# Patient Record
Sex: Female | Born: 1935
Health system: Southern US, Community
[De-identification: ages and names within clinical notes are randomized; demographics above are authoritative.]

## PROBLEM LIST (undated history)

## (undated) DIAGNOSIS — D329 Benign neoplasm of meninges, unspecified: Secondary | ICD-10-CM

## (undated) DIAGNOSIS — R112 Nausea with vomiting, unspecified: Secondary | ICD-10-CM

## (undated) DIAGNOSIS — N289 Disorder of kidney and ureter, unspecified: Secondary | ICD-10-CM

## (undated) DIAGNOSIS — K859 Acute pancreatitis without necrosis or infection, unspecified: Secondary | ICD-10-CM

## (undated) DIAGNOSIS — K5790 Diverticulosis of intestine, part unspecified, without perforation or abscess without bleeding: Secondary | ICD-10-CM

## (undated) DIAGNOSIS — K219 Gastro-esophageal reflux disease without esophagitis: Secondary | ICD-10-CM

## (undated) DIAGNOSIS — Z9889 Other specified postprocedural states: Secondary | ICD-10-CM

## (undated) DIAGNOSIS — K589 Irritable bowel syndrome without diarrhea: Secondary | ICD-10-CM

## (undated) DIAGNOSIS — I10 Essential (primary) hypertension: Secondary | ICD-10-CM

## (undated) DIAGNOSIS — M4807 Spinal stenosis, lumbosacral region: Secondary | ICD-10-CM

## (undated) DIAGNOSIS — M1380 Other specified arthritis, unspecified site: Secondary | ICD-10-CM

## (undated) DIAGNOSIS — N6019 Diffuse cystic mastopathy of unspecified breast: Secondary | ICD-10-CM

## (undated) DIAGNOSIS — Z87442 Personal history of urinary calculi: Secondary | ICD-10-CM

## (undated) DIAGNOSIS — M1990 Unspecified osteoarthritis, unspecified site: Secondary | ICD-10-CM

## (undated) DIAGNOSIS — M8580 Other specified disorders of bone density and structure, unspecified site: Secondary | ICD-10-CM

## (undated) DIAGNOSIS — E7800 Pure hypercholesterolemia, unspecified: Secondary | ICD-10-CM

## (undated) DIAGNOSIS — N200 Calculus of kidney: Secondary | ICD-10-CM

## (undated) DIAGNOSIS — D649 Anemia, unspecified: Secondary | ICD-10-CM

## (undated) HISTORY — PX: TRIGGER FINGER RELEASE: SHX641

## (undated) HISTORY — DX: Calculus of kidney: N20.0

## (undated) HISTORY — DX: Essential (primary) hypertension: I10

## (undated) HISTORY — DX: Other specified disorders of bone density and structure, unspecified site: M85.80

## (undated) HISTORY — DX: Acute pancreatitis without necrosis or infection, unspecified: K85.90

## (undated) HISTORY — DX: Diffuse cystic mastopathy of unspecified breast: N60.19

## (undated) HISTORY — DX: Unspecified osteoarthritis, unspecified site: M19.90

## (undated) HISTORY — DX: Other specified arthritis, unspecified site: M13.80

## (undated) HISTORY — DX: Irritable bowel syndrome, unspecified: K58.9

## (undated) HISTORY — DX: Diverticulosis of intestine, part unspecified, without perforation or abscess without bleeding: K57.90

## (undated) HISTORY — PX: EYE SURGERY: SHX253

## (undated) HISTORY — PX: LITHOTRIPSY: SUR834

## (undated) HISTORY — DX: Gastro-esophageal reflux disease without esophagitis: K21.9

## (undated) HISTORY — DX: Pure hypercholesterolemia, unspecified: E78.00

---

## 1981-05-05 HISTORY — PX: LUMBAR LAMINECTOMY: SHX95

## 1982-05-05 HISTORY — PX: BREAST BIOPSY: SHX20

## 1991-05-06 HISTORY — PX: ABDOMINAL HYSTERECTOMY: SHX81

## 2004-11-11 ENCOUNTER — Ambulatory Visit: Payer: Self-pay | Admitting: Internal Medicine

## 2005-05-05 DIAGNOSIS — K859 Acute pancreatitis without necrosis or infection, unspecified: Secondary | ICD-10-CM

## 2005-05-05 HISTORY — DX: Acute pancreatitis without necrosis or infection, unspecified: K85.90

## 2005-05-20 ENCOUNTER — Ambulatory Visit: Payer: Self-pay | Admitting: Internal Medicine

## 2005-05-28 ENCOUNTER — Ambulatory Visit: Payer: Self-pay | Admitting: Internal Medicine

## 2005-09-16 ENCOUNTER — Ambulatory Visit: Payer: Self-pay | Admitting: Urology

## 2005-10-02 ENCOUNTER — Inpatient Hospital Stay: Payer: Self-pay | Admitting: Unknown Physician Specialty

## 2005-11-17 ENCOUNTER — Ambulatory Visit: Payer: Self-pay | Admitting: Unknown Physician Specialty

## 2005-11-18 ENCOUNTER — Ambulatory Visit: Payer: Self-pay | Admitting: Unknown Physician Specialty

## 2005-12-22 ENCOUNTER — Ambulatory Visit: Payer: Self-pay | Admitting: Internal Medicine

## 2005-12-24 ENCOUNTER — Ambulatory Visit: Payer: Self-pay | Admitting: Internal Medicine

## 2006-02-09 ENCOUNTER — Ambulatory Visit: Payer: Self-pay | Admitting: Unknown Physician Specialty

## 2006-06-29 ENCOUNTER — Ambulatory Visit: Payer: Self-pay | Admitting: Internal Medicine

## 2007-02-01 ENCOUNTER — Ambulatory Visit: Payer: Self-pay | Admitting: Internal Medicine

## 2007-04-21 ENCOUNTER — Ambulatory Visit: Payer: Self-pay | Admitting: Internal Medicine

## 2007-04-23 ENCOUNTER — Ambulatory Visit: Payer: Self-pay | Admitting: Unknown Physician Specialty

## 2008-04-10 ENCOUNTER — Ambulatory Visit: Payer: Self-pay | Admitting: Internal Medicine

## 2008-04-21 ENCOUNTER — Ambulatory Visit: Payer: Self-pay | Admitting: Internal Medicine

## 2008-05-05 HISTORY — PX: BREAST CYST EXCISION: SHX579

## 2008-10-23 ENCOUNTER — Ambulatory Visit: Payer: Self-pay | Admitting: Surgery

## 2008-11-08 ENCOUNTER — Ambulatory Visit: Payer: Self-pay | Admitting: Cardiovascular Disease

## 2008-11-08 ENCOUNTER — Ambulatory Visit: Payer: Self-pay | Admitting: Surgery

## 2008-11-15 ENCOUNTER — Ambulatory Visit: Payer: Self-pay | Admitting: Surgery

## 2009-04-25 ENCOUNTER — Ambulatory Visit: Payer: Self-pay | Admitting: Internal Medicine

## 2010-01-11 ENCOUNTER — Ambulatory Visit: Payer: Self-pay | Admitting: Internal Medicine

## 2010-04-30 ENCOUNTER — Ambulatory Visit: Payer: Self-pay | Admitting: Ophthalmology

## 2010-05-07 ENCOUNTER — Ambulatory Visit: Payer: Self-pay | Admitting: Ophthalmology

## 2010-06-04 ENCOUNTER — Ambulatory Visit: Payer: Self-pay | Admitting: Ophthalmology

## 2010-07-10 ENCOUNTER — Ambulatory Visit: Payer: Self-pay | Admitting: Internal Medicine

## 2010-07-12 ENCOUNTER — Ambulatory Visit: Payer: Self-pay | Admitting: Internal Medicine

## 2011-01-29 ENCOUNTER — Ambulatory Visit: Payer: Self-pay | Admitting: Family Medicine

## 2011-06-30 DIAGNOSIS — M6530 Trigger finger, unspecified finger: Secondary | ICD-10-CM | POA: Diagnosis not present

## 2011-06-30 DIAGNOSIS — M069 Rheumatoid arthritis, unspecified: Secondary | ICD-10-CM | POA: Diagnosis not present

## 2011-07-10 DIAGNOSIS — R110 Nausea: Secondary | ICD-10-CM | POA: Diagnosis not present

## 2011-07-10 DIAGNOSIS — E7800 Pure hypercholesterolemia, unspecified: Secondary | ICD-10-CM | POA: Diagnosis not present

## 2011-07-10 DIAGNOSIS — K5730 Diverticulosis of large intestine without perforation or abscess without bleeding: Secondary | ICD-10-CM | POA: Diagnosis not present

## 2011-07-10 DIAGNOSIS — I10 Essential (primary) hypertension: Secondary | ICD-10-CM | POA: Diagnosis not present

## 2011-07-14 ENCOUNTER — Ambulatory Visit: Payer: Self-pay | Admitting: Internal Medicine

## 2011-07-14 DIAGNOSIS — R928 Other abnormal and inconclusive findings on diagnostic imaging of breast: Secondary | ICD-10-CM | POA: Diagnosis not present

## 2011-07-14 DIAGNOSIS — N6489 Other specified disorders of breast: Secondary | ICD-10-CM | POA: Diagnosis not present

## 2011-07-22 DIAGNOSIS — E7800 Pure hypercholesterolemia, unspecified: Secondary | ICD-10-CM | POA: Diagnosis not present

## 2011-07-22 DIAGNOSIS — M899 Disorder of bone, unspecified: Secondary | ICD-10-CM | POA: Diagnosis not present

## 2011-07-22 DIAGNOSIS — I10 Essential (primary) hypertension: Secondary | ICD-10-CM | POA: Diagnosis not present

## 2011-07-22 DIAGNOSIS — Z1211 Encounter for screening for malignant neoplasm of colon: Secondary | ICD-10-CM | POA: Diagnosis not present

## 2011-08-25 DIAGNOSIS — M069 Rheumatoid arthritis, unspecified: Secondary | ICD-10-CM | POA: Diagnosis not present

## 2011-10-14 DIAGNOSIS — M069 Rheumatoid arthritis, unspecified: Secondary | ICD-10-CM | POA: Diagnosis not present

## 2011-10-14 DIAGNOSIS — Z79899 Other long term (current) drug therapy: Secondary | ICD-10-CM | POA: Diagnosis not present

## 2011-10-27 ENCOUNTER — Ambulatory Visit: Payer: Self-pay | Admitting: Emergency Medicine

## 2011-10-27 DIAGNOSIS — N390 Urinary tract infection, site not specified: Secondary | ICD-10-CM | POA: Diagnosis not present

## 2011-10-27 LAB — URINALYSIS, COMPLETE

## 2011-10-29 LAB — URINE CULTURE

## 2011-11-13 DIAGNOSIS — K5730 Diverticulosis of large intestine without perforation or abscess without bleeding: Secondary | ICD-10-CM | POA: Diagnosis not present

## 2011-11-13 DIAGNOSIS — I10 Essential (primary) hypertension: Secondary | ICD-10-CM | POA: Diagnosis not present

## 2011-11-13 DIAGNOSIS — D649 Anemia, unspecified: Secondary | ICD-10-CM | POA: Diagnosis not present

## 2011-11-13 DIAGNOSIS — E7800 Pure hypercholesterolemia, unspecified: Secondary | ICD-10-CM | POA: Diagnosis not present

## 2011-12-04 DIAGNOSIS — M069 Rheumatoid arthritis, unspecified: Secondary | ICD-10-CM | POA: Diagnosis not present

## 2011-12-04 DIAGNOSIS — M810 Age-related osteoporosis without current pathological fracture: Secondary | ICD-10-CM | POA: Diagnosis not present

## 2011-12-04 DIAGNOSIS — Z79899 Other long term (current) drug therapy: Secondary | ICD-10-CM | POA: Diagnosis not present

## 2012-01-01 DIAGNOSIS — R198 Other specified symptoms and signs involving the digestive system and abdomen: Secondary | ICD-10-CM | POA: Diagnosis not present

## 2012-01-01 DIAGNOSIS — D649 Anemia, unspecified: Secondary | ICD-10-CM | POA: Diagnosis not present

## 2012-01-06 DIAGNOSIS — D649 Anemia, unspecified: Secondary | ICD-10-CM | POA: Diagnosis not present

## 2012-01-27 DIAGNOSIS — M069 Rheumatoid arthritis, unspecified: Secondary | ICD-10-CM | POA: Diagnosis not present

## 2012-01-27 DIAGNOSIS — Z79899 Other long term (current) drug therapy: Secondary | ICD-10-CM | POA: Diagnosis not present

## 2012-02-05 ENCOUNTER — Ambulatory Visit: Payer: Self-pay | Admitting: Unknown Physician Specialty

## 2012-02-05 DIAGNOSIS — K589 Irritable bowel syndrome without diarrhea: Secondary | ICD-10-CM | POA: Diagnosis not present

## 2012-02-05 DIAGNOSIS — R1310 Dysphagia, unspecified: Secondary | ICD-10-CM | POA: Diagnosis not present

## 2012-02-05 DIAGNOSIS — R011 Cardiac murmur, unspecified: Secondary | ICD-10-CM | POA: Diagnosis not present

## 2012-02-05 DIAGNOSIS — Z888 Allergy status to other drugs, medicaments and biological substances status: Secondary | ICD-10-CM | POA: Diagnosis not present

## 2012-02-05 DIAGNOSIS — K648 Other hemorrhoids: Secondary | ICD-10-CM | POA: Diagnosis not present

## 2012-02-05 DIAGNOSIS — N6019 Diffuse cystic mastopathy of unspecified breast: Secondary | ICD-10-CM | POA: Diagnosis not present

## 2012-02-05 DIAGNOSIS — I10 Essential (primary) hypertension: Secondary | ICD-10-CM | POA: Diagnosis not present

## 2012-02-05 DIAGNOSIS — K222 Esophageal obstruction: Secondary | ICD-10-CM | POA: Diagnosis not present

## 2012-02-05 DIAGNOSIS — Z8041 Family history of malignant neoplasm of ovary: Secondary | ICD-10-CM | POA: Diagnosis not present

## 2012-02-05 DIAGNOSIS — M069 Rheumatoid arthritis, unspecified: Secondary | ICD-10-CM | POA: Diagnosis not present

## 2012-02-05 DIAGNOSIS — K5730 Diverticulosis of large intestine without perforation or abscess without bleeding: Secondary | ICD-10-CM | POA: Diagnosis not present

## 2012-02-05 DIAGNOSIS — Z79899 Other long term (current) drug therapy: Secondary | ICD-10-CM | POA: Diagnosis not present

## 2012-02-05 DIAGNOSIS — E785 Hyperlipidemia, unspecified: Secondary | ICD-10-CM | POA: Diagnosis not present

## 2012-02-05 DIAGNOSIS — M810 Age-related osteoporosis without current pathological fracture: Secondary | ICD-10-CM | POA: Diagnosis not present

## 2012-02-05 DIAGNOSIS — K219 Gastro-esophageal reflux disease without esophagitis: Secondary | ICD-10-CM | POA: Diagnosis not present

## 2012-02-05 DIAGNOSIS — R198 Other specified symptoms and signs involving the digestive system and abdomen: Secondary | ICD-10-CM | POA: Diagnosis not present

## 2012-02-05 LAB — HM COLONOSCOPY

## 2012-02-16 DIAGNOSIS — Z23 Encounter for immunization: Secondary | ICD-10-CM | POA: Diagnosis not present

## 2012-03-30 ENCOUNTER — Encounter: Payer: Self-pay | Admitting: Internal Medicine

## 2012-03-30 ENCOUNTER — Ambulatory Visit (INDEPENDENT_AMBULATORY_CARE_PROVIDER_SITE_OTHER): Payer: Medicare Other | Admitting: Internal Medicine

## 2012-03-30 ENCOUNTER — Encounter: Payer: Self-pay | Admitting: *Deleted

## 2012-03-30 VITALS — BP 132/62 | HR 66 | Temp 98.1°F | Ht 62.0 in | Wt 130.0 lb

## 2012-03-30 DIAGNOSIS — M8580 Other specified disorders of bone density and structure, unspecified site: Secondary | ICD-10-CM | POA: Insufficient documentation

## 2012-03-30 DIAGNOSIS — M899 Disorder of bone, unspecified: Secondary | ICD-10-CM | POA: Diagnosis not present

## 2012-03-30 DIAGNOSIS — M064 Inflammatory polyarthropathy: Secondary | ICD-10-CM | POA: Diagnosis not present

## 2012-03-30 DIAGNOSIS — I10 Essential (primary) hypertension: Secondary | ICD-10-CM | POA: Diagnosis not present

## 2012-03-30 DIAGNOSIS — M069 Rheumatoid arthritis, unspecified: Secondary | ICD-10-CM | POA: Insufficient documentation

## 2012-03-30 DIAGNOSIS — M949 Disorder of cartilage, unspecified: Secondary | ICD-10-CM

## 2012-03-30 DIAGNOSIS — E7800 Pure hypercholesterolemia, unspecified: Secondary | ICD-10-CM | POA: Insufficient documentation

## 2012-03-30 DIAGNOSIS — K5790 Diverticulosis of intestine, part unspecified, without perforation or abscess without bleeding: Secondary | ICD-10-CM

## 2012-03-30 DIAGNOSIS — Z79899 Other long term (current) drug therapy: Secondary | ICD-10-CM | POA: Diagnosis not present

## 2012-03-30 DIAGNOSIS — M1990 Unspecified osteoarthritis, unspecified site: Secondary | ICD-10-CM

## 2012-03-30 DIAGNOSIS — K5730 Diverticulosis of large intestine without perforation or abscess without bleeding: Secondary | ICD-10-CM | POA: Diagnosis not present

## 2012-03-30 DIAGNOSIS — K219 Gastro-esophageal reflux disease without esophagitis: Secondary | ICD-10-CM

## 2012-03-30 NOTE — Progress Notes (Signed)
Subjective:    Patient ID: Stacey Snyder, female    DOB: 09/24/1932, 78 y.o.   MRN: [id number]  HPI 78 year old female with past history of hypertension, hypercholesterolemia, inflammatory arthritis and previous hospitalization for pancreatitis s/p ERCP who comes in today for a scheduled follow up.  She states she is doing well.  Is receiving Remicade.  Having problems with her left third finger and right thumb - locking.  Increased joint pain in these areas.  Plans to discuss with Dr Gavin Potters.  Bowels are doing well.  Colonoscopy/EGD recently performed by Dr Markham Jordan - "ok".  These were done for a work up for her anemia.  She is taking metamucil and miralax.  Doing well.    Past Medical History  Diagnosis Date  . Hypertension   . Hypercholesterolemia   . Nephrolithiasis   . Fibrocystic breast disease   . Diverticulosis   . Pancreatitis 2007    s/p ERCP  . IBS (irritable bowel syndrome)   . Inflammatory arthritis     positive anti-CCP abs, s/p prednisone, MTX, Remicade  . Osteopenia     GI upset with Fosamax  . GERD (gastroesophageal reflux disease)   . Hypertension     Outpatient Encounter Prescriptions as of 03/30/2012  Medication Sig Dispense Refill  . calcium carbonate (OS-CAL) 600 MG TABS Take 600 mg by mouth daily.      Marland Kitchen diltiazem (DILACOR XR) 240 MG 24 hr capsule Take 240 mg by mouth daily.      . folic acid (FOLVITE) 1 MG tablet Take 1 mg by mouth 2 (two) times daily.       . InFLIXimab (REMICADE IV) Inject into the vein. Per Dr Gavin Potters      . losartan (COZAAR) 100 MG tablet Take 100 mg by mouth daily.      Marland Kitchen lovastatin (MEVACOR) 40 MG tablet Take 40 mg by mouth daily.      . methotrexate 25 MG/ML SOLN Administer .6 mls subcutaneously once a week      . omeprazole (PRILOSEC) 20 MG capsule Take 20 mg by mouth daily.      Marland Kitchen triamterene-hydrochlorothiazide (MAXZIDE-25) 37.5-25 MG per tablet Take 1 tablet by mouth daily.      . [DISCONTINUED] Calcium Ascorbate 500 MG TABS  Take 1 tablet by mouth daily.      Marland Kitchen aspirin 81 MG tablet Take 81 mg by mouth daily.      . fish oil-omega-3 fatty acids 1000 MG capsule One capsule daily        Review of Systems Patient denies any headache, lightheadedness or dizziness.  No significant sinus or allergy symptoms.  No chest pain, tightness or palpitations.  No increased shortness of breath, cough or congestion.  No nausea or vomiting.  No abdominal pain or cramping.  No bowel change, such as diarrhea, constipation, BRBPR or melana.  No urine change.        Objective:   Physical Exam Filed Vitals:   03/30/12 1026  BP: 132/62  Pulse: 66  Temp: 98.1 F (31.74 C)   78 year old female in no acute distress.   HEENT:  Nares - clear.  OP- without lesions or erythema.  NECK:  Supple, nontender.  No audible bruit.   HEART:  Appears to be regular. LUNGS:  Without crackles or wheezing audible.  Respirations even and unlabored.   RADIAL PULSE:  Equal bilaterally.  ABDOMEN:  Soft, nontender.  No audible abdominal bruit.   EXTREMITIES:  No  increased edema to be present.                     Assessment & Plan:  CARDIOVASCULAR.  Asymptomatic.  Continue risk factor modification.    PREVIOUS ABNORMAL MAMMOGRAM.  Saw Dr Katrinka Blazing and had biopsy 11/15/08.  Benign.  Had follow up mammogram 07/10/10.  Follow up views recommended.  These were done on 07/12/10.  Birads III.  Recommended follow up left breast mammo - 01/29/11 - Birads III.  Follow up bilateral mammogram 07/17/11 - BiRADS II.    PREVIOUS MILD RENAL INSUFFICIENCY.  Follow metabolic panel.    HEALTH MAINTENANCE.  Physical 07/10/11.  Mammogram as outlined.  Recently had a colonoscopy.  Obtain results.

## 2012-03-30 NOTE — Patient Instructions (Addendum)
It was nice seeing you today.  I am glad you have been doing well.  Let me know if you need anything. 

## 2012-04-03 ENCOUNTER — Encounter: Payer: Self-pay | Admitting: Internal Medicine

## 2012-04-03 DIAGNOSIS — K219 Gastro-esophageal reflux disease without esophagitis: Secondary | ICD-10-CM | POA: Insufficient documentation

## 2012-04-03 NOTE — Assessment & Plan Note (Signed)
Symptoms controlled on Omeprazole.  Follow.   

## 2012-04-03 NOTE — Assessment & Plan Note (Signed)
Calcium and vitamin D.  Follow.  Continue weight bearing exercise.      

## 2012-04-03 NOTE — Assessment & Plan Note (Signed)
On Lovastatin.  Check lipid panel and liver function with next labs.

## 2012-04-03 NOTE — Assessment & Plan Note (Signed)
Blood pressure under good control.  Same meds.  Check metabolic panel with next labs.    

## 2012-04-03 NOTE — Assessment & Plan Note (Signed)
Bowels doing well now.  Taking miralax regularly.  Follow.  Obtain results of recent colonoscopy.

## 2012-04-03 NOTE — Assessment & Plan Note (Signed)
Receiving Remicade.  Sees Dr Kernodle.  Follow.    

## 2012-04-08 DIAGNOSIS — K219 Gastro-esophageal reflux disease without esophagitis: Secondary | ICD-10-CM

## 2012-05-24 DIAGNOSIS — M069 Rheumatoid arthritis, unspecified: Secondary | ICD-10-CM | POA: Diagnosis not present

## 2012-05-24 DIAGNOSIS — Z79899 Other long term (current) drug therapy: Secondary | ICD-10-CM | POA: Diagnosis not present

## 2012-05-24 DIAGNOSIS — M6530 Trigger finger, unspecified finger: Secondary | ICD-10-CM | POA: Diagnosis not present

## 2012-07-12 ENCOUNTER — Encounter: Payer: Self-pay | Admitting: Internal Medicine

## 2012-07-12 ENCOUNTER — Ambulatory Visit (INDEPENDENT_AMBULATORY_CARE_PROVIDER_SITE_OTHER): Payer: Medicare Other | Admitting: Internal Medicine

## 2012-07-12 VITALS — BP 110/56 | HR 64 | Temp 97.8°F | Ht 62.0 in | Wt 130.8 lb

## 2012-07-12 DIAGNOSIS — M1380 Other specified arthritis, unspecified site: Secondary | ICD-10-CM

## 2012-07-12 DIAGNOSIS — Z1239 Encounter for other screening for malignant neoplasm of breast: Secondary | ICD-10-CM

## 2012-07-12 DIAGNOSIS — M064 Inflammatory polyarthropathy: Secondary | ICD-10-CM

## 2012-07-12 DIAGNOSIS — K219 Gastro-esophageal reflux disease without esophagitis: Secondary | ICD-10-CM

## 2012-07-12 DIAGNOSIS — K5730 Diverticulosis of large intestine without perforation or abscess without bleeding: Secondary | ICD-10-CM | POA: Diagnosis not present

## 2012-07-12 DIAGNOSIS — I10 Essential (primary) hypertension: Secondary | ICD-10-CM

## 2012-07-12 DIAGNOSIS — E7800 Pure hypercholesterolemia, unspecified: Secondary | ICD-10-CM

## 2012-07-12 DIAGNOSIS — L989 Disorder of the skin and subcutaneous tissue, unspecified: Secondary | ICD-10-CM

## 2012-07-12 DIAGNOSIS — M1990 Unspecified osteoarthritis, unspecified site: Secondary | ICD-10-CM

## 2012-07-12 DIAGNOSIS — K5790 Diverticulosis of intestine, part unspecified, without perforation or abscess without bleeding: Secondary | ICD-10-CM

## 2012-07-12 DIAGNOSIS — M899 Disorder of bone, unspecified: Secondary | ICD-10-CM

## 2012-07-12 DIAGNOSIS — M8580 Other specified disorders of bone density and structure, unspecified site: Secondary | ICD-10-CM

## 2012-07-12 MED ORDER — LOVASTATIN 40 MG PO TABS: 40.0000 mg | ORAL_TABLET | Freq: Every day | ORAL | Status: DC

## 2012-07-12 MED ORDER — FLUTICASONE PROPIONATE 50 MCG/ACT NA SUSP: 2.0000 | Freq: Every day | NASAL | Status: DC

## 2012-07-12 MED ORDER — TRIAMTERENE-HCTZ 37.5-25 MG PO TABS: 1.0000 | ORAL_TABLET | Freq: Every day | ORAL | Status: DC

## 2012-07-12 MED ORDER — DILTIAZEM HCL ER 240 MG PO CP24: 240.0000 mg | ORAL_CAPSULE | Freq: Every day | ORAL | Status: DC

## 2012-07-12 MED ORDER — OMEPRAZOLE 20 MG PO CPDR: 20.0000 mg | DELAYED_RELEASE_CAPSULE | Freq: Every day | ORAL | Status: DC

## 2012-07-12 MED ORDER — LOSARTAN POTASSIUM 100 MG PO TABS: 100.0000 mg | ORAL_TABLET | Freq: Every day | ORAL | Status: DC

## 2012-07-13 ENCOUNTER — Encounter: Payer: Self-pay | Admitting: Internal Medicine

## 2012-07-13 NOTE — Assessment & Plan Note (Signed)
Symptoms controlled on Omeprazole.  Follow.   

## 2012-07-13 NOTE — Assessment & Plan Note (Signed)
Bowels doing well now.  No recent flares.  Had recent colonoscopy.

## 2012-07-13 NOTE — Progress Notes (Signed)
Subjective:    Patient ID: Stacey Snyder, female    DOB: May 17, 1932, 79 y.o.   MRN: [id number]  HPI 79 year old female with past history of hypertension, hypercholesterolemia, inflammatory arthritis and previous hospitalization for pancreatitis s/p ERCP who comes in today to follow up on these issues as well as for a complete physical exam.  She states she is doing well.  Is receiving Remicade.  Having problems with her left third finger - locking.  Increased joint pain in these areas.  Sees Dr Gavin Potters.  He offered to inject.  She declines. Wearing tape on her finger and this helps.  Bowels are doing well.  Colonoscopy/EGD recently performed by Dr Markham Jordan - "ok".  These were done for a work up for her anemia.  She is taking metamucil and miralax.  Doing well.     Past Medical History  Diagnosis Date  . Hypertension   . Hypercholesterolemia   . Nephrolithiasis   . Fibrocystic breast disease   . Diverticulosis   . Pancreatitis 2007    s/p ERCP  . IBS (irritable bowel syndrome)   . Inflammatory arthritis     positive anti-CCP abs, s/p prednisone, MTX, Remicade  . Osteopenia     GI upset with Fosamax  . GERD (gastroesophageal reflux disease)   . Hypertension     Outpatient Encounter Prescriptions as of 07/12/2012  Medication Sig Dispense Refill  . aspirin 81 MG tablet Take 81 mg by mouth daily.      . calcium carbonate (OS-CAL) 600 MG TABS Take 600 mg by mouth daily.      Marland Kitchen diltiazem (DILACOR XR) 240 MG 24 hr capsule Take 1 capsule (240 mg total) by mouth daily.  90 capsule  3  . fish oil-omega-3 fatty acids 1000 MG capsule One capsule daily      . folic acid (FOLVITE) 1 MG tablet Take 1 mg by mouth 2 (two) times daily.       . InFLIXimab (REMICADE IV) Inject into the vein. Per Dr Gavin Potters      . losartan (COZAAR) 100 MG tablet Take 1 tablet (100 mg total) by mouth daily.  90 tablet  3  . lovastatin (MEVACOR) 40 MG tablet Take 1 tablet (40 mg total) by mouth daily.  90 tablet  3   . methotrexate 25 MG/ML SOLN Administer .6 mls subcutaneously once a week      . omeprazole (PRILOSEC) 20 MG capsule Take 1 capsule (20 mg total) by mouth daily.  90 capsule  3  . triamterene-hydrochlorothiazide (MAXZIDE-25) 37.5-25 MG per tablet Take 1 each (1 tablet total) by mouth daily.  90 tablet  3  . [DISCONTINUED] diltiazem (DILACOR XR) 240 MG 24 hr capsule Take 240 mg by mouth daily.      . [DISCONTINUED] losartan (COZAAR) 100 MG tablet Take 100 mg by mouth daily.      . [DISCONTINUED] lovastatin (MEVACOR) 40 MG tablet Take 40 mg by mouth daily.      . [DISCONTINUED] omeprazole (PRILOSEC) 20 MG capsule Take 20 mg by mouth daily.      . [DISCONTINUED] triamterene-hydrochlorothiazide (MAXZIDE-25) 37.5-25 MG per tablet Take 1 tablet by mouth daily.      . fluticasone (FLONASE) 50 MCG/ACT nasal spray Place 2 sprays into the nose daily.  16 g  2   No facility-administered encounter medications on file as of 07/12/2012.    Review of Systems Patient denies any headache, lightheadedness or dizziness.  No significant sinus or allergy  symptoms.  No chest pain, tightness or palpitations.  No increased shortness of breath, cough or congestion.  No nausea or vomiting.  No acid reflux.  On omeprazole and doing well.  No abdominal pain or cramping.  No bowel change, such as diarrhea, constipation, BRBPR or melana.  States her bowels are doing well.  No urine change.  Has a left forearm lesion.  Persistent.       Objective:   Physical Exam  Filed Vitals:   07/12/12 1034  BP: 110/56  Pulse: 64  Temp: 97.8 F (36.6 C)   Blood pressure recheck:  120/62, pulse 89  79 year old female in no acute distress.   HEENT:  Nares- clear.  Oropharynx - without lesions. NECK:  Supple.  Nontender.  No audible bruit.  HEART:  Appears to be regular. LUNGS:  No crackles or wheezing audible.  Respirations even and unlabored.  RADIAL PULSE:  Equal bilaterally.    BREASTS:  No nipple discharge or nipple  retraction present.  Could not appreciate any distinct nodules or axillary adenopathy.  ABDOMEN:  Soft, nontender.  Bowel sounds present and normal.  No audible abdominal bruit.  GU:  She declined.    EXTREMITIES:  No increased edema present.  DP pulses palpable and equal bilaterally.      SKIN:  Small, circular, raised lesion left forearm.        Assessment & Plan:  CARDIOVASCULAR.  Asymptomatic.  Continue risk factor modification.    PREVIOUS ABNORMAL MAMMOGRAM.  Saw Dr Katrinka Blazing and had biopsy 11/15/08.  Benign.  Had follow up mammogram 07/10/10.  Follow up views recommended.  These were done on 07/12/10.  Birads III.  Recommended follow up left breast mammo - 01/29/11 - Birads III.  Follow up bilateral mammogram 07/17/11 - BiRADS II.  Schedule follow up mammogram.    PREVIOUS MILD RENAL INSUFFICIENCY.  Follow metabolic panel.    HEALTH MAINTENANCE.  Physical today.  Mammogram as outlined.  Recently had a colonoscopy.  She declined pelvic.  S/p hysterectomy.

## 2012-07-13 NOTE — Assessment & Plan Note (Signed)
On Lovastatin.  Check lipid panel and liver function with next labs.  Will have these drawn with her next labs - with Dr Gavin Potters.

## 2012-07-13 NOTE — Assessment & Plan Note (Signed)
Blood pressure under good control.  Same meds.  Check metabolic panel with next labs.

## 2012-07-13 NOTE — Assessment & Plan Note (Signed)
Calcium and vitamin D.  Follow.  Continue weight bearing exercise.

## 2012-07-13 NOTE — Assessment & Plan Note (Signed)
Receiving Remicade.  Sees Dr Gavin Potters.  Follow.

## 2012-07-15 DIAGNOSIS — Z79899 Other long term (current) drug therapy: Secondary | ICD-10-CM | POA: Diagnosis not present

## 2012-07-15 DIAGNOSIS — E785 Hyperlipidemia, unspecified: Secondary | ICD-10-CM | POA: Diagnosis not present

## 2012-07-15 DIAGNOSIS — M069 Rheumatoid arthritis, unspecified: Secondary | ICD-10-CM | POA: Diagnosis not present

## 2012-07-30 ENCOUNTER — Ambulatory Visit: Payer: Self-pay | Admitting: Internal Medicine

## 2012-07-30 DIAGNOSIS — Z1231 Encounter for screening mammogram for malignant neoplasm of breast: Secondary | ICD-10-CM | POA: Diagnosis not present

## 2012-08-02 ENCOUNTER — Encounter: Payer: Self-pay | Admitting: Internal Medicine

## 2012-08-02 DIAGNOSIS — Z0189 Encounter for other specified special examinations: Secondary | ICD-10-CM | POA: Diagnosis not present

## 2012-08-02 DIAGNOSIS — L578 Other skin changes due to chronic exposure to nonionizing radiation: Secondary | ICD-10-CM | POA: Diagnosis not present

## 2012-08-02 DIAGNOSIS — L570 Actinic keratosis: Secondary | ICD-10-CM | POA: Diagnosis not present

## 2012-08-02 DIAGNOSIS — Z85828 Personal history of other malignant neoplasm of skin: Secondary | ICD-10-CM | POA: Diagnosis not present

## 2012-08-17 ENCOUNTER — Encounter: Payer: Self-pay | Admitting: Internal Medicine

## 2012-09-06 DIAGNOSIS — Z79899 Other long term (current) drug therapy: Secondary | ICD-10-CM | POA: Diagnosis not present

## 2012-09-06 DIAGNOSIS — M6530 Trigger finger, unspecified finger: Secondary | ICD-10-CM | POA: Diagnosis not present

## 2012-09-06 DIAGNOSIS — M069 Rheumatoid arthritis, unspecified: Secondary | ICD-10-CM | POA: Diagnosis not present

## 2012-09-06 DIAGNOSIS — M064 Inflammatory polyarthropathy: Secondary | ICD-10-CM | POA: Diagnosis not present

## 2012-10-28 DIAGNOSIS — Z79899 Other long term (current) drug therapy: Secondary | ICD-10-CM | POA: Diagnosis not present

## 2012-10-28 DIAGNOSIS — M069 Rheumatoid arthritis, unspecified: Secondary | ICD-10-CM | POA: Diagnosis not present

## 2012-11-11 ENCOUNTER — Encounter: Payer: Self-pay | Admitting: Internal Medicine

## 2012-11-11 ENCOUNTER — Ambulatory Visit (INDEPENDENT_AMBULATORY_CARE_PROVIDER_SITE_OTHER): Payer: Medicare Other | Admitting: Internal Medicine

## 2012-11-11 VITALS — BP 120/60 | HR 59 | Temp 98.2°F | Ht 62.0 in | Wt 128.0 lb

## 2012-11-11 DIAGNOSIS — K5730 Diverticulosis of large intestine without perforation or abscess without bleeding: Secondary | ICD-10-CM | POA: Diagnosis not present

## 2012-11-11 DIAGNOSIS — M899 Disorder of bone, unspecified: Secondary | ICD-10-CM

## 2012-11-11 DIAGNOSIS — K5790 Diverticulosis of intestine, part unspecified, without perforation or abscess without bleeding: Secondary | ICD-10-CM

## 2012-11-11 DIAGNOSIS — I10 Essential (primary) hypertension: Secondary | ICD-10-CM | POA: Diagnosis not present

## 2012-11-11 DIAGNOSIS — M1990 Unspecified osteoarthritis, unspecified site: Secondary | ICD-10-CM

## 2012-11-11 DIAGNOSIS — M064 Inflammatory polyarthropathy: Secondary | ICD-10-CM

## 2012-11-11 DIAGNOSIS — M8580 Other specified disorders of bone density and structure, unspecified site: Secondary | ICD-10-CM

## 2012-11-11 DIAGNOSIS — K219 Gastro-esophageal reflux disease without esophagitis: Secondary | ICD-10-CM

## 2012-11-11 DIAGNOSIS — E7800 Pure hypercholesterolemia, unspecified: Secondary | ICD-10-CM

## 2012-11-14 ENCOUNTER — Encounter: Payer: Self-pay | Admitting: Internal Medicine

## 2012-11-14 NOTE — Progress Notes (Signed)
Subjective:    Patient ID: Stacey Snyder, female    DOB: 05/27/1932, 79 y.o.   MRN: [id number]  HPI 79 year old female with past history of hypertension, hypercholesterolemia, inflammatory arthritis and previous hospitalization for pancreatitis s/p ERCP who comes in today for a scheduled follow up.  She states she is doing well.  Is receiving Remicade.  Still having problems with her left third finger - locking.  Increased joint pain in these areas.  Sees Dr Gavin Potters.  He offered to inject.  She declines. Bowels are doing well.  Colonoscopy/EGD recently performed by Dr Markham Jordan - "ok".  These were done for a work up for her anemia.  She is taking metamucil and miralax.  Doing well.  Still with some occasional flares.  States she had her last flare last month.   Lasted 3-4 days.  Doing fine now.  Desires no further intervention.    Past Medical History  Diagnosis Date  . Hypertension   . Hypercholesterolemia   . Nephrolithiasis   . Fibrocystic breast disease   . Diverticulosis   . Pancreatitis 2007    s/p ERCP  . IBS (irritable bowel syndrome)   . Inflammatory arthritis     positive anti-CCP abs, s/p prednisone, MTX, Remicade  . Osteopenia     GI upset with Fosamax  . GERD (gastroesophageal reflux disease)   . Hypertension     Outpatient Encounter Prescriptions as of 11/11/2012  Medication Sig Dispense Refill  . aspirin 81 MG tablet Take 81 mg by mouth daily.      . calcium carbonate (OS-CAL) 600 MG TABS Take 600 mg by mouth daily.      . Cholecalciferol (VITAMIN D) 2000 UNITS tablet Take 2,000 Units by mouth daily.      Marland Kitchen diltiazem (DILACOR XR) 240 MG 24 hr capsule Take 1 capsule (240 mg total) by mouth daily.  90 capsule  3  . fish oil-omega-3 fatty acids 1000 MG capsule One capsule daily      . fluticasone (FLONASE) 50 MCG/ACT nasal spray Place 2 sprays into the nose daily.  16 g  2  . folic acid (FOLVITE) 1 MG tablet Take 1 mg by mouth 2 (two) times daily.       . InFLIXimab  (REMICADE IV) Inject into the vein. Per Dr Gavin Potters      . losartan (COZAAR) 100 MG tablet Take 1 tablet (100 mg total) by mouth daily.  90 tablet  3  . lovastatin (MEVACOR) 40 MG tablet Take 1 tablet (40 mg total) by mouth daily.  90 tablet  3  . methotrexate 25 MG/ML SOLN Administer .6 mls subcutaneously once a week      . omeprazole (PRILOSEC) 20 MG capsule Take 1 capsule (20 mg total) by mouth daily.  90 capsule  3  . triamterene-hydrochlorothiazide (MAXZIDE-25) 37.5-25 MG per tablet Take 1 each (1 tablet total) by mouth daily.  90 tablet  3   No facility-administered encounter medications on file as of 11/11/2012.    Review of Systems Patient denies any headache, lightheadedness or dizziness.  No significant sinus or allergy symptoms.  No chest pain, tightness or palpitations.  No increased shortness of breath, cough or congestion.  No nausea or vomiting.  No acid reflux.  On omeprazole and doing well.  No abdominal pain or cramping.  No bowel change, such as diarrhea, constipation, BRBPR or melana.  States her bowels are doing well now.  Desires no further intervenion.   No  urine change.       Objective:   Physical Exam  Filed Vitals:   11/11/12 0917  BP: 120/60  Pulse: 59  Temp: 98.2 F (36.8 C)   pulse 59  79 year old female in no acute distress.   HEENT:  Nares- clear.  Oropharynx - without lesions. NECK:  Supple.  Nontender.  No audible bruit.  HEART:  Appears to be regular. LUNGS:  No crackles or wheezing audible.  Respirations even and unlabored.  RADIAL PULSE:  Equal bilaterally. ABDOMEN:  Soft, nontender.  Bowel sounds present and normal.  No audible abdominal bruit.    EXTREMITIES:  No increased edema present.  DP pulses palpable and equal bilaterally.        Assessment & Plan:  CARDIOVASCULAR.  Asymptomatic.  Continue risk factor modification.    PREVIOUS ABNORMAL MAMMOGRAM.  Saw Dr Katrinka Blazing and had biopsy 11/15/08.  Benign.  Had follow up mammogram 07/10/10.  Follow  up views recommended.  These were done on 07/12/10.  Birads III.  Recommended follow up left breast mammo - 01/29/11 - Birads III.  Follow up bilateral mammogram 07/30/12 - BiRADS II.   PREVIOUS MILD RENAL INSUFFICIENCY.  Follow metabolic panel.    HEALTH MAINTENANCE.  Physical 07/12/12.  Mammogram as outlined.  Recently had a colonoscopy.  She declined pelvic.  S/p hysterectomy.

## 2012-11-14 NOTE — Assessment & Plan Note (Signed)
Blood pressure under good control.  Same meds.  Follow metabolic panel.   

## 2012-11-14 NOTE — Assessment & Plan Note (Signed)
Receiving Remicade.  Sees Dr Gavin Potters.  Follow.

## 2012-11-14 NOTE — Assessment & Plan Note (Signed)
Calcium and vitamin D.  Follow.  Continue weight bearing exercise.

## 2012-11-14 NOTE — Assessment & Plan Note (Signed)
Symptoms controlled on Omeprazole.  Follow.   

## 2012-11-14 NOTE — Assessment & Plan Note (Signed)
Bowels doing well now.  Had recent colonoscopy.  Last flare last month.  Desires no further w/up.  Follow.

## 2012-11-14 NOTE — Assessment & Plan Note (Signed)
On Lovastatin.  Follow lipid panel and liver function.  Last cholesterol check wnl.     

## 2012-12-20 DIAGNOSIS — Z79899 Other long term (current) drug therapy: Secondary | ICD-10-CM | POA: Diagnosis not present

## 2012-12-20 DIAGNOSIS — D649 Anemia, unspecified: Secondary | ICD-10-CM | POA: Diagnosis not present

## 2012-12-20 DIAGNOSIS — M069 Rheumatoid arthritis, unspecified: Secondary | ICD-10-CM | POA: Diagnosis not present

## 2013-01-25 DIAGNOSIS — Z23 Encounter for immunization: Secondary | ICD-10-CM | POA: Diagnosis not present

## 2013-02-14 DIAGNOSIS — M069 Rheumatoid arthritis, unspecified: Secondary | ICD-10-CM | POA: Diagnosis not present

## 2013-02-14 DIAGNOSIS — Z79899 Other long term (current) drug therapy: Secondary | ICD-10-CM | POA: Diagnosis not present

## 2013-04-11 DIAGNOSIS — E785 Hyperlipidemia, unspecified: Secondary | ICD-10-CM | POA: Diagnosis not present

## 2013-04-11 DIAGNOSIS — Z79899 Other long term (current) drug therapy: Secondary | ICD-10-CM | POA: Diagnosis not present

## 2013-04-11 DIAGNOSIS — M069 Rheumatoid arthritis, unspecified: Secondary | ICD-10-CM | POA: Diagnosis not present

## 2013-04-11 DIAGNOSIS — M79609 Pain in unspecified limb: Secondary | ICD-10-CM | POA: Diagnosis not present

## 2013-04-11 DIAGNOSIS — M25519 Pain in unspecified shoulder: Secondary | ICD-10-CM | POA: Diagnosis not present

## 2013-04-14 ENCOUNTER — Ambulatory Visit (INDEPENDENT_AMBULATORY_CARE_PROVIDER_SITE_OTHER): Payer: Medicare Other | Admitting: Internal Medicine

## 2013-04-14 ENCOUNTER — Encounter: Payer: Self-pay | Admitting: Internal Medicine

## 2013-04-14 VITALS — BP 140/70 | HR 70 | Temp 97.8°F | Ht 62.0 in | Wt 130.2 lb

## 2013-04-14 DIAGNOSIS — M899 Disorder of bone, unspecified: Secondary | ICD-10-CM | POA: Diagnosis not present

## 2013-04-14 DIAGNOSIS — M25552 Pain in left hip: Secondary | ICD-10-CM

## 2013-04-14 DIAGNOSIS — E7800 Pure hypercholesterolemia, unspecified: Secondary | ICD-10-CM

## 2013-04-14 DIAGNOSIS — M1990 Unspecified osteoarthritis, unspecified site: Secondary | ICD-10-CM

## 2013-04-14 DIAGNOSIS — K5730 Diverticulosis of large intestine without perforation or abscess without bleeding: Secondary | ICD-10-CM | POA: Diagnosis not present

## 2013-04-14 DIAGNOSIS — K219 Gastro-esophageal reflux disease without esophagitis: Secondary | ICD-10-CM

## 2013-04-14 DIAGNOSIS — I10 Essential (primary) hypertension: Secondary | ICD-10-CM

## 2013-04-14 DIAGNOSIS — K5790 Diverticulosis of intestine, part unspecified, without perforation or abscess without bleeding: Secondary | ICD-10-CM

## 2013-04-14 DIAGNOSIS — M25559 Pain in unspecified hip: Secondary | ICD-10-CM

## 2013-04-14 DIAGNOSIS — M8580 Other specified disorders of bone density and structure, unspecified site: Secondary | ICD-10-CM

## 2013-04-14 DIAGNOSIS — M064 Inflammatory polyarthropathy: Secondary | ICD-10-CM | POA: Diagnosis not present

## 2013-04-14 DIAGNOSIS — M1380 Other specified arthritis, unspecified site: Secondary | ICD-10-CM

## 2013-04-14 MED ORDER — TRIAMTERENE-HCTZ 37.5-25 MG PO TABS: 1.0000 | ORAL_TABLET | Freq: Every day | ORAL | Status: DC

## 2013-04-14 MED ORDER — DILTIAZEM HCL ER 240 MG PO CP24: 240.0000 mg | ORAL_CAPSULE | Freq: Every day | ORAL | Status: DC

## 2013-04-14 MED ORDER — LOSARTAN POTASSIUM 100 MG PO TABS: 100.0000 mg | ORAL_TABLET | Freq: Every day | ORAL | Status: DC

## 2013-04-14 MED ORDER — OMEPRAZOLE 20 MG PO CPDR: 20.0000 mg | DELAYED_RELEASE_CAPSULE | Freq: Every day | ORAL | Status: DC

## 2013-04-14 MED ORDER — FLUTICASONE PROPIONATE 50 MCG/ACT NA SUSP: 2.0000 | Freq: Every day | NASAL | Status: DC

## 2013-04-14 MED ORDER — LOVASTATIN 40 MG PO TABS: 40.0000 mg | ORAL_TABLET | Freq: Every day | ORAL | Status: DC

## 2013-04-14 NOTE — Progress Notes (Signed)
Pre-visit discussion using our clinic review tool. No additional management support is needed unless otherwise documented below in the visit note.  

## 2013-04-14 NOTE — Progress Notes (Signed)
Subjective:    Patient ID: Stacey Snyder, female    DOB: Apr 04, 1933, 79 y.o.   MRN: [id number]  HPI 79 year old female with past history of hypertension, hypercholesterolemia, inflammatory arthritis and previous hospitalization for pancreatitis s/p ERCP who comes in today for a scheduled follow up.  She states she is doing well.  Is receiving Remicade.  Sees Dr Gavin Potters.  Back on MTX.  Started having left hip pain in 11/14.  Saw Dr Gavin Potters 12.8/14.  Is s/p injection.  Helped.  Is better.  Able to walk better.  Previously a prednisone dose pack - did not help.  Bowels are doing well.  Colonoscopy/EGD recently performed by Dr Markham Jordan - "ok".  These were done for a work up for her anemia.  She is taking metamucil and miralax.  Doing well.  Taking tylenol.  Hands better.     Past Medical History  Diagnosis Date  . Hypertension   . Hypercholesterolemia   . Nephrolithiasis   . Fibrocystic breast disease   . Diverticulosis   . Pancreatitis 2007    s/p ERCP  . IBS (irritable bowel syndrome)   . Inflammatory arthritis     positive anti-CCP abs, s/p prednisone, MTX, Remicade  . Osteopenia     GI upset with Fosamax  . GERD (gastroesophageal reflux disease)   . Hypertension     Outpatient Encounter Prescriptions as of 04/14/2013  Medication Sig  . Cholecalciferol (VITAMIN D) 2000 UNITS tablet Take 2,000 Units by mouth daily.  Marland Kitchen diltiazem (DILACOR XR) 240 MG 24 hr capsule Take 1 capsule (240 mg total) by mouth daily.  . fish oil-omega-3 fatty acids 1000 MG capsule One capsule daily  . fluticasone (FLONASE) 50 MCG/ACT nasal spray Place 2 sprays into both nostrils daily.  . folic acid (FOLVITE) 1 MG tablet Take 1 mg by mouth 2 (two) times daily.   . InFLIXimab (REMICADE IV) Inject into the vein. Per Dr Gavin Potters  . losartan (COZAAR) 100 MG tablet Take 1 tablet (100 mg total) by mouth daily.  Marland Kitchen lovastatin (MEVACOR) 40 MG tablet Take 1 tablet (40 mg total) by mouth daily.  . methotrexate 25  MG/ML SOLN Administer .6 mls subcutaneously once a week  . omeprazole (PRILOSEC) 20 MG capsule Take 1 capsule (20 mg total) by mouth daily.  Marland Kitchen triamterene-hydrochlorothiazide (MAXZIDE-25) 37.5-25 MG per tablet Take 1 each (1 tablet total) by mouth daily.  . calcium carbonate (OS-CAL) 600 MG TABS Take 600 mg by mouth daily.  . [DISCONTINUED] aspirin 81 MG tablet Take 81 mg by mouth daily.  . [DISCONTINUED] fluticasone (FLONASE) 50 MCG/ACT nasal spray Place 2 sprays into the nose daily.    Review of Systems Patient denies any headache, lightheadedness or dizziness.  No significant sinus or allergy symptoms.  No chest pain, tightness or palpitations.  No increased shortness of breath, cough or congestion.  No nausea or vomiting.  No acid reflux.  On omeprazole and doing well.  No abdominal pain or cramping.  No bowel change, such as diarrhea, constipation, BRBPR or melana.  States her bowels are doing well now.  Desires no further intervenion.   No urine change.  Left hip pain as outlined.   Better s/p injection.      Objective:   Physical Exam  Filed Vitals:   04/14/13 0901  BP: 140/70  Pulse: 70  Temp: 97.8 F (36.6 C)   Blood pressure recheck:  34/74  79 year old female in no acute distress.  HEENT:  Nares- clear.  Oropharynx - without lesions. NECK:  Supple.  Nontender.  No audible bruit.  HEART:  Appears to be regular. LUNGS:  No crackles or wheezing audible.  Respirations even and unlabored.  RADIAL PULSE:  Equal bilaterally. ABDOMEN:  Soft, nontender.  Bowel sounds present and normal.  No audible abdominal bruit.    EXTREMITIES:  No increased edema present.  DP pulses palpable and equal bilaterally.        Assessment & Plan:  CARDIOVASCULAR.  Asymptomatic.  Continue risk factor modification.    PREVIOUS ABNORMAL MAMMOGRAM.  Saw Dr Katrinka Blazing and had biopsy 11/15/08.  Benign.  Had follow up mammogram 07/10/10.  Follow up views recommended.  These were done on 07/12/10.  Birads III.   Recommended follow up left breast mammo - 01/29/11 - Birads III.  Follow up bilateral mammogram 07/30/12 - BiRADS II.   PREVIOUS MILD RENAL INSUFFICIENCY.  Follow metabolic panel.    HEALTH MAINTENANCE.  Physical 07/12/12.  Mammogram as outlined.  Recently had a colonoscopy.  S/p hysterectomy.

## 2013-04-17 ENCOUNTER — Encounter: Payer: Self-pay | Admitting: Internal Medicine

## 2013-04-17 DIAGNOSIS — M25552 Pain in left hip: Secondary | ICD-10-CM | POA: Insufficient documentation

## 2013-04-17 NOTE — Assessment & Plan Note (Signed)
Continue vitamin D.  Follow.  Continue weight bearing exercise.   

## 2013-04-17 NOTE — Assessment & Plan Note (Signed)
Blood pressure under good control.  Same meds.  Follow metabolic panel.   

## 2013-04-17 NOTE — Assessment & Plan Note (Signed)
Saw dr Kernodle.  Is s/p injection.  Doing better.  Follow.    

## 2013-04-17 NOTE — Assessment & Plan Note (Signed)
Receiving Remicade.  Sees Dr Kernodle.  Follow.  On MTX.  Dr Kernodle is following MTX labs.    

## 2013-04-17 NOTE — Assessment & Plan Note (Signed)
Bowels doing well now.  Had recent colonoscopy.  Currently stable.

## 2013-04-17 NOTE — Assessment & Plan Note (Signed)
Symptoms controlled on Omeprazole.  Follow.   

## 2013-04-17 NOTE — Assessment & Plan Note (Signed)
On Lovastatin.  Follow lipid panel and liver function.  Last cholesterol check wnl.     

## 2013-05-09 NOTE — Assessment & Plan Note (Signed)
EGD (02/05/12) mild schatzki ring otherwise normal.

## 2013-05-19 ENCOUNTER — Ambulatory Visit: Payer: Self-pay

## 2013-05-19 DIAGNOSIS — K219 Gastro-esophageal reflux disease without esophagitis: Secondary | ICD-10-CM | POA: Diagnosis not present

## 2013-05-19 DIAGNOSIS — K5732 Diverticulitis of large intestine without perforation or abscess without bleeding: Secondary | ICD-10-CM | POA: Diagnosis not present

## 2013-05-19 DIAGNOSIS — I10 Essential (primary) hypertension: Secondary | ICD-10-CM | POA: Diagnosis not present

## 2013-05-19 DIAGNOSIS — M069 Rheumatoid arthritis, unspecified: Secondary | ICD-10-CM | POA: Diagnosis not present

## 2013-05-19 DIAGNOSIS — N390 Urinary tract infection, site not specified: Secondary | ICD-10-CM | POA: Diagnosis not present

## 2013-05-19 DIAGNOSIS — Z79899 Other long term (current) drug therapy: Secondary | ICD-10-CM | POA: Diagnosis not present

## 2013-06-03 ENCOUNTER — Other Ambulatory Visit: Payer: Self-pay | Admitting: Internal Medicine

## 2013-06-03 ENCOUNTER — Telehealth: Payer: Self-pay | Admitting: Internal Medicine

## 2013-06-03 DIAGNOSIS — K921 Melena: Secondary | ICD-10-CM

## 2013-06-03 NOTE — Telephone Encounter (Signed)
See note below

## 2013-06-03 NOTE — Telephone Encounter (Signed)
Patient called in states she would like to know if she can get her hemoglobin checked. She states last weekend her stool was dark and like pellets. She said that Dr. Nicki Reaper knows how her stomach acts up from time to time and she had taken medication Cipro for 5 days and stopped once her stomach felt better. She isn't having any problems at this time.

## 2013-06-03 NOTE — Progress Notes (Signed)
Order placed for cbc.  

## 2013-06-03 NOTE — Telephone Encounter (Signed)
I do not mind her getting a cbc checked, but if she is concerned about GI bleed - then I would recommend eval today at acute care and they can confirm hgb stable.  If she refuses eval today, I do not mind checking cbc - but sounds like needs eval.

## 2013-06-04 NOTE — Telephone Encounter (Signed)
Reviewed.  If dark stools or problems, needs evaluation.

## 2013-06-04 NOTE — Telephone Encounter (Signed)
Pt states that she will get a Remicade injection on Monday & she will see if they can check her labs while she is there

## 2013-06-06 DIAGNOSIS — M069 Rheumatoid arthritis, unspecified: Secondary | ICD-10-CM | POA: Diagnosis not present

## 2013-06-06 DIAGNOSIS — Z79899 Other long term (current) drug therapy: Secondary | ICD-10-CM | POA: Diagnosis not present

## 2013-06-06 NOTE — Telephone Encounter (Signed)
Pt aware.

## 2013-06-10 DIAGNOSIS — I280 Arteriovenous fistula of pulmonary vessels: Secondary | ICD-10-CM | POA: Diagnosis not present

## 2013-06-10 DIAGNOSIS — Z79899 Other long term (current) drug therapy: Secondary | ICD-10-CM | POA: Diagnosis not present

## 2013-08-01 DIAGNOSIS — M069 Rheumatoid arthritis, unspecified: Secondary | ICD-10-CM | POA: Diagnosis not present

## 2013-08-01 DIAGNOSIS — Z79899 Other long term (current) drug therapy: Secondary | ICD-10-CM | POA: Diagnosis not present

## 2013-08-01 DIAGNOSIS — M79609 Pain in unspecified limb: Secondary | ICD-10-CM | POA: Diagnosis not present

## 2013-08-17 ENCOUNTER — Encounter: Payer: Self-pay | Admitting: Internal Medicine

## 2013-08-17 ENCOUNTER — Encounter: Payer: Self-pay | Admitting: Emergency Medicine

## 2013-08-17 ENCOUNTER — Ambulatory Visit (INDEPENDENT_AMBULATORY_CARE_PROVIDER_SITE_OTHER): Payer: Medicare Other | Admitting: Internal Medicine

## 2013-08-17 VITALS — BP 130/70 | HR 57 | Temp 97.7°F | Ht 61.0 in | Wt 121.2 lb

## 2013-08-17 DIAGNOSIS — Z1239 Encounter for other screening for malignant neoplasm of breast: Secondary | ICD-10-CM | POA: Diagnosis not present

## 2013-08-17 DIAGNOSIS — M8580 Other specified disorders of bone density and structure, unspecified site: Secondary | ICD-10-CM

## 2013-08-17 DIAGNOSIS — M949 Disorder of cartilage, unspecified: Secondary | ICD-10-CM

## 2013-08-17 DIAGNOSIS — K5730 Diverticulosis of large intestine without perforation or abscess without bleeding: Secondary | ICD-10-CM | POA: Diagnosis not present

## 2013-08-17 DIAGNOSIS — I10 Essential (primary) hypertension: Secondary | ICD-10-CM

## 2013-08-17 DIAGNOSIS — M064 Inflammatory polyarthropathy: Secondary | ICD-10-CM

## 2013-08-17 DIAGNOSIS — M899 Disorder of bone, unspecified: Secondary | ICD-10-CM | POA: Diagnosis not present

## 2013-08-17 DIAGNOSIS — M1990 Unspecified osteoarthritis, unspecified site: Secondary | ICD-10-CM

## 2013-08-17 DIAGNOSIS — E7800 Pure hypercholesterolemia, unspecified: Secondary | ICD-10-CM

## 2013-08-17 DIAGNOSIS — M6530 Trigger finger, unspecified finger: Secondary | ICD-10-CM

## 2013-08-17 DIAGNOSIS — M25552 Pain in left hip: Secondary | ICD-10-CM

## 2013-08-17 DIAGNOSIS — K219 Gastro-esophageal reflux disease without esophagitis: Secondary | ICD-10-CM

## 2013-08-17 DIAGNOSIS — K5790 Diverticulosis of intestine, part unspecified, without perforation or abscess without bleeding: Secondary | ICD-10-CM

## 2013-08-17 DIAGNOSIS — M25559 Pain in unspecified hip: Secondary | ICD-10-CM

## 2013-08-17 NOTE — Progress Notes (Signed)
Pre-visit discussion using our clinic review tool. No additional management support is needed unless otherwise documented below in the visit note.  

## 2013-08-21 ENCOUNTER — Encounter: Payer: Self-pay | Admitting: Internal Medicine

## 2013-08-21 DIAGNOSIS — M6530 Trigger finger, unspecified finger: Secondary | ICD-10-CM | POA: Insufficient documentation

## 2013-08-21 NOTE — Assessment & Plan Note (Addendum)
Bowels doing well now.  Had recent colonoscopy.  Currently stable.  Intermittent flares.  Desires no further intervention.  Follow.

## 2013-08-21 NOTE — Assessment & Plan Note (Signed)
Receiving Remicade.  Sees Dr Kernodle.  Follow.  On MTX.  Dr Kernodle is following MTX labs.    

## 2013-08-21 NOTE — Assessment & Plan Note (Signed)
Blood pressure under good control.  Same meds.  Follow metabolic panel.

## 2013-08-21 NOTE — Assessment & Plan Note (Signed)
Continue vitamin D.  Follow.  Continue weight bearing exercise.   

## 2013-08-21 NOTE — Assessment & Plan Note (Signed)
Symptoms controlled on Omeprazole.  Follow.   

## 2013-08-21 NOTE — Assessment & Plan Note (Signed)
Saw dr Jefm Bryant.  Is s/p injection.  Doing better.  Follow.

## 2013-08-21 NOTE — Assessment & Plan Note (Signed)
On Lovastatin.  Follow lipid panel and liver function.  Last cholesterol check wnl.

## 2013-08-21 NOTE — Assessment & Plan Note (Signed)
Persistent problems with trigger finger.  Discussed treatment options.  Is s/p injection.  Will follow up with Dr Jefm Bryant.

## 2013-08-21 NOTE — Progress Notes (Signed)
Subjective:    Patient ID: Stacey Snyder, female    DOB: 1932/10/14, 80 y.o.   MRN: [id number]  HPI 80 year old female with past history of hypertension, hypercholesterolemia, inflammatory arthritis and previous hospitalization for pancreatitis s/p ERCP who comes in today to follow up on these issues as well as for a complete physical exam.   She states she is doing well.  Is receiving Remicade.  Sees Dr Jefm Bryant.  Back on MTX.   Bowels are doing well.  Colonoscopy/EGD recently performed by Dr Tiffany Kocher - "ok".  These were done for a work up for her anemia.  She is taking metamucil and miralax.  Doing well.  Taking tylenol.  Hands better.  She still has flares intermittently.  States occurs if she eats the wrong thing.  Desires no further f/u with GI.  She does report still having issues with a trigger finger.  Some pain.  Has had injection.  Overall she is doing relatively well.     Past Medical History  Diagnosis Date  . Hypertension   . Hypercholesterolemia   . Nephrolithiasis   . Fibrocystic breast disease   . Diverticulosis   . Pancreatitis 2007    s/p ERCP  . IBS (irritable bowel syndrome)   . Inflammatory arthritis     positive anti-CCP abs, s/p prednisone, MTX, Remicade  . Osteopenia     GI upset with Fosamax  . GERD (gastroesophageal reflux disease)   . Hypertension     Outpatient Encounter Prescriptions as of 08/17/2013  Medication Sig  . calcium carbonate (OS-CAL) 600 MG TABS Take 600 mg by mouth daily.  . Cholecalciferol (VITAMIN D) 2000 UNITS tablet Take 2,000 Units by mouth daily.  Marland Kitchen diltiazem (DILACOR XR) 240 MG 24 hr capsule Take 1 capsule (240 mg total) by mouth daily.  . fish oil-omega-3 fatty acids 1000 MG capsule One capsule daily  . fluticasone (FLONASE) 50 MCG/ACT nasal spray Place 2 sprays into both nostrils daily.  . folic acid (FOLVITE) 1 MG tablet Take 1 mg by mouth 2 (two) times daily.   . InFLIXimab (REMICADE IV) Inject into the vein. Per Dr Jefm Bryant  .  losartan (COZAAR) 100 MG tablet Take 1 tablet (100 mg total) by mouth daily.  Marland Kitchen lovastatin (MEVACOR) 40 MG tablet Take 1 tablet (40 mg total) by mouth daily.  . methotrexate 25 MG/ML SOLN Administer .6 mls subcutaneously once a week  . omeprazole (PRILOSEC) 20 MG capsule Take 1 capsule (20 mg total) by mouth daily.  Marland Kitchen triamterene-hydrochlorothiazide (MAXZIDE-25) 37.5-25 MG per tablet Take 1 tablet by mouth daily.    Review of Systems Patient denies any headache, lightheadedness or dizziness.  No significant sinus or allergy symptoms.  No chest pain, tightness or palpitations.  No increased shortness of breath, cough or congestion.  No nausea or vomiting.  No acid reflux. On omeprazole and doing well.  No abdominal pain or cramping.  No bowel change, such as diarrhea, constipation, BRBPR or melana.  States her bowels are doing well now.  Desires no further intervention.   No urine change.  Left hip pain better.       Objective:   Physical Exam  Filed Vitals:   08/17/13 0900  BP: 130/70  Pulse: 57  Temp: 97.7 F (36.5 C)   Blood pressure recheck:  64/65  80 year old female in no acute distress.   HEENT:  Nares- clear.  Oropharynx - without lesions. NECK:  Supple.  Nontender.  No audible  bruit.  HEART:  Appears to be regular. LUNGS:  No crackles or wheezing audible.  Respirations even and unlabored.  RADIAL PULSE:  Equal bilaterally.    BREASTS:  No nipple discharge or nipple retraction present.  Could not appreciate any distinct nodules or axillary adenopathy.  ABDOMEN:  Soft, nontender.  Bowel sounds present and normal.  No audible abdominal bruit.  GU:  Not performed.    EXTREMITIES:  No increased edema present.  DP pulses palpable and equal bilaterally.           Assessment & Plan:  CARDIOVASCULAR.  Asymptomatic.  Continue risk factor modification.    PREVIOUS ABNORMAL MAMMOGRAM.  Saw Dr Tamala Julian and had biopsy 11/15/08.  Benign.  Had follow up mammogram 07/10/10.  Follow up views  recommended.  These were done on 07/12/10.  Birads III.  Recommended follow up left breast mammo - 01/29/11 - Birads III.  Follow up bilateral mammogram 07/30/12 - BiRADS II.  Schedule a f/u mammogram.    PREVIOUS MILD RENAL INSUFFICIENCY.  Follow metabolic panel.    HEALTH MAINTENANCE.  Physical today.  Mammogram as outlined.  Due f/u mammogram.  Recently had a colonoscopy.  S/p hysterectomy.

## 2013-09-12 ENCOUNTER — Ambulatory Visit: Payer: Self-pay | Admitting: Internal Medicine

## 2013-09-12 DIAGNOSIS — Z1231 Encounter for screening mammogram for malignant neoplasm of breast: Secondary | ICD-10-CM | POA: Diagnosis not present

## 2013-09-12 LAB — HM MAMMOGRAPHY: HM MAMMO: NEGATIVE

## 2013-09-13 ENCOUNTER — Encounter: Payer: Self-pay | Admitting: Internal Medicine

## 2013-09-27 DIAGNOSIS — M069 Rheumatoid arthritis, unspecified: Secondary | ICD-10-CM | POA: Diagnosis not present

## 2013-09-27 DIAGNOSIS — Z79899 Other long term (current) drug therapy: Secondary | ICD-10-CM | POA: Diagnosis not present

## 2013-10-03 ENCOUNTER — Encounter: Payer: Self-pay | Admitting: Internal Medicine

## 2013-10-27 ENCOUNTER — Ambulatory Visit: Payer: Medicare Other | Admitting: Internal Medicine

## 2013-11-21 DIAGNOSIS — M069 Rheumatoid arthritis, unspecified: Secondary | ICD-10-CM | POA: Diagnosis not present

## 2013-11-21 DIAGNOSIS — M25519 Pain in unspecified shoulder: Secondary | ICD-10-CM | POA: Diagnosis not present

## 2013-11-21 DIAGNOSIS — M899 Disorder of bone, unspecified: Secondary | ICD-10-CM | POA: Diagnosis not present

## 2013-11-21 DIAGNOSIS — M949 Disorder of cartilage, unspecified: Secondary | ICD-10-CM | POA: Diagnosis not present

## 2013-11-21 DIAGNOSIS — Z79899 Other long term (current) drug therapy: Secondary | ICD-10-CM | POA: Diagnosis not present

## 2013-12-12 ENCOUNTER — Encounter: Payer: Self-pay | Admitting: Internal Medicine

## 2013-12-12 ENCOUNTER — Ambulatory Visit (INDEPENDENT_AMBULATORY_CARE_PROVIDER_SITE_OTHER): Payer: Medicare Other | Admitting: Internal Medicine

## 2013-12-12 VITALS — BP 110/40 | HR 58 | Temp 98.1°F | Ht 61.0 in | Wt 125.8 lb

## 2013-12-12 DIAGNOSIS — M25511 Pain in right shoulder: Secondary | ICD-10-CM

## 2013-12-12 DIAGNOSIS — M949 Disorder of cartilage, unspecified: Secondary | ICD-10-CM

## 2013-12-12 DIAGNOSIS — I10 Essential (primary) hypertension: Secondary | ICD-10-CM

## 2013-12-12 DIAGNOSIS — K219 Gastro-esophageal reflux disease without esophagitis: Secondary | ICD-10-CM

## 2013-12-12 DIAGNOSIS — M064 Inflammatory polyarthropathy: Secondary | ICD-10-CM

## 2013-12-12 DIAGNOSIS — E7800 Pure hypercholesterolemia, unspecified: Secondary | ICD-10-CM

## 2013-12-12 DIAGNOSIS — M25519 Pain in unspecified shoulder: Secondary | ICD-10-CM

## 2013-12-12 DIAGNOSIS — Z23 Encounter for immunization: Secondary | ICD-10-CM | POA: Diagnosis not present

## 2013-12-12 DIAGNOSIS — M1990 Unspecified osteoarthritis, unspecified site: Secondary | ICD-10-CM

## 2013-12-12 DIAGNOSIS — M8580 Other specified disorders of bone density and structure, unspecified site: Secondary | ICD-10-CM

## 2013-12-12 DIAGNOSIS — M899 Disorder of bone, unspecified: Secondary | ICD-10-CM

## 2013-12-12 NOTE — Assessment & Plan Note (Signed)
Symptoms controlled on Omeprazole.  Follow.

## 2013-12-12 NOTE — Assessment & Plan Note (Addendum)
On Lovastatin.  Follow lipid panel and liver function.  Cholesterol not checked with last labs at rheumatology.  Will see if can add to next labs.

## 2013-12-12 NOTE — Progress Notes (Signed)
Pre visit review using our clinic review tool, if applicable. No additional management support is needed unless otherwise documented below in the visit note. 

## 2013-12-17 ENCOUNTER — Encounter: Payer: Self-pay | Admitting: Internal Medicine

## 2013-12-17 DIAGNOSIS — M25511 Pain in right shoulder: Secondary | ICD-10-CM | POA: Insufficient documentation

## 2013-12-17 NOTE — Assessment & Plan Note (Signed)
Blood pressure under good control.  Same meds.  Follow metabolic panel.

## 2013-12-17 NOTE — Progress Notes (Signed)
Dk Subjective:    Patient ID: Stacey Snyder, female    DOB: 05-11-1932, 80 y.o.   MRN: [id number]  HPI 80 year old female with past history of hypertension, hypercholesterolemia, inflammatory arthritis and previous hospitalization for pancreatitis s/p ERCP who comes in today for a scheduled follow up.  She states she is doing well.  Is receiving Remicade.  Sees Dr Jefm Bryant.  Bowels are doing well.  Colonoscopy/EGD recently performed by Dr Tiffany Kocher. These were done for a work up for her anemia.  She is taking metamucil and miralax. She still has flares intermittently.  States occurs if she eats the wrong thing.  Desires no further f/u with GI.  She has not had a flare since her last visit here.  She recently had biceps tendon rupture.  Saw Dr  Jefm Bryant.  Still has pain  With lifting.  Overall otherwise doing relatively well.      Past Medical History  Diagnosis Date  . Hypertension   . Hypercholesterolemia   . Nephrolithiasis   . Fibrocystic breast disease   . Diverticulosis   . Pancreatitis 2007    s/p ERCP  . IBS (irritable bowel syndrome)   . Inflammatory arthritis     positive anti-CCP abs, s/p prednisone, MTX, Remicade  . Osteopenia     GI upset with Fosamax  . GERD (gastroesophageal reflux disease)   . Hypertension     Outpatient Encounter Prescriptions as of 12/12/2013  Medication Sig  . calcium carbonate (OS-CAL) 600 MG TABS Take 600 mg by mouth daily.  . Cholecalciferol (VITAMIN D) 2000 UNITS tablet Take 2,000 Units by mouth daily.  Marland Kitchen diltiazem (DILACOR XR) 240 MG 24 hr capsule Take 1 capsule (240 mg total) by mouth daily.  . fish oil-omega-3 fatty acids 1000 MG capsule One capsule daily  . fluticasone (FLONASE) 50 MCG/ACT nasal spray Place 2 sprays into both nostrils daily.  . folic acid (FOLVITE) 1 MG tablet Take 1 mg by mouth 2 (two) times daily.   . InFLIXimab (REMICADE IV) Inject into the vein. Per Dr Jefm Bryant  . losartan (COZAAR) 100 MG tablet Take 1 tablet (100 mg  total) by mouth daily.  Marland Kitchen lovastatin (MEVACOR) 40 MG tablet Take 1 tablet (40 mg total) by mouth daily.  . methotrexate 25 MG/ML SOLN Administer .6 mls subcutaneously once a week  . omeprazole (PRILOSEC) 20 MG capsule Take 1 capsule (20 mg total) by mouth daily.  Marland Kitchen triamterene-hydrochlorothiazide (MAXZIDE-25) 37.5-25 MG per tablet Take 1 tablet by mouth daily.    Review of Systems Patient denies any headache, lightheadedness or dizziness.  No significant sinus or allergy symptoms.  No chest pain, tightness or palpitations.  No increased shortness of breath, cough or congestion.  No nausea or vomiting.  No acid reflux. On omeprazole and doing well.  No abdominal pain or cramping.  No bowel change, such as diarrhea, constipation, BRBPR or melana.  States her bowels are stable.  Desires no further intervention.   No urine change.  Shoulder pain as outlined.  S/p tendon rupture.         Objective:   Physical Exam  Filed Vitals:   12/12/13 1441  BP: 110/40  Pulse: 58  Temp: 98.1 F (36.7 C)   Blood pressure recheck:  34/9-29  80 year old female in no acute distress.   HEENT:  Nares- clear.  Oropharynx - without lesions. NECK:  Supple.  Nontender.  No audible bruit.  HEART:  Appears to be regular. LUNGS:  No crackles or wheezing audible.  Respirations even and unlabored.  RADIAL PULSE:  Equal bilaterally.   ABDOMEN:  Soft, nontender.  Bowel sounds present and normal.  No audible abdominal bruit.    EXTREMITIES:  No increased edema present.  DP pulses palpable and equal bilaterally.    MSK:  Increased pain right shoulder - lifting/full extension.          Assessment & Plan:  CARDIOVASCULAR.  Asymptomatic.  Continue risk factor modification.    PREVIOUS MILD RENAL INSUFFICIENCY.  Follow metabolic panel.    HEALTH MAINTENANCE.  Physical 08/18/13.  Mammogram 09/12/13 - Birads I.   Colonoscopy 02/05/12 - diverticulosis and internal hemorrhoids.   S/p hysterectomy.

## 2013-12-17 NOTE — Assessment & Plan Note (Signed)
Continue vitamin D.  Follow.  Continue weight bearing exercise.

## 2013-12-17 NOTE — Assessment & Plan Note (Signed)
Receiving Remicade.  Sees Dr Jefm Bryant.  Follow.  On MTX.  Dr Jefm Bryant is following MTX labs.

## 2013-12-17 NOTE — Assessment & Plan Note (Signed)
S/p tendon rupture.  Followed by Dr Jefm Bryant.

## 2013-12-27 DIAGNOSIS — C44621 Squamous cell carcinoma of skin of unspecified upper limb, including shoulder: Secondary | ICD-10-CM | POA: Diagnosis not present

## 2013-12-27 DIAGNOSIS — L408 Other psoriasis: Secondary | ICD-10-CM | POA: Diagnosis not present

## 2014-01-16 DIAGNOSIS — Z79899 Other long term (current) drug therapy: Secondary | ICD-10-CM | POA: Diagnosis not present

## 2014-01-16 DIAGNOSIS — M069 Rheumatoid arthritis, unspecified: Secondary | ICD-10-CM | POA: Diagnosis not present

## 2014-01-19 ENCOUNTER — Ambulatory Visit: Payer: Self-pay

## 2014-01-19 ENCOUNTER — Emergency Department: Payer: Self-pay | Admitting: Emergency Medicine

## 2014-01-19 DIAGNOSIS — M069 Rheumatoid arthritis, unspecified: Secondary | ICD-10-CM | POA: Diagnosis not present

## 2014-01-19 DIAGNOSIS — M62838 Other muscle spasm: Secondary | ICD-10-CM | POA: Diagnosis not present

## 2014-01-19 DIAGNOSIS — M79609 Pain in unspecified limb: Secondary | ICD-10-CM | POA: Diagnosis not present

## 2014-01-19 DIAGNOSIS — S6990XA Unspecified injury of unspecified wrist, hand and finger(s), initial encounter: Secondary | ICD-10-CM | POA: Diagnosis not present

## 2014-01-19 DIAGNOSIS — K219 Gastro-esophageal reflux disease without esophagitis: Secondary | ICD-10-CM | POA: Diagnosis not present

## 2014-01-19 DIAGNOSIS — I10 Essential (primary) hypertension: Secondary | ICD-10-CM | POA: Diagnosis not present

## 2014-01-19 DIAGNOSIS — N390 Urinary tract infection, site not specified: Secondary | ICD-10-CM | POA: Diagnosis not present

## 2014-01-19 DIAGNOSIS — M6530 Trigger finger, unspecified finger: Secondary | ICD-10-CM | POA: Diagnosis not present

## 2014-01-19 DIAGNOSIS — Z79899 Other long term (current) drug therapy: Secondary | ICD-10-CM | POA: Diagnosis not present

## 2014-01-19 DIAGNOSIS — S6980XA Other specified injuries of unspecified wrist, hand and finger(s), initial encounter: Secondary | ICD-10-CM | POA: Diagnosis not present

## 2014-02-27 DIAGNOSIS — M0609 Rheumatoid arthritis without rheumatoid factor, multiple sites: Secondary | ICD-10-CM | POA: Diagnosis not present

## 2014-03-09 DIAGNOSIS — Z23 Encounter for immunization: Secondary | ICD-10-CM | POA: Diagnosis not present

## 2014-04-10 DIAGNOSIS — Z79899 Other long term (current) drug therapy: Secondary | ICD-10-CM | POA: Diagnosis not present

## 2014-04-10 DIAGNOSIS — M0609 Rheumatoid arthritis without rheumatoid factor, multiple sites: Secondary | ICD-10-CM | POA: Diagnosis not present

## 2014-04-10 DIAGNOSIS — M0589 Other rheumatoid arthritis with rheumatoid factor of multiple sites: Secondary | ICD-10-CM | POA: Diagnosis not present

## 2014-04-13 ENCOUNTER — Encounter (INDEPENDENT_AMBULATORY_CARE_PROVIDER_SITE_OTHER): Payer: Self-pay

## 2014-04-13 ENCOUNTER — Encounter: Payer: Self-pay | Admitting: Internal Medicine

## 2014-04-13 ENCOUNTER — Ambulatory Visit (INDEPENDENT_AMBULATORY_CARE_PROVIDER_SITE_OTHER): Payer: Medicare Other | Admitting: Internal Medicine

## 2014-04-13 VITALS — BP 134/70 | HR 69 | Temp 98.3°F | Ht 61.0 in | Wt 126.5 lb

## 2014-04-13 DIAGNOSIS — K219 Gastro-esophageal reflux disease without esophagitis: Secondary | ICD-10-CM | POA: Diagnosis not present

## 2014-04-13 DIAGNOSIS — M064 Inflammatory polyarthropathy: Secondary | ICD-10-CM

## 2014-04-13 DIAGNOSIS — R5383 Other fatigue: Secondary | ICD-10-CM

## 2014-04-13 DIAGNOSIS — K921 Melena: Secondary | ICD-10-CM

## 2014-04-13 DIAGNOSIS — M1990 Unspecified osteoarthritis, unspecified site: Secondary | ICD-10-CM

## 2014-04-13 DIAGNOSIS — E7800 Pure hypercholesterolemia, unspecified: Secondary | ICD-10-CM

## 2014-04-13 DIAGNOSIS — L989 Disorder of the skin and subcutaneous tissue, unspecified: Secondary | ICD-10-CM

## 2014-04-13 DIAGNOSIS — M6530 Trigger finger, unspecified finger: Secondary | ICD-10-CM

## 2014-04-13 DIAGNOSIS — I10 Essential (primary) hypertension: Secondary | ICD-10-CM

## 2014-04-13 LAB — CBC WITH DIFFERENTIAL/PLATELET
BASOS ABS: 0.1 10*3/uL (ref 0.0–0.1)
Basophils Relative: 0.8 % (ref 0.0–3.0)
EOS ABS: 0.1 10*3/uL (ref 0.0–0.7)
Eosinophils Relative: 1.8 % (ref 0.0–5.0)
HCT: 37.3 % (ref 36.0–46.0)
Hemoglobin: 12.3 g/dL (ref 12.0–15.0)
LYMPHS PCT: 43.3 % (ref 12.0–46.0)
Lymphs Abs: 2.9 10*3/uL (ref 0.7–4.0)
MCHC: 32.8 g/dL (ref 30.0–36.0)
MCV: 94.8 fl (ref 78.0–100.0)
Monocytes Absolute: 0.5 10*3/uL (ref 0.1–1.0)
Monocytes Relative: 7 % (ref 3.0–12.0)
NEUTROS PCT: 47.1 % (ref 43.0–77.0)
Neutro Abs: 3.1 10*3/uL (ref 1.4–7.7)
Platelets: 303 10*3/uL (ref 150.0–400.0)
RBC: 3.94 Mil/uL (ref 3.87–5.11)
RDW: 13.3 % (ref 11.5–15.5)
WBC: 6.6 10*3/uL (ref 4.0–10.5)

## 2014-04-13 LAB — LIPID PANEL
CHOL/HDL RATIO: 3
Cholesterol: 192 mg/dL (ref 0–200)
HDL: 62.5 mg/dL (ref >39.00)
LDL Cholesterol: 105 mg/dL — ABNORMAL HIGH (ref 0–99)
NONHDL: 129.5
Triglycerides: 121 mg/dL (ref 0.0–149.0)
VLDL: 24.2 mg/dL (ref 0.0–40.0)

## 2014-04-13 LAB — TSH: TSH: 1.56 u[IU]/mL (ref 0.35–4.50)

## 2014-04-13 MED ORDER — MUPIROCIN 2 % EX OINT: 1.0000 "application " | TOPICAL_OINTMENT | Freq: Two times a day (BID) | CUTANEOUS | Status: DC

## 2014-04-13 NOTE — Progress Notes (Signed)
Pre visit review using our clinic review tool, if applicable. No additional management support is needed unless otherwise documented below in the visit note. 

## 2014-04-13 NOTE — Progress Notes (Signed)
Dk Subjective:    Patient ID: Stacey Snyder, female    DOB: [birth year], 80 y.o.   MRN: [id number]  HPI 80 year old female with past history of hypertension, hypercholesterolemia, inflammatory arthritis and previous hospitalization for pancreatitis s/p ERCP who comes in today for a scheduled follow up.  She states she is doing well.  Is receiving Remicade.  Sees Dr Jefm Bryant.  Bowels are doing well. Colonoscopy/EGD performed by Dr Tiffany Kocher. These were done for a work up for her anemia.  She is taking metamucil and miralax. She still has flares intermittently.  States occurs if she eats the wrong thing.  Bowels doing ok.   Has trouble with a trigger finger. She also has persistent lesions on her right hand and left wrist.  Has been applying neosporin.  Overall she feels she is doing well.        Past Medical History  Diagnosis Date  . Hypertension   . Hypercholesterolemia   . Nephrolithiasis   . Fibrocystic breast disease   . Diverticulosis   . Pancreatitis 2007    s/p ERCP  . IBS (irritable bowel syndrome)   . Inflammatory arthritis     positive anti-CCP abs, s/p prednisone, MTX, Remicade  . Osteopenia     GI upset with Fosamax  . GERD (gastroesophageal reflux disease)   . Hypertension     Outpatient Encounter Prescriptions as of 04/13/2014  Medication Sig  . calcium carbonate (OS-CAL) 600 MG TABS Take 600 mg by mouth daily.  . Cholecalciferol (VITAMIN D) 2000 UNITS tablet Take 2,000 Units by mouth daily.  Marland Kitchen diltiazem (DILACOR XR) 240 MG 24 hr capsule Take 1 capsule (240 mg total) by mouth daily.  . fish oil-omega-3 fatty acids 1000 MG capsule One capsule daily  . fluticasone (FLONASE) 50 MCG/ACT nasal spray Place 2 sprays into both nostrils daily.  . folic acid (FOLVITE) 1 MG tablet Take 1 mg by mouth 2 (two) times daily.   . InFLIXimab (REMICADE IV) Inject into the vein. Per Dr Jefm Bryant  . losartan (COZAAR) 100 MG tablet Take 1 tablet (100 mg total) by mouth daily.  Marland Kitchen  lovastatin (MEVACOR) 40 MG tablet Take 1 tablet (40 mg total) by mouth daily.  . methotrexate 25 MG/ML SOLN Administer .6 mls subcutaneously once a week  . omeprazole (PRILOSEC) 20 MG capsule Take 1 capsule (20 mg total) by mouth daily.  Marland Kitchen triamterene-hydrochlorothiazide (MAXZIDE-25) 37.5-25 MG per tablet Take 1 tablet by mouth daily.    Review of Systems Patient denies any headache, lightheadedness or dizziness.  No significant sinus or allergy symptoms.  No chest pain, tightness or palpitations.  No increased shortness of breath, cough or congestion.  No nausea or vomiting.  No acid reflux.  On omeprazole and doing well.  No abdominal pain or cramping.  No bowel change, such as diarrhea, constipation, BRBPR or melana.  States her bowels are stable.  Desires no further intervention.   No urine change.  Problems with trigger finger as outlined.  Persistent lesions on her wrist and hand as outlined.         Objective:   Physical Exam  Filed Vitals:   04/13/14 0929  BP: 134/70  Pulse: 69  Temp: 98.3 F (36.8 C)   Blood pressure recheck:  49/41  80 year old female in no acute distress.   HEENT:  Nares- clear.  Oropharynx - without lesions. NECK:  Supple.  Nontender.  No audible bruit.  HEART:  Appears to be  regular. LUNGS:  No crackles or wheezing audible.  Respirations even and unlabored.  RADIAL PULSE:  Equal bilaterally.   ABDOMEN:  Soft, nontender.  Bowel sounds present and normal.  No audible abdominal bruit.    EXTREMITIES:  No increased edema present.  DP pulses palpable and equal bilaterally.    SKIN:  Persistent lesion right hand and left wrist.            Assessment & Plan:  1. Essential hypertension Blood pressure doing well.  Same medication regimen.  Follow metabolic panel.    2. Hypercholesterolemia Low cholesterol diet and exercise.  On lovastatin.  Follow lipid panel and liver function tests.  - Lipid panel  3. Other fatigue Does report some fatigue, but  overall feels she is doing relatively well.   - TSH  4. Gastroesophageal reflux disease, esophagitis presence not specified Symptoms controlled on omeprazole.    5. Inflammatory arthritis On remicade.  Sees Dr Carleene Cooper.  Also on MTX.   6. Trigger finger Desires no further intervention.    7. Skin lesion Persistent lesions as outlined.  bactroban as directed.  Notify me if does not completely resolve.   8. CARDIOVASCULAR.  Asymptomatic.  Continue risk factor modification.    9. PREVIOUS MILD RENAL INSUFFICIENCY.  Follow metabolic panel.    HEALTH MAINTENANCE.  Physical 08/18/13.  Mammogram 09/12/13 - Birads I.   Colonoscopy 02/05/12 - diverticulosis and internal hemorrhoids.   S/p hysterectomy.

## 2014-04-14 ENCOUNTER — Telehealth: Payer: Self-pay | Admitting: *Deleted

## 2014-04-14 ENCOUNTER — Encounter: Payer: Self-pay | Admitting: *Deleted

## 2014-04-14 NOTE — Telephone Encounter (Signed)
The standard directions say nose, but this is used on other areas of the skin.  She is to use on her hand as we discussed.

## 2014-04-14 NOTE — Telephone Encounter (Signed)
Pharmacy called stating that they received a Bactroban Rx  yesterday for pt to apply to nose, however patient thought she was getting something for her hands, not nose. Please advise

## 2014-04-14 NOTE — Telephone Encounter (Signed)
Pharmacy & patient notified to apply Bactroban to hand

## 2014-04-17 ENCOUNTER — Encounter: Payer: Self-pay | Admitting: Internal Medicine

## 2014-04-17 DIAGNOSIS — L989 Disorder of the skin and subcutaneous tissue, unspecified: Secondary | ICD-10-CM | POA: Insufficient documentation

## 2014-05-23 DIAGNOSIS — M0609 Rheumatoid arthritis without rheumatoid factor, multiple sites: Secondary | ICD-10-CM | POA: Diagnosis not present

## 2014-07-03 ENCOUNTER — Other Ambulatory Visit: Payer: Self-pay | Admitting: *Deleted

## 2014-07-03 MED ORDER — OMEPRAZOLE 20 MG PO CPDR: 20.0000 mg | DELAYED_RELEASE_CAPSULE | Freq: Every day | ORAL | Status: DC

## 2014-07-03 MED ORDER — DILTIAZEM HCL ER 240 MG PO CP24: 240.0000 mg | ORAL_CAPSULE | Freq: Every day | ORAL | Status: DC

## 2014-07-03 MED ORDER — LOSARTAN POTASSIUM 100 MG PO TABS: 100.0000 mg | ORAL_TABLET | Freq: Every day | ORAL | Status: DC

## 2014-07-03 MED ORDER — LOVASTATIN 40 MG PO TABS: 40.0000 mg | ORAL_TABLET | Freq: Every day | ORAL | Status: DC

## 2014-07-03 MED ORDER — TRIAMTERENE-HCTZ 37.5-25 MG PO TABS
1.0000 | ORAL_TABLET | Freq: Every day | ORAL | Status: DC
Start: 2014-07-03 — End: 2015-06-07

## 2014-07-04 DIAGNOSIS — M0579 Rheumatoid arthritis with rheumatoid factor of multiple sites without organ or systems involvement: Secondary | ICD-10-CM | POA: Diagnosis not present

## 2014-07-04 DIAGNOSIS — Z79899 Other long term (current) drug therapy: Secondary | ICD-10-CM | POA: Diagnosis not present

## 2014-07-04 DIAGNOSIS — M0609 Rheumatoid arthritis without rheumatoid factor, multiple sites: Secondary | ICD-10-CM | POA: Diagnosis not present

## 2014-07-12 DIAGNOSIS — M0579 Rheumatoid arthritis with rheumatoid factor of multiple sites without organ or systems involvement: Secondary | ICD-10-CM | POA: Diagnosis not present

## 2014-07-12 DIAGNOSIS — M5410 Radiculopathy, site unspecified: Secondary | ICD-10-CM | POA: Diagnosis not present

## 2014-08-06 ENCOUNTER — Other Ambulatory Visit: Payer: Self-pay | Admitting: Internal Medicine

## 2014-08-10 ENCOUNTER — Ambulatory Visit
Admit: 2014-08-10 | Disposition: A | Discharge status: Home / Self Care | Payer: Self-pay | Attending: Family Medicine | Admitting: Family Medicine

## 2014-08-10 DIAGNOSIS — M069 Rheumatoid arthritis, unspecified: Secondary | ICD-10-CM | POA: Diagnosis not present

## 2014-08-10 DIAGNOSIS — I10 Essential (primary) hypertension: Secondary | ICD-10-CM | POA: Diagnosis not present

## 2014-08-10 DIAGNOSIS — N390 Urinary tract infection, site not specified: Secondary | ICD-10-CM | POA: Diagnosis not present

## 2014-08-10 DIAGNOSIS — Z79899 Other long term (current) drug therapy: Secondary | ICD-10-CM | POA: Diagnosis not present

## 2014-08-10 LAB — URINALYSIS, COMPLETE: WBC UR: >30 /HPF (ref 0–5)

## 2014-08-12 LAB — URINE CULTURE

## 2014-08-16 ENCOUNTER — Ambulatory Visit (INDEPENDENT_AMBULATORY_CARE_PROVIDER_SITE_OTHER): Payer: Medicare Other | Admitting: Internal Medicine

## 2014-08-16 ENCOUNTER — Encounter: Payer: Self-pay | Admitting: Internal Medicine

## 2014-08-16 VITALS — BP 128/58 | HR 60 | Temp 98.1°F | Ht 61.0 in | Wt 123.0 lb

## 2014-08-16 DIAGNOSIS — M8580 Other specified disorders of bone density and structure, unspecified site: Secondary | ICD-10-CM | POA: Diagnosis not present

## 2014-08-16 DIAGNOSIS — I10 Essential (primary) hypertension: Secondary | ICD-10-CM

## 2014-08-16 DIAGNOSIS — N3000 Acute cystitis without hematuria: Secondary | ICD-10-CM | POA: Diagnosis not present

## 2014-08-16 DIAGNOSIS — M1990 Unspecified osteoarthritis, unspecified site: Secondary | ICD-10-CM

## 2014-08-16 DIAGNOSIS — M064 Inflammatory polyarthropathy: Secondary | ICD-10-CM

## 2014-08-16 DIAGNOSIS — E7800 Pure hypercholesterolemia, unspecified: Secondary | ICD-10-CM

## 2014-08-16 DIAGNOSIS — Z0000 Encounter for general adult medical examination without abnormal findings: Secondary | ICD-10-CM

## 2014-08-16 DIAGNOSIS — K219 Gastro-esophageal reflux disease without esophagitis: Secondary | ICD-10-CM

## 2014-08-16 LAB — URINALYSIS, ROUTINE W REFLEX MICROSCOPIC
Bilirubin Urine: NEGATIVE
HGB URINE DIPSTICK: NEGATIVE
Ketones, ur: NEGATIVE
Leukocytes, UA: NEGATIVE
Nitrite: NEGATIVE
Specific Gravity, Urine: 1.01 (ref 1.000–1.030)
TOTAL PROTEIN, URINE-UPE24: NEGATIVE
UROBILINOGEN UA: 0.2 (ref 0.0–1.0)
Urine Glucose: NEGATIVE
pH: 7.5 (ref 5.0–8.0)

## 2014-08-16 NOTE — Progress Notes (Signed)
Patient ID: Stacey Snyder, female   DOB: 03/25/1933, 81 y.o.   MRN: [id number]   Subjective:    Patient ID: Stacey Snyder, female    DOB: May 28, 1932, 81 y.o.   MRN: [id number]  HPI  Patient here for her physical exam.   States doing relatively well.  gI issues better.  No bad flares recently.  magoxide helps to regulate her bowels.  Went to urgent care last week.  UTI.  Treated with cipro.  Urine better.  More frequent urination.  Eating and drinking wel.  Not able to receive her remicade yesterday.  UTI.  Using a cane to walk.  Right leg pain.  S//p injections.     Past Medical History  Diagnosis Date  . Hypertension   . Hypercholesterolemia   . Nephrolithiasis   . Fibrocystic breast disease   . Diverticulosis   . Pancreatitis 2007    s/p ERCP  . IBS (irritable bowel syndrome)   . Inflammatory arthritis     positive anti-CCP abs, s/p prednisone, MTX, Remicade  . Osteopenia     GI upset with Fosamax  . GERD (gastroesophageal reflux disease)   . Hypertension     Current Outpatient Prescriptions on File Prior to Visit  Medication Sig Dispense Refill  . calcium carbonate (OS-CAL) 600 MG TABS Take 600 mg by mouth daily.    . Cholecalciferol (VITAMIN D) 2000 UNITS tablet Take 2,000 Units by mouth daily.    Marland Kitchen diltiazem (DILACOR XR) 240 MG 24 hr capsule Take 1 capsule (240 mg total) by mouth daily. 90 capsule 3  . fish oil-omega-3 fatty acids 1000 MG capsule One capsule daily    . fluticasone (FLONASE) 50 MCG/ACT nasal spray USE TWO SPRAY(S) IN EACH NOSTRIL ONCE DAILY 16 g 6  . folic acid (FOLVITE) 1 MG tablet Take 1 mg by mouth 2 (two) times daily.     . InFLIXimab (REMICADE IV) Inject into the vein. Per Dr Jefm Bryant    . losartan (COZAAR) 100 MG tablet Take 1 tablet (100 mg total) by mouth daily. 90 tablet 3  . lovastatin (MEVACOR) 40 MG tablet Take 1 tablet (40 mg total) by mouth daily. 90 tablet 3  . methotrexate 25 MG/ML SOLN Administer .6 mls subcutaneously once  a week    . mupirocin ointment (BACTROBAN) 2 % Place 1 application into the nose 2 (two) times daily. 22 g 0  . omeprazole (PRILOSEC) 20 MG capsule Take 1 capsule (20 mg total) by mouth daily. 90 capsule 3  . triamterene-hydrochlorothiazide (MAXZIDE-25) 37.5-25 MG per tablet Take 1 tablet by mouth daily. 90 tablet 3   No current facility-administered medications on file prior to visit.    Review of Systems  Constitutional: Negative for appetite change and unexpected weight change.  HENT: Negative for congestion and sinus pressure.   Respiratory: Negative for cough, chest tightness and shortness of breath.   Cardiovascular: Negative for chest pain, palpitations and leg swelling.  Gastrointestinal: Negative for nausea, vomiting, abdominal pain and diarrhea.  Genitourinary: Negative for dysuria and difficulty urinating.  Musculoskeletal: Positive for joint swelling (sees Dr Jefm Bryant.  ). Negative for neck pain.  Skin: Negative for color change and rash.  Neurological: Negative for dizziness, light-headedness and headaches.  Hematological: Negative for adenopathy. Does not bruise/bleed easily.  Psychiatric/Behavioral: Negative for dysphoric mood and agitation.       Objective:     Blood pressure recheck:  132/68  Physical Exam  Constitutional: She is oriented to person,  place, and time. She appears well-developed and well-nourished.  HENT:  Nose: Nose normal.  Mouth/Throat: Oropharynx is clear and moist.  Eyes: Right eye exhibits no discharge. Left eye exhibits no discharge. No scleral icterus.  Neck: Neck supple. No thyromegaly present.  Cardiovascular: Normal rate and regular rhythm.   Pulmonary/Chest: Breath sounds normal. No accessory muscle usage. No tachypnea. No respiratory distress. She has no decreased breath sounds. She has no wheezes. She has no rhonchi. Right breast exhibits no inverted nipple, no mass, no nipple discharge and no tenderness (no axillary adenopathy). Left  breast exhibits no inverted nipple, no mass, no nipple discharge and no tenderness (no axilarry adenopathy).  Abdominal: Soft. Bowel sounds are normal. There is no tenderness.  Musculoskeletal: She exhibits no edema or tenderness.  Lymphadenopathy:    She has no cervical adenopathy.  Neurological: She is alert and oriented to person, place, and time.  Skin: Skin is warm. No rash noted.  Psychiatric: She has a normal mood and affect. Her behavior is normal.    BP 128/58 mmHg  Pulse 60  Temp(Src) 98.1 F (36.7 C) (Oral)  Ht 5\' 1"  (1.549 m)  Wt 123 lb (55.792 kg)  BMI 23.25 kg/m2  SpO2 98% Wt Readings from Last 3 Encounters:  08/16/14 123 lb (55.792 kg)  04/13/14 126 lb 8 oz (57.38 kg)  12/12/13 125 lb 12 oz (57.04 kg)     Lab Results  Component Value Date   WBC 6.6 04/13/2014   HGB 12.3 04/13/2014   HCT 37.3 04/13/2014   PLT 303.0 04/13/2014   CHOL 192 04/13/2014   TRIG 121.0 04/13/2014   HDL 62.50 04/13/2014   LDLCALC 105* 04/13/2014   TSH 1.56 04/13/2014       Assessment & Plan:   Problem List Items Addressed This Visit    GERD (gastroesophageal reflux disease)    Controlled on omeprazole.        Health care maintenance    Physical 08/16/14.  Mammogram 09/12/13 - birads I.  Colonoscopy 2013 - diverticulosis and internal hemorrhoids.       Hypercholesterolemia    Low cholesterol diet and exercise.  On lovastatin.  Follow lipid panel and liver function tests.        Hypertension    Blood pressure doing well.  Same medication regimen.  Follow pressures.  Follow metabolic panel.        Inflammatory arthritis    Sees Dr Jefm Bryant.  On MTX.  Also receives remicade.  Follow.        Osteopenia    Continue vitamin d supplements.  Follow.        Relevant Orders   DG Bone Density   UTI (urinary tract infection) - Primary    Recently treated.  Would like to confirm cleared.  Still urinating frequently.  Follow.        Relevant Orders   Urinalysis, Routine w  reflex microscopic (Completed)   CULTURE, URINE COMPREHENSIVE (Completed)     I spent 25 minutes with the patient and more than 50% of the time was spent in consultation regarding the above.     Einar Pheasant, MD

## 2014-08-16 NOTE — Progress Notes (Signed)
Pre visit review using our clinic review tool, if applicable. No additional management support is needed unless otherwise documented below in the visit note. 

## 2014-08-17 LAB — CULTURE, URINE COMPREHENSIVE
Colony Count: NO GROWTH
Organism ID, Bacteria: NO GROWTH

## 2014-08-18 ENCOUNTER — Encounter: Payer: Self-pay | Admitting: Internal Medicine

## 2014-08-20 ENCOUNTER — Encounter: Payer: Self-pay | Admitting: Internal Medicine

## 2014-08-20 DIAGNOSIS — N390 Urinary tract infection, site not specified: Secondary | ICD-10-CM | POA: Insufficient documentation

## 2014-08-20 DIAGNOSIS — Z0000 Encounter for general adult medical examination without abnormal findings: Secondary | ICD-10-CM | POA: Insufficient documentation

## 2014-08-20 NOTE — Assessment & Plan Note (Signed)
Sees Dr Jefm Bryant.  On MTX.  Also receives remicade.  Follow.

## 2014-08-20 NOTE — Assessment & Plan Note (Signed)
Blood pressure doing well.  Same medication regimen.  Follow pressures.  Follow metabolic panel.   

## 2014-08-20 NOTE — Assessment & Plan Note (Signed)
Physical 08/16/14.  Mammogram 09/12/13 - birads I.  Colonoscopy 2013 - diverticulosis and internal hemorrhoids.

## 2014-08-20 NOTE — Assessment & Plan Note (Signed)
Low cholesterol diet and exercise.  On lovastatin.  Follow lipid panel and liver function tests.   

## 2014-08-20 NOTE — Assessment & Plan Note (Signed)
Continue vitamin d supplements.  Follow.  °

## 2014-08-20 NOTE — Assessment & Plan Note (Signed)
Recently treated.  Would like to confirm cleared.  Still urinating frequently.  Follow.

## 2014-08-20 NOTE — Assessment & Plan Note (Signed)
Controlled on omeprazole.   

## 2014-08-21 NOTE — Telephone Encounter (Signed)
Unread mychart message mailed to patient 

## 2014-08-28 DIAGNOSIS — M0579 Rheumatoid arthritis with rheumatoid factor of multiple sites without organ or systems involvement: Secondary | ICD-10-CM | POA: Diagnosis not present

## 2014-08-28 DIAGNOSIS — Z79899 Other long term (current) drug therapy: Secondary | ICD-10-CM | POA: Diagnosis not present

## 2014-08-28 DIAGNOSIS — M0609 Rheumatoid arthritis without rheumatoid factor, multiple sites: Secondary | ICD-10-CM | POA: Diagnosis not present

## 2014-09-14 ENCOUNTER — Other Ambulatory Visit: Payer: Self-pay | Admitting: Internal Medicine

## 2014-09-14 ENCOUNTER — Ambulatory Visit
Admission: RE | Admit: 2014-09-14 | Discharge: 2014-09-14 | Disposition: A | Discharge status: Home / Self Care | Payer: Medicare Other | Source: Ambulatory Visit | Attending: Internal Medicine | Admitting: Internal Medicine

## 2014-09-14 ENCOUNTER — Other Ambulatory Visit: Payer: Self-pay | Admitting: General Surgery

## 2014-09-14 DIAGNOSIS — Z1231 Encounter for screening mammogram for malignant neoplasm of breast: Secondary | ICD-10-CM

## 2014-10-17 DIAGNOSIS — M0609 Rheumatoid arthritis without rheumatoid factor, multiple sites: Secondary | ICD-10-CM | POA: Diagnosis not present

## 2014-10-25 DIAGNOSIS — M810 Age-related osteoporosis without current pathological fracture: Secondary | ICD-10-CM | POA: Diagnosis not present

## 2014-11-28 DIAGNOSIS — M0609 Rheumatoid arthritis without rheumatoid factor, multiple sites: Secondary | ICD-10-CM | POA: Diagnosis not present

## 2014-11-28 DIAGNOSIS — M0579 Rheumatoid arthritis with rheumatoid factor of multiple sites without organ or systems involvement: Secondary | ICD-10-CM | POA: Diagnosis not present

## 2014-11-28 DIAGNOSIS — Z79899 Other long term (current) drug therapy: Secondary | ICD-10-CM | POA: Diagnosis not present

## 2014-12-18 ENCOUNTER — Ambulatory Visit (INDEPENDENT_AMBULATORY_CARE_PROVIDER_SITE_OTHER): Payer: Medicare Other | Admitting: Internal Medicine

## 2014-12-18 VITALS — BP 112/64 | HR 57 | Temp 98.2°F | Ht 61.0 in | Wt 120.6 lb

## 2014-12-18 DIAGNOSIS — M8580 Other specified disorders of bone density and structure, unspecified site: Secondary | ICD-10-CM

## 2014-12-18 DIAGNOSIS — I10 Essential (primary) hypertension: Secondary | ICD-10-CM

## 2014-12-18 DIAGNOSIS — M1990 Unspecified osteoarthritis, unspecified site: Secondary | ICD-10-CM

## 2014-12-18 DIAGNOSIS — M064 Inflammatory polyarthropathy: Secondary | ICD-10-CM

## 2014-12-18 DIAGNOSIS — K219 Gastro-esophageal reflux disease without esophagitis: Secondary | ICD-10-CM

## 2014-12-18 DIAGNOSIS — E7800 Pure hypercholesterolemia, unspecified: Secondary | ICD-10-CM

## 2014-12-18 DIAGNOSIS — L989 Disorder of the skin and subcutaneous tissue, unspecified: Secondary | ICD-10-CM

## 2014-12-18 DIAGNOSIS — M1380 Other specified arthritis, unspecified site: Secondary | ICD-10-CM

## 2014-12-18 DIAGNOSIS — M25512 Pain in left shoulder: Secondary | ICD-10-CM

## 2014-12-18 NOTE — Progress Notes (Signed)
Patient ID: Kidada Ging, female   DOB: 16-Apr-1933, 81 y.o.   MRN: [id number]   Subjective:    Patient ID: [name], female    DOB: November 21, 1932, 81 y.o.   MRN: [id number]  HPI  Patient here for a scheduled follow up.  She has been cutting rose bushes - last week.  Left shoulder and arm are sore now.  Increased pain and some limited rom with reaching around or lifting up her arm.  Taking tylenol and this helps.  Desires no further evaluation or intervention.  Stays active.  No cardiac symptoms with increased activity or exertion.  No sob.  Eating and drinking well.  Bowels stable.  Persistent right neck lesion.  Discussed dermatology referral.     Past Medical History  Diagnosis Date  . Hypertension   . Hypercholesterolemia   . Nephrolithiasis   . Fibrocystic breast disease   . Diverticulosis   . Pancreatitis 2007    s/p ERCP  . IBS (irritable bowel syndrome)   . Inflammatory arthritis     positive anti-CCP abs, s/p prednisone, MTX, Remicade  . Osteopenia     GI upset with Fosamax  . GERD (gastroesophageal reflux disease)   . Hypertension     Family history and social history reviewed.     Outpatient Encounter Prescriptions as of 12/18/2014  Medication Sig  . calcium carbonate (OS-CAL) 600 MG TABS Take 600 mg by mouth daily.  . Cholecalciferol (VITAMIN D) 2000 UNITS tablet Take 2,000 Units by mouth daily.  Marland Kitchen diltiazem (DILACOR XR) 240 MG 24 hr capsule Take 1 capsule (240 mg total) by mouth daily.  . fish oil-omega-3 fatty acids 1000 MG capsule One capsule daily  . fluticasone (FLONASE) 50 MCG/ACT nasal spray USE TWO SPRAY(S) IN EACH NOSTRIL ONCE DAILY  . folic acid (FOLVITE) 1 MG tablet Take 1 mg by mouth 2 (two) times daily.   . InFLIXimab (REMICADE IV) Inject into the vein. Per Dr Jefm Bryant  . losartan (COZAAR) 100 MG tablet Take 1 tablet (100 mg total) by mouth daily.  Marland Kitchen lovastatin (MEVACOR) 40 MG tablet Take 1 tablet (40 mg total) by mouth daily.  .  magnesium oxide (MAG-OX) 400 (241.3 MG) MG tablet Take 400 mg by mouth daily.  . methotrexate 25 MG/ML SOLN Administer .6 mls subcutaneously once a week  . mupirocin ointment (BACTROBAN) 2 % Place 1 application into the nose 2 (two) times daily.  Marland Kitchen omeprazole (PRILOSEC) 20 MG capsule Take 1 capsule (20 mg total) by mouth daily.  Marland Kitchen triamterene-hydrochlorothiazide (MAXZIDE-25) 37.5-25 MG per tablet Take 1 tablet by mouth daily.   No facility-administered encounter medications on file as of 12/18/2014.    Review of Systems  Constitutional: Negative for appetite change and unexpected weight change.  HENT: Negative for congestion and sinus pressure.   Eyes: Negative for discharge and visual disturbance.  Respiratory: Negative for cough, chest tightness and shortness of breath.   Cardiovascular: Negative for chest pain, palpitations and leg swelling.  Gastrointestinal: Negative for nausea, vomiting, abdominal pain and diarrhea.  Genitourinary: Negative for dysuria and difficulty urinating.  Musculoskeletal:       Left shoulder and arm pain as outlined.  jts stable.    Skin: Negative for color change and rash.  Neurological: Negative for dizziness, light-headedness and headaches.  Hematological: Negative for adenopathy. Does not bruise/bleed easily.  Psychiatric/Behavioral: Negative for dysphoric mood and agitation.       Objective:     Blood pressure rechecked by  me:  132/60  Physical Exam  Constitutional: She appears well-developed and well-nourished. No distress.  HENT:  Nose: Nose normal.  Mouth/Throat: Oropharynx is clear and moist.  Neck: Neck supple. No thyromegaly present.  Cardiovascular: Normal rate and regular rhythm.   1/6 systolic murmur.   Pulmonary/Chest: Breath sounds normal. No respiratory distress. She has no wheezes.  Abdominal: Soft. Bowel sounds are normal. There is no tenderness.  Musculoskeletal: She exhibits no edema or tenderness.  Increased pain with  abduction left arm.    Lymphadenopathy:    She has no cervical adenopathy.  Skin: No rash noted. No erythema.  Psychiatric: She has a normal mood and affect. Her behavior is normal.    BP 112/64 mmHg  Pulse 57  Temp(Src) 98.2 F (36.8 C) (Oral)  Ht 5\' 1"  (1.549 m)  Wt 120 lb 9.6 oz (54.704 kg)  BMI 22.80 kg/m2  SpO2 98% Wt Readings from Last 3 Encounters:  12/18/14 120 lb 9.6 oz (54.704 kg)  08/16/14 123 lb (55.792 kg)  04/13/14 126 lb 8 oz (57.38 kg)     Lab Results  Component Value Date   WBC 6.6 04/13/2014   HGB 12.3 04/13/2014   HCT 37.3 04/13/2014   PLT 303.0 04/13/2014   CHOL 192 04/13/2014   TRIG 121.0 04/13/2014   HDL 62.50 04/13/2014   LDLCALC 105* 04/13/2014   TSH 1.56 04/13/2014    Mm Screening Breast Tomo Bilateral  09/14/2014   CLINICAL DATA:  Screening.  EXAM: DIGITAL SCREENING BILATERAL MAMMOGRAM WITH 3D TOMO WITH CAD  COMPARISON:  Previous exam(s).  ACR Breast Density Category b: There are scattered areas of fibroglandular density.  FINDINGS: There are no findings suspicious for malignancy. Images were processed with CAD.  IMPRESSION: No mammographic evidence of malignancy. A result letter of this screening mammogram will be mailed directly to the patient.  RECOMMENDATION: Screening mammogram in one year. (Code:SM-B-01Y)  BI-RADS CATEGORY  1: Negative.   Electronically Signed   By: Lajean Manes M.D.   On: 09/14/2014 10:58       Assessment & Plan:   Problem List Items Addressed This Visit    GERD (gastroesophageal reflux disease)    Controlled on omeprazole.  Follow.       Hypercholesterolemia    Low cholesterol diet and exercise.  On lovastatin.  Check lipid panel and liver function tests.       Hypertension    Blood pressure under good control.  Continue same medication regimen.  Follow pressures.  Follow metabolic panel.        Inflammatory arthritis    Sees Dr Jefm Bryant.  Receiving remicade.  He is following labs.        Left shoulder pain  - Primary    Left shoulder and arm pain after cutting rose bushes.  Tylenol helps.  Discussed further treatment and evaluation.  She declines.  Follow.        Osteopenia    Had bone density over at Dr Scharlene Gloss.  Obtain report.        Skin lesion    Persistent skin lesion of neck.  Refer to dermatology.        Relevant Orders   Ambulatory referral to Dermatology       Einar Pheasant, MD

## 2014-12-18 NOTE — Progress Notes (Signed)
Pre visit review using our clinic review tool, if applicable. No additional management support is needed unless otherwise documented below in the visit note. 

## 2014-12-20 ENCOUNTER — Encounter: Payer: Self-pay | Admitting: Internal Medicine

## 2014-12-20 DIAGNOSIS — M25512 Pain in left shoulder: Secondary | ICD-10-CM | POA: Insufficient documentation

## 2014-12-20 NOTE — Assessment & Plan Note (Signed)
Left shoulder and arm pain after cutting rose bushes.  Tylenol helps.  Discussed further treatment and evaluation.  She declines.  Follow.

## 2014-12-20 NOTE — Assessment & Plan Note (Signed)
Sees Dr Jefm Bryant.  Receiving remicade.  He is following labs.

## 2014-12-20 NOTE — Assessment & Plan Note (Signed)
Low cholesterol diet and exercise.  On lovastatin.  Check lipid panel and liver function tests.

## 2014-12-20 NOTE — Assessment & Plan Note (Signed)
Blood pressure under good control.  Continue same medication regimen.  Follow pressures.  Follow metabolic panel.   

## 2014-12-20 NOTE — Assessment & Plan Note (Signed)
Persistent skin lesion of neck.  Refer to dermatology.

## 2014-12-20 NOTE — Assessment & Plan Note (Signed)
Had bone density over at Dr Scharlene Gloss.  Obtain report.

## 2014-12-20 NOTE — Assessment & Plan Note (Signed)
Controlled on omeprazole.  Follow.  

## 2015-01-01 DIAGNOSIS — L570 Actinic keratosis: Secondary | ICD-10-CM | POA: Diagnosis not present

## 2015-01-01 DIAGNOSIS — L578 Other skin changes due to chronic exposure to nonionizing radiation: Secondary | ICD-10-CM | POA: Diagnosis not present

## 2015-01-01 DIAGNOSIS — L820 Inflamed seborrheic keratosis: Secondary | ICD-10-CM | POA: Diagnosis not present

## 2015-01-23 DIAGNOSIS — M0609 Rheumatoid arthritis without rheumatoid factor, multiple sites: Secondary | ICD-10-CM | POA: Diagnosis not present

## 2015-03-08 DIAGNOSIS — Z23 Encounter for immunization: Secondary | ICD-10-CM | POA: Diagnosis not present

## 2015-03-26 DIAGNOSIS — M0579 Rheumatoid arthritis with rheumatoid factor of multiple sites without organ or systems involvement: Secondary | ICD-10-CM | POA: Diagnosis not present

## 2015-03-26 DIAGNOSIS — Z79899 Other long term (current) drug therapy: Secondary | ICD-10-CM | POA: Diagnosis not present

## 2015-03-26 DIAGNOSIS — M542 Cervicalgia: Secondary | ICD-10-CM | POA: Diagnosis not present

## 2015-03-26 DIAGNOSIS — M0609 Rheumatoid arthritis without rheumatoid factor, multiple sites: Secondary | ICD-10-CM | POA: Diagnosis not present

## 2015-04-09 ENCOUNTER — Other Ambulatory Visit: Payer: Self-pay

## 2015-04-09 ENCOUNTER — Telehealth: Payer: Self-pay | Admitting: *Deleted

## 2015-04-09 MED ORDER — PENCICLOVIR 1 % EX CREA: 1.0000 "application " | TOPICAL_CREAM | CUTANEOUS | Status: DC

## 2015-04-09 NOTE — Telephone Encounter (Signed)
Please advise 

## 2015-04-09 NOTE — Telephone Encounter (Signed)
Patient requested to have a medication for a fever blister. Patient stated that she could not be seen in the office, for this issue due to her being at [facility] with her husband, he's currently in ICU.

## 2015-04-09 NOTE — Telephone Encounter (Signed)
Patient called back and was informed that medication was sent to pharmacy.

## 2015-04-09 NOTE — Telephone Encounter (Signed)
Attempted to call patient, no answer.  Sent in prescription per Dr. Bary Leriche note.  Will attempt to call patient again.  Prescription sent to [facility] in [place].

## 2015-04-09 NOTE — Telephone Encounter (Signed)
I assume the fever blister in on her lip.  If so, then can call in denavir apply to affected area qid.  One tube with no refills.  If persistent problems, will nee to be seen.

## 2015-04-10 ENCOUNTER — Other Ambulatory Visit: Payer: Self-pay | Admitting: *Deleted

## 2015-04-10 ENCOUNTER — Telehealth: Payer: Self-pay | Admitting: Internal Medicine

## 2015-04-10 MED ORDER — ACYCLOVIR 5 % EX OINT: 1.0000 "application " | TOPICAL_OINTMENT | Freq: Four times a day (QID) | CUTANEOUS | Status: DC

## 2015-04-10 NOTE — Telephone Encounter (Signed)
Notify pt that she can try zovirax ointment - apply to affected area on lip qid - one tube with no refills.

## 2015-04-10 NOTE — Telephone Encounter (Signed)
Please advise 

## 2015-04-10 NOTE — Telephone Encounter (Signed)
Pt went pick prescription for cold sore. The cost was over $200. Pt is requesting a cheaper medicine for the cold sore.  Thank you, kp

## 2015-04-10 NOTE — Telephone Encounter (Signed)
Sent in new Rx & notified pt via mychart

## 2015-04-16 ENCOUNTER — Telehealth: Payer: Self-pay | Admitting: Internal Medicine

## 2015-04-16 NOTE — Telephone Encounter (Signed)
Called pt to resched appt on 12/19.Stacey Snyder Pt stated that she didn't want to wait to be seen by Dr. Nicki Reaper.  Pt states that her cold sore is not getting any better.. Please advise where to schedule..Thanks

## 2015-04-16 NOTE — Telephone Encounter (Signed)
See note I left for you to schedule her for Wednesday.  Thanks

## 2015-04-18 ENCOUNTER — Ambulatory Visit (INDEPENDENT_AMBULATORY_CARE_PROVIDER_SITE_OTHER): Payer: Medicare Other | Admitting: Internal Medicine

## 2015-04-18 ENCOUNTER — Encounter: Payer: Self-pay | Admitting: Internal Medicine

## 2015-04-18 VITALS — BP 90/50 | HR 77 | Temp 98.0°F | Resp 18 | Ht 61.0 in | Wt 118.5 lb

## 2015-04-18 DIAGNOSIS — K130 Diseases of lips: Secondary | ICD-10-CM

## 2015-04-18 DIAGNOSIS — F439 Reaction to severe stress, unspecified: Secondary | ICD-10-CM

## 2015-04-18 DIAGNOSIS — E7800 Pure hypercholesterolemia, unspecified: Secondary | ICD-10-CM | POA: Diagnosis not present

## 2015-04-18 DIAGNOSIS — L989 Disorder of the skin and subcutaneous tissue, unspecified: Secondary | ICD-10-CM

## 2015-04-18 DIAGNOSIS — M1990 Unspecified osteoarthritis, unspecified site: Secondary | ICD-10-CM

## 2015-04-18 DIAGNOSIS — R634 Abnormal weight loss: Secondary | ICD-10-CM | POA: Diagnosis not present

## 2015-04-18 DIAGNOSIS — Z658 Other specified problems related to psychosocial circumstances: Secondary | ICD-10-CM

## 2015-04-18 LAB — LIPID PANEL
Cholesterol: 163 mg/dL (ref 0–200)
HDL: 69.7 mg/dL (ref >39.00)
LDL Cholesterol: 60 mg/dL (ref 0–99)
NONHDL: 93.36
Total CHOL/HDL Ratio: 2
Triglycerides: 166 mg/dL — ABNORMAL HIGH (ref 0.0–149.0)
VLDL: 33.2 mg/dL (ref 0.0–40.0)

## 2015-04-18 LAB — CBC WITH DIFFERENTIAL/PLATELET
Basophils Absolute: 0 10*3/uL (ref 0.0–0.1)
Basophils Relative: 0.6 % (ref 0.0–3.0)
EOS ABS: 0.1 10*3/uL (ref 0.0–0.7)
EOS PCT: 0.8 % (ref 0.0–5.0)
HCT: 36.6 % (ref 36.0–46.0)
Hemoglobin: 12.2 g/dL (ref 12.0–15.0)
LYMPHS PCT: 26.9 % (ref 12.0–46.0)
Lymphs Abs: 2.2 10*3/uL (ref 0.7–4.0)
MCHC: 33.4 g/dL (ref 30.0–36.0)
MCV: 95.1 fl (ref 78.0–100.0)
Monocytes Absolute: 0.9 10*3/uL (ref 0.1–1.0)
Monocytes Relative: 11.1 % (ref 3.0–12.0)
NEUTROS ABS: 4.9 10*3/uL (ref 1.4–7.7)
NEUTROS PCT: 60.6 % (ref 43.0–77.0)
Platelets: 356 10*3/uL (ref 150.0–400.0)
RBC: 3.84 Mil/uL — ABNORMAL LOW (ref 3.87–5.11)
RDW: 13.2 % (ref 11.5–15.5)
WBC: 8 10*3/uL (ref 4.0–10.5)

## 2015-04-18 LAB — TSH: TSH: 1.05 u[IU]/mL (ref 0.35–4.50)

## 2015-04-18 MED ORDER — MUPIROCIN 2 % EX OINT: TOPICAL_OINTMENT | CUTANEOUS | Status: DC

## 2015-04-18 MED ORDER — ACYCLOVIR 400 MG PO TABS: ORAL_TABLET | ORAL | Status: DC

## 2015-04-18 NOTE — Progress Notes (Signed)
Pre-visit discussion using our clinic review tool. No additional management support is needed unless otherwise documented below in the visit note.  

## 2015-04-18 NOTE — Progress Notes (Signed)
Patient ID: Stacey Snyder, female   DOB: 09-Aug-1932, 81 y.o.   MRN: [id number]   Subjective:    Patient ID: Stacey Snyder, female    DOB: 10/03/1932, 81 y.o.   MRN: [id number]  HPI  Patient with past history of hypercholesterolemia, inflammatory arthritis, hypertension and GERD.  She comes in today as a work in with concerns regarding a persistent lip lesion.  States she has a cold sore on her lip.  Has been present now for the last couple of weeks.  She has tried otc medication and has tried zovirax topical ointment.  Still persistent.  Also has a lesion in her scalp.  No fever.  Has been under increased stress recently.  Feels she is handling things relatively well.  Eating and drinking.    Past Medical History  Diagnosis Date  . Hypertension   . Hypercholesterolemia   . Nephrolithiasis   . Fibrocystic breast disease   . Diverticulosis   . Pancreatitis 2007    s/p ERCP  . IBS (irritable bowel syndrome)   . Inflammatory arthritis ([facility])     positive anti-CCP abs, s/p prednisone, MTX, Remicade  . Osteopenia     GI upset with Fosamax  . GERD (gastroesophageal reflux disease)   . Hypertension    Past Surgical History  Procedure Laterality Date  . Lumbar laminectomy  1983  . Abdominal hysterectomy  1993    ovaries not removed  . Lithotripsy    . Trigger finger release      right ring finger  . Breast biopsy Left 1984    negative  . Breast cyst excision Left 2010    negative   Family History  Problem Relation Age of Onset  . Ovarian cancer Mother   . Renal Disease Mother   . Heart disease Mother   . Diabetes Mother   . Hypertension Mother    Social History   Social History  . Marital Status: Married    Spouse Name: N/A  . Number of Children: 4  . Years of Education: N/A   Social History Main Topics  . Smoking status: Never Smoker   . Smokeless tobacco: Never Used  . Alcohol Use: No  . Drug Use: No  . Sexual Activity: Not Asked   Other Topics  Concern  . None   Social History Narrative    Outpatient Encounter Prescriptions as of 04/18/2015  Medication Sig  . acyclovir ointment (ZOVIRAX) 5 % Apply 1 application topically 4 (four) times daily. Apply to lip  . calcium carbonate (OS-CAL) 600 MG TABS Take 600 mg by mouth daily.  . Cholecalciferol (VITAMIN D) 2000 UNITS tablet Take 2,000 Units by mouth daily.  Marland Kitchen diltiazem (DILACOR XR) 240 MG 24 hr capsule Take 1 capsule (240 mg total) by mouth daily.  . fish oil-omega-3 fatty acids 1000 MG capsule One capsule daily  . fluticasone (FLONASE) 50 MCG/ACT nasal spray USE TWO SPRAY(S) IN EACH NOSTRIL ONCE DAILY  . folic acid (FOLVITE) 1 MG tablet Take 1 mg by mouth 2 (two) times daily.   . InFLIXimab (REMICADE IV) Inject into the vein. Per Dr Jefm Bryant  . losartan (COZAAR) 100 MG tablet Take 1 tablet (100 mg total) by mouth daily.  Marland Kitchen lovastatin (MEVACOR) 40 MG tablet Take 1 tablet (40 mg total) by mouth daily.  . magnesium oxide (MAG-OX) 400 (241.3 MG) MG tablet Take 400 mg by mouth daily.  . methotrexate 25 MG/ML SOLN Administer .6 mls subcutaneously once a week  .  mupirocin ointment (BACTROBAN) 2 % Apply topically to scalp twice a day  . penciclovir (DENAVIR) 1 % cream Apply 1 application topically every 2 (two) hours. For Fever Blister  . ranitidine (ZANTAC) 150 MG tablet Take 150 mg by mouth daily.  Marland Kitchen triamterene-hydrochlorothiazide (MAXZIDE-25) 37.5-25 MG per tablet Take 1 tablet by mouth daily.  . [DISCONTINUED] mupirocin ointment (BACTROBAN) 2 % Place 1 application into the nose 2 (two) times daily.  . [DISCONTINUED] omeprazole (PRILOSEC) 20 MG capsule Take 1 capsule (20 mg total) by mouth daily.  Marland Kitchen acyclovir (ZOVIRAX) 400 MG tablet Take one tablet 5x/day   No facility-administered encounter medications on file as of 04/18/2015.    Review of Systems  Constitutional: Negative for fever and chills.  HENT: Negative for congestion and sinus pressure.        Persistent lip lesion as  outlined.    Eyes: Negative for discharge and redness.  Respiratory: Negative for cough, chest tightness and shortness of breath.   Gastrointestinal: Negative for nausea and vomiting.  Skin:       Persistent lesion - scalp.  Persistent lip lesion.    Neurological: Negative for headaches.  Psychiatric/Behavioral:       Increased stress as outlined.         Objective:    Physical Exam  Constitutional: No distress.  HENT:  Nose: Nose normal.  Mouth/Throat: Oropharynx is clear and moist.  Persistent lower lip lesion.  Appears to be c/w viral herpetic lesion.  No lesion inside the mouth.   Eyes: Conjunctivae are normal. Right eye exhibits no discharge. Left eye exhibits no discharge.  Neck: Neck supple.  Cardiovascular: Normal rate and regular rhythm.   Pulmonary/Chest: Breath sounds normal. No respiratory distress. She has no wheezes.  Abdominal: Soft. Bowel sounds are normal. There is no tenderness.  Musculoskeletal: She exhibits no edema or tenderness.  Lymphadenopathy:    She has no cervical adenopathy.  Skin:  Lesion - scalp - small raised lesion.     BP 90/50 mmHg  Pulse 77  Temp(Src) 98 F (36.7 C) (Oral)  Resp 18  Ht 5\' 1"  (1.549 m)  Wt 118 lb 8 oz (53.751 kg)  BMI 22.40 kg/m2  SpO2 98% Wt Readings from Last 3 Encounters:  04/18/15 118 lb 8 oz (53.751 kg)  12/18/14 120 lb 9.6 oz (54.704 kg)  08/16/14 123 lb (55.792 kg)     Lab Results  Component Value Date   WBC 8.0 04/18/2015   HGB 12.2 04/18/2015   HCT 36.6 04/18/2015   PLT 356.0 04/18/2015   CHOL 163 04/18/2015   TRIG 166.0* 04/18/2015   HDL 69.70 04/18/2015   LDLCALC 60 04/18/2015   TSH 1.05 04/18/2015        Assessment & Plan:   Problem List Items Addressed This Visit    Hypercholesterolemia - Primary   Relevant Orders   Lipid panel (Completed)   Inflammatory arthritis ([place])    Followed by Dr Jefm Bryant.  Needs her current infection cleared prior to next infusion.       Lip lesion     Persistent.  Appears to be c/w herpetic lesion.  Treat with acyclovir.  Notify me if persistent.        Loss of weight    Discussed with her today.  She has been under increased stress.  Things have leveled off some.  Encourage increase po intake.   Follow weight.        Relevant Orders   CBC with  Differential/Platelet (Completed)   TSH (Completed)   Scalp lesion    Persistent lesion of scalp.  Treat with bactroban as directed.  Follow.        Stress    Discussed with her today.  Desires no further intervention.  Follow.           Einar Pheasant, MD

## 2015-04-19 ENCOUNTER — Encounter: Payer: Self-pay | Admitting: Internal Medicine

## 2015-04-20 NOTE — Telephone Encounter (Signed)
Unread mychart message mailed to patient 

## 2015-04-23 ENCOUNTER — Encounter: Payer: Self-pay | Admitting: Internal Medicine

## 2015-04-23 ENCOUNTER — Ambulatory Visit: Payer: Medicare Other | Admitting: Internal Medicine

## 2015-04-23 DIAGNOSIS — R634 Abnormal weight loss: Secondary | ICD-10-CM | POA: Insufficient documentation

## 2015-04-23 DIAGNOSIS — F439 Reaction to severe stress, unspecified: Secondary | ICD-10-CM | POA: Insufficient documentation

## 2015-04-23 DIAGNOSIS — L989 Disorder of the skin and subcutaneous tissue, unspecified: Secondary | ICD-10-CM | POA: Insufficient documentation

## 2015-04-23 DIAGNOSIS — K130 Diseases of lips: Secondary | ICD-10-CM | POA: Insufficient documentation

## 2015-04-23 NOTE — Assessment & Plan Note (Signed)
Persistent.  Appears to be c/w herpetic lesion.  Treat with acyclovir.  Notify me if persistent.

## 2015-04-23 NOTE — Assessment & Plan Note (Signed)
Discussed with her today. Desires no further intervention.  Follow.   

## 2015-04-23 NOTE — Assessment & Plan Note (Signed)
Followed by Dr Jefm Bryant.  Needs her current infection cleared prior to next infusion.

## 2015-04-23 NOTE — Assessment & Plan Note (Signed)
Discussed with her today.  She has been under increased stress.  Things have leveled off some.  Encourage increase po intake.   Follow weight.

## 2015-04-23 NOTE — Assessment & Plan Note (Signed)
Persistent lesion of scalp.  Treat with bactroban as directed.  Follow.

## 2015-05-21 DIAGNOSIS — M0609 Rheumatoid arthritis without rheumatoid factor, multiple sites: Secondary | ICD-10-CM | POA: Diagnosis not present

## 2015-05-21 DIAGNOSIS — M0579 Rheumatoid arthritis with rheumatoid factor of multiple sites without organ or systems involvement: Secondary | ICD-10-CM | POA: Diagnosis not present

## 2015-05-21 DIAGNOSIS — Z79899 Other long term (current) drug therapy: Secondary | ICD-10-CM | POA: Diagnosis not present

## 2015-06-07 ENCOUNTER — Other Ambulatory Visit: Payer: Self-pay

## 2015-06-07 MED ORDER — LOSARTAN POTASSIUM 100 MG PO TABS: 100.0000 mg | ORAL_TABLET | Freq: Every day | ORAL | Status: DC

## 2015-06-07 MED ORDER — TRIAMTERENE-HCTZ 37.5-25 MG PO TABS: 1.0000 | ORAL_TABLET | Freq: Every day | ORAL | Status: DC

## 2015-06-07 MED ORDER — LOVASTATIN 40 MG PO TABS: 40.0000 mg | ORAL_TABLET | Freq: Every day | ORAL | Status: DC

## 2015-06-11 ENCOUNTER — Other Ambulatory Visit: Payer: Self-pay

## 2015-06-11 NOTE — Telephone Encounter (Signed)
Please advise refills.  Omeprazole was not on her current med list.  thanks

## 2015-06-12 MED ORDER — DILTIAZEM HCL ER 240 MG PO CP24: 240.0000 mg | ORAL_CAPSULE | Freq: Every day | ORAL | Status: DC

## 2015-06-12 NOTE — Telephone Encounter (Signed)
It appears it was refilled last year for one year.  I am not sure why omeprazole off medication list.  Please confirm with pt if taking and confirm has been taking regularly.  If so, ok to refill both medications x 6 months.

## 2015-06-12 NOTE — Telephone Encounter (Signed)
Spoke with the patient, she is not taking the omeprazole anymore.  I have removed it and refilled the other one for 6 months per your request. thanks

## 2015-07-16 DIAGNOSIS — Z79899 Other long term (current) drug therapy: Secondary | ICD-10-CM | POA: Diagnosis not present

## 2015-07-16 DIAGNOSIS — M0579 Rheumatoid arthritis with rheumatoid factor of multiple sites without organ or systems involvement: Secondary | ICD-10-CM | POA: Diagnosis not present

## 2015-08-06 DIAGNOSIS — M5431 Sciatica, right side: Secondary | ICD-10-CM | POA: Diagnosis not present

## 2015-08-06 DIAGNOSIS — M5441 Lumbago with sciatica, right side: Secondary | ICD-10-CM | POA: Diagnosis not present

## 2015-08-28 ENCOUNTER — Other Ambulatory Visit: Payer: Self-pay | Admitting: Orthopedic Surgery

## 2015-08-28 DIAGNOSIS — M5441 Lumbago with sciatica, right side: Secondary | ICD-10-CM

## 2015-08-30 ENCOUNTER — Ambulatory Visit
Admission: RE | Admit: 2015-08-30 | Discharge: 2015-08-30 | Disposition: A | Discharge status: Home / Self Care | Payer: Medicare Other | Source: Ambulatory Visit | Attending: Orthopedic Surgery | Admitting: Orthopedic Surgery

## 2015-08-30 DIAGNOSIS — M5441 Lumbago with sciatica, right side: Secondary | ICD-10-CM | POA: Diagnosis not present

## 2015-08-30 DIAGNOSIS — M4806 Spinal stenosis, lumbar region: Secondary | ICD-10-CM | POA: Insufficient documentation

## 2015-08-30 DIAGNOSIS — Z9889 Other specified postprocedural states: Secondary | ICD-10-CM | POA: Diagnosis not present

## 2015-08-30 DIAGNOSIS — M5137 Other intervertebral disc degeneration, lumbosacral region: Secondary | ICD-10-CM | POA: Diagnosis not present

## 2015-09-04 DIAGNOSIS — M5441 Lumbago with sciatica, right side: Secondary | ICD-10-CM | POA: Diagnosis not present

## 2015-09-04 DIAGNOSIS — M4807 Spinal stenosis, lumbosacral region: Secondary | ICD-10-CM | POA: Diagnosis not present

## 2015-09-05 ENCOUNTER — Ambulatory Visit: Payer: Medicare Other | Admitting: Internal Medicine

## 2015-09-10 DIAGNOSIS — M5430 Sciatica, unspecified side: Secondary | ICD-10-CM | POA: Diagnosis not present

## 2015-09-10 DIAGNOSIS — M0609 Rheumatoid arthritis without rheumatoid factor, multiple sites: Secondary | ICD-10-CM | POA: Diagnosis not present

## 2015-09-10 DIAGNOSIS — M65331 Trigger finger, right middle finger: Secondary | ICD-10-CM | POA: Diagnosis not present

## 2015-09-10 DIAGNOSIS — M549 Dorsalgia, unspecified: Secondary | ICD-10-CM | POA: Diagnosis not present

## 2015-09-10 DIAGNOSIS — M0579 Rheumatoid arthritis with rheumatoid factor of multiple sites without organ or systems involvement: Secondary | ICD-10-CM | POA: Diagnosis not present

## 2015-09-28 DIAGNOSIS — M5416 Radiculopathy, lumbar region: Secondary | ICD-10-CM | POA: Diagnosis not present

## 2015-09-28 DIAGNOSIS — M4806 Spinal stenosis, lumbar region: Secondary | ICD-10-CM | POA: Diagnosis not present

## 2015-09-28 DIAGNOSIS — M5136 Other intervertebral disc degeneration, lumbar region: Secondary | ICD-10-CM | POA: Diagnosis not present

## 2015-10-06 ENCOUNTER — Encounter: Payer: Self-pay | Admitting: Emergency Medicine

## 2015-10-06 ENCOUNTER — Inpatient Hospital Stay
Admission: EM | Admit: 2015-10-06 | Discharge: 2015-10-08 | DRG: 872 | Disposition: A | Discharge status: Home / Self Care | Payer: Medicare Other | Attending: Specialist | Admitting: Specialist

## 2015-10-06 ENCOUNTER — Emergency Department: Payer: Medicare Other

## 2015-10-06 DIAGNOSIS — I959 Hypotension, unspecified: Secondary | ICD-10-CM | POA: Diagnosis present

## 2015-10-06 DIAGNOSIS — Z841 Family history of disorders of kidney and ureter: Secondary | ICD-10-CM

## 2015-10-06 DIAGNOSIS — M069 Rheumatoid arthritis, unspecified: Secondary | ICD-10-CM | POA: Diagnosis present

## 2015-10-06 DIAGNOSIS — K219 Gastro-esophageal reflux disease without esophagitis: Secondary | ICD-10-CM | POA: Diagnosis present

## 2015-10-06 DIAGNOSIS — M8580 Other specified disorders of bone density and structure, unspecified site: Secondary | ICD-10-CM | POA: Diagnosis present

## 2015-10-06 DIAGNOSIS — E861 Hypovolemia: Secondary | ICD-10-CM | POA: Diagnosis not present

## 2015-10-06 DIAGNOSIS — R112 Nausea with vomiting, unspecified: Secondary | ICD-10-CM | POA: Diagnosis not present

## 2015-10-06 DIAGNOSIS — Z886 Allergy status to analgesic agent status: Secondary | ICD-10-CM

## 2015-10-06 DIAGNOSIS — M064 Inflammatory polyarthropathy: Secondary | ICD-10-CM | POA: Diagnosis present

## 2015-10-06 DIAGNOSIS — Z90710 Acquired absence of both cervix and uterus: Secondary | ICD-10-CM

## 2015-10-06 DIAGNOSIS — Z8249 Family history of ischemic heart disease and other diseases of the circulatory system: Secondary | ICD-10-CM

## 2015-10-06 DIAGNOSIS — Z7951 Long term (current) use of inhaled steroids: Secondary | ICD-10-CM

## 2015-10-06 DIAGNOSIS — Z8041 Family history of malignant neoplasm of ovary: Secondary | ICD-10-CM

## 2015-10-06 DIAGNOSIS — N390 Urinary tract infection, site not specified: Secondary | ICD-10-CM | POA: Diagnosis not present

## 2015-10-06 DIAGNOSIS — E785 Hyperlipidemia, unspecified: Secondary | ICD-10-CM | POA: Diagnosis present

## 2015-10-06 DIAGNOSIS — Z833 Family history of diabetes mellitus: Secondary | ICD-10-CM

## 2015-10-06 DIAGNOSIS — A419 Sepsis, unspecified organism: Secondary | ICD-10-CM | POA: Diagnosis not present

## 2015-10-06 DIAGNOSIS — Z888 Allergy status to other drugs, medicaments and biological substances status: Secondary | ICD-10-CM

## 2015-10-06 DIAGNOSIS — Z87442 Personal history of urinary calculi: Secondary | ICD-10-CM

## 2015-10-06 DIAGNOSIS — K580 Irritable bowel syndrome with diarrhea: Secondary | ICD-10-CM | POA: Diagnosis present

## 2015-10-06 DIAGNOSIS — E871 Hypo-osmolality and hyponatremia: Secondary | ICD-10-CM | POA: Diagnosis present

## 2015-10-06 DIAGNOSIS — E876 Hypokalemia: Secondary | ICD-10-CM | POA: Diagnosis present

## 2015-10-06 DIAGNOSIS — Z79899 Other long term (current) drug therapy: Secondary | ICD-10-CM

## 2015-10-06 DIAGNOSIS — Z9889 Other specified postprocedural states: Secondary | ICD-10-CM

## 2015-10-06 DIAGNOSIS — I10 Essential (primary) hypertension: Secondary | ICD-10-CM | POA: Diagnosis present

## 2015-10-06 DIAGNOSIS — R000 Tachycardia, unspecified: Secondary | ICD-10-CM | POA: Diagnosis present

## 2015-10-06 LAB — URINALYSIS COMPLETE WITH MICROSCOPIC (ARMC ONLY)
Bilirubin Urine: NEGATIVE
Glucose, UA: NEGATIVE mg/dL
KETONES UR: NEGATIVE mg/dL
Nitrite: POSITIVE — AB
PH: 7 (ref 5.0–8.0)
PROTEIN: 100 mg/dL — AB
SQUAMOUS EPITHELIAL / LPF: NONE SEEN
Specific Gravity, Urine: 1.011 (ref 1.005–1.030)

## 2015-10-06 LAB — CBC WITH DIFFERENTIAL/PLATELET
BASOS ABS: 0.1 10*3/uL (ref 0–0.1)
Eosinophils Absolute: 0 10*3/uL (ref 0–0.7)
HEMATOCRIT: 30.2 % — AB (ref 35.0–47.0)
Hemoglobin: 10.2 g/dL — ABNORMAL LOW (ref 12.0–16.0)
Lymphs Abs: 1.1 10*3/uL (ref 1.0–3.6)
MCH: 31.3 pg (ref 26.0–34.0)
MCHC: 33.9 g/dL (ref 32.0–36.0)
MCV: 92.2 fL (ref 80.0–100.0)
MONO ABS: 2.6 10*3/uL — AB (ref 0.2–0.9)
Monocytes Relative: 11 %
Neutro Abs: 19.7 10*3/uL — ABNORMAL HIGH (ref 1.4–6.5)
Neutrophils Relative %: 84 %
Platelets: 319 10*3/uL (ref 150–440)
RBC: 3.27 MIL/uL — ABNORMAL LOW (ref 3.80–5.20)
RDW: 14.9 % — AB (ref 11.5–14.5)
WBC: 23.6 10*3/uL — ABNORMAL HIGH (ref 3.6–11.0)

## 2015-10-06 LAB — COMPREHENSIVE METABOLIC PANEL
ALBUMIN: 3.4 g/dL — AB (ref 3.5–5.0)
ALT: 69 U/L — ABNORMAL HIGH (ref 14–54)
ANION GAP: 11 (ref 5–15)
AST: 62 U/L — ABNORMAL HIGH (ref 15–41)
Alkaline Phosphatase: 102 U/L (ref 38–126)
BILIRUBIN TOTAL: 1 mg/dL (ref 0.3–1.2)
BUN: 29 mg/dL — AB (ref 6–20)
CO2: 22 mmol/L (ref 22–32)
CREATININE: 1.06 mg/dL — AB (ref 0.44–1.00)
Calcium: 8.9 mg/dL (ref 8.9–10.3)
Chloride: 91 mmol/L — ABNORMAL LOW (ref 101–111)
GFR calc Af Amer: 56 mL/min — ABNORMAL LOW (ref >60)
GFR calc non Af Amer: 48 mL/min — ABNORMAL LOW (ref >60)
GLUCOSE: 128 mg/dL — AB (ref 65–99)
Potassium: 3.1 mmol/L — ABNORMAL LOW (ref 3.5–5.1)
Sodium: 124 mmol/L — ABNORMAL LOW (ref 135–145)
Total Protein: 6.2 g/dL — ABNORMAL LOW (ref 6.5–8.1)

## 2015-10-06 LAB — LACTIC ACID, PLASMA: LACTIC ACID, VENOUS: 0.9 mmol/L (ref 0.5–2.0)

## 2015-10-06 MED ORDER — DEXTROSE 5 % IV SOLN
2.0000 g | Freq: Once | INTRAVENOUS | Status: AC
  Administered 2015-10-07: 2 g via INTRAVENOUS
  Filled 2015-10-06: qty 2

## 2015-10-06 MED ORDER — SODIUM CHLORIDE 0.9 % IV BOLUS (SEPSIS)
1000.0000 mL | Freq: Once | INTRAVENOUS | Status: AC
  Administered 2015-10-06: 1000 mL via INTRAVENOUS

## 2015-10-06 MED ORDER — SODIUM CHLORIDE 0.9 % IV BOLUS (SEPSIS)
250.0000 mL | Freq: Once | INTRAVENOUS | Status: AC
  Administered 2015-10-07: 250 mL via INTRAVENOUS

## 2015-10-06 MED ORDER — ACETAMINOPHEN 325 MG PO TABS
650.0000 mg | ORAL_TABLET | Freq: Once | ORAL | Status: AC
  Administered 2015-10-06: 650 mg via ORAL
  Filled 2015-10-06: qty 2

## 2015-10-06 MED ORDER — ONDANSETRON HCL 4 MG/2ML IJ SOLN
4.0000 mg | Freq: Once | INTRAMUSCULAR | Status: AC
  Administered 2015-10-06: 4 mg via INTRAVENOUS
  Filled 2015-10-06: qty 2

## 2015-10-06 MED ORDER — SODIUM CHLORIDE 0.9 % IV BOLUS (SEPSIS): 500.0000 mL | Freq: Once | INTRAVENOUS | Status: DC

## 2015-10-06 MED ORDER — SODIUM CHLORIDE 0.9 % IV BOLUS (SEPSIS)
500.0000 mL | Freq: Once | INTRAVENOUS | Status: AC
  Administered 2015-10-06: 500 mL via INTRAVENOUS

## 2015-10-06 NOTE — ED Notes (Signed)
Pt. Reports n/v/d for the past 3 days.  Pt. Reports dysuria.  Pt. Also reports hx of diverticulitis.

## 2015-10-06 NOTE — ED Notes (Signed)
Pharmacy called regarding Rocephin order and to verify amount of IV fluids ordered for pt; dose overlaps fluids already given prior to this nurse taking over pt's care

## 2015-10-06 NOTE — ED Provider Notes (Signed)
Time Seen: Approximately *2150 I have reviewed the triage notes  Chief Complaint: Fever and Nausea   History of Present Illness: Stacey Snyder is a 82 y.o. female who presents with recent onset of a febrile illness at home. The patient has a significant history of osteopenia and is currently on Remeron. She's been also receiving epidural injections due to back pain. Patient denies any new back pain or weakness to she's developed a fever and some feelings of nausea, etc. She states that it was felt that she had diverticulitis a week ago but she was not placed on any antibiotic therapy and apparently she didn't want it to interrupt with her getting her epidural injections. He describes one bowel movement with bright red blood in the stool. She denies any new flank pain but has noticed pain with urination. She denies any hematuria. No obvious chest pain or shortness of breath or productive cough.   Past Medical History  Diagnosis Date  . Hypertension   . Hypercholesterolemia   . Nephrolithiasis   . Fibrocystic breast disease   . Diverticulosis   . Pancreatitis 2007    s/p ERCP  . IBS (irritable bowel syndrome)   . Inflammatory arthritis (HCC)     positive anti-CCP abs, s/p prednisone, MTX, Remicade  . Osteopenia     GI upset with Fosamax  . GERD (gastroesophageal reflux disease)   . Hypertension     Patient Active Problem List   Diagnosis Date Noted  . Lip lesion 04/23/2015  . Scalp lesion 04/23/2015  . Loss of weight 04/23/2015  . Stress 04/23/2015  . Left shoulder pain 12/20/2014  . UTI (urinary tract infection) 08/20/2014  . Health care maintenance 08/20/2014  . Skin lesion 04/17/2014  . Fatigue 04/13/2014  . Shoulder pain, right 12/17/2013  . Trigger finger 08/21/2013  . Left hip pain 04/17/2013  . GERD (gastroesophageal reflux disease) 04/03/2012  . Inflammatory arthritis ([place]) 03/30/2012  . Diverticulosis 03/30/2012  . Osteopenia 03/30/2012  . Hypertension  03/30/2012  . Hypercholesterolemia 03/30/2012    Past Surgical History  Procedure Laterality Date  . Lumbar laminectomy  1983  . Abdominal hysterectomy  1993    ovaries not removed  . Lithotripsy    . Trigger finger release      right ring finger  . Breast biopsy Left 1984    negative  . Breast cyst excision Left 2010    negative    Past Surgical History  Procedure Laterality Date  . Lumbar laminectomy  1983  . Abdominal hysterectomy  1993    ovaries not removed  . Lithotripsy    . Trigger finger release      right ring finger  . Breast biopsy Left 1984    negative  . Breast cyst excision Left 2010    negative    Current Outpatient Rx  Name  Route  Sig  Dispense  Refill  . acyclovir (ZOVIRAX) 400 MG tablet      Take one tablet 5x/day   50 tablet   0   . acyclovir ointment (ZOVIRAX) 5 %   Topical   Apply 1 application topically 4 (four) times daily. Apply to lip   15 g   0   . calcium carbonate (OS-CAL) 600 MG TABS   Oral   Take 600 mg by mouth daily.         . Cholecalciferol (VITAMIN D) 2000 UNITS tablet   Oral   Take 2,000 Units by mouth daily.         Marland Kitchen  diltiazem (DILACOR XR) 240 MG 24 hr capsule   Oral   Take 1 capsule (240 mg total) by mouth daily.   90 capsule   1   . fish oil-omega-3 fatty acids 1000 MG capsule      One capsule daily         . fluticasone (FLONASE) 50 MCG/ACT nasal spray      USE TWO SPRAY(S) IN EACH NOSTRIL ONCE DAILY   16 g   6   . folic acid (FOLVITE) 1 MG tablet   Oral   Take 1 mg by mouth 2 (two) times daily.          . InFLIXimab (REMICADE IV)   Intravenous   Inject into the vein. Per Dr Jefm Bryant         . losartan (COZAAR) 100 MG tablet   Oral   Take 1 tablet (100 mg total) by mouth daily.   90 tablet   3   . lovastatin (MEVACOR) 40 MG tablet   Oral   Take 1 tablet (40 mg total) by mouth daily.   90 tablet   3   . magnesium oxide (MAG-OX) 400 (241.3 MG) MG tablet   Oral   Take 400 mg  by mouth daily.         . methotrexate 25 MG/ML SOLN      Administer .6 mls subcutaneously once a week         . mupirocin ointment (BACTROBAN) 2 %      Apply topically to scalp twice a day   22 g   0   . penciclovir (DENAVIR) 1 % cream   Topical   Apply 1 application topically every 2 (two) hours. For Fever Blister   1.5 g   0   . ranitidine (ZANTAC) 150 MG tablet   Oral   Take 150 mg by mouth daily.         Marland Kitchen triamterene-hydrochlorothiazide (MAXZIDE-25) 37.5-25 MG tablet   Oral   Take 1 tablet by mouth daily.   90 tablet   3     Allergies:  Codeine; Flagyl; Skelaxin; and Tenormin  Family History: Family History  Problem Relation Age of Onset  . Ovarian cancer Mother   . Renal Disease Mother   . Heart disease Mother   . Diabetes Mother   . Hypertension Mother     Social History: Social History  Substance Use Topics  . Smoking status: Never Smoker   . Smokeless tobacco: Never Used  . Alcohol Use: No     Review of Systems:   10 point review of systems was performed and was otherwise negative:  Constitutional: Fever last 24-48 hours Eyes: No visual disturbances ENT: No sore throat, ear pain Cardiac: No chest pain Respiratory: No shortness of breath, wheezing, or stridor Abdomen: No abdominal pain, no vomiting, No diarrhea Endocrine: No weight loss, No night sweats Extremities: No peripheral edema, cyanosis Skin: No rashes, easy bruising Neurologic: No focal weakness, trouble with speech or swollowing Urologic: No dysuria without hematuria or urinary frequency*  Physical Exam:  ED Triage Vitals  Enc Vitals Group     BP 10/06/15 2110 99/74 mmHg     Pulse Rate 10/06/15 2110 80     Resp 10/06/15 2110 20     Temp 10/06/15 2110 101.7 F (38.7 C)     Temp Source 10/06/15 2110 Oral     SpO2 10/06/15 2110 95 %     Weight 10/06/15 2110 115  lb (52.164 kg)     Height 10/06/15 2110 5\' 1"  (1.549 m)     Head Cir --      Peak Flow --      Pain  Score --      Pain Loc --      Pain Edu? --      Excl. in [place]? --     General: Awake , Alert , and Oriented times 3; GCS 15 Head: Normal cephalic , atraumatic Eyes: Pupils equal , round, reactive to light Nose/Throat: No nasal drainage, patent upper airway without erythema or exudate.  Neck: Supple, Full range of motion, No anterior adenopathy or palpable thyroid masses Lungs: Clear to ascultation without wheezes , rhonchi, or rales Heart: Regular rate, regular rhythm without murmurs , gallops , or rubs Abdomen: Soft, non tender without rebound, guarding , or rigidity; bowel sounds positive and symmetric in all 4 quadrants. No organomegaly .        Extremities: 2 plus symmetric pulses. No edema, clubbing or cyanosis Neurologic: normal ambulation, Motor symmetric without deficits, sensory intact Skin: warm, dry, no rashes Epidural injections sites look normal  Labs:   All laboratory work was reviewed including any pertinent negatives or positives listed below:  Labs Reviewed  CULTURE, BLOOD (ROUTINE X 2)  CULTURE, BLOOD (ROUTINE X 2)  URINE CULTURE  CBC WITH DIFFERENTIAL/PLATELET  COMPREHENSIVE METABOLIC PANEL  URINALYSIS COMPLETEWITH MICROSCOPIC ([facility])  LACTIC ACID, PLASMA  LACTIC ACID, PLASMA  Review of laboratory work shows a sodium of 124 and some slightly elevated liver function panels which may be medication related. White count significantly elevated at 23.6 with a neutrophilic shift. Urinalysis was positive for urinary tract infection  EKG:  ED ECG REPORT I, Daymon Larsen, the attending physician, personally viewed and interpreted this ECG.  Date: 10/06/2015 EKG Time: 2221 Rate: 81 Rhythm: normal sinus rhythm QRS Axis: normal Intervals: normal ST/T Wave abnormalities: normal Conduction Disturbances: none Narrative Interpretation: unremarkable No acute ischemic changes   Radiology:  CLINICAL DATA: 82 year old female with nausea, vomiting, diarrhea and  dysuria for 3 days.  EXAM: CHEST 2 VIEW  COMPARISON: 10/02/2005  FINDINGS: The cardiomediastinal silhouette is unremarkable.  There is no evidence of focal airspace disease, pulmonary edema, suspicious pulmonary nodule/mass, pleural effusion, or pneumothorax. No acute bony abnormalities are identified.  IMPRESSION: No active cardiopulmonary disease.     I personally reviewed the radiologic studies     Critical Care:  CRITICAL CARE Performed by: Daymon Larsen   Total critical care time: 37 minutes  Critical care time was exclusive of separately billable procedures and treating other patients.  Critical care was necessary to treat or prevent imminent or life-threatening deterioration.  Critical care was time spent personally by me on the following activities: development of treatment plan with patient and/or surrogate as well as nursing, discussions with consultants, evaluation of patient's response to treatment, examination of patient, obtaining history from patient or surrogate, ordering and performing treatments and interventions, ordering and review of laboratory studies, ordering and review of radiographic studies, pulse oximetry and re-evaluation of patient's condition. Initiation evaluation of a code sepsis    ED Course:  Guaiac of the patient's stool was positive Patient was started on IV Rocephin for urosepsis. She was continued with IV fluids. She was given Tylenol for fever. Blood pressure reached a systolic of [id number] and a normal heart rate.   Assessment:  Sepsis Urinary tract infection Immunocompromise to     Plan:  Inpatient management.           Daymon Larsen, MD 10/06/15 [phone]

## 2015-10-06 NOTE — ED Notes (Signed)
Hemoccult positive 

## 2015-10-07 DIAGNOSIS — Z833 Family history of diabetes mellitus: Secondary | ICD-10-CM | POA: Diagnosis not present

## 2015-10-07 DIAGNOSIS — Z841 Family history of disorders of kidney and ureter: Secondary | ICD-10-CM | POA: Diagnosis not present

## 2015-10-07 DIAGNOSIS — Z90710 Acquired absence of both cervix and uterus: Secondary | ICD-10-CM | POA: Diagnosis not present

## 2015-10-07 DIAGNOSIS — E871 Hypo-osmolality and hyponatremia: Secondary | ICD-10-CM | POA: Diagnosis present

## 2015-10-07 DIAGNOSIS — K219 Gastro-esophageal reflux disease without esophagitis: Secondary | ICD-10-CM | POA: Diagnosis present

## 2015-10-07 DIAGNOSIS — E876 Hypokalemia: Secondary | ICD-10-CM | POA: Diagnosis present

## 2015-10-07 DIAGNOSIS — Z8249 Family history of ischemic heart disease and other diseases of the circulatory system: Secondary | ICD-10-CM | POA: Diagnosis not present

## 2015-10-07 DIAGNOSIS — I959 Hypotension, unspecified: Secondary | ICD-10-CM | POA: Diagnosis present

## 2015-10-07 DIAGNOSIS — A419 Sepsis, unspecified organism: Secondary | ICD-10-CM | POA: Diagnosis present

## 2015-10-07 DIAGNOSIS — Z9889 Other specified postprocedural states: Secondary | ICD-10-CM | POA: Diagnosis not present

## 2015-10-07 DIAGNOSIS — E861 Hypovolemia: Secondary | ICD-10-CM | POA: Diagnosis present

## 2015-10-07 DIAGNOSIS — R000 Tachycardia, unspecified: Secondary | ICD-10-CM | POA: Diagnosis present

## 2015-10-07 DIAGNOSIS — Z87442 Personal history of urinary calculi: Secondary | ICD-10-CM | POA: Diagnosis not present

## 2015-10-07 DIAGNOSIS — N390 Urinary tract infection, site not specified: Secondary | ICD-10-CM | POA: Diagnosis present

## 2015-10-07 DIAGNOSIS — Z886 Allergy status to analgesic agent status: Secondary | ICD-10-CM | POA: Diagnosis not present

## 2015-10-07 DIAGNOSIS — I10 Essential (primary) hypertension: Secondary | ICD-10-CM | POA: Diagnosis present

## 2015-10-07 DIAGNOSIS — M064 Inflammatory polyarthropathy: Secondary | ICD-10-CM | POA: Diagnosis present

## 2015-10-07 DIAGNOSIS — Z79899 Other long term (current) drug therapy: Secondary | ICD-10-CM | POA: Diagnosis not present

## 2015-10-07 DIAGNOSIS — M8580 Other specified disorders of bone density and structure, unspecified site: Secondary | ICD-10-CM | POA: Diagnosis present

## 2015-10-07 DIAGNOSIS — Z8041 Family history of malignant neoplasm of ovary: Secondary | ICD-10-CM | POA: Diagnosis not present

## 2015-10-07 DIAGNOSIS — K580 Irritable bowel syndrome with diarrhea: Secondary | ICD-10-CM | POA: Diagnosis present

## 2015-10-07 DIAGNOSIS — Z7951 Long term (current) use of inhaled steroids: Secondary | ICD-10-CM | POA: Diagnosis not present

## 2015-10-07 DIAGNOSIS — E785 Hyperlipidemia, unspecified: Secondary | ICD-10-CM | POA: Diagnosis present

## 2015-10-07 DIAGNOSIS — Z888 Allergy status to other drugs, medicaments and biological substances status: Secondary | ICD-10-CM | POA: Diagnosis not present

## 2015-10-07 LAB — BASIC METABOLIC PANEL
Anion gap: 7 (ref 5–15)
BUN: 24 mg/dL — ABNORMAL HIGH (ref 6–20)
CHLORIDE: 99 mmol/L — AB (ref 101–111)
CO2: 23 mmol/L (ref 22–32)
CREATININE: 0.93 mg/dL (ref 0.44–1.00)
Calcium: 8 mg/dL — ABNORMAL LOW (ref 8.9–10.3)
GFR, EST NON AFRICAN AMERICAN: 57 mL/min — AB (ref >60)
Glucose, Bld: 129 mg/dL — ABNORMAL HIGH (ref 65–99)
POTASSIUM: 3.2 mmol/L — AB (ref 3.5–5.1)
SODIUM: 129 mmol/L — AB (ref 135–145)

## 2015-10-07 LAB — LACTIC ACID, PLASMA: LACTIC ACID, VENOUS: 0.4 mmol/L — AB (ref 0.5–2.0)

## 2015-10-07 LAB — CBC
HCT: 26.2 % — ABNORMAL LOW (ref 35.0–47.0)
Hemoglobin: 8.9 g/dL — ABNORMAL LOW (ref 12.0–16.0)
MCH: 31.7 pg (ref 26.0–34.0)
MCHC: 34.1 g/dL (ref 32.0–36.0)
MCV: 93.1 fL (ref 80.0–100.0)
PLATELETS: 287 10*3/uL (ref 150–440)
RBC: 2.82 MIL/uL — AB (ref 3.80–5.20)
RDW: 14.6 % — ABNORMAL HIGH (ref 11.5–14.5)
WBC: 18.2 10*3/uL — AB (ref 3.6–11.0)

## 2015-10-07 MED ORDER — ACETAMINOPHEN 650 MG RE SUPP: 650.0000 mg | Freq: Four times a day (QID) | RECTAL | Status: DC | PRN

## 2015-10-07 MED ORDER — ONDANSETRON HCL 4 MG PO TABS: 4.0000 mg | ORAL_TABLET | Freq: Four times a day (QID) | ORAL | Status: DC | PRN

## 2015-10-07 MED ORDER — ENOXAPARIN SODIUM 40 MG/0.4ML ~~LOC~~ SOLN
40.0000 mg | SUBCUTANEOUS | Status: DC
  Administered 2015-10-07: 40 mg via SUBCUTANEOUS
  Filled 2015-10-07: qty 0.4

## 2015-10-07 MED ORDER — SODIUM CHLORIDE 0.9 % IV SOLN
INTRAVENOUS | Status: AC
  Administered 2015-10-07: 02:00:00 via INTRAVENOUS

## 2015-10-07 MED ORDER — PRAVASTATIN SODIUM 20 MG PO TABS
40.0000 mg | ORAL_TABLET | Freq: Every day | ORAL | Status: DC
  Administered 2015-10-07: 40 mg via ORAL
  Filled 2015-10-07: qty 2

## 2015-10-07 MED ORDER — ACETAMINOPHEN 325 MG PO TABS
650.0000 mg | ORAL_TABLET | Freq: Four times a day (QID) | ORAL | Status: DC | PRN
  Administered 2015-10-07 – 2015-10-08 (×3): 650 mg via ORAL
  Filled 2015-10-07 (×3): qty 2

## 2015-10-07 MED ORDER — PANTOPRAZOLE SODIUM 40 MG PO TBEC
40.0000 mg | DELAYED_RELEASE_TABLET | Freq: Every day | ORAL | Status: DC
  Administered 2015-10-07 – 2015-10-08 (×2): 40 mg via ORAL
  Filled 2015-10-07 (×2): qty 1

## 2015-10-07 MED ORDER — DEXTROSE 5 % IV SOLN
2.0000 g | INTRAVENOUS | Status: DC
  Administered 2015-10-07: 2 g via INTRAVENOUS
  Filled 2015-10-07 (×2): qty 2

## 2015-10-07 MED ORDER — DILTIAZEM HCL ER COATED BEADS 120 MG PO CP24
240.0000 mg | ORAL_CAPSULE | Freq: Every day | ORAL | Status: DC
  Administered 2015-10-07 – 2015-10-08 (×2): 240 mg via ORAL
  Filled 2015-10-07 (×2): qty 2

## 2015-10-07 MED ORDER — SODIUM CHLORIDE 0.9% FLUSH
3.0000 mL | Freq: Two times a day (BID) | INTRAVENOUS | Status: DC
  Administered 2015-10-07 (×2): 3 mL via INTRAVENOUS

## 2015-10-07 MED ORDER — ONDANSETRON HCL 4 MG/2ML IJ SOLN
4.0000 mg | Freq: Four times a day (QID) | INTRAMUSCULAR | Status: DC | PRN
Start: 2015-10-07 — End: 2015-10-08

## 2015-10-07 NOTE — H&P (Signed)
[facility] [place] NAME: Stacey Snyder    MR#:  [id number]  DATE OF BIRTH:  [birth year]  DATE OF ADMISSION:  10/06/2015  PRIMARY CARE PHYSICIAN: Einar Pheasant, MD   REQUESTING/REFERRING PHYSICIAN: Marcelene Butte, MD  CHIEF COMPLAINT:   Chief Complaint  Patient presents with  . Fever    Pt. reports n/v/d for the past 3 days.  . Nausea    HISTORY OF PRESENT ILLNESS:  Stacey Snyder  is a 82 y.o. female who presents with 2-3 days of malaise, chills, dysuria. On evaluation in the ED patient is found to have leukocytosis, hypotension, and she was febrile. UA showed nitrite positive and too numerous to count white blood cells. Hospitals were called for admission for sepsis and UTI  PAST MEDICAL HISTORY:   Past Medical History  Diagnosis Date  . Hypertension   . Hypercholesterolemia   . Nephrolithiasis   . Fibrocystic breast disease   . Diverticulosis   . Pancreatitis 2007    s/p ERCP  . IBS (irritable bowel syndrome)   . Inflammatory arthritis (HCC)     positive anti-CCP abs, s/p prednisone, MTX, Remicade  . Osteopenia     GI upset with Fosamax  . GERD (gastroesophageal reflux disease)   . Hypertension     PAST SURGICAL HISTORY:   Past Surgical History  Procedure Laterality Date  . Lumbar laminectomy  1983  . Abdominal hysterectomy  1993    ovaries not removed  . Lithotripsy    . Trigger finger release      right ring finger  . Breast biopsy Left 1984    negative  . Breast cyst excision Left 2010    negative    SOCIAL HISTORY:   Social History  Substance Use Topics  . Smoking status: Never Smoker   . Smokeless tobacco: Never Used  . Alcohol Use: No    FAMILY HISTORY:   Family History  Problem Relation Age of Onset  . Ovarian cancer Mother   . Renal Disease Mother   . Heart disease Mother   . Diabetes Mother   . Hypertension Mother     DRUG ALLERGIES:   Allergies  Allergen Reactions  .  Codeine Nausea Only  . Flagyl [Metronidazole] Other (See Comments)    Caused tongue irritation and swelling  . Skelaxin [Metaxalone] Swelling  . Tenormin [Atenolol] Other (See Comments)    Questionable swelling    MEDICATIONS AT HOME:   Prior to Admission medications   Medication Sig Start Date End Date Taking? Authorizing Provider  acidophilus (RISAQUAD) CAPS capsule Take 4 capsules by mouth daily.   Yes Historical Provider, MD  acyclovir ointment (ZOVIRAX) 5 % Apply 1 application topically 4 (four) times daily. Apply to lip Patient taking differently: Apply 1 application topically 4 (four) times daily as needed (fever blister). Apply to lip 04/10/15  Yes Einar Pheasant, MD  Cholecalciferol (VITAMIN D) 2000 UNITS tablet Take 2,000 Units by mouth daily.   Yes Historical Provider, MD  diltiazem (DILACOR XR) 240 MG 24 hr capsule Take 1 capsule (240 mg total) by mouth daily. 06/12/15  Yes Einar Pheasant, MD  fluticasone (FLONASE) 50 MCG/ACT nasal spray USE TWO SPRAY(S) IN EACH NOSTRIL ONCE DAILY 08/07/14  Yes Einar Pheasant, MD  folic acid (FOLVITE) 1 MG tablet Take 1 mg by mouth 2 (two) times daily.    Yes Historical Provider, MD  InFLIXimab (REMICADE IV) Inject into the vein every 6 (six) weeks. Per  Dr Jefm Bryant   Yes Historical Provider, MD  losartan (COZAAR) 100 MG tablet Take 1 tablet (100 mg total) by mouth daily. 06/07/15  Yes Einar Pheasant, MD  lovastatin (MEVACOR) 40 MG tablet Take 1 tablet (40 mg total) by mouth daily. 06/07/15  Yes Einar Pheasant, MD  magnesium oxide (MAG-OX) 400 (241.3 MG) MG tablet Take 400 mg by mouth daily.   Yes Historical Provider, MD  methotrexate 25 MG/ML SOLN Inject 17.5 mg into the skin once a week. Patient takes 17.5 mg (0.7 ml) on Monday or Tuesday of each week.   Yes Historical Provider, MD  omeprazole (PRILOSEC) 20 MG capsule Take 20 mg by mouth daily.   Yes Historical Provider, MD  penciclovir (DENAVIR) 1 % cream Apply 1 application topically every 2 (two)  hours. For Fever Blister Patient taking differently: Apply 1 application topically every 2 (two) hours as needed (fever blister). For Fever Blister 04/09/15  Yes Einar Pheasant, MD  triamterene-hydrochlorothiazide (MAXZIDE-25) 37.5-25 MG tablet Take 1 tablet by mouth daily. 06/07/15  Yes Einar Pheasant, MD    REVIEW OF SYSTEMS:  Review of Systems  Constitutional: Positive for fever and chills. Negative for weight loss and malaise/fatigue.  HENT: Negative for ear pain, hearing loss and tinnitus.   Eyes: Negative for blurred vision, double vision, pain and redness.  Respiratory: Negative for cough, hemoptysis and shortness of breath.   Cardiovascular: Negative for chest pain, palpitations, orthopnea and leg swelling.  Gastrointestinal: Positive for nausea. Negative for vomiting, abdominal pain, diarrhea and constipation.  Genitourinary: Positive for dysuria. Negative for frequency and hematuria.  Musculoskeletal: Negative for back pain, joint pain and neck pain.  Skin:       No acne, rash, or lesions  Neurological: Negative for dizziness, tremors, focal weakness and weakness.  Endo/Heme/Allergies: Negative for polydipsia. Does not bruise/bleed easily.  Psychiatric/Behavioral: Negative for depression. The patient is not nervous/anxious and does not have insomnia.      VITAL SIGNS:   Filed Vitals:   10/06/15 2200 10/06/15 2249 10/06/15 2308 10/06/15 2330  BP: 140/49  106/43 119/49  Pulse: 77  71 67  Temp:  101.7 F (38.7 C) 99.1 F (37.3 C)   TempSrc:  Rectal Oral   Resp:   18 18  Height:      Weight:      SpO2: 99%  95% 95%   Wt Readings from Last 3 Encounters:  10/06/15 52.164 kg (115 lb)  04/18/15 53.751 kg (118 lb 8 oz)  12/18/14 54.704 kg (120 lb 9.6 oz)    PHYSICAL EXAMINATION:  Physical Exam  Vitals reviewed. Constitutional: She is oriented to person, place, and time. She appears well-developed and well-nourished. No distress.  HENT:  Head: Normocephalic and  atraumatic.  Mouth/Throat: Oropharynx is clear and moist.  Eyes: Conjunctivae and EOM are normal. Pupils are equal, round, and reactive to light. No scleral icterus.  Neck: Normal range of motion. Neck supple. No JVD present. No thyromegaly present.  Cardiovascular: Normal rate, regular rhythm and intact distal pulses.  Exam reveals no gallop and no friction rub.   No murmur heard. Respiratory: Effort normal and breath sounds normal. No respiratory distress. She has no wheezes. She has no rales.  GI: Soft. Bowel sounds are normal. She exhibits no distension. There is no tenderness.  Musculoskeletal: Normal range of motion. She exhibits no edema.  No arthritis, no gout  Lymphadenopathy:    She has no cervical adenopathy.  Neurological: She is alert and oriented to person,  place, and time. No cranial nerve deficit.  No dysarthria, no aphasia  Skin: Skin is warm and dry. No rash noted. No erythema.  Psychiatric: She has a normal mood and affect. Her behavior is normal. Judgment and thought content normal.    LABORATORY PANEL:   CBC  Recent Labs Lab 10/06/15 2210  WBC 23.6*  HGB 10.2*  HCT 30.2*  PLT 319   ------------------------------------------------------------------------------------------------------------------  Chemistries   Recent Labs Lab 10/06/15 2210  NA 124*  K 3.1*  CL 91*  CO2 22  GLUCOSE 128*  BUN 29*  CREATININE 1.06*  CALCIUM 8.9  AST 62*  ALT 69*  ALKPHOS 102  BILITOT 1.0   ------------------------------------------------------------------------------------------------------------------  Cardiac Enzymes No results for input(s): TROPONINI in the last 168 hours. ------------------------------------------------------------------------------------------------------------------  RADIOLOGY:  Dg Chest 2 View  10/06/2015  CLINICAL DATA:  82 year old female with nausea, vomiting, diarrhea and dysuria for 3 days. EXAM: CHEST  2 VIEW COMPARISON:   10/02/2005 FINDINGS: The cardiomediastinal silhouette is unremarkable. There is no evidence of focal airspace disease, pulmonary edema, suspicious pulmonary nodule/mass, pleural effusion, or pneumothorax. No acute bony abnormalities are identified. IMPRESSION: No active cardiopulmonary disease. Electronically Signed   By: Margarette Canada M.D.   On: 10/06/2015 22:06    EKG:   Orders placed or performed during the hospital encounter of 10/06/15  . ED EKG 12-Lead  . ED EKG 12-Lead  . EKG 12-Lead  . EKG 12-Lead    IMPRESSION AND PLAN:  Principal Problem:   Sepsis ([place]) - continue IV antibiotics which were started in the ED, lactic acid was normal, IV fluids for blood pressure support, blood and urine cultures sent. Active Problems:   UTI (urinary tract infection) - cultures and antibiotics as above   Hyponatremia - IV fluids for hydration, monitor for expected improvement   Inflammatory arthritis ([place]) - patient is on Remicade and methotrexate likely increasing her possibility for infection, treat with antibiotics as above.   Hypertension - hold home antihypertensives as the patient is order line hypotensive here, IV fluids for support as above   GERD (gastroesophageal reflux disease) - home dose PPI  All the records are reviewed and case discussed with ED provider. Management plans discussed with the patient and/or family.  DVT PROPHYLAXIS: SubQ lovenox  GI PROPHYLAXIS: PPI  ADMISSION STATUS: Inpatient  CODE STATUS: Full Code Status History    This patient does not have a recorded code status. Please follow your organizational policy for patients in this situation.      TOTAL TIME TAKING CARE OF THIS PATIENT: 45 minutes.    Daimon Kean FIELDING 10/07/2015, 12:16 AM  [facility]  Office  [phone]  CC: Primary care physician; Einar Pheasant, MD

## 2015-10-07 NOTE — Care Management Important Message (Signed)
Important Message  Patient Details  Name: Stacey Snyder MRN: [id number] Date of Birth: [birth year]   Medicare Important Message Given:  Yes    Chase Arnall A, RN 10/07/2015, 2:37 PM

## 2015-10-07 NOTE — Progress Notes (Signed)
[place] at [place] NAME: Wilbur Ha    MR#:  [id number]  DATE OF BIRTH:  November 16, 1932  SUBJECTIVE:   Patient here due to sepsis secondary to urinary tract infection. Feels better. Afebrile. No nausea vomiting today. Family at bedside.  REVIEW OF SYSTEMS:    Review of Systems  Constitutional: Negative for fever and chills.  HENT: Negative for congestion and tinnitus.   Eyes: Negative for blurred vision and double vision.  Respiratory: Negative for cough, shortness of breath and wheezing.   Cardiovascular: Negative for chest pain, orthopnea and PND.  Gastrointestinal: Negative for nausea, vomiting, abdominal pain and diarrhea.  Genitourinary: Negative for dysuria and hematuria.  Neurological: Negative for dizziness, sensory change and focal weakness.  All other systems reviewed and are negative.   Nutrition: Heart healthy Tolerating Diet: Yes Tolerating PT: Await evaluation   DRUG ALLERGIES:   Allergies  Allergen Reactions  . Codeine Nausea Only  . Flagyl [Metronidazole] Other (See Comments)    Caused tongue irritation and swelling  . Skelaxin [Metaxalone] Swelling  . Tenormin [Atenolol] Other (See Comments)    Questionable swelling    VITALS:  Blood pressure 132/51, pulse 98, temperature 98 F (36.7 C), temperature source Oral, resp. rate 16, height 5\' 1"  (1.549 m), weight 52.164 kg (115 lb), SpO2 96 %.  PHYSICAL EXAMINATION:   Physical Exam  GENERAL:  82 y.o.-year-old patient lying in the bed in no acute distress.  EYES: Pupils equal, round, reactive to light and accommodation. No scleral icterus. Extraocular muscles intact.  HEENT: Head atraumatic, normocephalic. Oropharynx and nasopharynx clear.  NECK:  Supple, no jugular venous distention. No thyroid enlargement, no tenderness.  LUNGS: Normal breath sounds bilaterally, no wheezing, rales, rhonchi. No use of accessory muscles of respiration.  CARDIOVASCULAR: S1,  S2 normal. No murmurs, rubs, or gallops.  ABDOMEN: Soft, nontender, nondistended. Bowel sounds present. No organomegaly or mass.  EXTREMITIES: No cyanosis, clubbing or edema b/l.    NEUROLOGIC: Cranial nerves II through XII are intact. No focal Motor or sensory deficits b/l.   PSYCHIATRIC: The patient is alert and oriented x 3.  SKIN: No obvious rash, lesion, or ulcer.    LABORATORY PANEL:   CBC  Recent Labs Lab 10/07/15 0438  WBC 18.2*  HGB 8.9*  HCT 26.2*  PLT 287   ------------------------------------------------------------------------------------------------------------------  Chemistries   Recent Labs Lab 10/06/15 2210 10/07/15 0438  NA 124* 129*  K 3.1* 3.2*  CL 91* 99*  CO2 22 23  GLUCOSE 128* 129*  BUN 29* 24*  CREATININE 1.06* 0.93  CALCIUM 8.9 8.0*  AST 62*  --   ALT 69*  --   ALKPHOS 102  --   BILITOT 1.0  --    ------------------------------------------------------------------------------------------------------------------  Cardiac Enzymes No results for input(s): TROPONINI in the last 168 hours. ------------------------------------------------------------------------------------------------------------------  RADIOLOGY:  Dg Chest 2 View  10/06/2015  CLINICAL DATA:  82 year old female with nausea, vomiting, diarrhea and dysuria for 3 days. EXAM: CHEST  2 VIEW COMPARISON:  10/02/2005 FINDINGS: The cardiomediastinal silhouette is unremarkable. There is no evidence of focal airspace disease, pulmonary edema, suspicious pulmonary nodule/mass, pleural effusion, or pneumothorax. No acute bony abnormalities are identified. IMPRESSION: No active cardiopulmonary disease. Electronically Signed   By: Margarette Canada M.D.   On: 10/06/2015 22:06     ASSESSMENT AND PLAN:   82 year old female with past medical history of hypertension, hyperlipidemia, history of nephrolithiasis, diverticulosis, internal bowel syndrome, rheumatoid arthritis, GERD who presented to  the  hospital due to nausea weakness and noted to have sepsis secondary to UTI.  1. Sepsis-patient will then given her elevated lactic acid, tachycardia and abnormal urinalysis. -Continue IV ceftriaxone, follow urine cultures, blood cultures -Afebrile, hemodynamically stable.  2. Urinary tract infection-cause of patient's sepsis. Continue IV ceftriaxone. Follow urine cultures.  3. Hyponatremia-hypovolemic in nature. Continue IV fluids and it's improving. -Continue to hold triamterene/HCTZ.  4. Hypokalemia-we'll continue supplement and repeat level in the morning. Check magnesium level.  5. Leukocytosis-secondary to sepsis/UTI. Improving with IV antibiotics.  6. Essential hypertension-continue Cardizem.  7. Hyperlipidemia-continue Pravachol.  8. GERD-continue Protonix.  9. History of rheumatoid arthritis-continue methotrexate, Remicade infusions as outpatient. -Follows with Dr. Jefm Bryant.    All the records are reviewed and case discussed with Care Management/Social Workerr. Management plans discussed with the patient, family and they are in agreement.  CODE STATUS: Full  DVT Prophylaxis: Lovenox  TOTAL TIME TAKING CARE OF THIS PATIENT: 30 minutes.   POSSIBLE D/C IN 1-2 DAYS, DEPENDING ON CLINICAL CONDITION.   Henreitta Leber M.D on 10/07/2015 at 12:24 PM  Between 7am to 6pm - Pager - [phone]  After 6pm go to [email or web address] - password [email or web address] [facility]  Office  [phone]  CC: Primary care physician; Einar Pheasant, MD

## 2015-10-07 NOTE — Progress Notes (Signed)
Pharmacy Antibiotic Note  Stacey Snyder is a 82 y.o. female admitted on 10/06/2015 with UTI.  Pharmacy has been consulted for ceftriaxone dosing.  Plan: Ceftriaxone 2 grams q 24 hours ordered.  Height: 5\' 1"  (154.9 cm) Weight: 115 lb (52.164 kg) IBW/kg (Calculated) : 47.8  Temp (24hrs), Avg:100.3 F (37.9 C), Min:98.5 F (36.9 C), Max:101.7 F (38.7 C)   Recent Labs Lab 10/06/15 2209 10/06/15 2210 10/07/15 0044  WBC  --  23.6*  --   CREATININE  --  1.06*  --   LATICACIDVEN 0.9  --  0.4*    Estimated Creatinine Clearance: 31.9 mL/min (by C-G formula based on Cr of 1.06).    Allergies  Allergen Reactions  . Codeine Nausea Only  . Flagyl [Metronidazole] Other (See Comments)    Caused tongue irritation and swelling  . Skelaxin [Metaxalone] Swelling  . Tenormin [Atenolol] Other (See Comments)    Questionable swelling    Antimicrobials this admission: ceftriaxone  >>    >>   Dose adjustments this admission:   Microbiology results: 6/3 BCx: pending 6/3 UCx: pending    6/3 CXR: no acute disease 6/3 UA: LE(tr)  NO2(+)  WBC TNTC  Thank you for allowing pharmacy to be a part of this patient's care.  Tavita Eastham S 10/07/2015 1:19 AM

## 2015-10-08 LAB — CBC
HEMATOCRIT: 26.8 % — AB (ref 35.0–47.0)
Hemoglobin: 9 g/dL — ABNORMAL LOW (ref 12.0–16.0)
MCH: 31.6 pg (ref 26.0–34.0)
MCHC: 33.6 g/dL (ref 32.0–36.0)
MCV: 94.2 fL (ref 80.0–100.0)
Platelets: 272 10*3/uL (ref 150–440)
RBC: 2.85 MIL/uL — ABNORMAL LOW (ref 3.80–5.20)
RDW: 14.6 % — AB (ref 11.5–14.5)
WBC: 13.5 10*3/uL — ABNORMAL HIGH (ref 3.6–11.0)

## 2015-10-08 LAB — BASIC METABOLIC PANEL
Anion gap: 7 (ref 5–15)
BUN: 17 mg/dL (ref 6–20)
CALCIUM: 7.9 mg/dL — AB (ref 8.9–10.3)
CO2: 22 mmol/L (ref 22–32)
CREATININE: 0.83 mg/dL (ref 0.44–1.00)
Chloride: 99 mmol/L — ABNORMAL LOW (ref 101–111)
GFR calc Af Amer: >60 mL/min (ref >60)
GFR calc non Af Amer: >60 mL/min (ref >60)
GLUCOSE: 102 mg/dL — AB (ref 65–99)
Potassium: 3.3 mmol/L — ABNORMAL LOW (ref 3.5–5.1)
Sodium: 128 mmol/L — ABNORMAL LOW (ref 135–145)

## 2015-10-08 MED ORDER — POTASSIUM CHLORIDE ER 20 MEQ PO TBCR: 10.0000 meq | EXTENDED_RELEASE_TABLET | Freq: Every day | ORAL | Status: DC

## 2015-10-08 MED ORDER — ACYCLOVIR 5 % EX OINT: 1.0000 "application " | TOPICAL_OINTMENT | Freq: Four times a day (QID) | CUTANEOUS | Status: DC | PRN

## 2015-10-08 MED ORDER — SULFAMETHOXAZOLE-TRIMETHOPRIM 800-160 MG PO TABS: 1.0000 | ORAL_TABLET | Freq: Two times a day (BID) | ORAL | Status: DC

## 2015-10-08 NOTE — Discharge Summary (Signed)
[facility] at [place] NAME: Stacey Snyder    MR#:  [id number]  DATE OF BIRTH:  08-29-1932  DATE OF ADMISSION:  10/06/2015 ADMITTING PHYSICIAN: Lance Coon, MD  DATE OF DISCHARGE: 10/08/2015  2:39 PM  PRIMARY CARE PHYSICIAN: Einar Pheasant, MD    ADMISSION DIAGNOSIS:  Sepsis, due to unspecified organism ([place]) [A41.9]  DISCHARGE DIAGNOSIS:  Principal Problem:   Sepsis ([place]) Active Problems:   Inflammatory arthritis ([place])   Hypertension   GERD (gastroesophageal reflux disease)   UTI (urinary tract infection)   Hyponatremia   SECONDARY DIAGNOSIS:   Past Medical History  Diagnosis Date  . Hypertension   . Hypercholesterolemia   . Nephrolithiasis   . Fibrocystic breast disease   . Diverticulosis   . Pancreatitis 2007    s/p ERCP  . IBS (irritable bowel syndrome)   . Inflammatory arthritis ([facility])     positive anti-CCP abs, s/p prednisone, MTX, Remicade  . Osteopenia     GI upset with Fosamax  . GERD (gastroesophageal reflux disease)   . Hypertension     HOSPITAL COURSE:   82 year old female with past medical history of hypertension, hyperlipidemia, history of nephrolithiasis, diverticulosis, internal bowel syndrome, rheumatoid arthritis, GERD who presented to the hospital due to nausea weakness and noted to have sepsis secondary to UTI.  1. Sepsis- pt. Ruled in on admission given her elevated Lactic acid, abnormal UA, fever, leukocytosis.  - the source was a UTI.  She was treated with IV fluids, appendectomy ceftriaxone for UTI. She has clinically improved. Her lactic acid has normalized, she is afebrile, hemodynamically stable. Her urine cultures is growing 100,000 colonies of Escherichia coli but the sensitivities are still pending. -Since she has clinically improved she is being discharged on oral Bactrim for a few more days.  2. Urinary tract infection-cause of patient's sepsis.  -On the hospital she was treated with IV  ceftriaxone, not being discharged on oral Bactrim.  3. Hyponatremia-hypovolemic in nature. This has significantly improved with IV fluids and can be further followed as an outpatient patient's diuretics were held but she can resume those upon discharge.  4. Hypokalemia-patient was discharged on some oral potassium supplements and it has improved with supplementation.  5. Leukocytosis-secondary to sepsis/UTI. This has improved and normalized with IV antibiotics.  6. Essential hypertension- she will continue Cardizem, Losartan, Trimaterene/HCTZ upon dishcarge.  7. Hyperlipidemia- she will resume her Lovastatin.   8. GERD- she will cont. Her Omeprazole.    9. History of rheumatoid arthritis- she will continue methotrexate, Remicade infusions as outpatient. - she will cont. Follow up with Dr. Jefm Bryant.   DISCHARGE CONDITIONS:   Stable.   CONSULTS OBTAINED:     DRUG ALLERGIES:   Allergies  Allergen Reactions  . Codeine Nausea Only  . Flagyl [Metronidazole] Other (See Comments)    Caused tongue irritation and swelling  . Skelaxin [Metaxalone] Swelling  . Tenormin [Atenolol] Other (See Comments)    Questionable swelling    DISCHARGE MEDICATIONS:   Discharge Medication List as of 10/08/2015  1:58 PM    START taking these medications   Details  potassium chloride 20 MEQ TBCR Take 10 mEq by mouth daily., Starting 10/08/2015, Until Discontinued, Print    sulfamethoxazole-trimethoprim (BACTRIM DS,SEPTRA DS) 800-160 MG tablet Take 1 tablet by mouth 2 (two) times daily., Starting 10/08/2015, Until Discontinued, Print      CONTINUE these medications which have CHANGED   Details  acyclovir ointment (ZOVIRAX) 5 % Apply  1 application topically 4 (four) times daily as needed (fever blister). Apply to lip, Starting 10/08/2015, Until Discontinued, Normal      CONTINUE these medications which have NOT CHANGED   Details  acidophilus (RISAQUAD) CAPS capsule Take 4 capsules by mouth daily.,  Until Discontinued, Historical Med    Cholecalciferol (VITAMIN D) 2000 UNITS tablet Take 2,000 Units by mouth daily., Until Discontinued, Historical Med    diltiazem (DILACOR XR) 240 MG 24 hr capsule Take 1 capsule (240 mg total) by mouth daily., Starting 06/12/2015, Until Discontinued, Normal    fluticasone (FLONASE) 50 MCG/ACT nasal spray USE TWO SPRAY(S) IN EACH NOSTRIL ONCE DAILY, Normal    folic acid (FOLVITE) 1 MG tablet Take 1 mg by mouth 2 (two) times daily. , Until Discontinued, Historical Med    InFLIXimab (REMICADE IV) Inject into the vein every 6 (six) weeks. Per Dr Jefm Bryant, Until Discontinued, Historical Med    losartan (COZAAR) 100 MG tablet Take 1 tablet (100 mg total) by mouth daily., Starting 06/07/2015, Until Discontinued, Normal    lovastatin (MEVACOR) 40 MG tablet Take 1 tablet (40 mg total) by mouth daily., Starting 06/07/2015, Until Discontinued, Normal    magnesium oxide (MAG-OX) 400 (241.3 MG) MG tablet Take 400 mg by mouth daily., Until Discontinued, Historical Med    methotrexate 25 MG/ML SOLN Inject 17.5 mg into the skin once a week. Patient takes 17.5 mg (0.7 ml) on Monday or Tuesday of each week., Until Discontinued, Historical Med    omeprazole (PRILOSEC) 20 MG capsule Take 20 mg by mouth daily., Until Discontinued, Historical Med    penciclovir (DENAVIR) 1 % cream Apply 1 application topically every 2 (two) hours. For Fever Blister, Starting 04/09/2015, Until Discontinued, Normal    triamterene-hydrochlorothiazide (MAXZIDE-25) 37.5-25 MG tablet Take 1 tablet by mouth daily., Starting 06/07/2015, Until Discontinued, Normal         DISCHARGE INSTRUCTIONS:   DIET:  Cardiac diet  DISCHARGE CONDITION:  Stable  ACTIVITY:  Activity as tolerated  OXYGEN:  Home Oxygen: No.   Oxygen Delivery: room air  DISCHARGE LOCATION:  home   If you experience worsening of your admission symptoms, develop shortness of breath, life threatening emergency, suicidal or  homicidal thoughts you must seek medical attention immediately by calling 911 or calling your MD immediately  if symptoms less severe.  You Must read complete instructions/literature along with all the possible adverse reactions/side effects for all the Medicines you take and that have been prescribed to you. Take any new Medicines after you have completely understood and accpet all the possible adverse reactions/side effects.   Please note  You were cared for by a hospitalist during your hospital stay. If you have any questions about your discharge medications or the care you received while you were in the hospital after you are discharged, you can call the unit and asked to speak with the hospitalist on call if the hospitalist that took care of you is not available. Once you are discharged, your primary care physician will handle any further medical issues. Please note that NO REFILLS for any discharge medications will be authorized once you are discharged, as it is imperative that you return to your primary care physician (or establish a relationship with a primary care physician if you do not have one) for your aftercare needs so that they can reassess your need for medications and monitor your lab values.     Today   Feels much better.  Afebrile, weakness improved.  Back  to baseline.  Wants to go home.   VITAL SIGNS:  Blood pressure 133/58, pulse 73, temperature 98.9 F (37.2 C), temperature source Oral, resp. rate 16, height 5\' 1"  (1.549 m), weight 52.164 kg (115 lb), SpO2 94 %.  I/O:   Intake/Output Summary (Last 24 hours) at 10/08/15 1637 Last data filed at 10/08/15 1126  Gross per 24 hour  Intake    480 ml  Output      0 ml  Net    480 ml    PHYSICAL EXAMINATION:  GENERAL:  82 y.o.-year-old patient lying in the bed with no acute distress.  EYES: Pupils equal, round, reactive to light and accommodation. No scleral icterus. Extraocular muscles intact.  HEENT: Head atraumatic,  normocephalic. Oropharynx and nasopharynx clear.  NECK:  Supple, no jugular venous distention. No thyroid enlargement, no tenderness.  LUNGS: Normal breath sounds bilaterally, no wheezing, rales,rhonchi. No use of accessory muscles of respiration.  CARDIOVASCULAR: S1, S2 normal. No murmurs, rubs, or gallops.  ABDOMEN: Soft, non-tender, non-distended. Bowel sounds present. No organomegaly or mass.  EXTREMITIES: No pedal edema, cyanosis, or clubbing.  NEUROLOGIC: Cranial nerves II through XII are intact. No focal motor or sensory defecits b/l.  PSYCHIATRIC: The patient is alert and oriented x 3. Good affect.  SKIN: No obvious rash, lesion, or ulcer.   DATA REVIEW:   CBC  Recent Labs Lab 10/08/15 0511  WBC 13.5*  HGB 9.0*  HCT 26.8*  PLT 272    Chemistries   Recent Labs Lab 10/06/15 2210  10/08/15 0511  NA 124*  < > 128*  K 3.1*  < > 3.3*  CL 91*  < > 99*  CO2 22  < > 22  GLUCOSE 128*  < > 102*  BUN 29*  < > 17  CREATININE 1.06*  < > 0.83  CALCIUM 8.9  < > 7.9*  AST 62*  --   --   ALT 69*  --   --   ALKPHOS 102  --   --   BILITOT 1.0  --   --   < > = values in this interval not displayed.  Cardiac Enzymes No results for input(s): TROPONINI in the last 168 hours.  Microbiology Results  Results for orders placed or performed during the hospital encounter of 10/06/15  Culture, blood (Routine X 2) w Reflex to ID Panel     Status: None (Preliminary result)   Collection Time: 10/06/15 10:09 PM  Result Value Ref Range Status   Specimen Description BLOOD RIGHT WRIST  Final   Special Requests BOTTLES DRAWN AEROBIC AND ANAEROBIC [place]  Final   Culture NO GROWTH 2 DAYS  Final   Report Status PENDING  Incomplete  Culture, blood (Routine X 2) w Reflex to ID Panel     Status: None (Preliminary result)   Collection Time: 10/06/15 10:09 PM  Result Value Ref Range Status   Specimen Description BLOOD RIGHT FOREARM  Final   Special Requests BOTTLES DRAWN AEROBIC AND  ANAEROBIC [place]  Final   Culture NO GROWTH 2 DAYS  Final   Report Status PENDING  Incomplete  Urine culture     Status: Abnormal (Preliminary result)   Collection Time: 10/06/15 10:09 PM  Result Value Ref Range Status   Specimen Description URINE, CLEAN CATCH  Final   Special Requests NONE  Final   Culture >=100,000 COLONIES/mL ESCHERICHIA COLI (A)  Final   Report Status PENDING  Incomplete    RADIOLOGY:  Dg Chest  2 View  10/06/2015  CLINICAL DATA:  82 year old female with nausea, vomiting, diarrhea and dysuria for 3 days. EXAM: CHEST  2 VIEW COMPARISON:  10/02/2005 FINDINGS: The cardiomediastinal silhouette is unremarkable. There is no evidence of focal airspace disease, pulmonary edema, suspicious pulmonary nodule/mass, pleural effusion, or pneumothorax. No acute bony abnormalities are identified. IMPRESSION: No active cardiopulmonary disease. Electronically Signed   By: Margarette Canada M.D.   On: 10/06/2015 22:06      Management plans discussed with the patient, family and they are in agreement.  CODE STATUS:     Code Status Orders        Start     Ordered   10/07/15 0130  Full code   Continuous     10/07/15 0129    Code Status History    Date Active Date Inactive Code Status Order ID Comments User Context   This patient has a current code status but no historical code status.      TOTAL TIME TAKING CARE OF THIS PATIENT: 40 minutes.    Henreitta Leber M.D on 10/08/2015 at 4:37 PM  Between 7am to 6pm - Pager - [phone]  After 6pm go to [email or web address] - password [email or web address] [facility]  Office  [phone]  CC: Primary care physician; Einar Pheasant, MD

## 2015-10-09 LAB — URINE CULTURE: Culture: >=100000 — AB

## 2015-10-10 ENCOUNTER — Telehealth: Payer: Self-pay

## 2015-10-10 NOTE — Telephone Encounter (Signed)
Transition Care Management Follow-up Telephone Call   Date discharged? 10/08/15   How have you been since you were released from the hospital? Not resting too well.  Sciatica is bothering me.  I am eating as tolerated and drinking plenty of fluids.  No N/V and very little diarrhea.  I am eating yogurt to help since I am on the antibiotic.     Do you understand why you were in the hospital? YES, I was having nausea/weakness and discovered I have an UTI.   Do you understand the discharge instructions? YES, drink plenty of fluids and be sure to take my potassium.   Where were you discharged to? HOME.   Items Reviewed:  Medications reviewed: YES, STARTED taking potassium chloride 20 MEQ, Bactrim 800-160mg  without issues.  CONTINUING all other medications which have not changed.  Allergies reviewed: YES,Codeine, Flagyl, Skelaxin, Tenormin.  Dietary changes reviewed: YES, cardiac diet, no problems.  Referrals reviewed: YES, Remicade infusions with Dr. Jefm Bryant, Epidural with Dr. Joesphine Bare, and hospital follow up with PCP.   Functional Questionnaire:   Activities of Daily Living (ADLs):   She states they are independent in the following: Independent in all ADLs. States they require assistance with the following: Does not require assistance at this time.     Any transportation issues/concerns?: NO.   Any patient concerns? NO, not at this time.     Confirmed importance and date/time of follow-up visits scheduled YES, appointment scheduled 10/18/15 at 10:00.  Provider Appointment booked with Dr. Nicki Reaper (PCP).  Confirmed with patient if condition begins to worsen call PCP or go to the ER.  Patient was given the office number and encouraged to call back with question or concerns.  : YES, verbalized understanding.

## 2015-10-11 LAB — CULTURE, BLOOD (ROUTINE X 2)
CULTURE: NO GROWTH
Culture: NO GROWTH

## 2015-10-11 NOTE — Progress Notes (Signed)
Received a call from [facility] stating a prescription was received for Acyclovir ointment 5% 15 grams and the smallest size it comes in is 30 grams. Prescription was written by Dr. Verdell Carmine who is not working today. Contacted Dr. Fritzi Mandes who wrote a new prescription for quantity 30 grams. Called [facility] pharmacy back at [phone] reference [phone] and informed them that Dr. Fritzi Mandes wrote a new prescription showing the quantity of 30 grams. Spoke with [place] who stated that a new prescription is not needed only the new quantity was needed and she could take that information over the phone. New quantity of 30 grams given verbally.

## 2015-10-18 ENCOUNTER — Ambulatory Visit (INDEPENDENT_AMBULATORY_CARE_PROVIDER_SITE_OTHER): Payer: Medicare Other | Admitting: Internal Medicine

## 2015-10-18 ENCOUNTER — Encounter: Payer: Self-pay | Admitting: Internal Medicine

## 2015-10-18 ENCOUNTER — Telehealth: Payer: Self-pay | Admitting: *Deleted

## 2015-10-18 VITALS — BP 110/60 | HR 66 | Temp 97.9°F | Resp 18 | Ht 61.0 in | Wt 112.0 lb

## 2015-10-18 DIAGNOSIS — N898 Other specified noninflammatory disorders of vagina: Secondary | ICD-10-CM

## 2015-10-18 DIAGNOSIS — E871 Hypo-osmolality and hyponatremia: Secondary | ICD-10-CM | POA: Diagnosis not present

## 2015-10-18 DIAGNOSIS — D649 Anemia, unspecified: Secondary | ICD-10-CM

## 2015-10-18 DIAGNOSIS — A4151 Sepsis due to Escherichia coli [E. coli]: Secondary | ICD-10-CM | POA: Diagnosis not present

## 2015-10-18 DIAGNOSIS — Z5189 Encounter for other specified aftercare: Secondary | ICD-10-CM | POA: Diagnosis not present

## 2015-10-18 DIAGNOSIS — R7989 Other specified abnormal findings of blood chemistry: Secondary | ICD-10-CM | POA: Diagnosis not present

## 2015-10-18 DIAGNOSIS — L293 Anogenital pruritus, unspecified: Secondary | ICD-10-CM | POA: Diagnosis not present

## 2015-10-18 DIAGNOSIS — R945 Abnormal results of liver function studies: Secondary | ICD-10-CM | POA: Insufficient documentation

## 2015-10-18 DIAGNOSIS — I10 Essential (primary) hypertension: Secondary | ICD-10-CM

## 2015-10-18 DIAGNOSIS — L298 Other pruritus: Secondary | ICD-10-CM | POA: Diagnosis not present

## 2015-10-18 DIAGNOSIS — Z139 Encounter for screening, unspecified: Secondary | ICD-10-CM

## 2015-10-18 DIAGNOSIS — R634 Abnormal weight loss: Secondary | ICD-10-CM

## 2015-10-18 DIAGNOSIS — M1990 Unspecified osteoarthritis, unspecified site: Secondary | ICD-10-CM

## 2015-10-18 DIAGNOSIS — K5791 Diverticulosis of intestine, part unspecified, without perforation or abscess with bleeding: Secondary | ICD-10-CM

## 2015-10-18 LAB — CBC WITH DIFFERENTIAL/PLATELET
BASOS ABS: 0 10*3/uL (ref 0.0–0.1)
Basophils Relative: 0.8 % (ref 0.0–3.0)
EOS ABS: 0.1 10*3/uL (ref 0.0–0.7)
Eosinophils Relative: 2.5 % (ref 0.0–5.0)
HEMATOCRIT: 30.9 % — AB (ref 36.0–46.0)
Hemoglobin: 10.3 g/dL — ABNORMAL LOW (ref 12.0–15.0)
LYMPHS PCT: 36.6 % (ref 12.0–46.0)
Lymphs Abs: 2.1 10*3/uL (ref 0.7–4.0)
MCHC: 33.4 g/dL (ref 30.0–36.0)
MCV: 94.7 fl (ref 78.0–100.0)
MONO ABS: 0.4 10*3/uL (ref 0.1–1.0)
Monocytes Relative: 6.7 % (ref 3.0–12.0)
NEUTROS ABS: 3 10*3/uL (ref 1.4–7.7)
NEUTROS PCT: 53.4 % (ref 43.0–77.0)
PLATELETS: 476 10*3/uL — AB (ref 150.0–400.0)
RBC: 3.26 Mil/uL — ABNORMAL LOW (ref 3.87–5.11)
RDW: 15.8 % — ABNORMAL HIGH (ref 11.5–15.5)
WBC: 5.7 10*3/uL (ref 4.0–10.5)

## 2015-10-18 LAB — POCT URINALYSIS DIPSTICK
Bilirubin, UA: NEGATIVE
GLUCOSE UA: NEGATIVE
KETONES UA: NEGATIVE
Leukocytes, UA: NEGATIVE
Nitrite, UA: NEGATIVE
Protein, UA: NEGATIVE
SPEC GRAV UA: 1.01
UROBILINOGEN UA: 0.2
pH, UA: 7

## 2015-10-18 LAB — BASIC METABOLIC PANEL
BUN: 27 mg/dL — AB (ref 6–23)
CHLORIDE: 96 meq/L (ref 96–112)
CO2: 28 mEq/L (ref 19–32)
Calcium: 9.8 mg/dL (ref 8.4–10.5)
Creatinine, Ser: 1.26 mg/dL — ABNORMAL HIGH (ref 0.40–1.20)
GFR: 43.37 mL/min — AB (ref >60.00)
Glucose, Bld: 95 mg/dL (ref 70–99)
POTASSIUM: 3.4 meq/L — AB (ref 3.5–5.1)
SODIUM: 131 meq/L — AB (ref 135–145)

## 2015-10-18 LAB — HEPATIC FUNCTION PANEL
ALBUMIN: 4.3 g/dL (ref 3.5–5.2)
ALK PHOS: 100 U/L (ref 39–117)
ALT: 52 U/L — AB (ref 0–35)
AST: 27 U/L (ref 0–37)
BILIRUBIN DIRECT: 0.1 mg/dL (ref 0.0–0.3)
BILIRUBIN TOTAL: 0.4 mg/dL (ref 0.2–1.2)
TOTAL PROTEIN: 7.3 g/dL (ref 6.0–8.3)

## 2015-10-18 LAB — IBC PANEL
IRON: 132 ug/dL (ref 42–145)
SATURATION RATIOS: 40 % (ref 20.0–50.0)
TRANSFERRIN: 236 mg/dL (ref 212.0–360.0)

## 2015-10-18 LAB — FERRITIN: FERRITIN: 468.5 ng/mL — AB (ref 10.0–291.0)

## 2015-10-18 MED ORDER — FLUTICASONE PROPIONATE 50 MCG/ACT NA SUSP: NASAL | Status: DC

## 2015-10-18 MED ORDER — OMEPRAZOLE 20 MG PO CPDR: 20.0000 mg | DELAYED_RELEASE_CAPSULE | Freq: Every day | ORAL | Status: DC

## 2015-10-18 MED ORDER — DILTIAZEM HCL ER 240 MG PO CP24: 240.0000 mg | ORAL_CAPSULE | Freq: Every day | ORAL | Status: DC

## 2015-10-18 NOTE — Telephone Encounter (Signed)
Called pt and scheduled her for July 31st @12 ... Also kept the other appt just incase another follow up was needed.Marland Kitchen

## 2015-10-18 NOTE — Telephone Encounter (Signed)
This makes her follow up in 11weeks, are you ok with this? Or sooner?

## 2015-10-18 NOTE — Progress Notes (Signed)
Pre-visit discussion using our clinic review tool. No additional management support is needed unless otherwise documented below in the visit note.  

## 2015-10-18 NOTE — Telephone Encounter (Signed)
Patient was advised to return at check out in six weeks,Dr.Scotts first available was August 30 at 11:30,in which she's scheduled,please advise if this will be an appropriate appt.

## 2015-10-18 NOTE — Progress Notes (Signed)
Patient ID: Stacey Snyder, female   DOB: [birth year], 82 y.o.   MRN: [id number]   Subjective:    Patient ID: Stacey Snyder, female    DOB: [birth year], 82 y.o.   MRN: [id number]  HPI  Patient here for hospital follow up.  Was admitted 10/06/15 with weakness and found to be septic secondary to UTI.  Was treated with fluids and abx.  Was discharged on oral abx.  She was also found to be hyponatremic.  Diuretics held.  She is feeling better.  Eating.  Weight is down.  No vomiting.  Still has issues with diverticulitis.  Takes MOM every 2-3 days to help keep bowels moving.  She is concerned over possible yeast infection.  Some vaginal burning.  She has completed the abx course.  She previously saw Dr Sharlet Salina.  Had lumbar injection.  Helped some.  Still with numbness/tingling - feet.  Still with discomfort left buttock and left leg.     Past Medical History  Diagnosis Date  . Hypertension   . Hypercholesterolemia   . Nephrolithiasis   . Fibrocystic breast disease   . Diverticulosis   . Pancreatitis 2007    s/p ERCP  . IBS (irritable bowel syndrome)   . Inflammatory arthritis ([facility])     positive anti-CCP abs, s/p prednisone, MTX, Remicade  . Osteopenia     GI upset with Fosamax  . GERD (gastroesophageal reflux disease)   . Hypertension    Past Surgical History  Procedure Laterality Date  . Lumbar laminectomy  1983  . Abdominal hysterectomy  1993    ovaries not removed  . Lithotripsy    . Trigger finger release      right ring finger  . Breast biopsy Left 1984    negative  . Breast cyst excision Left 2010    negative   Family History  Problem Relation Age of Onset  . Ovarian cancer Mother   . Renal Disease Mother   . Heart disease Mother   . Diabetes Mother   . Hypertension Mother    Social History   Social History  . Marital Status: Married    Spouse Name: N/A  . Number of Children: 4  . Years of Education: N/A   Social History Main Topics  . Smoking  status: Never Smoker   . Smokeless tobacco: Never Used  . Alcohol Use: No  . Drug Use: No  . Sexual Activity: Not Asked   Other Topics Concern  . None   Social History Narrative    Outpatient Encounter Prescriptions as of 10/18/2015  Medication Sig  . acyclovir ointment (ZOVIRAX) 5 % Apply 1 application topically 4 (four) times daily as needed (fever blister). Apply to lip  . Cholecalciferol (VITAMIN D) 2000 UNITS tablet Take 2,000 Units by mouth daily.  Marland Kitchen diltiazem (DILACOR XR) 240 MG 24 hr capsule Take 1 capsule (240 mg total) by mouth daily.  . fluticasone (FLONASE) 50 MCG/ACT nasal spray USE TWO SPRAY(S) IN EACH NOSTRIL ONCE DAILY  . folic acid (FOLVITE) 1 MG tablet Take 1 mg by mouth 2 (two) times daily.   . InFLIXimab (REMICADE IV) Inject into the vein every 6 (six) weeks. Per Dr Jefm Bryant  . losartan (COZAAR) 100 MG tablet Take 1 tablet (100 mg total) by mouth daily.  Marland Kitchen lovastatin (MEVACOR) 40 MG tablet Take 1 tablet (40 mg total) by mouth daily.  . magnesium oxide (MAG-OX) 400 (241.3 MG) MG tablet Take 400 mg by mouth daily.  Marland Kitchen  methotrexate 25 MG/ML SOLN Inject 17.5 mg into the skin once a week. Patient takes 17.5 mg (0.7 ml) on Monday or Tuesday of each week.  Marland Kitchen omeprazole (PRILOSEC) 20 MG capsule Take 1 capsule (20 mg total) by mouth daily.  Marland Kitchen penciclovir (DENAVIR) 1 % cream Apply 1 application topically every 2 (two) hours. For Fever Blister (Patient taking differently: Apply 1 application topically every 2 (two) hours as needed (fever blister). For Fever Blister)  . potassium chloride 20 MEQ TBCR Take 10 mEq by mouth daily.  Marland Kitchen triamterene-hydrochlorothiazide (MAXZIDE-25) 37.5-25 MG tablet Take 1 tablet by mouth daily.  . [DISCONTINUED] acidophilus (RISAQUAD) CAPS capsule Take 4 capsules by mouth daily.  . [DISCONTINUED] diltiazem (DILACOR XR) 240 MG 24 hr capsule Take 1 capsule (240 mg total) by mouth daily.  . [DISCONTINUED] fluticasone (FLONASE) 50 MCG/ACT nasal spray USE  TWO SPRAY(S) IN EACH NOSTRIL ONCE DAILY  . [DISCONTINUED] omeprazole (PRILOSEC) 20 MG capsule Take 20 mg by mouth daily.  . [DISCONTINUED] sulfamethoxazole-trimethoprim (BACTRIM DS,SEPTRA DS) 800-160 MG tablet Take 1 tablet by mouth 2 (two) times daily.   No facility-administered encounter medications on file as of 10/18/2015.    Review of Systems  Constitutional: Negative for fever.       She is eating now.  Weight loss.    HENT: Negative for congestion and sinus pressure.   Respiratory: Negative for cough, chest tightness and shortness of breath.   Cardiovascular: Negative for chest pain, palpitations and leg swelling.  Gastrointestinal: Negative for vomiting.       Some diverticular flares.  Trying to keep bowels moving.  Takes MOM.   Genitourinary: Negative for vaginal bleeding.       Some vaginal burning.    Musculoskeletal:       Discomfort - left buttock and extending down left leg.   Skin: Negative for color change and rash.  Neurological: Negative for dizziness, light-headedness and headaches.  Psychiatric/Behavioral: Negative for dysphoric mood and agitation.       Objective:    Physical Exam  Constitutional: She appears well-developed and well-nourished. No distress.  HENT:  Nose: Nose normal.  Mouth/Throat: Oropharynx is clear and moist.  Neck: Neck supple.  Cardiovascular: Normal rate and regular rhythm.   Pulmonary/Chest: Breath sounds normal. No respiratory distress. She has no wheezes.  Abdominal: Soft. Bowel sounds are normal.  Minimal discomfort - LLQ.  She reports improved.    Genitourinary:  Normal external genitalia.  Vaginal vault without lesions.  Wet prep sent.  Could not appreciate any adnexal masses or tenderness.    Musculoskeletal: She exhibits no edema or tenderness.  Lymphadenopathy:    She has no cervical adenopathy.  Skin: No rash noted. No erythema.  Psychiatric: She has a normal mood and affect. Her behavior is normal.    BP 110/60 mmHg   Pulse 66  Temp(Src) 97.9 F (36.6 C) (Oral)  Resp 18  Ht 5\' 1"  (1.549 m)  Wt 112 lb (50.803 kg)  BMI 21.17 kg/m2  SpO2 99% Wt Readings from Last 3 Encounters:  10/18/15 112 lb (50.803 kg)  10/06/15 115 lb (52.164 kg)  04/18/15 118 lb 8 oz (53.751 kg)     Lab Results  Component Value Date   WBC 5.7 10/18/2015   HGB 10.3* 10/18/2015   HCT 30.9* 10/18/2015   PLT 476.0* 10/18/2015   GLUCOSE 95 10/18/2015   CHOL 163 04/18/2015   TRIG 166.0* 04/18/2015   HDL 69.70 04/18/2015   LDLCALC 60 04/18/2015  ALT 52* 10/18/2015   AST 27 10/18/2015   NA 131* 10/18/2015   K 3.4* 10/18/2015   CL 96 10/18/2015   CREATININE 1.26* 10/18/2015   BUN 27* 10/18/2015   CO2 28 10/18/2015   TSH 1.05 04/18/2015    Dg Chest 2 View  10/06/2015  CLINICAL DATA:  82 year old female with nausea, vomiting, diarrhea and dysuria for 3 days. EXAM: CHEST  2 VIEW COMPARISON:  10/02/2005 FINDINGS: The cardiomediastinal silhouette is unremarkable. There is no evidence of focal airspace disease, pulmonary edema, suspicious pulmonary nodule/mass, pleural effusion, or pneumothorax. No acute bony abnormalities are identified. IMPRESSION: No active cardiopulmonary disease. Electronically Signed   By: Margarette Canada M.D.   On: 10/06/2015 22:06       Assessment & Plan:   Problem List Items Addressed This Visit    Abnormal liver function tests    Was admitted with sepsis.  Liver function tests elevated.  Recheck liver panel today.       Relevant Orders   Hepatic function panel (Completed)   Anemia    Recheck cbc and iron stores.  Sees GI.       Relevant Orders   CBC with Differential/Platelet (Completed)   Ferritin (Completed)   IBC panel (Completed)   Diverticulosis    Has intermittent flares.  Better now.  Takes MOM to keep bowels moving.  Check cbc.  May need f/u with GI.  Notices some blood occasionally.  Followed by GI.       Hypertension    Blood pressure as outlined.  Recheck metabolic panel.   Recheck sodium and potassium level.        Relevant Medications   diltiazem (DILACOR XR) 240 MG 24 hr capsule   Hyponatremia    Found while in hospital.  Recheck sodium.       Relevant Orders   Basic metabolic panel (Completed)   Inflammatory arthritis ([place])    Would recommend holding her next infusion, until further out from infection.  Check urine today.  Followed by Dr Jefm Bryant.       Loss of weight    Recent admission.  States eating some better now.  Check labs as outlined.  Follow.  Get her back in soon to reassess.        Sepsis ([facility])    Admitted and found to be septic.  Treated with fluids and abx.  Doing better.  Off abx.  Recheck urine today.        Other Visit Diagnoses    Screening    -  Primary    Relevant Orders    POCT urinalysis dipstick (Completed)    Vaginal itching        vaginal irritation.  KOH/wet prep sent.  await treatment.  consider trial of nystatin cream.          Einar Pheasant, MD

## 2015-10-19 ENCOUNTER — Other Ambulatory Visit: Payer: Self-pay | Admitting: Internal Medicine

## 2015-10-19 ENCOUNTER — Other Ambulatory Visit: Payer: Self-pay | Admitting: *Deleted

## 2015-10-19 ENCOUNTER — Telehealth: Payer: Self-pay | Admitting: *Deleted

## 2015-10-19 DIAGNOSIS — R7989 Other specified abnormal findings of blood chemistry: Secondary | ICD-10-CM

## 2015-10-19 DIAGNOSIS — R945 Abnormal results of liver function studies: Secondary | ICD-10-CM

## 2015-10-19 DIAGNOSIS — N390 Urinary tract infection, site not specified: Secondary | ICD-10-CM

## 2015-10-19 NOTE — Telephone Encounter (Signed)
Patient requested her lab results from 10/18/15 Pt contact [phone]

## 2015-10-19 NOTE — Progress Notes (Signed)
Order placed for f/u lab.   

## 2015-10-21 LAB — URINE CULTURE
COLONY COUNT: NO GROWTH
ORGANISM ID, BACTERIA: NO GROWTH

## 2015-10-22 ENCOUNTER — Encounter: Payer: Self-pay | Admitting: Internal Medicine

## 2015-10-22 NOTE — Assessment & Plan Note (Signed)
Found while in hospital.  Recheck sodium.

## 2015-10-22 NOTE — Assessment & Plan Note (Signed)
Would recommend holding her next infusion, until further out from infection.  Check urine today.  Followed by Dr Jefm Bryant.

## 2015-10-22 NOTE — Assessment & Plan Note (Addendum)
Has intermittent flares.  Better now.  Takes MOM to keep bowels moving.  Check cbc.  May need f/u with GI.  Notices some blood occasionally.  Followed by GI.

## 2015-10-22 NOTE — Assessment & Plan Note (Signed)
Recheck cbc and iron stores.  Sees GI.

## 2015-10-22 NOTE — Assessment & Plan Note (Signed)
Was admitted with sepsis.  Liver function tests elevated.  Recheck liver panel today.

## 2015-10-22 NOTE — Telephone Encounter (Signed)
See lab result note, provider spoke with the patient. thanks

## 2015-10-22 NOTE — Assessment & Plan Note (Signed)
Recent admission.  States eating some better now.  Check labs as outlined.  Follow.  Get her back in soon to reassess.

## 2015-10-22 NOTE — Assessment & Plan Note (Signed)
Admitted and found to be septic.  Treated with fluids and abx.  Doing better.  Off abx.  Recheck urine today.

## 2015-10-22 NOTE — Assessment & Plan Note (Signed)
Blood pressure as outlined.  Recheck metabolic panel.  Recheck sodium and potassium level.

## 2015-10-23 ENCOUNTER — Other Ambulatory Visit (INDEPENDENT_AMBULATORY_CARE_PROVIDER_SITE_OTHER): Payer: Medicare Other

## 2015-10-23 DIAGNOSIS — R7989 Other specified abnormal findings of blood chemistry: Secondary | ICD-10-CM | POA: Diagnosis not present

## 2015-10-23 DIAGNOSIS — R945 Abnormal results of liver function studies: Secondary | ICD-10-CM

## 2015-10-23 DIAGNOSIS — R748 Abnormal levels of other serum enzymes: Secondary | ICD-10-CM | POA: Diagnosis not present

## 2015-10-23 LAB — WET PREP BY MOLECULAR PROBE
CANDIDA SPECIES: NEGATIVE
Gardnerella vaginalis: NEGATIVE
Trichomonas vaginosis: NEGATIVE

## 2015-10-23 LAB — HEPATIC FUNCTION PANEL
ALK PHOS: 81 U/L (ref 39–117)
ALT: 22 U/L (ref 0–35)
AST: 17 U/L (ref 0–37)
Albumin: 4 g/dL (ref 3.5–5.2)
BILIRUBIN DIRECT: 0.1 mg/dL (ref 0.0–0.3)
BILIRUBIN TOTAL: 0.4 mg/dL (ref 0.2–1.2)
TOTAL PROTEIN: 6.9 g/dL (ref 6.0–8.3)

## 2015-10-23 LAB — BASIC METABOLIC PANEL
BUN: 26 mg/dL — AB (ref 6–23)
CO2: 31 mEq/L (ref 19–32)
CREATININE: 1.05 mg/dL (ref 0.40–1.20)
Calcium: 9.7 mg/dL (ref 8.4–10.5)
Chloride: 96 mEq/L (ref 96–112)
GFR: 53.52 mL/min — AB (ref >60.00)
GLUCOSE: 93 mg/dL (ref 70–99)
Potassium: 3.5 mEq/L (ref 3.5–5.1)
Sodium: 132 mEq/L — ABNORMAL LOW (ref 135–145)

## 2015-10-24 ENCOUNTER — Other Ambulatory Visit: Payer: Self-pay | Admitting: Internal Medicine

## 2015-10-24 DIAGNOSIS — E871 Hypo-osmolality and hyponatremia: Secondary | ICD-10-CM

## 2015-10-24 NOTE — Progress Notes (Signed)
Order placed for f/u sodium.  ?

## 2015-10-25 DIAGNOSIS — M5136 Other intervertebral disc degeneration, lumbar region: Secondary | ICD-10-CM | POA: Diagnosis not present

## 2015-10-25 DIAGNOSIS — M5416 Radiculopathy, lumbar region: Secondary | ICD-10-CM | POA: Diagnosis not present

## 2015-10-25 DIAGNOSIS — M4806 Spinal stenosis, lumbar region: Secondary | ICD-10-CM | POA: Diagnosis not present

## 2015-10-29 DIAGNOSIS — M0579 Rheumatoid arthritis with rheumatoid factor of multiple sites without organ or systems involvement: Secondary | ICD-10-CM | POA: Diagnosis not present

## 2015-10-29 DIAGNOSIS — M0609 Rheumatoid arthritis without rheumatoid factor, multiple sites: Secondary | ICD-10-CM | POA: Diagnosis not present

## 2015-10-29 DIAGNOSIS — Z79899 Other long term (current) drug therapy: Secondary | ICD-10-CM | POA: Diagnosis not present

## 2015-11-05 ENCOUNTER — Other Ambulatory Visit (INDEPENDENT_AMBULATORY_CARE_PROVIDER_SITE_OTHER): Payer: Medicare Other

## 2015-11-05 DIAGNOSIS — E871 Hypo-osmolality and hyponatremia: Secondary | ICD-10-CM

## 2015-11-05 LAB — SODIUM: Sodium: 132 mEq/L — ABNORMAL LOW (ref 135–145)

## 2015-11-07 ENCOUNTER — Encounter: Payer: Self-pay | Admitting: *Deleted

## 2015-11-19 DIAGNOSIS — G8929 Other chronic pain: Secondary | ICD-10-CM | POA: Diagnosis not present

## 2015-11-19 DIAGNOSIS — M5441 Lumbago with sciatica, right side: Secondary | ICD-10-CM | POA: Diagnosis not present

## 2015-12-03 ENCOUNTER — Encounter: Payer: Self-pay | Admitting: Internal Medicine

## 2015-12-03 ENCOUNTER — Ambulatory Visit: Payer: Medicare Other | Admitting: Internal Medicine

## 2015-12-03 ENCOUNTER — Ambulatory Visit (INDEPENDENT_AMBULATORY_CARE_PROVIDER_SITE_OTHER): Payer: Medicare Other | Admitting: Internal Medicine

## 2015-12-03 ENCOUNTER — Emergency Department: Payer: Medicare Other

## 2015-12-03 ENCOUNTER — Emergency Department
Admission: EM | Admit: 2015-12-03 | Discharge: 2015-12-03 | Disposition: A | Discharge status: Home / Self Care | Payer: Medicare Other | Attending: Emergency Medicine | Admitting: Emergency Medicine

## 2015-12-03 ENCOUNTER — Encounter: Payer: Self-pay | Admitting: Emergency Medicine

## 2015-12-03 DIAGNOSIS — K5790 Diverticulosis of intestine, part unspecified, without perforation or abscess without bleeding: Secondary | ICD-10-CM | POA: Diagnosis not present

## 2015-12-03 DIAGNOSIS — M1990 Unspecified osteoarthritis, unspecified site: Secondary | ICD-10-CM

## 2015-12-03 DIAGNOSIS — R109 Unspecified abdominal pain: Secondary | ICD-10-CM | POA: Diagnosis not present

## 2015-12-03 DIAGNOSIS — R634 Abnormal weight loss: Secondary | ICD-10-CM

## 2015-12-03 DIAGNOSIS — R1032 Left lower quadrant pain: Secondary | ICD-10-CM | POA: Diagnosis present

## 2015-12-03 DIAGNOSIS — Z79899 Other long term (current) drug therapy: Secondary | ICD-10-CM | POA: Insufficient documentation

## 2015-12-03 DIAGNOSIS — I10 Essential (primary) hypertension: Secondary | ICD-10-CM | POA: Insufficient documentation

## 2015-12-03 DIAGNOSIS — K5732 Diverticulitis of large intestine without perforation or abscess without bleeding: Secondary | ICD-10-CM | POA: Diagnosis not present

## 2015-12-03 DIAGNOSIS — K5792 Diverticulitis of intestine, part unspecified, without perforation or abscess without bleeding: Secondary | ICD-10-CM | POA: Insufficient documentation

## 2015-12-03 DIAGNOSIS — E871 Hypo-osmolality and hyponatremia: Secondary | ICD-10-CM

## 2015-12-03 DIAGNOSIS — M25552 Pain in left hip: Secondary | ICD-10-CM

## 2015-12-03 DIAGNOSIS — E7800 Pure hypercholesterolemia, unspecified: Secondary | ICD-10-CM

## 2015-12-03 DIAGNOSIS — R7989 Other specified abnormal findings of blood chemistry: Secondary | ICD-10-CM

## 2015-12-03 DIAGNOSIS — R945 Abnormal results of liver function studies: Secondary | ICD-10-CM

## 2015-12-03 LAB — COMPREHENSIVE METABOLIC PANEL
ALBUMIN: 3.3 g/dL — AB (ref 3.5–5.0)
ALT: 35 U/L (ref 14–54)
AST: 30 U/L (ref 15–41)
Alkaline Phosphatase: 156 U/L — ABNORMAL HIGH (ref 38–126)
Anion gap: 8 (ref 5–15)
BILIRUBIN TOTAL: 0.5 mg/dL (ref 0.3–1.2)
BUN: 25 mg/dL — AB (ref 6–20)
CHLORIDE: 92 mmol/L — AB (ref 101–111)
CO2: 25 mmol/L (ref 22–32)
CREATININE: 1.04 mg/dL — AB (ref 0.44–1.00)
Calcium: 8.9 mg/dL (ref 8.9–10.3)
GFR calc Af Amer: 57 mL/min — ABNORMAL LOW (ref >60)
GFR, EST NON AFRICAN AMERICAN: 49 mL/min — AB (ref >60)
GLUCOSE: 146 mg/dL — AB (ref 65–99)
Potassium: 3.4 mmol/L — ABNORMAL LOW (ref 3.5–5.1)
Sodium: 125 mmol/L — ABNORMAL LOW (ref 135–145)
TOTAL PROTEIN: 7.1 g/dL (ref 6.5–8.1)

## 2015-12-03 LAB — CBC
HCT: 34 % — ABNORMAL LOW (ref 35.0–47.0)
Hemoglobin: 11.6 g/dL — ABNORMAL LOW (ref 12.0–16.0)
MCH: 32.8 pg (ref 26.0–34.0)
MCHC: 34.1 g/dL (ref 32.0–36.0)
MCV: 96.2 fL (ref 80.0–100.0)
PLATELETS: 356 10*3/uL (ref 150–440)
RBC: 3.54 MIL/uL — ABNORMAL LOW (ref 3.80–5.20)
RDW: 14.6 % — AB (ref 11.5–14.5)
WBC: 15.3 10*3/uL — AB (ref 3.6–11.0)

## 2015-12-03 LAB — URINALYSIS COMPLETE WITH MICROSCOPIC (ARMC ONLY)
BILIRUBIN URINE: NEGATIVE
GLUCOSE, UA: NEGATIVE mg/dL
KETONES UR: NEGATIVE mg/dL
LEUKOCYTES UA: NEGATIVE
Nitrite: NEGATIVE
PH: 8 (ref 5.0–8.0)
Protein, ur: NEGATIVE mg/dL
Specific Gravity, Urine: 1.008 (ref 1.005–1.030)

## 2015-12-03 LAB — LIPASE, BLOOD: Lipase: 26 U/L (ref 11–51)

## 2015-12-03 MED ORDER — CIPROFLOXACIN HCL 500 MG PO TABS: 500.0000 mg | ORAL_TABLET | Freq: Two times a day (BID) | ORAL | 0 refills | Status: AC

## 2015-12-03 MED ORDER — SODIUM CHLORIDE 0.9 % IV BOLUS (SEPSIS)
500.0000 mL | INTRAVENOUS | Status: AC
  Administered 2015-12-03: 500 mL via INTRAVENOUS

## 2015-12-03 MED ORDER — ONDANSETRON 4 MG PO TBDP
ORAL_TABLET | ORAL | 0 refills | Status: DC
Start: 2015-12-03 — End: 2016-08-05

## 2015-12-03 MED ORDER — TRAMADOL HCL 50 MG PO TABS
50.0000 mg | ORAL_TABLET | Freq: Once | ORAL | Status: AC
  Administered 2015-12-03: 50 mg via ORAL
  Filled 2015-12-03: qty 1

## 2015-12-03 MED ORDER — DIATRIZOATE MEGLUMINE & SODIUM 66-10 % PO SOLN
15.0000 mL | ORAL | Status: AC
  Administered 2015-12-03: 15 mL via ORAL

## 2015-12-03 MED ORDER — DOCUSATE SODIUM 100 MG PO CAPS: ORAL_CAPSULE | ORAL | 0 refills | Status: DC

## 2015-12-03 MED ORDER — TRAMADOL HCL 50 MG PO TABS: 100.0000 mg | ORAL_TABLET | Freq: Once | ORAL | Status: DC

## 2015-12-03 MED ORDER — METRONIDAZOLE 500 MG PO TABS: 500.0000 mg | ORAL_TABLET | Freq: Three times a day (TID) | ORAL | 0 refills | Status: AC

## 2015-12-03 MED ORDER — IOPAMIDOL (ISOVUE-300) INJECTION 61%
75.0000 mL | Freq: Once | INTRAVENOUS | Status: AC | PRN
  Administered 2015-12-03: 75 mL via INTRAVENOUS

## 2015-12-03 MED ORDER — TRAMADOL HCL 50 MG PO TABS: ORAL_TABLET | ORAL | 0 refills | Status: DC

## 2015-12-03 MED ORDER — PIPERACILLIN-TAZOBACTAM 3.375 G IVPB 30 MIN
3.3750 g | Freq: Once | INTRAVENOUS | Status: AC
  Administered 2015-12-03: 3.375 g via INTRAVENOUS
  Filled 2015-12-03: qty 50

## 2015-12-03 MED ORDER — FLUCONAZOLE 150 MG PO TABS: ORAL_TABLET | ORAL | 0 refills | Status: DC

## 2015-12-03 NOTE — Assessment & Plan Note (Signed)
Persistent.  Decreased appetite.

## 2015-12-03 NOTE — ED Triage Notes (Signed)
Pt presents to ED with LLQ abdominal pain for approximately four days. Pt states has a history of diverticulosis and diverticulitis. Pt saw her PCP today who sent her here for evaluation of diverticular flare.

## 2015-12-03 NOTE — ED Provider Notes (Signed)
[facility] Emergency Department Provider Note  ____________________________________________   First MD Initiated Contact with Patient 12/03/15 1553     (approximate)  I have reviewed the triage vital signs and the nursing notes.   HISTORY  Chief Complaint Diverticulitis    HPI Stacey Snyder is a 82 y.o. female with a history of prior diverticulitis resulting in sepsis but he was apparently not required: Surgery.  She presents for evaluation of possible diverticular disease.  She had gradual onset of primarily left lower quadrant abdominal pain that started 5-6 days ago and has steadily gotten worse.  She now has severe pain with movement and ambulation although at rest she reports the pain is mild and tolerable without any pain medicines.  She denies nausea and vomiting.  She also denies fever/chills, chest pain, shortness of breath, dysuria, hematuria.He states it does feel like her prior diverticular pain and is primarily sharp although also has an aching quality.  She saw her primary care doctor, Dr. Einar Pheasant, who recommended she come to the emergency department.   Past Medical History:  Diagnosis Date  . Diverticulosis   . Fibrocystic breast disease   . GERD (gastroesophageal reflux disease)   . Hypercholesterolemia   . Hypertension   . Hypertension   . IBS (irritable bowel syndrome)   . Inflammatory arthritis (HCC)    positive anti-CCP abs, s/p prednisone, MTX, Remicade  . Nephrolithiasis   . Osteopenia    GI upset with Fosamax  . Pancreatitis 2007   s/p ERCP    Patient Active Problem List   Diagnosis Date Noted  . Abdominal pain 12/03/2015  . Abnormal liver function tests 10/18/2015  . Anemia 10/18/2015  . Sepsis ([place]) 10/07/2015  . Hyponatremia 10/07/2015  . Lip lesion 04/23/2015  . Scalp lesion 04/23/2015  . Loss of weight 04/23/2015  . Stress 04/23/2015  . Left shoulder pain 12/20/2014  . UTI (urinary tract  infection) 08/20/2014  . Health care maintenance 08/20/2014  . Skin lesion 04/17/2014  . Fatigue 04/13/2014  . Shoulder pain, right 12/17/2013  . Trigger finger 08/21/2013  . Left hip pain 04/17/2013  . GERD (gastroesophageal reflux disease) 04/03/2012  . Inflammatory arthritis ([place]) 03/30/2012  . Diverticulosis 03/30/2012  . Osteopenia 03/30/2012  . Hypertension 03/30/2012  . Hypercholesterolemia 03/30/2012    Past Surgical History:  Procedure Laterality Date  . ABDOMINAL HYSTERECTOMY  1993   ovaries not removed  . BREAST BIOPSY Left 1984   negative  . BREAST CYST EXCISION Left 2010   negative  . LITHOTRIPSY    . LUMBAR LAMINECTOMY  1983  . TRIGGER FINGER RELEASE     right ring finger    Prior to Admission medications   Medication Sig Start Date End Date Taking? Authorizing Provider  acyclovir ointment (ZOVIRAX) 5 % Apply 1 application topically 4 (four) times daily as needed (fever blister). Apply to lip 10/08/15   Henreitta Leber, MD  Cholecalciferol (VITAMIN D) 2000 UNITS tablet Take 2,000 Units by mouth daily.    Historical Provider, MD  ciprofloxacin (CIPRO) 500 MG tablet Take 1 tablet (500 mg total) by mouth 2 (two) times daily. 12/03/15 12/13/15  Hinda Kehr, MD  diltiazem (DILACOR XR) 240 MG 24 hr capsule Take 1 capsule (240 mg total) by mouth daily. 10/18/15   Einar Pheasant, MD  docusate sodium (COLACE) 100 MG capsule Take 1 tablet once or twice daily as needed for constipation while taking narcotic pain medicine 12/03/15   Tommi Rumps  Karma Greaser, MD  fluconazole (DIFLUCAN) 150 MG tablet Take 1 tablet by mouth if you develop a yeast infection (including vaginal or thrush).  If you still have symptoms after 72 hours, take another tablet. 12/03/15   Hinda Kehr, MD  fluticasone (FLONASE) 50 MCG/ACT nasal spray USE TWO SPRAY(S) IN EACH NOSTRIL ONCE DAILY 10/18/15   Einar Pheasant, MD  folic acid (FOLVITE) 1 MG tablet Take 1 mg by mouth 2 (two) times daily.     Historical Provider, MD    InFLIXimab (REMICADE IV) Inject into the vein every 6 (six) weeks. Per Dr Jefm Bryant    Historical Provider, MD  losartan (COZAAR) 100 MG tablet Take 1 tablet (100 mg total) by mouth daily. 06/07/15   Einar Pheasant, MD  lovastatin (MEVACOR) 40 MG tablet Take 1 tablet (40 mg total) by mouth daily. 06/07/15   Einar Pheasant, MD  magnesium oxide (MAG-OX) 400 (241.3 MG) MG tablet Take 400 mg by mouth daily.    Historical Provider, MD  methotrexate 25 MG/ML SOLN Inject 17.5 mg into the skin once a week. Patient takes 17.5 mg (0.7 ml) on Monday or Tuesday of each week.    Historical Provider, MD  metroNIDAZOLE (FLAGYL) 500 MG tablet Take 1 tablet (500 mg total) by mouth 3 (three) times daily. 12/03/15 12/13/15  Hinda Kehr, MD  omeprazole (PRILOSEC) 20 MG capsule Take 1 capsule (20 mg total) by mouth daily. 10/18/15   Einar Pheasant, MD  ondansetron (ZOFRAN ODT) 4 MG disintegrating tablet Allow 1-2 tablets to dissolve in your mouth every 8 hours as needed for nausea/vomiting 12/03/15   Hinda Kehr, MD  penciclovir [facility]) 1 % cream Apply 1 application topically every 2 (two) hours. For Fever Blister Patient taking differently: Apply 1 application topically every 2 (two) hours as needed (fever blister). For Fever Blister 04/09/15   Einar Pheasant, MD  potassium chloride 20 MEQ TBCR Take 10 mEq by mouth daily. 10/08/15   Henreitta Leber, MD  traMADol (ULTRAM) 50 MG tablet Take 1-2 tablets by mouth every 6 hours as needed for moderate to severe pain 12/03/15   Hinda Kehr, MD  triamterene-hydrochlorothiazide (MAXZIDE-25) 37.5-25 MG tablet Take 1 tablet by mouth daily. 06/07/15   Einar Pheasant, MD    Allergies Codeine; Flagyl [metronidazole]; Skelaxin [metaxalone]; and Tenormin [atenolol]  Family History  Problem Relation Age of Onset  . Ovarian cancer Mother   . Renal Disease Mother   . Heart disease Mother   . Diabetes Mother   . Hypertension Mother     Social History Social History  Substance Use  Topics  . Smoking status: Never Smoker  . Smokeless tobacco: Never Used  . Alcohol use No    Review of Systems Constitutional: No fever/chills Eyes: No visual changes. ENT: No sore throat. Cardiovascular: Denies chest pain. Respiratory: Denies shortness of breath. Gastrointestinal: +abdominal pain (LLQ).  No nausea, no vomiting.  No diarrhea.  No constipation. Genitourinary: Negative for dysuria. Musculoskeletal: Negative for back pain. Skin: Negative for rash. Neurological: Negative for headaches, focal weakness or numbness.  10-point ROS otherwise negative.  ____________________________________________   PHYSICAL EXAM:  VITAL SIGNS: ED Triage Vitals  Enc Vitals Group     BP 12/03/15 1302 (!) 142/44     Pulse Rate 12/03/15 1302 80     Resp 12/03/15 1302 18     Temp 12/03/15 1302 98.2 F (36.8 C)     Temp Source 12/03/15 1302 Oral     SpO2 12/03/15 1302 100 %  Weight 12/03/15 1302 111 lb (50.3 kg)     Height --      Head Circumference --      Peak Flow --      Pain Score 12/03/15 1303 8     Pain Loc --      Pain Edu? --      Excl. in [place]? --     Constitutional: Alert and oriented. Elderly but well appearing and in no acute distress. Eyes: Conjunctivae are normal. PERRL. EOMI. Head: Atraumatic. Nose: No congestion/rhinnorhea. Mouth/Throat: Mucous membranes are moist.  Oropharynx non-erythematous. Neck: No stridor.  No meningeal signs.   Cardiovascular: Normal rate, regular rhythm. Good peripheral circulation. Grossly normal heart sounds.   Respiratory: Normal respiratory effort.  No retractions. Lungs CTAB. Gastrointestinal: Soft With moderate tenderness to palpation of the left lower quadrant as well as mild tenderness to palpation of the right lower quadrant.  No rebound, no guarding, no evidence of acute abdomen/peritonitis at this time Musculoskeletal: No lower extremity tenderness nor edema. No gross deformities of extremities. Neurologic:  Normal speech  and language. No gross focal neurologic deficits are appreciated.  Skin:  Skin is warm, dry and intact. No rash noted. Psychiatric: Mood and affect are normal. Speech and behavior are normal.  ____________________________________________   LABS (all labs ordered are listed, but only abnormal results are displayed)  Labs Reviewed  COMPREHENSIVE METABOLIC PANEL - Abnormal; Notable for the following:       Result Value   Sodium 125 (*)    Potassium 3.4 (*)    Chloride 92 (*)    Glucose, Bld 146 (*)    BUN 25 (*)    Creatinine, Ser 1.04 (*)    Albumin 3.3 (*)    Alkaline Phosphatase 156 (*)    GFR calc non Af Amer 49 (*)    GFR calc Af Amer 57 (*)    All other components within normal limits  CBC - Abnormal; Notable for the following:    WBC 15.3 (*)    RBC 3.54 (*)    Hemoglobin 11.6 (*)    HCT 34.0 (*)    RDW 14.6 (*)    All other components within normal limits  URINALYSIS COMPLETEWITH MICROSCOPIC ([facility] ONLY) - Abnormal; Notable for the following:    Color, Urine YELLOW (*)    APPearance CLEAR (*)    Hgb urine dipstick 1+ (*)    Bacteria, UA RARE (*)    Squamous Epithelial / LPF 0-5 (*)    All other components within normal limits  LIPASE, BLOOD   ____________________________________________  EKG  None ____________________________________________  RADIOLOGY   Ct Abdomen Pelvis W Contrast  Result Date: 12/03/2015 CLINICAL DATA:  Left lower quadrant pain for 4 days. Elevated white blood cell count. EXAM: CT ABDOMEN AND PELVIS WITH CONTRAST TECHNIQUE: Multidetector CT imaging of the abdomen and pelvis was performed using the standard protocol following bolus administration of intravenous contrast. CONTRAST:  75 ml ISOVUE-300 IOPAMIDOL (ISOVUE-300) INJECTION 61% COMPARISON:  CT abdomen and pelvis 01/11/2010 and 04/22/2008. FINDINGS: The lung bases are clear.  No pleural or pericardial effusion. The gallbladder, liver, spleen, left kidney and adrenal glands appear  normal. Two small low attenuating lesions in the right kidney cannot be definitively characterized but are likely cysts. Multiple low attenuating lesions are identified in the pancreas. Some of these were present on the prior study. The largest measures 2.2 by 2.4 cm at the junction of the body and neck and is increased in  size from 1 cm in diameter on the most recent prior exam. Colonic diverticulosis is worst in the sigmoid. Marked wall thickening and stranding about the distal sigmoid consistent with acute diverticulitis is seen. No abscess or perforation is identified. There is no pneumatosis or portal venous gas. The stomach and small bowel appear normal. No lymphadenopathy. No lytic or sclerotic bony lesion. The patient is status post right L3-4 laminotomy. IMPRESSION: The study is positive for acute sigmoid diverticulitis without abscess or perforation. Multiple low attenuating lesions in the pancreas. Nonemergent MRI/MRCP is recommended for further evaluation. Electronically Signed   By: Inge Rise M.D.   On: 12/03/2015 18:52    ____________________________________________   PROCEDURES  Procedure(s) performed:   Procedures   ____________________________________________   INITIAL IMPRESSION / ASSESSMENT AND PLAN / ED COURSE  Pertinent labs & imaging results that were available during my care of the patient were reviewed by me and considered in my medical decision making (see chart for details).  The patient has been waiting 4 hours before able to be placed in an exam room.  Her labs are notable for a significant leukocytosis as well as some hyponatremia.  This is likely volume related given that she has not been eating or drinking as well as usual over the last couple of days.  I will give her a small fluid bolus, empiric Zosyn 3.375 g IV, and will obtain a CT scan of her abdomen and pelvis to further evaluate what is most likely diverticulitis.  She and her family agree with this  plan.  Clinical Course  Value Comment By Time  CT ABDOMEN PELVIS W CONTRAST Acute sigmoid diverticulitis without evidence of abscess or perforation.  I will reassess the patient and discussed with her and her family their preference; she may be appropriate for outpatient management but given her age, leukocytosis, her level of pain, I may contact the surgeon for evaluation. Hinda Kehr, MD 07/31 1912   I discussed everything with the patient and her daughter.  I expressed my concerns and the reasons that possibly she should stay in the hospital, but she feels well and would much prefer to go home.  I gave her and her daughter strict return precautions as well as prescriptions for antibiotics, pain control, and nausea control.  She understands and will return if needed.  I also provided information about the nonspecific pancreatic lesions seen on the CT scan and recommended outpatient follow-up with an MRI. Hinda Kehr, MD 07/31 1934    ____________________________________________  FINAL CLINICAL IMPRESSION(S) / ED DIAGNOSES  Final diagnoses:  Acute diverticulitis  Hyponatremia     MEDICATIONS GIVEN DURING THIS VISIT:  Medications  diatrizoate meglumine-sodium (GASTROGRAFIN) 66-10 % solution 15 mL (15 mLs Oral Given 12/03/15 1616)  piperacillin-tazobactam (ZOSYN) IVPB 3.375 g (0 g Intravenous Stopped 12/03/15 1634)  sodium chloride 0.9 % bolus 500 mL (0 mLs Intravenous Stopped 12/03/15 1808)  iopamidol (ISOVUE-300) 61 % injection 75 mL (75 mLs Intravenous Contrast Given 12/03/15 1817)  traMADol (ULTRAM) tablet 50 mg (50 mg Oral Given 12/03/15 1925)     NEW OUTPATIENT MEDICATIONS STARTED DURING THIS VISIT:  New Prescriptions   CIPROFLOXACIN (CIPRO) 500 MG TABLET    Take 1 tablet (500 mg total) by mouth 2 (two) times daily.   DOCUSATE SODIUM (COLACE) 100 MG CAPSULE    Take 1 tablet once or twice daily as needed for constipation while taking narcotic pain medicine   FLUCONAZOLE (DIFLUCAN)  150 MG TABLET  Take 1 tablet by mouth if you develop a yeast infection (including vaginal or thrush).  If you still have symptoms after 72 hours, take another tablet.   METRONIDAZOLE (FLAGYL) 500 MG TABLET    Take 1 tablet (500 mg total) by mouth 3 (three) times daily.   ONDANSETRON (ZOFRAN ODT) 4 MG DISINTEGRATING TABLET    Allow 1-2 tablets to dissolve in your mouth every 8 hours as needed for nausea/vomiting   TRAMADOL (ULTRAM) 50 MG TABLET    Take 1-2 tablets by mouth every 6 hours as needed for moderate to severe pain      Note:  This document was prepared using Dragon voice recognition software and may include unintentional dictation errors.    Hinda Kehr, MD 12/03/15 [phone]

## 2015-12-03 NOTE — ED Notes (Signed)
Patient transported to CT 

## 2015-12-03 NOTE — Assessment & Plan Note (Signed)
On remicade.  Will need to hold infusion until clear.

## 2015-12-03 NOTE — Progress Notes (Signed)
Patient ID: Stacey Snyder, female   DOB: 12/20/1932, 82 y.o.   MRN: [id number]   Subjective:    Patient ID: Stacey Snyder, female    DOB: [birth year], 82 y.o.   MRN: [id number]  HPI  Patient here for a scheduled follow up.  She reports increased abdominal pain.  "worse flare" she has had.  Started last week.  Really increased last night.  Bowels had slowed some and she took MOM last night.  Now with increased loose stool.  No vomiting, but does report decreased po intake.  Weight is down.  No blood in her stool.  Increased pain with movement - walking, standing, sitting.  Unable to move without increased pain. No sob.   Right hip injection helped.     Past Medical History:  Diagnosis Date  . Diverticulosis   . Fibrocystic breast disease   . GERD (gastroesophageal reflux disease)   . Hypercholesterolemia   . Hypertension   . Hypertension   . IBS (irritable bowel syndrome)   . Inflammatory arthritis (HCC)    positive anti-CCP abs, s/p prednisone, MTX, Remicade  . Nephrolithiasis   . Osteopenia    GI upset with Fosamax  . Pancreatitis 2007   s/p ERCP   Past Surgical History:  Procedure Laterality Date  . ABDOMINAL HYSTERECTOMY  1993   ovaries not removed  . BREAST BIOPSY Left 1984   negative  . BREAST CYST EXCISION Left 2010   negative  . LITHOTRIPSY    . LUMBAR LAMINECTOMY  1983  . TRIGGER FINGER RELEASE     right ring finger   Family History  Problem Relation Age of Onset  . Ovarian cancer Mother   . Renal Disease Mother   . Heart disease Mother   . Diabetes Mother   . Hypertension Mother    Social History   Social History  . Marital status: Married    Spouse name: N/A  . Number of children: 4  . Years of education: N/A   Social History Main Topics  . Smoking status: Never Smoker  . Smokeless tobacco: Never Used  . Alcohol use No  . Drug use: No  . Sexual activity: Not Asked   Other Topics Concern  . None   Social History Narrative  .  None    Outpatient Encounter Prescriptions as of 12/03/2015  Medication Sig  . acyclovir ointment (ZOVIRAX) 5 % Apply 1 application topically 4 (four) times daily as needed (fever blister). Apply to lip  . Cholecalciferol (VITAMIN D) 2000 UNITS tablet Take 2,000 Units by mouth daily.  Marland Kitchen diltiazem (DILACOR XR) 240 MG 24 hr capsule Take 1 capsule (240 mg total) by mouth daily.  . fluticasone (FLONASE) 50 MCG/ACT nasal spray USE TWO SPRAY(S) IN EACH NOSTRIL ONCE DAILY  . folic acid (FOLVITE) 1 MG tablet Take 1 mg by mouth 2 (two) times daily.   . InFLIXimab (REMICADE IV) Inject into the vein every 6 (six) weeks. Per Dr Jefm Bryant  . losartan (COZAAR) 100 MG tablet Take 1 tablet (100 mg total) by mouth daily.  Marland Kitchen lovastatin (MEVACOR) 40 MG tablet Take 1 tablet (40 mg total) by mouth daily.  . magnesium oxide (MAG-OX) 400 (241.3 MG) MG tablet Take 400 mg by mouth daily.  . methotrexate 25 MG/ML SOLN Inject 17.5 mg into the skin once a week. Patient takes 17.5 mg (0.7 ml) on Monday or Tuesday of each week.  Marland Kitchen omeprazole (PRILOSEC) 20 MG capsule Take 1 capsule (20 mg  total) by mouth daily.  Marland Kitchen penciclovir (DENAVIR) 1 % cream Apply 1 application topically every 2 (two) hours. For Fever Blister (Patient taking differently: Apply 1 application topically every 2 (two) hours as needed (fever blister). For Fever Blister)  . potassium chloride 20 MEQ TBCR Take 10 mEq by mouth daily.  Marland Kitchen triamterene-hydrochlorothiazide (MAXZIDE-25) 37.5-25 MG tablet Take 1 tablet by mouth daily.   No facility-administered encounter medications on file as of 12/03/2015.     Review of Systems  Constitutional: Positive for appetite change.       Continued weight loss.  Decreased appetite.    HENT: Negative for congestion and sinus pressure.   Respiratory: Negative for cough, chest tightness and shortness of breath.   Cardiovascular: Negative for chest pain, palpitations and leg swelling.  Gastrointestinal: Positive for abdominal  pain. Negative for nausea and vomiting.       Loose stool.    Genitourinary: Negative for difficulty urinating and dysuria.  Musculoskeletal: Negative for joint swelling and myalgias.  Skin: Negative for color change and rash.  Neurological: Negative for dizziness, light-headedness and headaches.  Psychiatric/Behavioral: Negative for agitation and dysphoric mood.       Objective:    Physical Exam  Constitutional: She appears well-developed and well-nourished.  HENT:  Nose: Nose normal.  Mouth/Throat: Oropharynx is clear and moist.  Mucus membranes dry - oropharynx.   Neck: Neck supple. No thyromegaly present.  Cardiovascular: Normal rate and regular rhythm.   Pulmonary/Chest: Breath sounds normal. No respiratory distress. She has no wheezes.  Abdominal: Soft. Bowel sounds are normal. There is tenderness.  Increased tenderness to palpation.    Musculoskeletal: She exhibits no edema or tenderness.  Lymphadenopathy:    She has no cervical adenopathy.  Skin: No rash noted. No erythema.  Psychiatric: She has a normal mood and affect. Her behavior is normal.    BP 122/60 (BP Location: Left Arm, Patient Position: Sitting, Cuff Size: Normal)   Pulse 100   Temp 98.5 F (36.9 C) (Oral)   Resp 17   Ht 5\' 1"  (1.549 m)   Wt 111 lb 8 oz (50.6 kg)   SpO2 97%   BMI 21.07 kg/m  Wt Readings from Last 3 Encounters:  12/03/15 111 lb (50.3 kg)  12/03/15 111 lb 8 oz (50.6 kg)  10/18/15 112 lb (50.8 kg)     Lab Results  Component Value Date   WBC 15.3 (H) 12/03/2015   HGB 11.6 (L) 12/03/2015   HCT 34.0 (L) 12/03/2015   PLT 356 12/03/2015   GLUCOSE 146 (H) 12/03/2015   CHOL 163 04/18/2015   TRIG 166.0 (H) 04/18/2015   HDL 69.70 04/18/2015   LDLCALC 60 04/18/2015   ALT 35 12/03/2015   AST 30 12/03/2015   NA 125 (L) 12/03/2015   K 3.4 (L) 12/03/2015   CL 92 (L) 12/03/2015   CREATININE 1.04 (H) 12/03/2015   BUN 25 (H) 12/03/2015   CO2 25 12/03/2015   TSH 1.05 04/18/2015     Dg Chest 2 View  Result Date: 10/06/2015 CLINICAL DATA:  82 year old female with nausea, vomiting, diarrhea and dysuria for 3 days. EXAM: CHEST  2 VIEW COMPARISON:  10/02/2005 FINDINGS: The cardiomediastinal silhouette is unremarkable. There is no evidence of focal airspace disease, pulmonary edema, suspicious pulmonary nodule/mass, pleural effusion, or pneumothorax. No acute bony abnormalities are identified. IMPRESSION: No active cardiopulmonary disease. Electronically Signed   By: Margarette Canada M.D.   On: 10/06/2015 22:06       Assessment &  Plan:   Problem List Items Addressed This Visit    Abdominal pain    Increased abdominal pain.  Increased tenderness to palpation over the lower abdomen.  May all be diverticular flare.  Needs labs and scan.  Increased pain with minimal movement.  R/o abscess, etc.  Discussed with pt and daughter.  Agree with ER evaluation.  To ER for further testing and treatment.        Abnormal liver function tests    Recently admitted with sepsis.  Will need f/u liver panel with current symptoms.        Hypercholesterolemia    On lovastatin.  Follow lipid panel and liver function tests.        Hypertension    Blood pressure has been under good control.  Continue same medication regimen.  Follow pressures.  Follow metabolic panel.        Hyponatremia    With history of decreased sodium after being admitted with sepsis.  Stable on last check.  Now with decreased po intake.  Needs electrolytes checked.        Inflammatory arthritis ([place])    On remicade.  Will need to hold infusion until clear.        Left hip pain    S/p injection and doing better.  Follow.       Loss of weight    Persistent.  Decreased appetite.         Other Visit Diagnoses   None.    I spent 40 minutes with the patient and more than 50% of the time was spent in consultation regarding the above.     Einar Pheasant, MD

## 2015-12-03 NOTE — Assessment & Plan Note (Signed)
Blood pressure has been under good control.  Continue same medication regimen.  Follow pressures.  Follow metabolic panel.   

## 2015-12-03 NOTE — Assessment & Plan Note (Signed)
Increased abdominal pain.  Increased tenderness to palpation over the lower abdomen.  May all be diverticular flare.  Needs labs and scan.  Increased pain with minimal movement.  R/o abscess, etc.  Discussed with pt and daughter.  Agree with ER evaluation.  To ER for further testing and treatment.

## 2015-12-03 NOTE — Assessment & Plan Note (Signed)
On lovastatin.  Follow lipid panel and liver function tests.   

## 2015-12-03 NOTE — Discharge Instructions (Signed)
We believe your symptoms are caused by diverticulitis.  We discussed possibly admitting her to the hospital, but most of the time this condition (please read through the included information) can be cured with outpatient antibiotics, and you prefer to go home as well which we believe is reasonable.  Please take the full course of prescribed medication(s) and follow up with the doctors recommended above.  Make sure that you drink plenty of clear fluids and try to stay hydrated even if you are eating a small amount and only eating bland foods.  The only other notable finding on your imaging today was that you have multiple nonspecific lesions on your pancreas.  This may mean nothing, but the radiologist recommended that you discuss it with your primary care doctor, who can schedule an outpatient MRI of your abdomen if he/she and you feel that it is necessary.  Return to the ED if your abdominal pain worsens or fails to improve, you develop bloody vomiting, bloody diarrhea, you are unable to tolerate fluids due to vomiting, fever greater than 101, or other symptoms that concern you.

## 2015-12-03 NOTE — Progress Notes (Signed)
Pre-visit discussion using our clinic review tool. No additional management support is needed unless otherwise documented below in the visit note.  

## 2015-12-03 NOTE — Assessment & Plan Note (Signed)
With history of decreased sodium after being admitted with sepsis.  Stable on last check.  Now with decreased po intake.  Needs electrolytes checked.

## 2015-12-03 NOTE — Assessment & Plan Note (Signed)
S/p injection and doing better.  Follow.  

## 2015-12-03 NOTE — Assessment & Plan Note (Signed)
Recently admitted with sepsis.  Will need f/u liver panel with current symptoms.

## 2015-12-17 ENCOUNTER — Other Ambulatory Visit: Payer: Self-pay | Admitting: Internal Medicine

## 2015-12-17 ENCOUNTER — Telehealth: Payer: Self-pay | Admitting: Internal Medicine

## 2015-12-17 DIAGNOSIS — E871 Hypo-osmolality and hyponatremia: Secondary | ICD-10-CM

## 2015-12-17 DIAGNOSIS — D72829 Elevated white blood cell count, unspecified: Secondary | ICD-10-CM

## 2015-12-17 MED ORDER — NYSTATIN 100000 UNIT/ML MT SUSP: OROMUCOSAL | 0 refills | Status: DC

## 2015-12-17 NOTE — Telephone Encounter (Signed)
She thought it had cleared up, tongue is still white, eating; yogurt, lean ham, light foods, drinking boost daily to increase her appetite.    She will come back tomorrow at around Eleven o'clock to have the labs done.

## 2015-12-17 NOTE — Telephone Encounter (Signed)
Patient was given Diflucan 1 tablet for possible thrush if needed. Please advise refill? Thanks

## 2015-12-17 NOTE — Telephone Encounter (Signed)
The patient was seen in the ER on 7.31.17 for diverticulitis she was given medication for diverticulitis and thrush , but the thrush has not cleared up. She is asking for more medication for thrush.

## 2015-12-17 NOTE — Telephone Encounter (Signed)
Notify pt that I sent in a prescription for nystatin suspension - she will rinse and spit tid.  This should help with thrush.  Let me know if persistent problems.

## 2015-12-17 NOTE — Telephone Encounter (Signed)
Need more info.  Please find out how pt is doing.  I have not see her since she has been in the ER.  Is she still having a lot of trouble with thrush in her mouth?  Is she eating?  After reviewing her records, her sodium was low in the ER.  Needs to be rechecked.  Need to schedule non fasting lab tomorrow if possible.

## 2015-12-17 NOTE — Telephone Encounter (Signed)
Spoke with patient, she will get from the pharmacy, thanks

## 2015-12-18 ENCOUNTER — Telehealth: Payer: Self-pay | Admitting: *Deleted

## 2015-12-18 ENCOUNTER — Other Ambulatory Visit: Payer: Self-pay | Admitting: Internal Medicine

## 2015-12-18 ENCOUNTER — Other Ambulatory Visit (INDEPENDENT_AMBULATORY_CARE_PROVIDER_SITE_OTHER): Payer: Medicare Other

## 2015-12-18 DIAGNOSIS — E871 Hypo-osmolality and hyponatremia: Secondary | ICD-10-CM

## 2015-12-18 DIAGNOSIS — D72829 Elevated white blood cell count, unspecified: Secondary | ICD-10-CM

## 2015-12-18 LAB — CBC WITH DIFFERENTIAL/PLATELET
BASOS PCT: 0.5 % (ref 0.0–3.0)
Basophils Absolute: 0 10*3/uL (ref 0.0–0.1)
EOS PCT: 1 % (ref 0.0–5.0)
Eosinophils Absolute: 0.1 10*3/uL (ref 0.0–0.7)
HCT: 33.8 % — ABNORMAL LOW (ref 36.0–46.0)
HEMOGLOBIN: 11.1 g/dL — AB (ref 12.0–15.0)
LYMPHS ABS: 2.1 10*3/uL (ref 0.7–4.0)
Lymphocytes Relative: 22.1 % (ref 12.0–46.0)
MCHC: 33 g/dL (ref 30.0–36.0)
MCV: 96.3 fl (ref 78.0–100.0)
MONO ABS: 0.6 10*3/uL (ref 0.1–1.0)
MONOS PCT: 6.6 % (ref 3.0–12.0)
Neutro Abs: 6.7 10*3/uL (ref 1.4–7.7)
Neutrophils Relative %: 69.8 % (ref 43.0–77.0)
Platelets: 376 10*3/uL (ref 150.0–400.0)
RBC: 3.51 Mil/uL — ABNORMAL LOW (ref 3.87–5.11)
RDW: 15.3 % (ref 11.5–15.5)
WBC: 9.6 10*3/uL (ref 4.0–10.5)

## 2015-12-18 LAB — BASIC METABOLIC PANEL
BUN: 20 mg/dL (ref 6–23)
CALCIUM: 9.8 mg/dL (ref 8.4–10.5)
CO2: 28 meq/L (ref 19–32)
CREATININE: 0.89 mg/dL (ref 0.40–1.20)
Chloride: 90 mEq/L — ABNORMAL LOW (ref 96–112)
GFR: 64.75 mL/min (ref >60.00)
GLUCOSE: 108 mg/dL — AB (ref 70–99)
Potassium: 4 mEq/L (ref 3.5–5.1)
SODIUM: 126 meq/L — AB (ref 135–145)

## 2015-12-18 MED ORDER — NYSTATIN 100000 UNIT/ML MT SUSP: OROMUCOSAL | 0 refills | Status: DC

## 2015-12-18 NOTE — Telephone Encounter (Signed)
Patient stated that [facility]  Did not receive her Rx for the mouth wash.

## 2015-12-18 NOTE — Telephone Encounter (Signed)
Resent to [facility], thanks

## 2015-12-20 ENCOUNTER — Other Ambulatory Visit (INDEPENDENT_AMBULATORY_CARE_PROVIDER_SITE_OTHER): Payer: Medicare Other

## 2015-12-20 ENCOUNTER — Telehealth: Payer: Self-pay | Admitting: *Deleted

## 2015-12-20 DIAGNOSIS — E871 Hypo-osmolality and hyponatremia: Secondary | ICD-10-CM | POA: Diagnosis not present

## 2015-12-20 DIAGNOSIS — K5732 Diverticulitis of large intestine without perforation or abscess without bleeding: Secondary | ICD-10-CM

## 2015-12-20 LAB — SODIUM: Sodium: 131 mEq/L — ABNORMAL LOW (ref 135–145)

## 2015-12-20 NOTE — Telephone Encounter (Signed)
Order placed for GI referral.   

## 2015-12-20 NOTE — Telephone Encounter (Addendum)
Patient has requested to see the gastrologist. She has problems with constipation, and stomach pain. Her medication for diverticulitis has caused issues with her stomach. She stated that she goes to [facility] for diverticulitis. ( Dr. Vira Agar) Pt contact [phone]

## 2015-12-20 NOTE — Telephone Encounter (Signed)
Please advise 

## 2015-12-20 NOTE — Telephone Encounter (Signed)
Patient was informed of results.  SX are a lot better but she does really want to see Dr. Tiffany Kocher.  Patient understood and no questions, comments, or concerns at this time

## 2015-12-20 NOTE — Telephone Encounter (Signed)
Please notify pt that her sodium has improved.  Continue to eat and stay hydrated.  Regarding the referral, I can place the order for the referral, just need to know if she is having worsening symptoms.  If so, she needs to be evaluated.  Please confirm pt doing better.

## 2015-12-21 ENCOUNTER — Other Ambulatory Visit: Payer: Self-pay | Admitting: Internal Medicine

## 2015-12-26 ENCOUNTER — Encounter: Payer: Self-pay | Admitting: Internal Medicine

## 2016-01-02 ENCOUNTER — Ambulatory Visit (INDEPENDENT_AMBULATORY_CARE_PROVIDER_SITE_OTHER): Payer: Medicare Other | Admitting: Internal Medicine

## 2016-01-02 ENCOUNTER — Encounter: Payer: Self-pay | Admitting: Internal Medicine

## 2016-01-02 ENCOUNTER — Other Ambulatory Visit: Payer: Self-pay

## 2016-01-02 VITALS — BP 132/60 | HR 72 | Temp 98.1°F | Resp 16 | Wt 112.0 lb

## 2016-01-02 DIAGNOSIS — D649 Anemia, unspecified: Secondary | ICD-10-CM | POA: Diagnosis not present

## 2016-01-02 DIAGNOSIS — M1990 Unspecified osteoarthritis, unspecified site: Secondary | ICD-10-CM | POA: Diagnosis not present

## 2016-01-02 DIAGNOSIS — Z658 Other specified problems related to psychosocial circumstances: Secondary | ICD-10-CM

## 2016-01-02 DIAGNOSIS — E871 Hypo-osmolality and hyponatremia: Secondary | ICD-10-CM

## 2016-01-02 DIAGNOSIS — F439 Reaction to severe stress, unspecified: Secondary | ICD-10-CM

## 2016-01-02 DIAGNOSIS — I10 Essential (primary) hypertension: Secondary | ICD-10-CM

## 2016-01-02 DIAGNOSIS — R634 Abnormal weight loss: Secondary | ICD-10-CM

## 2016-01-02 LAB — BASIC METABOLIC PANEL
BUN: 26 mg/dL — AB (ref 6–23)
CHLORIDE: 97 meq/L (ref 96–112)
CO2: 29 meq/L (ref 19–32)
Calcium: 9.2 mg/dL (ref 8.4–10.5)
Creatinine, Ser: 1.04 mg/dL (ref 0.40–1.20)
GFR: 54.09 mL/min — ABNORMAL LOW (ref >60.00)
GLUCOSE: 132 mg/dL — AB (ref 70–99)
POTASSIUM: 3.5 meq/L (ref 3.5–5.1)
Sodium: 132 mEq/L — ABNORMAL LOW (ref 135–145)

## 2016-01-02 LAB — CBC WITH DIFFERENTIAL/PLATELET
BASOS PCT: 0.6 % (ref 0.0–3.0)
Basophils Absolute: 0 10*3/uL (ref 0.0–0.1)
EOS ABS: 0.1 10*3/uL (ref 0.0–0.7)
Eosinophils Relative: 1.3 % (ref 0.0–5.0)
HEMATOCRIT: 33 % — AB (ref 36.0–46.0)
HEMOGLOBIN: 10.9 g/dL — AB (ref 12.0–15.0)
LYMPHS PCT: 43 % (ref 12.0–46.0)
Lymphs Abs: 3.4 10*3/uL (ref 0.7–4.0)
MCHC: 33.2 g/dL (ref 30.0–36.0)
MCV: 96.7 fl (ref 78.0–100.0)
Monocytes Absolute: 0.6 10*3/uL (ref 0.1–1.0)
Monocytes Relative: 8.1 % (ref 3.0–12.0)
Neutro Abs: 3.7 10*3/uL (ref 1.4–7.7)
Neutrophils Relative %: 47 % (ref 43.0–77.0)
Platelets: 556 10*3/uL — ABNORMAL HIGH (ref 150.0–400.0)
RBC: 3.41 Mil/uL — ABNORMAL LOW (ref 3.87–5.11)
RDW: 15.2 % (ref 11.5–15.5)
WBC: 7.9 10*3/uL (ref 4.0–10.5)

## 2016-01-02 NOTE — Progress Notes (Signed)
Patient ID: Ambrosia Usry, female   DOB: December 07, 1932, 82 y.o.   MRN: [id number]   Subjective:    Patient ID: [name], female    DOB: Oct 15, 1932, 82 y.o.   MRN: [id number]  HPI  Patient here for a scheduled follow up.  Recently evaluated in the ER and diagnosed with diverticulitis.  Took abx.  Symptoms improved.  No abdominal pain.  No diarrhea.  States feels like she is back to normal.  No chest pain.  She is eating better.  Drinking.  No chest pain.  No sob.  No acid reflux.  S/p injection right leg.  Off remicade now since recent infection.     Past Medical History:  Diagnosis Date  . Diverticulosis   . Fibrocystic breast disease   . GERD (gastroesophageal reflux disease)   . Hypercholesterolemia   . Hypertension   . Hypertension   . IBS (irritable bowel syndrome)   . Inflammatory arthritis ([facility])    positive anti-CCP abs, s/p prednisone, MTX, Remicade  . Nephrolithiasis   . Osteopenia    GI upset with Fosamax  . Pancreatitis 2007   s/p ERCP   Past Surgical History:  Procedure Laterality Date  . ABDOMINAL HYSTERECTOMY  1993   ovaries not removed  . BREAST BIOPSY Left 1984   negative  . BREAST CYST EXCISION Left 2010   negative  . LITHOTRIPSY    . LUMBAR LAMINECTOMY  1983  . TRIGGER FINGER RELEASE     right ring finger   Family History  Problem Relation Age of Onset  . Ovarian cancer Mother   . Renal Disease Mother   . Heart disease Mother   . Diabetes Mother   . Hypertension Mother    Social History   Social History  . Marital status: Married    Spouse name: N/A  . Number of children: 4  . Years of education: N/A   Social History Main Topics  . Smoking status: Never Smoker  . Smokeless tobacco: Never Used  . Alcohol use No  . Drug use: No  . Sexual activity: Not Asked   Other Topics Concern  . None   Social History Narrative  . None    Outpatient Encounter Prescriptions as of 01/02/2016  Medication Sig  . bisacodyl  (DULCOLAX) 5 MG EC tablet Take 5 mg by mouth daily as needed for moderate constipation.  . Cholecalciferol (VITAMIN D) 2000 UNITS tablet Take 2,000 Units by mouth daily.  Marland Kitchen diltiazem (DILACOR XR) 240 MG 24 hr capsule Take 1 capsule (240 mg total) by mouth daily.  . fluticasone (FLONASE) 50 MCG/ACT nasal spray USE TWO SPRAY(S) IN EACH NOSTRIL ONCE DAILY  . folic acid (FOLVITE) 1 MG tablet Take 1 mg by mouth 2 (two) times daily.   Marland Kitchen losartan (COZAAR) 100 MG tablet Take 1 tablet (100 mg total) by mouth daily.  Marland Kitchen lovastatin (MEVACOR) 40 MG tablet Take 1 tablet (40 mg total) by mouth daily.  . magnesium oxide (MAG-OX) 400 (241.3 MG) MG tablet Take 400 mg by mouth daily.  . methotrexate 25 MG/ML SOLN Inject 17.5 mg into the skin once a week. Patient takes 17.5 mg (0.7 ml) on Monday or Tuesday of each week.  Marland Kitchen omeprazole (PRILOSEC) 20 MG capsule Take 1 capsule (20 mg total) by mouth daily.  Marland Kitchen penciclovir (DENAVIR) 1 % cream Apply 1 application topically every 2 (two) hours. For Fever Blister (Patient taking differently: Apply 1 application topically every 2 (two) hours as  needed (fever blister). For Fever Blister)  . polyethylene glycol (MIRALAX / GLYCOLAX) packet Take 17 g by mouth daily.  . potassium chloride 20 MEQ TBCR Take 10 mEq by mouth daily.  . traMADol (ULTRAM) 50 MG tablet Take 1-2 tablets by mouth every 6 hours as needed for moderate to severe pain  . triamterene-hydrochlorothiazide (MAXZIDE-25) 37.5-25 MG tablet Take 1 tablet by mouth daily. (Patient taking differently: Take 0.5 tablets by mouth daily. )  . [DISCONTINUED] docusate sodium (COLACE) 100 MG capsule Take 1 tablet once or twice daily as needed for constipation while taking narcotic pain medicine  . [DISCONTINUED] fluconazole (DIFLUCAN) 150 MG tablet Take 1 tablet by mouth if you develop a yeast infection (including vaginal or thrush).  If you still have symptoms after 72 hours, take another tablet.  . [DISCONTINUED] nystatin  (MYCOSTATIN) 100000 UNIT/ML suspension 5cc's rinse and spit tid  . acyclovir ointment (ZOVIRAX) 5 % Apply 1 application topically 4 (four) times daily as needed (fever blister). Apply to lip (Patient not taking: Reported on 01/02/2016)  . InFLIXimab (REMICADE IV) Inject into the vein every 6 (six) weeks. Per Dr Jefm Bryant  . ondansetron (ZOFRAN ODT) 4 MG disintegrating tablet Allow 1-2 tablets to dissolve in your mouth every 8 hours as needed for nausea/vomiting (Patient not taking: Reported on 01/02/2016)   No facility-administered encounter medications on file as of 01/02/2016.     Review of Systems  Constitutional: Negative for appetite change and unexpected weight change.       Appetite improved.    HENT: Negative for congestion and sinus pressure.   Respiratory: Negative for cough, chest tightness and shortness of breath.   Cardiovascular: Negative for chest pain, palpitations and leg swelling.  Gastrointestinal: Negative for abdominal pain, diarrhea, nausea and vomiting.  Genitourinary: Negative for difficulty urinating and dysuria.  Skin: Negative for color change and rash.  Neurological: Negative for dizziness, light-headedness and headaches.  Psychiatric/Behavioral: Negative for agitation and dysphoric mood.       Objective:     Blood pressure rechecked by me:  132/60  Physical Exam  Constitutional: She appears well-developed and well-nourished. No distress.  HENT:  Nose: Nose normal.  Mouth/Throat: Oropharynx is clear and moist.  Neck: Neck supple. No thyromegaly present.  Cardiovascular: Normal rate and regular rhythm.   Pulmonary/Chest: Breath sounds normal. No respiratory distress. She has no wheezes.  Abdominal: Soft. Bowel sounds are normal. There is no tenderness.  Musculoskeletal: She exhibits no edema or tenderness.  Lymphadenopathy:    She has no cervical adenopathy.  Skin: Skin is warm and dry. No erythema.  Psychiatric: She has a normal mood and affect. Her  behavior is normal.    BP 132/60   Pulse 72   Temp 98.1 F (36.7 C) (Oral)   Resp 16   Wt 112 lb (50.8 kg)   BMI 21.16 kg/m  Wt Readings from Last 3 Encounters:  01/02/16 112 lb (50.8 kg)  12/03/15 111 lb (50.3 kg)  12/03/15 111 lb 8 oz (50.6 kg)     Lab Results  Component Value Date   WBC 7.9 01/02/2016   HGB 10.9 (L) 01/02/2016   HCT 33.0 (L) 01/02/2016   PLT 556.0 (H) 01/02/2016   GLUCOSE 132 (H) 01/02/2016   CHOL 163 04/18/2015   TRIG 166.0 (H) 04/18/2015   HDL 69.70 04/18/2015   LDLCALC 60 04/18/2015   ALT 35 12/03/2015   AST 30 12/03/2015   NA 132 (L) 01/02/2016   K 3.5 01/02/2016  CL 97 01/02/2016   CREATININE 1.04 01/02/2016   BUN 26 (H) 01/02/2016   CO2 29 01/02/2016   TSH 1.05 04/18/2015    Ct Abdomen Pelvis W Contrast  Result Date: 12/03/2015 CLINICAL DATA:  Left lower quadrant pain for 4 days. Elevated white blood cell count. EXAM: CT ABDOMEN AND PELVIS WITH CONTRAST TECHNIQUE: Multidetector CT imaging of the abdomen and pelvis was performed using the standard protocol following bolus administration of intravenous contrast. CONTRAST:  75 ml ISOVUE-300 IOPAMIDOL (ISOVUE-300) INJECTION 61% COMPARISON:  CT abdomen and pelvis 01/11/2010 and 04/22/2008. FINDINGS: The lung bases are clear.  No pleural or pericardial effusion. The gallbladder, liver, spleen, left kidney and adrenal glands appear normal. Two small low attenuating lesions in the right kidney cannot be definitively characterized but are likely cysts. Multiple low attenuating lesions are identified in the pancreas. Some of these were present on the prior study. The largest measures 2.2 by 2.4 cm at the junction of the body and neck and is increased in size from 1 cm in diameter on the most recent prior exam. Colonic diverticulosis is worst in the sigmoid. Marked wall thickening and stranding about the distal sigmoid consistent with acute diverticulitis is seen. No abscess or perforation is identified.  There is no pneumatosis or portal venous gas. The stomach and small bowel appear normal. No lymphadenopathy. No lytic or sclerotic bony lesion. The patient is status post right L3-4 laminotomy. IMPRESSION: The study is positive for acute sigmoid diverticulitis without abscess or perforation. Multiple low attenuating lesions in the pancreas. Nonemergent MRI/MRCP is recommended for further evaluation. Electronically Signed   By: Inge Rise M.D.   On: 12/03/2015 18:52       Assessment & Plan:   Problem List Items Addressed This Visit    Anemia - Primary    Recheck cbc today.  Follow.        Relevant Orders   CBC with Differential/Platelet (Completed)   Hypertension    Blood pressure on recheck improved.  Same medication regimen.  Follow pressures.  Follow metabolic panel.        Hyponatremia    Last sodium level improved.  Eating better.  Recheck sodium today.       Relevant Orders   Basic metabolic panel (Completed)   Inflammatory arthritis ([place])    Off remicade now.  Continue f/u with rheumatology.       Loss of weight    Weight is stable now.  She is eating better.  Bowels better.       Stress    Desires no further intervention.  Follow.  Overall she feels she is handling things well.         Other Visit Diagnoses   None.      Einar Pheasant, MD

## 2016-01-03 ENCOUNTER — Other Ambulatory Visit: Payer: Self-pay | Admitting: Internal Medicine

## 2016-01-03 DIAGNOSIS — D649 Anemia, unspecified: Secondary | ICD-10-CM

## 2016-01-07 ENCOUNTER — Encounter: Payer: Self-pay | Admitting: Internal Medicine

## 2016-01-07 NOTE — Assessment & Plan Note (Signed)
Off remicade now.  Continue f/u with rheumatology.

## 2016-01-07 NOTE — Assessment & Plan Note (Signed)
Recheck cbc today.  Follow.

## 2016-01-07 NOTE — Assessment & Plan Note (Signed)
Desires no further intervention.  Follow.  Overall she feels she is handling things well.

## 2016-01-07 NOTE — Assessment & Plan Note (Signed)
Weight is stable now.  She is eating better.  Bowels better.

## 2016-01-07 NOTE — Assessment & Plan Note (Signed)
Blood pressure on recheck improved.  Same medication regimen.  Follow pressures.  Follow metabolic panel.   

## 2016-01-07 NOTE — Assessment & Plan Note (Signed)
Last sodium level improved.  Eating better.  Recheck sodium today.

## 2016-01-16 ENCOUNTER — Other Ambulatory Visit (INDEPENDENT_AMBULATORY_CARE_PROVIDER_SITE_OTHER): Payer: Medicare Other

## 2016-01-16 DIAGNOSIS — D649 Anemia, unspecified: Secondary | ICD-10-CM

## 2016-01-16 LAB — CBC WITH DIFFERENTIAL/PLATELET
BASOS ABS: 0 10*3/uL (ref 0.0–0.1)
Basophils Relative: 0.6 % (ref 0.0–3.0)
EOS PCT: 2.3 % (ref 0.0–5.0)
Eosinophils Absolute: 0.2 10*3/uL (ref 0.0–0.7)
HCT: 33.1 % — ABNORMAL LOW (ref 36.0–46.0)
HEMOGLOBIN: 11.2 g/dL — AB (ref 12.0–15.0)
LYMPHS ABS: 3.8 10*3/uL (ref 0.7–4.0)
Lymphocytes Relative: 57 % — ABNORMAL HIGH (ref 12.0–46.0)
MCHC: 33.9 g/dL (ref 30.0–36.0)
MCV: 95 fl (ref 78.0–100.0)
MONO ABS: 0.7 10*3/uL (ref 0.1–1.0)
MONOS PCT: 9.9 % (ref 3.0–12.0)
NEUTROS PCT: 30.2 % — AB (ref 43.0–77.0)
Neutro Abs: 2 10*3/uL (ref 1.4–7.7)
Platelets: 354 10*3/uL (ref 150.0–400.0)
RBC: 3.49 Mil/uL — AB (ref 3.87–5.11)
RDW: 14.7 % (ref 11.5–15.5)
WBC: 6.7 10*3/uL (ref 4.0–10.5)

## 2016-01-22 DIAGNOSIS — M4807 Spinal stenosis, lumbosacral region: Secondary | ICD-10-CM | POA: Diagnosis not present

## 2016-01-22 DIAGNOSIS — M7061 Trochanteric bursitis, right hip: Secondary | ICD-10-CM | POA: Diagnosis not present

## 2016-01-29 ENCOUNTER — Other Ambulatory Visit: Payer: Self-pay | Admitting: Nurse Practitioner

## 2016-01-29 DIAGNOSIS — R933 Abnormal findings on diagnostic imaging of other parts of digestive tract: Secondary | ICD-10-CM

## 2016-01-29 DIAGNOSIS — K5730 Diverticulosis of large intestine without perforation or abscess without bleeding: Secondary | ICD-10-CM | POA: Diagnosis not present

## 2016-01-29 DIAGNOSIS — K862 Cyst of pancreas: Secondary | ICD-10-CM

## 2016-01-29 DIAGNOSIS — R748 Abnormal levels of other serum enzymes: Secondary | ICD-10-CM | POA: Diagnosis not present

## 2016-01-30 DIAGNOSIS — K862 Cyst of pancreas: Secondary | ICD-10-CM | POA: Diagnosis not present

## 2016-01-30 DIAGNOSIS — R748 Abnormal levels of other serum enzymes: Secondary | ICD-10-CM | POA: Diagnosis not present

## 2016-02-07 ENCOUNTER — Ambulatory Visit
Admission: RE | Admit: 2016-02-07 | Discharge: 2016-02-07 | Disposition: A | Discharge status: Home / Self Care | Payer: Medicare Other | Source: Ambulatory Visit | Attending: Nurse Practitioner | Admitting: Nurse Practitioner

## 2016-02-07 DIAGNOSIS — K862 Cyst of pancreas: Secondary | ICD-10-CM

## 2016-02-07 DIAGNOSIS — K5730 Diverticulosis of large intestine without perforation or abscess without bleeding: Secondary | ICD-10-CM

## 2016-02-07 DIAGNOSIS — R933 Abnormal findings on diagnostic imaging of other parts of digestive tract: Secondary | ICD-10-CM

## 2016-02-07 MED ORDER — GADOBENATE DIMEGLUMINE 529 MG/ML IV SOLN: 10.0000 mL | Freq: Once | INTRAVENOUS | Status: DC | PRN

## 2016-02-18 DIAGNOSIS — M549 Dorsalgia, unspecified: Secondary | ICD-10-CM | POA: Diagnosis not present

## 2016-02-18 DIAGNOSIS — M5430 Sciatica, unspecified side: Secondary | ICD-10-CM | POA: Diagnosis not present

## 2016-02-18 DIAGNOSIS — M0579 Rheumatoid arthritis with rheumatoid factor of multiple sites without organ or systems involvement: Secondary | ICD-10-CM | POA: Diagnosis not present

## 2016-02-18 DIAGNOSIS — M65331 Trigger finger, right middle finger: Secondary | ICD-10-CM | POA: Diagnosis not present

## 2016-02-26 ENCOUNTER — Ambulatory Visit (INDEPENDENT_AMBULATORY_CARE_PROVIDER_SITE_OTHER): Payer: Medicare Other | Admitting: Internal Medicine

## 2016-02-26 ENCOUNTER — Encounter: Payer: Self-pay | Admitting: Internal Medicine

## 2016-02-26 DIAGNOSIS — F439 Reaction to severe stress, unspecified: Secondary | ICD-10-CM | POA: Diagnosis not present

## 2016-02-26 DIAGNOSIS — I10 Essential (primary) hypertension: Secondary | ICD-10-CM

## 2016-02-26 DIAGNOSIS — E7800 Pure hypercholesterolemia, unspecified: Secondary | ICD-10-CM

## 2016-02-26 DIAGNOSIS — R634 Abnormal weight loss: Secondary | ICD-10-CM | POA: Diagnosis not present

## 2016-02-26 DIAGNOSIS — R109 Unspecified abdominal pain: Secondary | ICD-10-CM

## 2016-02-26 DIAGNOSIS — M1990 Unspecified osteoarthritis, unspecified site: Secondary | ICD-10-CM

## 2016-02-26 DIAGNOSIS — D649 Anemia, unspecified: Secondary | ICD-10-CM

## 2016-02-26 DIAGNOSIS — K5791 Diverticulosis of intestine, part unspecified, without perforation or abscess with bleeding: Secondary | ICD-10-CM

## 2016-02-26 DIAGNOSIS — Z23 Encounter for immunization: Secondary | ICD-10-CM

## 2016-02-26 DIAGNOSIS — K219 Gastro-esophageal reflux disease without esophagitis: Secondary | ICD-10-CM

## 2016-02-26 DIAGNOSIS — E871 Hypo-osmolality and hyponatremia: Secondary | ICD-10-CM

## 2016-02-26 NOTE — Progress Notes (Signed)
Patient ID: Stacey Snyder, female   DOB: [birth year], 82 y.o.   MRN: [id number]   Subjective:    Patient ID: Stacey Snyder, female    DOB: 1932-06-27, 82 y.o.   MRN: [id number]  HPI  Patient here for a scheduled follow up.  Recently admitted with sepsis/diverticulitis/UTI.  See previous note.  Saw GI.  Being w/up for pancreatic cyst.  Unable to have MRI secondary to could not tolerate being closed in machine.  Is rescheduled for 03/06/16.  She is feeling better.  Is eating better.  No abdominal pain.  Bowels (she reports) are regular. Breathing stable.  Overall feels better.  Weight stable.  Still decreased.     Past Medical History:  Diagnosis Date  . Diverticulosis   . Fibrocystic breast disease   . GERD (gastroesophageal reflux disease)   . Hypercholesterolemia   . Hypertension   . Hypertension   . IBS (irritable bowel syndrome)   . Inflammatory arthritis    positive anti-CCP abs, s/p prednisone, MTX, Remicade  . Nephrolithiasis   . Osteopenia    GI upset with Fosamax  . Pancreatitis 2007   s/p ERCP   Past Surgical History:  Procedure Laterality Date  . ABDOMINAL HYSTERECTOMY  1993   ovaries not removed  . BREAST BIOPSY Left 1984   negative  . BREAST CYST EXCISION Left 2010   negative  . LITHOTRIPSY    . LUMBAR LAMINECTOMY  1983  . TRIGGER FINGER RELEASE     right ring finger   Family History  Problem Relation Age of Onset  . Ovarian cancer Mother   . Renal Disease Mother   . Heart disease Mother   . Diabetes Mother   . Hypertension Mother    Social History   Social History  . Marital status: Married    Spouse name: N/A  . Number of children: 4  . Years of education: N/A   Social History Main Topics  . Smoking status: Never Smoker  . Smokeless tobacco: Never Used  . Alcohol use No  . Drug use: No  . Sexual activity: Not Asked   Other Topics Concern  . None   Social History Narrative  . None    Outpatient Encounter Prescriptions  as of 02/26/2016  Medication Sig  . acyclovir ointment (ZOVIRAX) 5 % Apply 1 application topically 4 (four) times daily as needed (fever blister). Apply to lip  . bisacodyl (DULCOLAX) 5 MG EC tablet Take 5 mg by mouth daily as needed for moderate constipation.  . Cholecalciferol (VITAMIN D) 2000 UNITS tablet Take 2,000 Units by mouth daily.  Marland Kitchen diltiazem (DILACOR XR) 240 MG 24 hr capsule Take 1 capsule (240 mg total) by mouth daily.  . fluticasone (FLONASE) 50 MCG/ACT nasal spray USE TWO SPRAY(S) IN EACH NOSTRIL ONCE DAILY  . folic acid (FOLVITE) 1 MG tablet Take 1 mg by mouth 2 (two) times daily.   . InFLIXimab (REMICADE IV) Inject into the vein every 6 (six) weeks. Per Dr Jefm Bryant  . losartan (COZAAR) 100 MG tablet Take 1 tablet (100 mg total) by mouth daily.  Marland Kitchen lovastatin (MEVACOR) 40 MG tablet Take 1 tablet (40 mg total) by mouth daily.  . magnesium oxide (MAG-OX) 400 (241.3 MG) MG tablet Take 400 mg by mouth daily.  . methotrexate 25 MG/ML SOLN Inject 17.5 mg into the skin once a week. Patient takes 17.5 mg (0.7 ml) on Monday or Tuesday of each week.  Marland Kitchen omeprazole (PRILOSEC) 20 MG capsule  Take 1 capsule (20 mg total) by mouth daily.  . ondansetron (ZOFRAN ODT) 4 MG disintegrating tablet Allow 1-2 tablets to dissolve in your mouth every 8 hours as needed for nausea/vomiting  . penciclovir (DENAVIR) 1 % cream Apply 1 application topically every 2 (two) hours. For Fever Blister (Patient taking differently: Apply 1 application topically every 2 (two) hours as needed (fever blister). For Fever Blister)  . polyethylene glycol (MIRALAX / GLYCOLAX) packet Take 17 g by mouth daily.  . potassium chloride 20 MEQ TBCR Take 10 mEq by mouth daily.  . traMADol (ULTRAM) 50 MG tablet Take 1-2 tablets by mouth every 6 hours as needed for moderate to severe pain  . triamterene-hydrochlorothiazide (MAXZIDE-25) 37.5-25 MG tablet Take 1 tablet by mouth daily. (Patient taking differently: Take 0.5 tablets by mouth  daily. )   No facility-administered encounter medications on file as of 02/26/2016.     Review of Systems  Constitutional: Negative for appetite change and unexpected weight change.  HENT: Negative for congestion and sinus pressure.   Respiratory: Negative for cough, chest tightness and shortness of breath.   Cardiovascular: Negative for chest pain, palpitations and leg swelling.  Gastrointestinal: Negative for abdominal pain, diarrhea, nausea and vomiting.  Genitourinary: Negative for difficulty urinating and dysuria.  Musculoskeletal:       Joint pain and stiffness.  Followed by Dr Jefm Bryant.    Skin: Negative for color change and rash.  Neurological: Negative for dizziness, light-headedness and headaches.  Psychiatric/Behavioral: Negative for agitation and dysphoric mood.       Objective:     Blood pressure rechecked by me:  134/64  Physical Exam  Constitutional: She appears well-developed. No distress.  HENT:  Nose: Nose normal.  Mouth/Throat: Oropharynx is clear and moist.  Neck: Neck supple. No thyromegaly present.  Cardiovascular: Normal rate and regular rhythm.   Pulmonary/Chest: Breath sounds normal. No respiratory distress. She has no wheezes.  Abdominal: Soft. Bowel sounds are normal. There is no tenderness.  Musculoskeletal: She exhibits no edema or tenderness.  Lymphadenopathy:    She has no cervical adenopathy.  Skin: No rash noted. No erythema.  Psychiatric: She has a normal mood and affect. Her behavior is normal.    BP (!) 102/58   Pulse 66   Temp 97.7 F (36.5 C)   Wt 112 lb (50.8 kg)   SpO2 99%   BMI 21.16 kg/m  Wt Readings from Last 3 Encounters:  02/26/16 112 lb (50.8 kg)  01/02/16 112 lb (50.8 kg)  12/03/15 111 lb (50.3 kg)     Lab Results  Component Value Date   WBC 6.7 01/16/2016   HGB 11.2 (L) 01/16/2016   HCT 33.1 (L) 01/16/2016   PLT 354.0 01/16/2016   GLUCOSE 132 (H) 01/02/2016   CHOL 163 04/18/2015   TRIG 166.0 (H) 04/18/2015     HDL 69.70 04/18/2015   LDLCALC 60 04/18/2015   ALT 35 12/03/2015   AST 30 12/03/2015   NA 132 (L) 01/02/2016   K 3.5 01/02/2016   CL 97 01/02/2016   CREATININE 1.04 01/02/2016   BUN 26 (H) 01/02/2016   CO2 29 01/02/2016   TSH 1.05 04/18/2015       Assessment & Plan:   Problem List Items Addressed This Visit    Abdominal pain    No pain now.  Recently admitted with diverticulitis.  Seeing GI. Planning for MRI 03/06/16 - to evaluate possible pancreatic cyst.  Follow.       Anemia  Last hgb stable 11.2.  Follow.        Diverticulosis    Recent admission with diverticulitis.  No pain now.  Eating.  Bowels regular.  Follow.       GERD (gastroesophageal reflux disease)    Controlled on omeprazole.       Hypercholesterolemia    Low cholesterol diet and exercise.  Follow lipid panel and liver function tests.  On lovastatin.        Hypertension    Blood pressure under good control.  Continue same medication regimen.  Follow pressures.  Follow metabolic panel.        Hyponatremia    Sodium stable.  Follow.  Eating better.       Inflammatory arthritis ([place])    Off remicade.  Followed by Dr Jefm Bryant.       Loss of weight    Weight stable from last check.  Encourage increased po intake.  She is eating better.        Stress    Desires no further intervention.  Overall feels she is handling things relatively well.         Other Visit Diagnoses    Encounter for immunization       Relevant Orders   Flu vaccine HIGH DOSE PF (Completed)       Einar Pheasant, MD

## 2016-02-29 ENCOUNTER — Ambulatory Visit: Payer: Medicare Other | Admitting: Internal Medicine

## 2016-03-02 NOTE — Assessment & Plan Note (Signed)
Blood pressure under good control.  Continue same medication regimen.  Follow pressures.  Follow metabolic panel.   

## 2016-03-02 NOTE — Assessment & Plan Note (Signed)
Weight stable from last check.  Encourage increased po intake.  She is eating better.

## 2016-03-02 NOTE — Assessment & Plan Note (Signed)
Last hgb stable 11.2.  Follow.

## 2016-03-02 NOTE — Assessment & Plan Note (Signed)
Off remicade.  Followed by Dr Jefm Bryant.

## 2016-03-02 NOTE — Assessment & Plan Note (Signed)
Controlled on omeprazole.   

## 2016-03-02 NOTE — Assessment & Plan Note (Signed)
Recent admission with diverticulitis.  No pain now.  Eating.  Bowels regular.  Follow.

## 2016-03-02 NOTE — Assessment & Plan Note (Signed)
No pain now.  Recently admitted with diverticulitis.  Seeing GI. Planning for MRI 03/06/16 - to evaluate possible pancreatic cyst.  Follow.

## 2016-03-02 NOTE — Assessment & Plan Note (Signed)
Sodium stable.  Follow.  Eating better.

## 2016-03-02 NOTE — Assessment & Plan Note (Signed)
Low cholesterol diet and exercise.  Follow lipid panel and liver function tests.  On lovastatin.   

## 2016-03-02 NOTE — Assessment & Plan Note (Signed)
Desires no further intervention.  Overall feels she is handling things relatively well.

## 2016-03-05 ENCOUNTER — Ambulatory Visit
Admission: RE | Admit: 2016-03-05 | Discharge: 2016-03-05 | Disposition: A | Discharge status: Home / Self Care | Payer: Medicare Other | Source: Ambulatory Visit | Attending: Nurse Practitioner | Admitting: Nurse Practitioner

## 2016-03-05 ENCOUNTER — Other Ambulatory Visit: Payer: Self-pay | Admitting: Nurse Practitioner

## 2016-03-05 DIAGNOSIS — K862 Cyst of pancreas: Secondary | ICD-10-CM

## 2016-03-05 DIAGNOSIS — R935 Abnormal findings on diagnostic imaging of other abdominal regions, including retroperitoneum: Secondary | ICD-10-CM | POA: Diagnosis not present

## 2016-03-05 DIAGNOSIS — K5730 Diverticulosis of large intestine without perforation or abscess without bleeding: Secondary | ICD-10-CM | POA: Diagnosis not present

## 2016-03-05 DIAGNOSIS — N281 Cyst of kidney, acquired: Secondary | ICD-10-CM | POA: Diagnosis not present

## 2016-03-05 DIAGNOSIS — R933 Abnormal findings on diagnostic imaging of other parts of digestive tract: Secondary | ICD-10-CM | POA: Diagnosis not present

## 2016-03-05 MED ORDER — GADOBENATE DIMEGLUMINE 529 MG/ML IV SOLN
10.0000 mL | Freq: Once | INTRAVENOUS | Status: AC | PRN
  Administered 2016-03-05: 10 mL via INTRAVENOUS

## 2016-03-10 DIAGNOSIS — H43813 Vitreous degeneration, bilateral: Secondary | ICD-10-CM | POA: Diagnosis not present

## 2016-03-12 ENCOUNTER — Ambulatory Visit: Payer: Medicare Other

## 2016-03-24 DIAGNOSIS — A749 Chlamydial infection, unspecified: Secondary | ICD-10-CM | POA: Diagnosis not present

## 2016-03-24 DIAGNOSIS — R938 Abnormal findings on diagnostic imaging of other specified body structures: Secondary | ICD-10-CM | POA: Diagnosis not present

## 2016-03-24 DIAGNOSIS — K862 Cyst of pancreas: Secondary | ICD-10-CM | POA: Diagnosis not present

## 2016-03-24 DIAGNOSIS — R933 Abnormal findings on diagnostic imaging of other parts of digestive tract: Secondary | ICD-10-CM | POA: Diagnosis not present

## 2016-04-04 ENCOUNTER — Other Ambulatory Visit: Payer: Self-pay | Admitting: Internal Medicine

## 2016-04-07 ENCOUNTER — Telehealth: Payer: Self-pay | Admitting: Internal Medicine

## 2016-04-07 NOTE — Telephone Encounter (Signed)
Pt declined to get the AWV. Thank You!

## 2016-04-15 DIAGNOSIS — G8929 Other chronic pain: Secondary | ICD-10-CM | POA: Diagnosis not present

## 2016-04-15 DIAGNOSIS — M7061 Trochanteric bursitis, right hip: Secondary | ICD-10-CM | POA: Diagnosis not present

## 2016-04-15 DIAGNOSIS — M5441 Lumbago with sciatica, right side: Secondary | ICD-10-CM | POA: Diagnosis not present

## 2016-04-15 DIAGNOSIS — M4807 Spinal stenosis, lumbosacral region: Secondary | ICD-10-CM | POA: Diagnosis not present

## 2016-04-21 DIAGNOSIS — M0579 Rheumatoid arthritis with rheumatoid factor of multiple sites without organ or systems involvement: Secondary | ICD-10-CM | POA: Diagnosis not present

## 2016-04-21 DIAGNOSIS — Z79899 Other long term (current) drug therapy: Secondary | ICD-10-CM | POA: Diagnosis not present

## 2016-04-21 DIAGNOSIS — M0609 Rheumatoid arthritis without rheumatoid factor, multiple sites: Secondary | ICD-10-CM | POA: Diagnosis not present

## 2016-05-02 ENCOUNTER — Encounter: Payer: Self-pay | Admitting: Internal Medicine

## 2016-05-02 ENCOUNTER — Ambulatory Visit (INDEPENDENT_AMBULATORY_CARE_PROVIDER_SITE_OTHER): Payer: Medicare Other | Admitting: Internal Medicine

## 2016-05-02 VITALS — BP 144/68 | HR 73 | Temp 98.1°F | Ht 61.0 in | Wt 110.4 lb

## 2016-05-02 DIAGNOSIS — E7800 Pure hypercholesterolemia, unspecified: Secondary | ICD-10-CM

## 2016-05-02 DIAGNOSIS — M1990 Unspecified osteoarthritis, unspecified site: Secondary | ICD-10-CM

## 2016-05-02 DIAGNOSIS — M1380 Other specified arthritis, unspecified site: Secondary | ICD-10-CM

## 2016-05-02 DIAGNOSIS — R945 Abnormal results of liver function studies: Secondary | ICD-10-CM

## 2016-05-02 DIAGNOSIS — I10 Essential (primary) hypertension: Secondary | ICD-10-CM

## 2016-05-02 DIAGNOSIS — K219 Gastro-esophageal reflux disease without esophagitis: Secondary | ICD-10-CM

## 2016-05-02 DIAGNOSIS — D649 Anemia, unspecified: Secondary | ICD-10-CM

## 2016-05-02 DIAGNOSIS — F439 Reaction to severe stress, unspecified: Secondary | ICD-10-CM

## 2016-05-02 DIAGNOSIS — R634 Abnormal weight loss: Secondary | ICD-10-CM | POA: Diagnosis not present

## 2016-05-02 DIAGNOSIS — R7989 Other specified abnormal findings of blood chemistry: Secondary | ICD-10-CM

## 2016-05-02 DIAGNOSIS — Z23 Encounter for immunization: Secondary | ICD-10-CM | POA: Diagnosis not present

## 2016-05-02 NOTE — Progress Notes (Signed)
Patient ID: Stacey Snyder, female   DOB: August 24, 1932, 82 y.o.   MRN: [id number]   Subjective:    Patient ID: Stacey Snyder, female    DOB: [birth year], 82 y.o.   MRN: [id number]  HPI  Patient here for a scheduled follow up.  She recently had flare of diverticulitis and sepsis.  See previous notes.  Doing much better now.  Eating.  Has a good appetite.  No abdominal pain or cramping.  Taking miralax daily and this is working well for her.  Bowels better and moving well.  Started back on remicade last week.  Planning to f/u with Dr Sharlet Salina 05/2016 - for injection.  Hip is doing better s/p previous injection by Dr HBK.  Has been evaluated by GI.  Had MRI - pancreas lesions as outlined.  Has f/u MRI abdomen scheduled for 01/2017.  No chest pain.  No sob.  Increased stress taking care of her husband.  Overall she feels she is handling things relatively well.     Past Medical History:  Diagnosis Date  . Diverticulosis   . Fibrocystic breast disease   . GERD (gastroesophageal reflux disease)   . Hypercholesterolemia   . Hypertension   . Hypertension   . IBS (irritable bowel syndrome)   . Inflammatory arthritis    positive anti-CCP abs, s/p prednisone, MTX, Remicade  . Nephrolithiasis   . Osteopenia    GI upset with Fosamax  . Pancreatitis 2007   s/p ERCP   Past Surgical History:  Procedure Laterality Date  . ABDOMINAL HYSTERECTOMY  1993   ovaries not removed  . BREAST BIOPSY Left 1984   negative  . BREAST CYST EXCISION Left 2010   negative  . LITHOTRIPSY    . LUMBAR LAMINECTOMY  1983  . TRIGGER FINGER RELEASE     right ring finger   Family History  Problem Relation Age of Onset  . Ovarian cancer Mother   . Renal Disease Mother   . Heart disease Mother   . Diabetes Mother   . Hypertension Mother    Social History   Social History  . Marital status: Married    Spouse name: N/A  . Number of children: 4  . Years of education: N/A   Social History Main Topics   . Smoking status: Never Smoker  . Smokeless tobacco: Never Used  . Alcohol use No  . Drug use: No  . Sexual activity: Not Asked   Other Topics Concern  . None   Social History Narrative  . None    Outpatient Encounter Prescriptions as of 05/02/2016  Medication Sig  . acyclovir ointment (ZOVIRAX) 5 % Apply 1 application topically 4 (four) times daily as needed (fever blister). Apply to lip  . bisacodyl (DULCOLAX) 5 MG EC tablet Take 5 mg by mouth daily as needed for moderate constipation.  . Cholecalciferol (VITAMIN D) 2000 UNITS tablet Take 2,000 Units by mouth daily.  Marland Kitchen diltiazem (DILACOR XR) 240 MG 24 hr capsule Take 1 capsule (240 mg total) by mouth daily.  . fluticasone (FLONASE) 50 MCG/ACT nasal spray USE TWO SPRAY(S) IN EACH NOSTRIL ONCE DAILY  . folic acid (FOLVITE) 1 MG tablet Take 1 mg by mouth 2 (two) times daily.   . InFLIXimab (REMICADE IV) Inject into the vein every 6 (six) weeks. Per Dr Jefm Bryant  . losartan (COZAAR) 100 MG tablet Take 1 tablet (100 mg total) by mouth daily.  Marland Kitchen lovastatin (MEVACOR) 40 MG tablet Take 1 tablet (40  mg total) by mouth daily.  . magnesium oxide (MAG-OX) 400 (241.3 MG) MG tablet Take 400 mg by mouth daily.  . methotrexate 25 MG/ML SOLN Inject 17.5 mg into the skin once a week. Patient takes 17.5 mg (0.7 ml) on Monday or Tuesday of each week.  Marland Kitchen omeprazole (PRILOSEC) 20 MG capsule TAKE 1 CAPSULE DAILY  . ondansetron (ZOFRAN ODT) 4 MG disintegrating tablet Allow 1-2 tablets to dissolve in your mouth every 8 hours as needed for nausea/vomiting  . penciclovir (DENAVIR) 1 % cream Apply 1 application topically every 2 (two) hours. For Fever Blister (Patient taking differently: Apply 1 application topically every 2 (two) hours as needed (fever blister). For Fever Blister)  . polyethylene glycol (MIRALAX / GLYCOLAX) packet Take 17 g by mouth daily.  . potassium chloride 20 MEQ TBCR Take 10 mEq by mouth daily.  . traMADol (ULTRAM) 50 MG tablet Take 1-2  tablets by mouth every 6 hours as needed for moderate to severe pain  . triamterene-hydrochlorothiazide (MAXZIDE-25) 37.5-25 MG tablet Take 1 tablet by mouth daily. (Patient taking differently: Take 0.5 tablets by mouth daily. )   No facility-administered encounter medications on file as of 05/02/2016.     Review of Systems  Constitutional: Negative for appetite change and fever.  HENT: Negative for congestion and sinus pressure.   Respiratory: Negative for cough, chest tightness and shortness of breath.   Cardiovascular: Negative for chest pain, palpitations and leg swelling.  Gastrointestinal: Negative for abdominal pain, diarrhea, nausea and vomiting.  Genitourinary: Negative for difficulty urinating and dysuria.  Musculoskeletal:       Back pain.  Hip pain better.  Back on remicade now.   Skin: Negative for color change and rash.  Neurological: Negative for dizziness, light-headedness and headaches.  Psychiatric/Behavioral: Negative for agitation and dysphoric mood.       Objective:    Physical Exam  Constitutional: She appears well-developed and well-nourished. No distress.  HENT:  Nose: Nose normal.  Mouth/Throat: Oropharynx is clear and moist.  Neck: Neck supple. No thyromegaly present.  Cardiovascular: Normal rate and regular rhythm.   Pulmonary/Chest: Breath sounds normal. No respiratory distress. She has no wheezes.  Abdominal: Soft. Bowel sounds are normal. There is no tenderness.  Musculoskeletal: She exhibits no edema or tenderness.  Lymphadenopathy:    She has no cervical adenopathy.  Skin: No rash noted. No erythema.  Psychiatric: She has a normal mood and affect. Her behavior is normal.    BP (!) 144/68   Pulse 73   Temp 98.1 F (36.7 C) (Oral)   Ht 5\' 1"  (1.549 m)   Wt 110 lb 6.4 oz (50.1 kg)   SpO2 98%   BMI 20.86 kg/m  Wt Readings from Last 3 Encounters:  05/02/16 110 lb 6.4 oz (50.1 kg)  02/26/16 112 lb (50.8 kg)  01/02/16 112 lb (50.8 kg)      Lab Results  Component Value Date   WBC 6.7 01/16/2016   HGB 11.2 (L) 01/16/2016   HCT 33.1 (L) 01/16/2016   PLT 354.0 01/16/2016   GLUCOSE 132 (H) 01/02/2016   CHOL 163 04/18/2015   TRIG 166.0 (H) 04/18/2015   HDL 69.70 04/18/2015   LDLCALC 60 04/18/2015   ALT 35 12/03/2015   AST 30 12/03/2015   NA 132 (L) 01/02/2016   K 3.5 01/02/2016   CL 97 01/02/2016   CREATININE 1.04 01/02/2016   BUN 26 (H) 01/02/2016   CO2 29 01/02/2016   TSH 1.05 04/18/2015  Mr 3d Recon At Scanner  Result Date: 03/05/2016 CLINICAL DATA:  Evaluate cyst of pancreas. EXAM: MRI ABDOMEN WITHOUT AND WITH CONTRAST (INCLUDING MRCP) TECHNIQUE: Multiplanar multisequence MR imaging of the abdomen was performed both before and after the administration of intravenous contrast. Heavily T2-weighted images of the biliary and pancreatic ducts were obtained, and three-dimensional MRCP images were rendered by post processing. CONTRAST:  46mL MULTIHANCE GADOBENATE DIMEGLUMINE 529 MG/ML IV SOLN COMPARISON:  CT from 12/03/2015 and MRI from 1/24/7 FINDINGS: Lower chest: The lung bases are clear. Hepatobiliary: No suspicious liver abnormality. The gallbladder appears normal. No biliary dilatation identified. Pancreas: No evidence for pancreatic inflammation. No solid enhancing mass identified. There are numerous unilocular cystic structures are identified throughout the pancreas. The largest arises from the body of pancreas measuring 2.7 cm, image 15 of series 3. This is increased from 7/30 1/7 T when it measured 2.3 cm. On study from 2007 this measured 11 mm. In the uncinate process of the pancreas there are several cystic structures which measure up to 1.2 cm, image 19 of series 3. On the previous exam this measured 1.1 cm. Along the tail of pancreas there is a 9 mm cystic structure, image 14 of series 3. Unchanged from previous exam. No enhancement associated with these cystic structures. The pancreatic duct appears mildly  ectatic. Measuring 3 mm, image 16 of series 3. Spleen:  Within normal limits in size and appearance. Adrenals/Urinary Tract: There is a right kidney AML measuring 8 mm. 9 mm cyst arises from the upper pole the right kidney, image number 12 of series 3. There is no mass or hydronephrosis identified. Stomach/Bowel: Visualized portions within the abdomen are unremarkable. Vascular/Lymphatic: No pathologically enlarged lymph nodes identified. No abdominal aortic aneurysm demonstrated. Other:  None. Musculoskeletal: No suspicious bone lesions identified. IMPRESSION: 1. Numerous T2 hyperintense structures are identified. These a been present dating back to 03/05/2016 but exhibited mal gradual increase in size over time. Etiology remains indeterminate but favored to represent a benign cystic lesions. For lesions between 2 and 3 cm Annual surveillance with contrast enhanced pancreas protocol MRI is recommended. 2. Right kidney AML and right kidney cysts. Electronically Signed   By: Kerby Moors M.D.   On: 03/05/2016 12:04   Mr Jeananne Rama W/wo Cm/mrcp  Result Date: 03/05/2016 CLINICAL DATA:  Evaluate cyst of pancreas. EXAM: MRI ABDOMEN WITHOUT AND WITH CONTRAST (INCLUDING MRCP) TECHNIQUE: Multiplanar multisequence MR imaging of the abdomen was performed both before and after the administration of intravenous contrast. Heavily T2-weighted images of the biliary and pancreatic ducts were obtained, and three-dimensional MRCP images were rendered by post processing. CONTRAST:  47mL MULTIHANCE GADOBENATE DIMEGLUMINE 529 MG/ML IV SOLN COMPARISON:  CT from 12/03/2015 and MRI from 1/24/7 FINDINGS: Lower chest: The lung bases are clear. Hepatobiliary: No suspicious liver abnormality. The gallbladder appears normal. No biliary dilatation identified. Pancreas: No evidence for pancreatic inflammation. No solid enhancing mass identified. There are numerous unilocular cystic structures are identified throughout the pancreas. The largest  arises from the body of pancreas measuring 2.7 cm, image 15 of series 3. This is increased from 7/30 1/7 T when it measured 2.3 cm. On study from 2007 this measured 11 mm. In the uncinate process of the pancreas there are several cystic structures which measure up to 1.2 cm, image 19 of series 3. On the previous exam this measured 1.1 cm. Along the tail of pancreas there is a 9 mm cystic structure, image 14 of series 3. Unchanged from previous exam.  No enhancement associated with these cystic structures. The pancreatic duct appears mildly ectatic. Measuring 3 mm, image 16 of series 3. Spleen:  Within normal limits in size and appearance. Adrenals/Urinary Tract: There is a right kidney AML measuring 8 mm. 9 mm cyst arises from the upper pole the right kidney, image number 12 of series 3. There is no mass or hydronephrosis identified. Stomach/Bowel: Visualized portions within the abdomen are unremarkable. Vascular/Lymphatic: No pathologically enlarged lymph nodes identified. No abdominal aortic aneurysm demonstrated. Other:  None. Musculoskeletal: No suspicious bone lesions identified. IMPRESSION: 1. Numerous T2 hyperintense structures are identified. These a been present dating back to 03/05/2016 but exhibited mal gradual increase in size over time. Etiology remains indeterminate but favored to represent a benign cystic lesions. For lesions between 2 and 3 cm Annual surveillance with contrast enhanced pancreas protocol MRI is recommended. 2. Right kidney AML and right kidney cysts. Electronically Signed   By: Kerby Moors M.D.   On: 03/05/2016 12:04       Assessment & Plan:   Problem List Items Addressed This Visit    Abnormal liver function tests    Follow liver panel.       Anemia    Last hgb checked 12.3 - through rheumatology.        GERD (gastroesophageal reflux disease)    Upper symptoms controlled on omeprazole.        Hypercholesterolemia    Low cholesterol diet and exercise.  On  lovastatin.  Follow lipid panel and liver function tests.        Hypertension    Blood pressure slightly elevated on today's initial check.  Continue current medication regimen.  Follow pressures.  Follow metabolic panel.        Inflammatory arthritis ([place])    Back on remicade.  Followed by Dr Jefm Bryant.        Loss of weight    She is eating better.  Appetite is better.  Bowels better.  Being followed by GI.       Stress    Increased stress as outlined.  Overall feels she is handling things relatively well.  Does not feel needs any further intervention.  Follow.        Other Visit Diagnoses    Need for prophylactic vaccination against Streptococcus pneumoniae (pneumococcus)    -  Primary   Relevant Orders   Pneumococcal polysaccharide vaccine 23-valent greater than or equal to 2yo subcutaneous/IM (Completed)       Einar Pheasant, MD

## 2016-05-02 NOTE — Progress Notes (Signed)
Pre visit review using our clinic review tool, if applicable. No additional management support is needed unless otherwise documented below in the visit note. 

## 2016-05-05 ENCOUNTER — Encounter: Payer: Self-pay | Admitting: Internal Medicine

## 2016-05-05 NOTE — Assessment & Plan Note (Signed)
She is eating better.  Appetite is better.  Bowels better.  Being followed by GI.

## 2016-05-05 NOTE — Assessment & Plan Note (Signed)
Last hgb checked 12.3 - through rheumatology.

## 2016-05-05 NOTE — Assessment & Plan Note (Signed)
Increased stress as outlined.  Overall feels she is handling things relatively well.  Does not feel needs any further intervention.  Follow.

## 2016-05-05 NOTE — Assessment & Plan Note (Signed)
Follow liver panel.  

## 2016-05-05 NOTE — Assessment & Plan Note (Signed)
Blood pressure slightly elevated on today's initial check.  Continue current medication regimen.  Follow pressures.  Follow metabolic panel.

## 2016-05-05 NOTE — Assessment & Plan Note (Signed)
Low cholesterol diet and exercise.  On lovastatin.  Follow lipid panel and liver function tests.   

## 2016-05-05 NOTE — Assessment & Plan Note (Signed)
Upper symptoms controlled on omeprazole.  

## 2016-05-05 NOTE — Assessment & Plan Note (Signed)
Back on remicade.  Followed by Dr Jefm Bryant.

## 2016-05-15 DIAGNOSIS — M461 Sacroiliitis, not elsewhere classified: Secondary | ICD-10-CM | POA: Diagnosis not present

## 2016-06-02 ENCOUNTER — Telehealth: Payer: Self-pay | Admitting: Internal Medicine

## 2016-06-02 ENCOUNTER — Encounter: Payer: Self-pay | Admitting: Family

## 2016-06-02 ENCOUNTER — Ambulatory Visit (INDEPENDENT_AMBULATORY_CARE_PROVIDER_SITE_OTHER): Payer: Medicare Other | Admitting: Family

## 2016-06-02 VITALS — BP 152/58 | HR 67 | Temp 97.8°F | Ht 61.0 in | Wt 112.4 lb

## 2016-06-02 DIAGNOSIS — R350 Frequency of micturition: Secondary | ICD-10-CM

## 2016-06-02 DIAGNOSIS — B379 Candidiasis, unspecified: Secondary | ICD-10-CM | POA: Diagnosis not present

## 2016-06-02 DIAGNOSIS — T3695XA Adverse effect of unspecified systemic antibiotic, initial encounter: Secondary | ICD-10-CM

## 2016-06-02 LAB — POCT URINALYSIS DIPSTICK
BILIRUBIN UA: NEGATIVE
GLUCOSE UA: NEGATIVE
Ketones, UA: NEGATIVE
NITRITE UA: NEGATIVE
Protein, UA: NEGATIVE
Spec Grav, UA: 1.01
Urobilinogen, UA: 0.2
pH, UA: 7.5

## 2016-06-02 MED ORDER — FLUCONAZOLE 150 MG PO TABS: 150.0000 mg | ORAL_TABLET | Freq: Once | ORAL | 1 refills | Status: AC

## 2016-06-02 MED ORDER — CIPROFLOXACIN HCL 250 MG PO TABS: 250.0000 mg | ORAL_TABLET | Freq: Two times a day (BID) | ORAL | 0 refills | Status: DC

## 2016-06-02 NOTE — Telephone Encounter (Signed)
Appointment scheduled with Mable Paris for evaluation

## 2016-06-02 NOTE — Telephone Encounter (Signed)
Pt called and stated that she has a UTI. She is c/o pressure and frequent urination and pain. Pt has a specimen, she does not want to come in for an appt because she does want to get the flu. Also stated that if Dr. Nicki Reaper calls something in she will also need something for a yeast infection. Please advise, thank you!  Call pt @ [phone]

## 2016-06-02 NOTE — Patient Instructions (Addendum)
[place]  Hold lovastatin on the day(s) when you take the diflucan  Drink plenty of water and take antibiotic as prescribed.   We are pending the urine culture to know the organism causing the infection and our office will call you with results. If the particular organism requires a different antibiotic than the on prescribed, we will place an order for a new prescription at that time.   If you symptoms worsen or you have new symptoms, please contact our office, or return to clinic for re evaluation.  Urinary Tract Infection Urinary tract infections (UTIs) can develop anywhere along your urinary tract. Your urinary tract is your body's drainage system for removing wastes and extra water. Your urinary tract includes two kidneys, two ureters, a bladder, and a urethra. Your kidneys are a pair of bean-shaped organs. Each kidney is about the size of your fist. They are located below your ribs, one on each side of your spine. CAUSES Infections are caused by microbes, which are microscopic organisms, including fungi, viruses, and bacteria. These organisms are so small that they can only be seen through a microscope. Bacteria are the microbes that most commonly cause UTIs. SYMPTOMS  Symptoms of UTIs may vary by age and gender of the patient and by the location of the infection. Symptoms in young women typically include a frequent and intense urge to urinate and a painful, burning feeling in the bladder or urethra during urination. Older women and men are more likely to be tired, shaky, and weak and have muscle aches and abdominal pain. A fever may mean the infection is in your kidneys. Other symptoms of a kidney infection include pain in your back or sides below the ribs, nausea, and vomiting. DIAGNOSIS To diagnose a UTI, your caregiver will ask you about your symptoms. Your caregiver will also ask you to provide a urine sample. The urine sample will be tested for bacteria and white blood cells. White blood  cells are made by your body to help fight infection. TREATMENT  Typically, UTIs can be treated with medication. Because most UTIs are caused by a bacterial infection, they usually can be treated with the use of antibiotics. The choice of antibiotic and length of treatment depend on your symptoms and the type of bacteria causing your infection. HOME CARE INSTRUCTIONS  If you were prescribed antibiotics, take them exactly as your caregiver instructs you. Finish the medication even if you feel better after you have only taken some of the medication.  Drink enough water and fluids to keep your urine clear or pale yellow.  Avoid caffeine, tea, and carbonated beverages. They tend to irritate your bladder.  Empty your bladder often. Avoid holding urine for long periods of time.  Empty your bladder before and after sexual intercourse.  After a bowel movement, women should cleanse from front to back. Use each tissue only once. SEEK MEDICAL CARE IF:   You have back pain.  You develop a fever.  Your symptoms do not begin to resolve within 3 days. SEEK IMMEDIATE MEDICAL CARE IF:   You have severe back pain or lower abdominal pain.  You develop chills.  You have nausea or vomiting.  You have continued burning or discomfort with urination. MAKE SURE YOU:   Understand these instructions.  Will watch your condition.  Will get help right away if you are not doing well or get worse.   This information is not intended to replace advice given to you by your health care provider.  Make sure you discuss any questions you have with your health care provider.   Document Released: 01/29/2005 Document Revised: 01/10/2015 Document Reviewed: 05/30/2011 Elsevier Interactive Patient Education [employer].

## 2016-06-02 NOTE — Progress Notes (Signed)
Pre visit review using our clinic review tool, if applicable. No additional management support is needed unless otherwise documented below in the visit note. 

## 2016-06-02 NOTE — Progress Notes (Signed)
Subjective:    Patient ID: Stacey Snyder, female    DOB: [birth year], 83 y.o.   MRN: [id number]  CC: Stacey Snyder is a 83 y.o. female who presents today for an acute visit.    HPI: CC: urinary frequency x one day. Endorses dysuria, chills. No fever, flank pain. Hasnt tried any medications.  No recent UTI. No N, V, flank pain.   H/o urinary sepsis, h/o renal stones, diverticulitis.     HISTORY:  Past Medical History:  Diagnosis Date  . Diverticulosis   . Fibrocystic breast disease   . GERD (gastroesophageal reflux disease)   . Hypercholesterolemia   . Hypertension   . Hypertension   . IBS (irritable bowel syndrome)   . Inflammatory arthritis    positive anti-CCP abs, s/p prednisone, MTX, Remicade  . Nephrolithiasis   . Osteopenia    GI upset with Fosamax  . Pancreatitis 2007   s/p ERCP   Past Surgical History:  Procedure Laterality Date  . ABDOMINAL HYSTERECTOMY  1993   ovaries not removed  . BREAST BIOPSY Left 1984   negative  . BREAST CYST EXCISION Left 2010   negative  . LITHOTRIPSY    . LUMBAR LAMINECTOMY  1983  . TRIGGER FINGER RELEASE     right ring finger   Family History  Problem Relation Age of Onset  . Ovarian cancer Mother   . Renal Disease Mother   . Heart disease Mother   . Diabetes Mother   . Hypertension Mother     Allergies: Codeine; Flagyl [metronidazole]; Skelaxin [metaxalone]; and Tenormin [atenolol] Current Outpatient Prescriptions on File Prior to Visit  Medication Sig Dispense Refill  . acyclovir ointment (ZOVIRAX) 5 % Apply 1 application topically 4 (four) times daily as needed (fever blister). Apply to lip 15 g 0  . bisacodyl (DULCOLAX) 5 MG EC tablet Take 5 mg by mouth daily as needed for moderate constipation.    . Cholecalciferol (VITAMIN D) 2000 UNITS tablet Take 2,000 Units by mouth daily.    Marland Kitchen diltiazem (DILACOR XR) 240 MG 24 hr capsule Take 1 capsule (240 mg total) by mouth daily. 90 capsule 1  .  fluticasone (FLONASE) 50 MCG/ACT nasal spray USE TWO SPRAY(S) IN EACH NOSTRIL ONCE DAILY 16 g 11  . folic acid (FOLVITE) 1 MG tablet Take 1 mg by mouth 2 (two) times daily.     . InFLIXimab (REMICADE IV) Inject into the vein every 6 (six) weeks. Per Dr Stacey Snyder    . losartan (COZAAR) 100 MG tablet Take 1 tablet (100 mg total) by mouth daily. 90 tablet 3  . lovastatin (MEVACOR) 40 MG tablet Take 1 tablet (40 mg total) by mouth daily. 90 tablet 3  . magnesium oxide (MAG-OX) 400 (241.3 MG) MG tablet Take 400 mg by mouth daily.    . methotrexate 25 MG/ML SOLN Inject 17.5 mg into the skin once a week. Patient takes 17.5 mg (0.7 ml) on Monday or Tuesday of each week.    Marland Kitchen omeprazole (PRILOSEC) 20 MG capsule TAKE 1 CAPSULE DAILY 90 capsule 1  . ondansetron (ZOFRAN ODT) 4 MG disintegrating tablet Allow 1-2 tablets to dissolve in your mouth every 8 hours as needed for nausea/vomiting 30 tablet 0  . penciclovir (DENAVIR) 1 % cream Apply 1 application topically every 2 (two) hours. For Fever Blister (Patient taking differently: Apply 1 application topically every 2 (two) hours as needed (fever blister). For Fever Blister) 1.5 g 0  . polyethylene glycol (MIRALAX /  GLYCOLAX) packet Take 17 g by mouth daily.    . potassium chloride 20 MEQ TBCR Take 10 mEq by mouth daily. 5 tablet 0  . traMADol (ULTRAM) 50 MG tablet Take 1-2 tablets by mouth every 6 hours as needed for moderate to severe pain 20 tablet 0  . triamterene-hydrochlorothiazide (MAXZIDE-25) 37.5-25 MG tablet Take 1 tablet by mouth daily. (Patient taking differently: Take 0.5 tablets by mouth daily. ) 90 tablet 3   No current facility-administered medications on file prior to visit.     Social History  Substance Use Topics  . Smoking status: Never Smoker  . Smokeless tobacco: Never Used  . Alcohol use No    Review of Systems  Constitutional: Negative for chills and fever.  Respiratory: Negative for cough.   Cardiovascular: Negative for chest  pain and palpitations.  Gastrointestinal: Negative for abdominal distention, abdominal pain, nausea and vomiting.  Genitourinary: Positive for dysuria. Negative for flank pain and hematuria.      Objective:    BP (!) 152/58   Pulse 67   Temp 97.8 F (36.6 C) (Oral)   Ht 5\' 1"  (1.549 m)   Wt 112 lb 6.4 oz (51 kg)   SpO2 98%   BMI 21.24 kg/m    Physical Exam  Constitutional: She appears well-developed and well-nourished.  Cardiovascular: Normal rate, regular rhythm, normal heart sounds and normal pulses.   Pulmonary/Chest: Effort normal and breath sounds normal. She has no wheezes. She has no rhonchi. She has no rales.  Abdominal: There is no CVA tenderness.  Neurological: She is alert.  Skin: Skin is warm and dry.  Psychiatric: She has a normal mood and affect. Her speech is normal and behavior is normal. Thought content normal.  Vitals reviewed.      Assessment & Plan:   1. Urinary frequency Afebrile. No CVA tenderness. UA shows trace leukocytes, blood. Due to patient preference, we decided to empirically treat. Pending culture. Limited on drug-drug interactions due to methotrexate. She has had ciprofloxacin in the past and tolerated well; we agreed to start this agent.  - POCT Urinalysis Dipstick - Urine Culture - ciprofloxacin (CIPRO) 250 MG tablet; Take 1 tablet (250 mg total) by mouth 2 (two) times daily.  Dispense: 6 tablet; Refill: 0  2. Antibiotic-induced yeast infection Per request of patient. - fluconazole (DIFLUCAN) 150 MG tablet; Take 1 tablet (150 mg total) by mouth once. Take one tablet PO once. If continue to have symptoms, may take one tablet PO 3 days later.  Dispense: 2 tablet; Refill: 1    I am having Stacey Snyder start on ciprofloxacin and fluconazole. I am also having her maintain her folic acid, methotrexate, InFLIXimab (REMICADE IV), Vitamin D, magnesium oxide, penciclovir, lovastatin, triamterene-hydrochlorothiazide, losartan, acyclovir ointment,  Potassium Chloride ER, fluticasone, diltiazem, ondansetron, traMADol, bisacodyl, polyethylene glycol, and omeprazole.   Meds ordered this encounter  Medications  . ciprofloxacin (CIPRO) 250 MG tablet    Sig: Take 1 tablet (250 mg total) by mouth 2 (two) times daily.    Dispense:  6 tablet    Refill:  0    Order Specific Question:   Supervising Provider    Answer:   Derrel Nip, TERESA L [[id number]]  . fluconazole (DIFLUCAN) 150 MG tablet    Sig: Take 1 tablet (150 mg total) by mouth once. Take one tablet PO once. If continue to have symptoms, may take one tablet PO 3 days later.    Dispense:  2 tablet    Refill:  1    Order Specific Question:   Supervising Provider    Answer:   Crecencio Mc [[id number]]    Return precautions given.   Risks, benefits, and alternatives of the medications and treatment plan prescribed today were discussed, and patient expressed understanding.   Education regarding symptom management and diagnosis given to patient on AVS.  Continue to follow with Einar Pheasant, MD for routine health maintenance.   Stacey Snyder and I agreed with plan.   Mable Paris, FNP

## 2016-06-04 LAB — URINE CULTURE

## 2016-06-13 ENCOUNTER — Telehealth: Payer: Self-pay | Admitting: Internal Medicine

## 2016-06-16 DIAGNOSIS — M0579 Rheumatoid arthritis with rheumatoid factor of multiple sites without organ or systems involvement: Secondary | ICD-10-CM | POA: Diagnosis not present

## 2016-06-17 NOTE — Telephone Encounter (Signed)
Pt called stating she needs a refill for lovastatin (MEVACOR) 40 MG tablet, triamterene-hydrochlorothiazide (MAXZIDE-25) 37.5-25 MG tablet and losartan (COZAAR) 100 MG tablet.   [facility] [place], [place] to [facility] [employer] [facility]  Call pt @ [phone]. Thank you!

## 2016-06-19 NOTE — Telephone Encounter (Signed)
Done

## 2016-06-24 ENCOUNTER — Telehealth: Payer: Self-pay | Admitting: Internal Medicine

## 2016-06-24 NOTE — Telephone Encounter (Signed)
[facility] stating that [facility] needs a refill for lovastatin (MEVACOR) 40 MG tablet and triamterene-hydrochlorothiazide (MAXZIDE-25) 37.5-25 MG tablet 90day supply.  [facility] [place], [place] to [facility] [employer] [facility] Thank you!

## 2016-06-24 NOTE — Telephone Encounter (Signed)
Refill sent on 06-06-16

## 2016-06-26 ENCOUNTER — Telehealth: Payer: Self-pay | Admitting: Internal Medicine

## 2016-06-26 NOTE — Telephone Encounter (Signed)
If she is going to continue to see Dr Jefm Bryant, I would prefer him to continue to refill the medication.  If she is no longer seeing him, then I will refill.

## 2016-06-26 NOTE — Telephone Encounter (Signed)
Stacey Snyder [phone] from Dr Leanor Kail regarding pt needing a refill for traMADol (ULTRAM) 50 MG tablet. They want to know if Dr Nicki Reaper will take over prescribing that medication?  Thank you!

## 2016-06-26 NOTE — Telephone Encounter (Signed)
Please see Dr. Nicki Reaper response. And let us know

## 2016-06-26 NOTE — Telephone Encounter (Signed)
pls advise if you would be willing to continue for patient.

## 2016-06-27 DIAGNOSIS — G8929 Other chronic pain: Secondary | ICD-10-CM | POA: Diagnosis not present

## 2016-06-27 DIAGNOSIS — M5441 Lumbago with sciatica, right side: Secondary | ICD-10-CM | POA: Diagnosis not present

## 2016-07-08 ENCOUNTER — Other Ambulatory Visit: Payer: Self-pay | Admitting: Neurological Surgery

## 2016-07-08 DIAGNOSIS — M5441 Lumbago with sciatica, right side: Principal | ICD-10-CM

## 2016-07-08 DIAGNOSIS — G8929 Other chronic pain: Secondary | ICD-10-CM | POA: Diagnosis not present

## 2016-07-22 ENCOUNTER — Ambulatory Visit
Admission: RE | Admit: 2016-07-22 | Discharge: 2016-07-22 | Disposition: A | Discharge status: Home / Self Care | Payer: Medicare Other | Source: Ambulatory Visit | Attending: Neurological Surgery | Admitting: Neurological Surgery

## 2016-07-22 DIAGNOSIS — M48061 Spinal stenosis, lumbar region without neurogenic claudication: Secondary | ICD-10-CM | POA: Diagnosis not present

## 2016-07-22 DIAGNOSIS — M545 Low back pain: Secondary | ICD-10-CM | POA: Diagnosis not present

## 2016-07-22 DIAGNOSIS — M5126 Other intervertebral disc displacement, lumbar region: Secondary | ICD-10-CM | POA: Insufficient documentation

## 2016-07-22 DIAGNOSIS — M5441 Lumbago with sciatica, right side: Secondary | ICD-10-CM | POA: Diagnosis not present

## 2016-07-22 DIAGNOSIS — G8929 Other chronic pain: Secondary | ICD-10-CM | POA: Diagnosis not present

## 2016-08-05 ENCOUNTER — Other Ambulatory Visit: Payer: Self-pay | Admitting: Neurological Surgery

## 2016-08-05 ENCOUNTER — Telehealth: Payer: Self-pay | Admitting: *Deleted

## 2016-08-05 DIAGNOSIS — M79601 Pain in right arm: Secondary | ICD-10-CM | POA: Diagnosis not present

## 2016-08-05 DIAGNOSIS — M5416 Radiculopathy, lumbar region: Secondary | ICD-10-CM | POA: Diagnosis not present

## 2016-08-05 DIAGNOSIS — M48061 Spinal stenosis, lumbar region without neurogenic claudication: Secondary | ICD-10-CM | POA: Diagnosis not present

## 2016-08-07 ENCOUNTER — Ambulatory Visit
Admission: RE | Admit: 2016-08-07 | Discharge: 2016-08-07 | Disposition: A | Discharge status: Home / Self Care | Payer: Medicare Other | Source: Ambulatory Visit | Attending: Neurological Surgery | Admitting: Neurological Surgery

## 2016-08-07 ENCOUNTER — Encounter
Admission: RE | Admit: 2016-08-07 | Discharge: 2016-08-07 | Disposition: A | Discharge status: Home / Self Care | Payer: Medicare Other | Source: Ambulatory Visit | Attending: Neurological Surgery | Admitting: Neurological Surgery

## 2016-08-07 DIAGNOSIS — Z01818 Encounter for other preprocedural examination: Secondary | ICD-10-CM

## 2016-08-07 DIAGNOSIS — M5416 Radiculopathy, lumbar region: Secondary | ICD-10-CM | POA: Diagnosis not present

## 2016-08-07 DIAGNOSIS — I700 Atherosclerosis of aorta: Secondary | ICD-10-CM | POA: Insufficient documentation

## 2016-08-07 DIAGNOSIS — Z01810 Encounter for preprocedural cardiovascular examination: Secondary | ICD-10-CM | POA: Insufficient documentation

## 2016-08-07 DIAGNOSIS — I498 Other specified cardiac arrhythmias: Secondary | ICD-10-CM | POA: Insufficient documentation

## 2016-08-07 DIAGNOSIS — I10 Essential (primary) hypertension: Secondary | ICD-10-CM | POA: Diagnosis not present

## 2016-08-07 DIAGNOSIS — Z01812 Encounter for preprocedural laboratory examination: Secondary | ICD-10-CM | POA: Insufficient documentation

## 2016-08-07 HISTORY — DX: Nausea with vomiting, unspecified: R11.2

## 2016-08-07 HISTORY — DX: Spinal stenosis, lumbosacral region: M48.07

## 2016-08-07 HISTORY — DX: Other specified postprocedural states: Z98.890

## 2016-08-07 HISTORY — DX: Personal history of urinary calculi: Z87.442

## 2016-08-07 LAB — URINALYSIS, ROUTINE W REFLEX MICROSCOPIC
BILIRUBIN URINE: NEGATIVE
GLUCOSE, UA: NEGATIVE mg/dL
HGB URINE DIPSTICK: NEGATIVE
Ketones, ur: NEGATIVE mg/dL
Leukocytes, UA: NEGATIVE
Nitrite: NEGATIVE
PH: 8 (ref 5.0–8.0)
Protein, ur: NEGATIVE mg/dL
SPECIFIC GRAVITY, URINE: 1.012 (ref 1.005–1.030)

## 2016-08-07 LAB — BASIC METABOLIC PANEL
ANION GAP: 7 (ref 5–15)
BUN: 38 mg/dL — AB (ref 6–20)
CALCIUM: 9.6 mg/dL (ref 8.9–10.3)
CO2: 30 mmol/L (ref 22–32)
Chloride: 102 mmol/L (ref 101–111)
Creatinine, Ser: 1.07 mg/dL — ABNORMAL HIGH (ref 0.44–1.00)
GFR calc Af Amer: 55 mL/min — ABNORMAL LOW (ref >60)
GFR calc non Af Amer: 47 mL/min — ABNORMAL LOW (ref >60)
GLUCOSE: 97 mg/dL (ref 65–99)
Potassium: 3.7 mmol/L (ref 3.5–5.1)
Sodium: 139 mmol/L (ref 135–145)

## 2016-08-07 LAB — CBC
HEMATOCRIT: 34.8 % — AB (ref 35.0–47.0)
Hemoglobin: 11.7 g/dL — ABNORMAL LOW (ref 12.0–16.0)
MCH: 32.4 pg (ref 26.0–34.0)
MCHC: 33.7 g/dL (ref 32.0–36.0)
MCV: 96 fL (ref 80.0–100.0)
PLATELETS: 277 10*3/uL (ref 150–440)
RBC: 3.62 MIL/uL — ABNORMAL LOW (ref 3.80–5.20)
RDW: 13.2 % (ref 11.5–14.5)
WBC: 6.2 10*3/uL (ref 3.6–11.0)

## 2016-08-07 LAB — SURGICAL PCR SCREEN
MRSA, PCR: NEGATIVE
Staphylococcus aureus: NEGATIVE

## 2016-08-07 LAB — PROTIME-INR
INR: 0.92
Prothrombin Time: 12.4 seconds (ref 11.4–15.2)

## 2016-08-07 LAB — APTT: APTT: 28 s (ref 24–36)

## 2016-08-07 NOTE — Patient Instructions (Signed)
Your procedure is scheduled on: Monday 08/18/16 Report to [facility]. 2ND FLOOR MEDICAL MALL ENTRANCE. To find out your arrival time please call [phone] between 1PM - 3PM on Friday 08/15/16.  Remember: Instructions that are not followed completely may result in serious medical risk, up to and including death, or upon the discretion of your surgeon and anesthesiologist your surgery may need to be rescheduled.    __X__ 1. Do not eat food or drink liquids after midnight. No gum chewing or hard candies.     __X__ 2. No Alcohol for 24 hours before or after surgery.   ____ 3. Bring all medications with you on the day of surgery if instructed.    __X__ 4. Notify your doctor if there is any change in your medical condition     (cold, fever, infections).             __X___5. No smoking within 24 hours of your surgery.     Do not wear jewelry, make-up, hairpins, clips or nail polish.  Do not wear lotions, powders, or perfumes.   Do not shave 48 hours prior to surgery. Men may shave face and neck.  Do not bring valuables to the hospital.    [facility] is not responsible for any belongings or valuables.               Contacts, dentures or bridgework may not be worn into surgery.  Leave your suitcase in the car. After surgery it may be brought to your room.  For patients admitted to the hospital, discharge time is determined by your                treatment team.   Patients discharged the day of surgery will not be allowed to drive home.   Please read over the following fact sheets that you were given:   Pain Booklet and MRSA Information   __X__ Take these medicines the morning of surgery with A SIP OF WATER:    1. DILTIAZEM  2. LOSARTAN  3. LOVASTATIN  4. OMEPRAZOLE  5.  6.  ____ Fleet Enema (as directed)   __X__ Use CHG Soap as directed  ____ Use inhalers on the day of surgery  ____ Stop metformin 2 days prior to surgery    ____ Take 1/2 of usual insulin dose the night  before surgery and none on the morning of surgery.   ____ Stop Coumadin/Plavix/aspirin on   __X__ Stop Anti-inflammatories such as Advil, Aleve, Ibuprofen, Motrin, Naproxen, Naprosyn, Goodies,powder, or aspirin products.  OK to take Tylenol.   ____ Stop supplements until after surgery.    ____ Bring C-Pap to the hospital.

## 2016-08-08 ENCOUNTER — Encounter: Payer: Self-pay | Admitting: *Deleted

## 2016-08-08 ENCOUNTER — Inpatient Hospital Stay: Admission: RE | Admit: 2016-08-08 | Payer: Medicare Other | Source: Ambulatory Visit

## 2016-08-08 ENCOUNTER — Ambulatory Visit
Admission: RE | Admit: 2016-08-08 | Discharge: 2016-08-08 | Disposition: A | Discharge status: Home / Self Care | Payer: Medicare Other | Source: Ambulatory Visit | Attending: Neurological Surgery | Admitting: Neurological Surgery

## 2016-08-08 DIAGNOSIS — M79601 Pain in right arm: Secondary | ICD-10-CM | POA: Diagnosis not present

## 2016-08-08 DIAGNOSIS — M50222 Other cervical disc displacement at C5-C6 level: Secondary | ICD-10-CM | POA: Insufficient documentation

## 2016-08-08 DIAGNOSIS — M50221 Other cervical disc displacement at C4-C5 level: Secondary | ICD-10-CM | POA: Diagnosis not present

## 2016-08-08 NOTE — Pre-Procedure Instructions (Signed)
CBC, Met B, and CXR sent to Dr. Aris Lot and Anesthesia for review.

## 2016-08-13 ENCOUNTER — Telehealth: Payer: Self-pay | Admitting: Internal Medicine

## 2016-08-13 MED ORDER — TRIAMTERENE-HCTZ 37.5-25 MG PO TABS: 1.0000 | ORAL_TABLET | Freq: Every day | ORAL | 0 refills | Status: DC

## 2016-08-13 MED ORDER — DILTIAZEM HCL ER 240 MG PO CP24: 240.0000 mg | ORAL_CAPSULE | Freq: Every day | ORAL | 0 refills | Status: DC

## 2016-08-13 NOTE — Telephone Encounter (Signed)
Pt called needing a refill for medication of lovastatin (MEVACOR) 40 MG tablet,omeprazole (PRILOSEC) 20 MG capsule,losartan (COZAAR) 100 MG tablet, diltiazem (DILACOR XR) 240 MG 24 hr capsule, triamterene-hydrochlorothiazide (MAXZIDE-25) 37.5-25 MG tablet. 90 day supply  [facility] [place], [place] to [facility] [employer] [facility]  Call pt @ [phone]. Thank you!

## 2016-08-13 NOTE — Telephone Encounter (Signed)
Patient should have refills on medications other than the two that I have gave one refill for

## 2016-08-18 ENCOUNTER — Ambulatory Visit: Payer: Medicare Other | Admitting: Anesthesiology

## 2016-08-18 ENCOUNTER — Encounter
Admission: RE | Disposition: A | Discharge status: Home / Self Care | Payer: Self-pay | Source: Ambulatory Visit | Attending: Neurological Surgery

## 2016-08-18 ENCOUNTER — Ambulatory Visit: Payer: Medicare Other

## 2016-08-18 ENCOUNTER — Ambulatory Visit
Admission: RE | Admit: 2016-08-18 | Discharge: 2016-08-20 | Disposition: A | Discharge status: Home / Self Care | Payer: Medicare Other | Source: Ambulatory Visit | Attending: Neurological Surgery | Admitting: Neurological Surgery

## 2016-08-18 DIAGNOSIS — M48061 Spinal stenosis, lumbar region without neurogenic claudication: Secondary | ICD-10-CM | POA: Diagnosis not present

## 2016-08-18 DIAGNOSIS — I10 Essential (primary) hypertension: Secondary | ICD-10-CM | POA: Diagnosis not present

## 2016-08-18 DIAGNOSIS — Z419 Encounter for procedure for purposes other than remedying health state, unspecified: Secondary | ICD-10-CM

## 2016-08-18 DIAGNOSIS — Z881 Allergy status to other antibiotic agents status: Secondary | ICD-10-CM | POA: Insufficient documentation

## 2016-08-18 DIAGNOSIS — K5790 Diverticulosis of intestine, part unspecified, without perforation or abscess without bleeding: Secondary | ICD-10-CM | POA: Insufficient documentation

## 2016-08-18 DIAGNOSIS — E785 Hyperlipidemia, unspecified: Secondary | ICD-10-CM | POA: Diagnosis not present

## 2016-08-18 DIAGNOSIS — Z888 Allergy status to other drugs, medicaments and biological substances status: Secondary | ICD-10-CM | POA: Diagnosis not present

## 2016-08-18 DIAGNOSIS — Z9889 Other specified postprocedural states: Secondary | ICD-10-CM

## 2016-08-18 DIAGNOSIS — Z79899 Other long term (current) drug therapy: Secondary | ICD-10-CM | POA: Insufficient documentation

## 2016-08-18 DIAGNOSIS — E871 Hypo-osmolality and hyponatremia: Secondary | ICD-10-CM | POA: Diagnosis not present

## 2016-08-18 DIAGNOSIS — M5416 Radiculopathy, lumbar region: Secondary | ICD-10-CM | POA: Insufficient documentation

## 2016-08-18 DIAGNOSIS — Z90710 Acquired absence of both cervix and uterus: Secondary | ICD-10-CM | POA: Insufficient documentation

## 2016-08-18 DIAGNOSIS — M50222 Other cervical disc displacement at C5-C6 level: Secondary | ICD-10-CM | POA: Insufficient documentation

## 2016-08-18 DIAGNOSIS — M8580 Other specified disorders of bone density and structure, unspecified site: Secondary | ICD-10-CM | POA: Diagnosis not present

## 2016-08-18 DIAGNOSIS — T40605A Adverse effect of unspecified narcotics, initial encounter: Secondary | ICD-10-CM | POA: Insufficient documentation

## 2016-08-18 DIAGNOSIS — K130 Diseases of lips: Secondary | ICD-10-CM | POA: Insufficient documentation

## 2016-08-18 DIAGNOSIS — R110 Nausea: Secondary | ICD-10-CM | POA: Diagnosis not present

## 2016-08-18 DIAGNOSIS — R112 Nausea with vomiting, unspecified: Secondary | ICD-10-CM | POA: Diagnosis present

## 2016-08-18 DIAGNOSIS — N6019 Diffuse cystic mastopathy of unspecified breast: Secondary | ICD-10-CM | POA: Diagnosis not present

## 2016-08-18 DIAGNOSIS — Z87442 Personal history of urinary calculi: Secondary | ICD-10-CM | POA: Diagnosis not present

## 2016-08-18 DIAGNOSIS — K589 Irritable bowel syndrome without diarrhea: Secondary | ICD-10-CM | POA: Insufficient documentation

## 2016-08-18 DIAGNOSIS — E876 Hypokalemia: Secondary | ICD-10-CM | POA: Diagnosis not present

## 2016-08-18 DIAGNOSIS — D649 Anemia, unspecified: Secondary | ICD-10-CM | POA: Diagnosis not present

## 2016-08-18 DIAGNOSIS — E7800 Pure hypercholesterolemia, unspecified: Secondary | ICD-10-CM | POA: Diagnosis not present

## 2016-08-18 DIAGNOSIS — K219 Gastro-esophageal reflux disease without esophagitis: Secondary | ICD-10-CM | POA: Diagnosis not present

## 2016-08-18 DIAGNOSIS — M069 Rheumatoid arthritis, unspecified: Secondary | ICD-10-CM | POA: Diagnosis not present

## 2016-08-18 HISTORY — DX: Disorder of kidney and ureter, unspecified: N28.9

## 2016-08-18 HISTORY — PX: LUMBAR LAMINECTOMY/DECOMPRESSION MICRODISCECTOMY: SHX5026

## 2016-08-18 LAB — CBC
HEMATOCRIT: 34.9 % — AB (ref 35.0–47.0)
HEMOGLOBIN: 11.7 g/dL — AB (ref 12.0–16.0)
MCH: 31.8 pg (ref 26.0–34.0)
MCHC: 33.5 g/dL (ref 32.0–36.0)
MCV: 94.9 fL (ref 80.0–100.0)
Platelets: 283 10*3/uL (ref 150–440)
RBC: 3.68 MIL/uL — AB (ref 3.80–5.20)
RDW: 13.8 % (ref 11.5–14.5)
WBC: 5.2 10*3/uL (ref 3.6–11.0)

## 2016-08-18 LAB — CREATININE, SERUM
CREATININE: 1.12 mg/dL — AB (ref 0.44–1.00)
GFR, EST AFRICAN AMERICAN: 52 mL/min — AB (ref >60)
GFR, EST NON AFRICAN AMERICAN: 45 mL/min — AB (ref >60)

## 2016-08-18 SURGERY — LUMBAR LAMINECTOMY/DECOMPRESSION MICRODISCECTOMY 1 LEVEL
Anesthesia: General | Site: Spine Lumbar | Wound class: Clean

## 2016-08-18 MED ORDER — SODIUM CHLORIDE 0.9% FLUSH: 3.0000 mL | INTRAVENOUS | Status: DC | PRN

## 2016-08-18 MED ORDER — SODIUM CHLORIDE 0.9 % IV SOLN: 250.0000 mL | INTRAVENOUS | Status: DC

## 2016-08-18 MED ORDER — EPHEDRINE 5 MG/ML INJ
INTRAVENOUS | Status: AC
  Filled 2016-08-18: qty 10

## 2016-08-18 MED ORDER — GELATIN ABSORBABLE 12-7 MM EX MISC
CUTANEOUS | Status: AC
  Filled 2016-08-18: qty 1

## 2016-08-18 MED ORDER — BUPIVACAINE-EPINEPHRINE (PF) 0.25% -1:200000 IJ SOLN
INTRAMUSCULAR | Status: DC | PRN
  Administered 2016-08-18: 10 mL via PERINEURAL

## 2016-08-18 MED ORDER — EPHEDRINE SULFATE 50 MG/ML IJ SOLN
INTRAMUSCULAR | Status: DC | PRN
  Administered 2016-08-18: 10 mg via INTRAVENOUS
  Administered 2016-08-18 (×2): 5 mg via INTRAVENOUS
  Administered 2016-08-18 (×2): 10 mg via INTRAVENOUS

## 2016-08-18 MED ORDER — POLYETHYLENE GLYCOL 3350 17 G PO PACK
17.0000 g | PACK | Freq: Every day | ORAL | Status: DC
  Administered 2016-08-18 – 2016-08-20 (×2): 17 g via ORAL
  Filled 2016-08-18 (×2): qty 1

## 2016-08-18 MED ORDER — HYDROCODONE-ACETAMINOPHEN 7.5-325 MG PO TABS: 1.0000 | ORAL_TABLET | Freq: Four times a day (QID) | ORAL | 0 refills | Status: DC | PRN

## 2016-08-18 MED ORDER — PROPOFOL 10 MG/ML IV BOLUS
INTRAVENOUS | Status: AC
  Filled 2016-08-18: qty 20

## 2016-08-18 MED ORDER — SUCCINYLCHOLINE CHLORIDE 20 MG/ML IJ SOLN
INTRAMUSCULAR | Status: DC | PRN
  Administered 2016-08-18: 80 mg via INTRAVENOUS

## 2016-08-18 MED ORDER — DEXTROSE 5 % IV SOLN
2.0000 g | Freq: Three times a day (TID) | INTRAVENOUS | Status: AC
  Administered 2016-08-18 (×2): 2 g via INTRAVENOUS
  Filled 2016-08-18 (×2): qty 2000

## 2016-08-18 MED ORDER — CEFTRIAXONE SODIUM-DEXTROSE 1-3.74 GM-% IV SOLR
1.0000 g | INTRAVENOUS | Status: DC
  Filled 2016-08-18: qty 50

## 2016-08-18 MED ORDER — ROCURONIUM BROMIDE 100 MG/10ML IV SOLN
INTRAVENOUS | Status: DC | PRN
  Administered 2016-08-18: 30 mg via INTRAVENOUS

## 2016-08-18 MED ORDER — PANTOPRAZOLE SODIUM 40 MG IV SOLR
40.0000 mg | Freq: Every day | INTRAVENOUS | Status: DC
Start: 2016-08-18 — End: 2016-08-20
  Administered 2016-08-18 – 2016-08-19 (×2): 40 mg via INTRAVENOUS
  Filled 2016-08-18 (×2): qty 40

## 2016-08-18 MED ORDER — GABAPENTIN 300 MG PO CAPS
300.0000 mg | ORAL_CAPSULE | Freq: Every day | ORAL | Status: DC
  Administered 2016-08-18 – 2016-08-19 (×2): 300 mg via ORAL
  Filled 2016-08-18 (×2): qty 1

## 2016-08-18 MED ORDER — BACLOFEN 10 MG PO TABS: 10.0000 mg | ORAL_TABLET | Freq: Three times a day (TID) | ORAL | 0 refills | Status: DC | PRN

## 2016-08-18 MED ORDER — MENTHOL 3 MG MT LOZG
1.0000 | LOZENGE | OROMUCOSAL | Status: DC | PRN
  Filled 2016-08-18: qty 9

## 2016-08-18 MED ORDER — SUCCINYLCHOLINE CHLORIDE 20 MG/ML IJ SOLN
INTRAMUSCULAR | Status: AC
  Filled 2016-08-18: qty 1

## 2016-08-18 MED ORDER — PRAVASTATIN SODIUM 10 MG PO TABS
10.0000 mg | ORAL_TABLET | Freq: Every day | ORAL | Status: DC
  Filled 2016-08-18: qty 1

## 2016-08-18 MED ORDER — PROPOFOL 10 MG/ML IV BOLUS
INTRAVENOUS | Status: DC | PRN
  Administered 2016-08-18: 120 mg via INTRAVENOUS

## 2016-08-18 MED ORDER — ONDANSETRON HCL 4 MG PO TABS: 4.0000 mg | ORAL_TABLET | Freq: Four times a day (QID) | ORAL | Status: DC | PRN

## 2016-08-18 MED ORDER — SODIUM CHLORIDE 0.9 % IJ SOLN
INTRAMUSCULAR | Status: AC
Start: 2016-08-18 — End: 2016-08-18
  Filled 2016-08-18: qty 20

## 2016-08-18 MED ORDER — METHYLPREDNISOLONE ACETATE 40 MG/ML IJ SUSP
INTRAMUSCULAR | Status: AC
  Filled 2016-08-18: qty 1

## 2016-08-18 MED ORDER — ONDANSETRON HCL 4 MG/2ML IJ SOLN
INTRAMUSCULAR | Status: AC
Start: 2016-08-18 — End: 2016-08-18
  Filled 2016-08-18: qty 2

## 2016-08-18 MED ORDER — POLYETHYLENE GLYCOL 3350 17 G PO PACK: 17.0000 g | PACK | Freq: Every day | ORAL | Status: DC | PRN

## 2016-08-18 MED ORDER — THROMBIN 5000 UNITS EX SOLR
CUTANEOUS | Status: AC
  Filled 2016-08-18: qty 5000

## 2016-08-18 MED ORDER — FLUTICASONE PROPIONATE 50 MCG/ACT NA SUSP: 2.0000 | Freq: Every day | NASAL | Status: DC | PRN

## 2016-08-18 MED ORDER — CEFAZOLIN IN D5W 1 GM/50ML IV SOLN
INTRAVENOUS | Status: AC
  Filled 2016-08-18: qty 50

## 2016-08-18 MED ORDER — PHENOL 1.4 % MT LIQD
1.0000 | OROMUCOSAL | Status: DC | PRN
  Filled 2016-08-18: qty 177

## 2016-08-18 MED ORDER — VITAMIN D 1000 UNITS PO TABS
2000.0000 [IU] | ORAL_TABLET | Freq: Every day | ORAL | Status: DC
  Administered 2016-08-20: 2000 [IU] via ORAL
  Filled 2016-08-18: qty 2

## 2016-08-18 MED ORDER — CEFAZOLIN SODIUM-DEXTROSE 2-4 GM/100ML-% IV SOLN: 2.0000 g | Freq: Three times a day (TID) | INTRAVENOUS | Status: DC

## 2016-08-18 MED ORDER — FOLIC ACID 1 MG PO TABS
1.0000 mg | ORAL_TABLET | Freq: Two times a day (BID) | ORAL | Status: DC
  Administered 2016-08-18 – 2016-08-20 (×3): 1 mg via ORAL
  Filled 2016-08-18 (×3): qty 1

## 2016-08-18 MED ORDER — PSYLLIUM 95 % PO PACK
1.0000 | PACK | Freq: Every day | ORAL | Status: DC
  Administered 2016-08-18 – 2016-08-20 (×2): 1 via ORAL
  Filled 2016-08-18 (×2): qty 1

## 2016-08-18 MED ORDER — SODIUM CHLORIDE 0.9% FLUSH
3.0000 mL | Freq: Two times a day (BID) | INTRAVENOUS | Status: DC
  Administered 2016-08-18 – 2016-08-19 (×2): 3 mL via INTRAVENOUS

## 2016-08-18 MED ORDER — SUGAMMADEX SODIUM 200 MG/2ML IV SOLN
INTRAVENOUS | Status: AC
  Filled 2016-08-18: qty 2

## 2016-08-18 MED ORDER — MORPHINE SULFATE (PF) 4 MG/ML IV SOLN: 2.0000 mg | INTRAVENOUS | Status: DC | PRN

## 2016-08-18 MED ORDER — GELATIN ABSORBABLE 12-7 MM EX MISC
CUTANEOUS | Status: DC | PRN
  Administered 2016-08-18: 1

## 2016-08-18 MED ORDER — ONDANSETRON HCL 4 MG/2ML IJ SOLN: 4.0000 mg | Freq: Once | INTRAMUSCULAR | Status: DC | PRN

## 2016-08-18 MED ORDER — ROCURONIUM BROMIDE 50 MG/5ML IV SOLN
INTRAVENOUS | Status: AC
  Filled 2016-08-18: qty 1

## 2016-08-18 MED ORDER — FENTANYL CITRATE (PF) 100 MCG/2ML IJ SOLN
INTRAMUSCULAR | Status: DC | PRN
  Administered 2016-08-18: 50 ug via INTRAVENOUS

## 2016-08-18 MED ORDER — BACLOFEN 10 MG PO TABS
10.0000 mg | ORAL_TABLET | Freq: Three times a day (TID) | ORAL | Status: DC
  Administered 2016-08-18 – 2016-08-20 (×5): 10 mg via ORAL
  Filled 2016-08-18 (×5): qty 1

## 2016-08-18 MED ORDER — BACITRACIN 50000 UNITS IM SOLR
INTRAMUSCULAR | Status: AC
  Filled 2016-08-18: qty 1

## 2016-08-18 MED ORDER — THROMBIN 5000 UNITS EX SOLR
CUTANEOUS | Status: DC | PRN
  Administered 2016-08-18: 5000 [IU] via TOPICAL

## 2016-08-18 MED ORDER — ACETAMINOPHEN 325 MG PO TABS
650.0000 mg | ORAL_TABLET | ORAL | Status: DC | PRN
  Administered 2016-08-19 – 2016-08-20 (×3): 650 mg via ORAL
  Filled 2016-08-18 (×3): qty 2

## 2016-08-18 MED ORDER — ONDANSETRON HCL 4 MG/2ML IJ SOLN
INTRAMUSCULAR | Status: DC | PRN
  Administered 2016-08-18: 4 mg via INTRAVENOUS

## 2016-08-18 MED ORDER — FENTANYL CITRATE (PF) 100 MCG/2ML IJ SOLN
INTRAMUSCULAR | Status: AC
Start: 2016-08-18 — End: 2016-08-18
  Filled 2016-08-18: qty 2

## 2016-08-18 MED ORDER — SUGAMMADEX SODIUM 200 MG/2ML IV SOLN
INTRAVENOUS | Status: DC | PRN
  Administered 2016-08-18: 100 mg via INTRAVENOUS

## 2016-08-18 MED ORDER — HYDROCODONE-ACETAMINOPHEN 7.5-325 MG PO TABS
1.0000 | ORAL_TABLET | Freq: Four times a day (QID) | ORAL | Status: DC | PRN
  Administered 2016-08-18 – 2016-08-19 (×3): 2 via ORAL
  Filled 2016-08-18 (×3): qty 2

## 2016-08-18 MED ORDER — LIDOCAINE HCL (CARDIAC) 20 MG/ML IV SOLN
INTRAVENOUS | Status: DC | PRN
  Administered 2016-08-18: 50 mg via INTRAVENOUS

## 2016-08-18 MED ORDER — DILTIAZEM HCL ER COATED BEADS 240 MG PO CP24
240.0000 mg | ORAL_CAPSULE | Freq: Every day | ORAL | Status: DC
Start: 2016-08-19 — End: 2016-08-20
  Administered 2016-08-20: 240 mg via ORAL
  Filled 2016-08-18 (×2): qty 1

## 2016-08-18 MED ORDER — ACETAMINOPHEN 650 MG RE SUPP: 650.0000 mg | RECTAL | Status: DC | PRN

## 2016-08-18 MED ORDER — FENTANYL CITRATE (PF) 100 MCG/2ML IJ SOLN: 25.0000 ug | INTRAMUSCULAR | Status: DC | PRN

## 2016-08-18 MED ORDER — TRIAMTERENE-HCTZ 37.5-25 MG PO TABS
1.0000 | ORAL_TABLET | Freq: Every day | ORAL | Status: DC
  Administered 2016-08-18: 1 via ORAL
  Filled 2016-08-18 (×2): qty 1

## 2016-08-18 MED ORDER — LACTATED RINGERS IV SOLN
INTRAVENOUS | Status: DC
  Administered 2016-08-18: 06:00:00 via INTRAVENOUS

## 2016-08-18 MED ORDER — BACITRACIN 50000 UNITS IM SOLR
INTRAMUSCULAR | Status: DC | PRN
  Administered 2016-08-18: 50000 [IU]

## 2016-08-18 MED ORDER — CEFAZOLIN IN D5W 1 GM/50ML IV SOLN
1.0000 g | INTRAVENOUS | Status: AC
  Administered 2016-08-18: 1 g via INTRAVENOUS

## 2016-08-18 MED ORDER — LIDOCAINE HCL (PF) 2 % IJ SOLN
INTRAMUSCULAR | Status: AC
  Filled 2016-08-18: qty 2

## 2016-08-18 MED ORDER — PANTOPRAZOLE SODIUM 40 MG PO TBEC
40.0000 mg | DELAYED_RELEASE_TABLET | Freq: Every day | ORAL | Status: DC
  Administered 2016-08-20: 40 mg via ORAL
  Filled 2016-08-18: qty 1

## 2016-08-18 MED ORDER — LOSARTAN POTASSIUM 50 MG PO TABS
100.0000 mg | ORAL_TABLET | Freq: Every day | ORAL | Status: DC
  Administered 2016-08-20: 100 mg via ORAL
  Filled 2016-08-18: qty 2

## 2016-08-18 MED ORDER — SODIUM CHLORIDE 0.9 % IV SOLN
INTRAVENOUS | Status: DC
  Administered 2016-08-18: 12:00:00 via INTRAVENOUS

## 2016-08-18 MED ORDER — DOCUSATE SODIUM 100 MG PO CAPS
100.0000 mg | ORAL_CAPSULE | Freq: Two times a day (BID) | ORAL | Status: DC
  Administered 2016-08-18 – 2016-08-20 (×4): 100 mg via ORAL
  Filled 2016-08-18 (×4): qty 1

## 2016-08-18 MED ORDER — ONDANSETRON HCL 4 MG/2ML IJ SOLN
4.0000 mg | Freq: Four times a day (QID) | INTRAMUSCULAR | Status: DC | PRN
  Administered 2016-08-18 – 2016-08-19 (×3): 4 mg via INTRAVENOUS
  Filled 2016-08-18 (×3): qty 2

## 2016-08-18 MED ORDER — ENOXAPARIN SODIUM 40 MG/0.4ML ~~LOC~~ SOLN
40.0000 mg | SUBCUTANEOUS | Status: DC
  Administered 2016-08-19 – 2016-08-20 (×2): 40 mg via SUBCUTANEOUS
  Filled 2016-08-18 (×2): qty 0.4

## 2016-08-18 SURGICAL SUPPLY — 52 items
BUR NEURO DRILL SOFT 3.0X3.8M (BURR) ×3 IMPLANT
CANISTER SUCT 1200ML W/VALVE (MISCELLANEOUS) ×6 IMPLANT
CHLORAPREP W/TINT 26ML (MISCELLANEOUS) ×6 IMPLANT
CNTNR SPEC 2.5X3XGRAD LEK (MISCELLANEOUS) ×1
CONT SPEC 4OZ STER OR WHT (MISCELLANEOUS) ×2
CONTAINER SPEC 2.5X3XGRAD LEK (MISCELLANEOUS) ×1 IMPLANT
COUNTER NEEDLE 20/40 LG (NEEDLE) ×3 IMPLANT
COVER LIGHT HANDLE STERIS (MISCELLANEOUS) ×6 IMPLANT
DERMABOND ADVANCED (GAUZE/BANDAGES/DRESSINGS) ×2
DERMABOND ADVANCED .7 DNX12 (GAUZE/BANDAGES/DRESSINGS) ×1 IMPLANT
DRAPE C-ARM 42X72 X-RAY (DRAPES) ×15 IMPLANT
DRAPE LAPAROTOMY 100X77 ABD (DRAPES) ×3 IMPLANT
DRAPE MICROSCOPE SPINE 48X150 (DRAPES) ×3 IMPLANT
DRAPE POUCH INSTRU U-SHP 10X18 (DRAPES) ×3 IMPLANT
DRAPE SURG 17X11 SM STRL (DRAPES) ×12 IMPLANT
ELECT CAUTERY BLADE TIP 2.5 (TIP) ×3
ELECT EZSTD 165MM 6.5IN (MISCELLANEOUS) ×3
ELECTRODE CAUTERY BLDE TIP 2.5 (TIP) ×1 IMPLANT
ELECTRODE EZSTD 165MM 6.5IN (MISCELLANEOUS) ×1 IMPLANT
FEE INTRAOP MONITOR IMPULS NCS (MISCELLANEOUS) IMPLANT
GLOVE BIO SURGEON STRL SZ7.5 (GLOVE) ×6 IMPLANT
GLOVE BIOGEL PI IND STRL 8 (GLOVE) ×1 IMPLANT
GLOVE BIOGEL PI INDICATOR 8 (GLOVE) ×2
GOWN STRL REUS W/ TWL LRG LVL3 (GOWN DISPOSABLE) ×1 IMPLANT
GOWN STRL REUS W/ TWL XL LVL3 (GOWN DISPOSABLE) ×1 IMPLANT
GOWN STRL REUS W/TWL LRG LVL3 (GOWN DISPOSABLE) ×2
GOWN STRL REUS W/TWL XL LVL3 (GOWN DISPOSABLE) ×2
GRADUATE 1200CC STRL 31836 (MISCELLANEOUS) ×3 IMPLANT
INTRAOP MONITOR FEE IMPULS NCS (MISCELLANEOUS)
INTRAOP MONITOR FEE IMPULSE (MISCELLANEOUS)
KIT SPINAL PRONEVIEW (KITS) ×2 IMPLANT
KNIFE BAYONET SHORT DISCETOMY (MISCELLANEOUS) IMPLANT
MARKER SKIN DUAL TIP RULER LAB (MISCELLANEOUS) ×6 IMPLANT
NDL SAFETY ECLIPSE 18X1.5 (NEEDLE) ×3 IMPLANT
NEEDLE HYPO 18GX1.5 SHARP (NEEDLE) ×6
NS IRRIG 1000ML POUR BTL (IV SOLUTION) ×3 IMPLANT
PACK LAMINECTOMY NEURO (CUSTOM PROCEDURE TRAY) ×3 IMPLANT
PROBE MONO 100X0.75 ELECT 1.9M (MISCELLANEOUS) IMPLANT
SPOGE SURGIFLO 8M (HEMOSTASIS) ×2
SPONGE SURGIFLO 8M (HEMOSTASIS) ×1 IMPLANT
SUT MNCRL AB 3-0 PS2 27 (SUTURE) IMPLANT
SUT NURALON 4 0 TR CR/8 (SUTURE) IMPLANT
SUT VIC AB 2-0 CT1 18 (SUTURE) ×3 IMPLANT
SUT VICRYL 0 UR6 27IN ABS (SUTURE) ×3 IMPLANT
SYR 3ML LL SCALE MARK (SYRINGE) ×3 IMPLANT
SYRINGE 10CC LL (SYRINGE) ×3 IMPLANT
TOWEL OR 17X26 4PK STRL BLUE (TOWEL DISPOSABLE) ×9 IMPLANT
TUBE MATRX SPINL 18MM 4CM DISP (INSTRUMENTS) ×1
TUBE METRX SPINAL 18X4 DISP (INSTRUMENTS) ×1 IMPLANT
TUBING CONNECTING 10 (TUBING) ×2 IMPLANT
TUBING CONNECTING 10' (TUBING) ×1
WATER STERILE IRR 1000ML POUR (IV SOLUTION) ×3 IMPLANT

## 2016-08-18 NOTE — Plan of Care (Signed)
Problem: Physical Regulation: Goal: Ability to maintain clinical measurements within normal limits will improve Outcome: Progressing PT/OT eval pending  Problem: Activity: Goal: Will remain free from falls Outcome: Progressing Educated on fall preventions strategies, bed low, locked, nonskid footwear, call bell/phone in reach. Family at bedside. PT/OT eval pending  Problem: Education: Goal: Knowledge of the prescribed therapeutic regimen will improve Outcome: Progressing Discussed the POC for postoperative treatment and management plans. Pt/family is in agreement  Problem: Pain Management: Goal: Pain level will decrease Outcome: Progressing Discussed apppropriate pain goals and medication management regimen, pt Is in agreement with plan

## 2016-08-18 NOTE — Evaluation (Signed)
Occupational Therapy Evaluation Patient Details Name: Stacey Snyder MRN: [id number] DOB: [birth year] Today's Date: 08/18/2016    History of Present Illness 83 y.o. female s/p lumbar laminectomy, POD#0 at time of OT evaluation. PMHx significant for diverticulosis, fibrocystic breast disease, HTN, HLD, IBS, arthritis, osteopenia, nephrolithiasis, RA with hx of lumbar lamenectomy  (1983).    Clinical Impression   Pt seen for OT evaluation this date, POD#0 s/p lumbar laminectomy. Pt was independent at baseline with all ADL, IADL, and light caregiving duties for spouse who has dementia per pt report ("he had brain surgery at [facility] in December of last year") with self limiting due to pain from back. Pt presents with 8/10 back pain, decreased knowledge of precautions, decreased activity tolerance, and will benefit from skilled OT services to address noted impairments and functional deficits to maximize return to PLOF and minimize falls risk. Recommend HHOT services upon discharge.     Follow Up Recommendations  Home health OT    Equipment Recommendations  Other (comment) (AE - reacher, sock aid, long handled shoe horn, long handled sponge)    Recommendations for Other Services       Precautions / Restrictions Precautions Precautions: Fall Precaution Comments: no spinal precautions noted in chart, default spinal precautions followed - no bending, arching, twisting, handout provided; chart indicates no back brace needed Restrictions Weight Bearing Restrictions: No      Mobility Bed Mobility Overal bed mobility: Needs Assistance Bed Mobility: Rolling;Sidelying to Sit Rolling: Supervision Sidelying to sit: Supervision       General bed mobility comments: verbal cues to use log roll technique to maintain spinal precautions, pt able to demonstrate with no physical assist required  Transfers Overall transfer level: Needs assistance Equipment used: None Transfers: Sit to/from  Stand Sit to Stand: Supervision         General transfer comment: able to perform with close supervision and verbal cue for spinal precautions    Balance Overall balance assessment: No apparent balance deficits (not formally assessed) (good confidence during functional mobility, no LOB)                                         ADL either performed or assessed with clinical judgement   ADL Overall ADL's : Needs assistance/impaired     Grooming: Standing;Supervision/safety;Wash/dry hands Grooming Details (indicate cue type and reason): close supervision while standing to wash hands at sink after toileting with good balance             Lower Body Dressing: Sit to/from stand;Supervision/safety;Cueing for back precautions Lower Body Dressing Details (indicate cue type and reason): pt/family educated in use of AE for LB dressing tasks to maintain spinal precautions, pt verbalized understanding, politely declined practicing  Toilet Transfer: Supervision/safety;Comfort height toilet Toilet Transfer Details (indicate cue type and reason): 1 verbal cue for spinal precautions and close supervision to [facility] over toilet to mimic home set up [place] and Hygiene: Sitting/lateral lean;Independent;Sit to/from stand;Cueing for back precautions Toileting - Clothing Manipulation Details (indicate cue type and reason): pt able to perform hygiene and clothing mgt with 1 verbal cue during sit to stand and completing donning of underwear over hips to maintain spinal precautions     Functional mobility during ADLs: Min guard General ADL Comments: pt generally at supervision- min guard level for functional mobility and LB ADL tasks with occasional verbal cues to support  spinal precautions     Vision Baseline Vision/History: Wears glasses Wears Glasses: At all times Patient Visual Report: No change from baseline Vision Assessment?: No apparent visual deficits      Perception     Praxis Praxis Praxis tested?: Within functional limits    Pertinent Vitals/Pain Pain Assessment: 0-10 Pain Score: 8  Pain Location: back Pain Descriptors / Indicators: Aching;Grimacing Pain Intervention(s): Limited activity within patient's tolerance;Monitored during session;Repositioned;Premedicated before session     Hand Dominance     Extremity/Trunk Assessment Upper Extremity Assessment Upper Extremity Assessment: Overall WFL for tasks assessed   Lower Extremity Assessment Lower Extremity Assessment: Defer to PT evaluation;Overall [facility] for tasks assessed   Cervical / Trunk Assessment Cervical / Trunk Assessment: Normal   Communication Communication Communication: No difficulties   Cognition Arousal/Alertness: Awake/alert Behavior During Therapy: WFL for tasks assessed/performed Overall Cognitive Status: Within Functional Limits for tasks assessed                                     General Comments       Exercises Other Exercises Other Exercises: OT facilitated problem solving with pt/family for pt's caregiving duties at home to care for home/spouse/dog with spinal precautions; encouraged pt to ask for assist for gardening, and keep cell phone with her throughout the day when in and outside the home in the event of an emergency   Shoulder Instructions      Home Living Family/patient expects to be discharged to:: Private residence Living Arrangements: Spouse/significant other (+ small dog) Available Help at Discharge: Family;Available 24 hours/day;Available PRN/intermittently (daughters very active, able to assist as needed) Type of Home: House Home Access: Stairs to enter [employer] of Steps: 3 Entrance Stairs-Rails: Can reach both Home Layout: Two level;Able to live on main level with bedroom/bathroom Alternate Level Stairs-Number of Steps: sewing room is upstairs which she uses a lot; Restaurant manager, fast food bed/bath on main floor    Bathroom Shower/Tub: Walk-in shower;Curtain   Bathroom Toilet: Standard (with BSC over top) Bathroom Accessibility: Yes How Accessible: Accessible via walker Home Equipment: Foster - single point;Walker - 4 wheels;Grab bars - tub/shower;Bedside commode;Shower seat   Additional Comments: pt has access to shower seat, has not needed it      Prior Functioning/Environment Level of Independence: Independent        Comments: pt indep with ADL, IADL including med mgt, driving, gardening, caring for dog and spouse with cognitive decline (providing supervision, meal prep, no assist needed for self care), enjoys sewing, needlework, baking        OT Problem List: Pain;Decreased knowledge of precautions;Decreased safety awareness;Decreased activity tolerance      OT Treatment/Interventions: Self-care/ADL training;Therapeutic exercise;Therapeutic activities;Energy conservation;DME and/or AE instruction;Patient/family education    OT Goals(Current goals can be found in the care plan section) Acute Rehab OT Goals Patient Stated Goal: go home OT Goal Formulation: With patient/family Time For Goal Achievement: 08/25/16 Potential to Achieve Goals: Good  OT Frequency: Min 1X/week   Barriers to D/C:            Co-evaluation              End of Session Equipment Utilized During Treatment: Gait belt  Activity Tolerance: Patient tolerated treatment well Patient left: in bed;with call bell/phone within reach;with family/visitor present;with SCD's reapplied  OT Visit Diagnosis: Pain;Other abnormalities of gait and mobility (R26.89) Pain - part of body:  (back)  Time: 0349-1791 OT Time Calculation (min): 62 min Charges:  OT General Charges $OT Visit: 1 Procedure OT Evaluation $OT Eval Low Complexity: 1 Procedure OT Treatments $Self Care/Home Management : 53-67 mins G-Codes: OT G-codes **NOT FOR INPATIENT CLASS** Functional Assessment Tool Used: AM-PAC 6 Clicks Daily  Activity;Clinical judgement Functional Limitation: Self care Self Care Current Status (T0569): At least 20 percent but less than 40 percent impaired, limited or restricted Self Care Goal Status (V9480): At least 20 percent but less than 40 percent impaired, limited or restricted   Jeni Salles, MPH, MS, OTR/L ascom [phone] 08/18/16, 3:42 PM

## 2016-08-18 NOTE — Anesthesia Procedure Notes (Signed)
Procedure Name: Intubation Date/Time: 08/18/2016 7:29 AM Performed by: Aline Brochure Pre-anesthesia Checklist: Patient identified, Emergency Drugs available, Suction available and Patient being monitored Patient Re-evaluated:Patient Re-evaluated prior to inductionOxygen Delivery Method: Circle system utilized Preoxygenation: Pre-oxygenation with 100% oxygen Intubation Type: IV induction Ventilation: Mask ventilation without difficulty Laryngoscope Size: Mac and 3 Grade View: Grade I Tube type: Oral Tube size: 7.0 mm Number of attempts: 1 Airway Equipment and Method: Stylet Placement Confirmation: ETT inserted through vocal cords under direct vision,  positive ETCO2 and breath sounds checked- equal and bilateral Secured at: 20 cm Tube secured with: Tape Dental Injury: Teeth and Oropharynx as per pre-operative assessment

## 2016-08-18 NOTE — OR Nursing (Signed)
Verified consent and procedure with patient and Dr Aris Lot.

## 2016-08-18 NOTE — Progress Notes (Signed)
Pharmacy Antibiotic Note  Stacey Snyder is a 83 y.o. female admitted on 08/18/2016 with surgical prophylaxis.  Pharmacy has been consulted for Cefazolin dosing.  Plan: Cefazolin 1g IV x 1 for surgical prophylaxis     No data recorded.  No results for input(s): WBC, CREATININE, LATICACIDVEN, VANCOTROUGH, VANCOPEAK, VANCORANDOM, GENTTROUGH, GENTPEAK, GENTRANDOM, TOBRATROUGH, TOBRAPEAK, TOBRARND, AMIKACINPEAK, AMIKACINTROU, AMIKACIN in the last 168 hours.  Estimated Creatinine Clearance: 31.1 mL/min (A) (by C-G formula based on SCr of 1.07 mg/dL (H)).    Allergies  Allergen Reactions  . Codeine Nausea Only  . Flagyl [Metronidazole] Swelling and Other (See Comments)    Caused tongue irritation and swelling  . Skelaxin [Metaxalone] Swelling  . Tenormin [Atenolol] Other (See Comments)    Questionable swelling    Thank you for allowing pharmacy to be a part of this patient's care.  Tobie Lords, PharmD, BCPS Clinical Pharmacist 08/18/2016

## 2016-08-18 NOTE — Op Note (Signed)
Date of Surgery: 08/18/2016  Attending: Dr. Aris Lot  First Assistant: Liliane Bade PA  Preoperative Diagnosis: Lumbar stenosis with right L5 radiculopathy   Postoperative Diagnosis: same  Procedure: Minimally invasive lumbar laminectomy L4/5 with right L5 foraminotomy  Indications: Persistent Lumbar 5 pattern radiculopathy failed conservative measures with evidence of lateral recess/central canal stenosis at L4/5.  Findings: ligamentum flavum hypertrophy with lateral recess stenosis at L4/5  Preoperative Note:    Risks of surgery discussed include: infection, bleeding, stroke, coma, death, paralysis, CSF leak, nerve/spinal cord injury, numbness, tingling, weakness, complex regional pain syndrome, recurrent stenosis and/or disc herniation, vascular injury, development of instability, neck/back pain, need for further surgery, persistent symptoms, development of deformity, and the risks of anesthesia. They understood these risks and have agreed to proceed.  Operative Note:   The patient was then brought from the preoperative center with intravenous access established.  They underwent general anesthesia and endotracheal tube intubation.  They were then rotated on the [place] rail top where all pressure points were appropriately padded.  The skin was then thoroughly cleansed.  Perioperative antibiotic prophylaxis was administered.  Sterile prep and drapes were then applied and a timeout was then observed.  C-arm was brought into the field under sterile conditions and under AP visualization with the use of a K wire we established anatomic view the midline spinous process established with a marking pen  as well as the medial pedicle boundary.  Once these lines were established we then rotated into the lateral position and the corresponding level was identified counting from the sacrum with the use of a local anesthetic needle to the level of L4/5.  Once this was complete a 2 cm incision was opened with  the use of a #10 blade knife along the right medial pedicle boundary.  The Metrx tubes were sequentially advanced under live lateral fluoroscopy until a #18 tube by 80mm depth was established the final tube was then locked in place to the bed side attachment.  Fluoroscopy was then removed from the field.  The microscope was then sterilely brought into the field and muscle creep was hemostased with a bipolar and resected with a pituitary rongeur.  A Bovie extender was then used to expose the spinous process and lamina.  Careful attention was placed to not violate the facet capsule a 3 mm matchstick drill bit was then used to make a hemi-laminotomy trough until the ligamentum flavum was exposed.  This was extended to the base of the spinous process. I then wanded the Metrx tube medially to drill under the spinous process and contralateral lamina and removed this further with a 3-0 biting kerrison as well as ligamentum flavum in the lateral recess. The contralateral lamina was undercut using the same 3-0 matchstick drill bit. Once this was complete and the underlying ligamentum flavum was visualized centrally and in bilateral lateral recess, this was dissected with a 6-0 up angle curette and resected with a #2 #3 biting Kerrison.  The laminotomy opening was also expanded in similar fashion and hemostasis was obtained with Surgifoam and a patty as well as bone wax.  The rostral aspect of the caudal level of the lamina was also resected with a #2 biting Kerrison effort to further enhance exposure until a right angle ball tip feeler could be passed freely.  Once the underlying dura was visualized a Penfield 4 was then used to dissect and expose the traversing nerve root on the right side. I then removed a small portion of  the medial aspect of the right L4/5 facet til the ball tip feeler could be passed freely in the foramen. This was verified with AP and lateral xray to have an instrument in the foramen of her symptomatic  nerve root.  The venous plexus was hemostased with Surgifoam and light bipolar use. A valsalva maneuver was performed and no evidence of CSF leak was noted.    The tube system was then removed under microscopic visualization and hemostasis was obtained with a bipolar.  The fascial layer was reapproximated with the use of a 0- Vicryl suture.  Subcutaneous tissue layer was reapproximated using 2-0 Vicryl suture.  The skin was then cleansed and Dermabond was used to close the skin opening.  Patient was then rotated back to the preoperative bed awakened from anesthesia and taken to recovery all counts are correct in this case.      Era Bumpers, MD  Complications: none noted intraoperatively  Specimen: none  EBL: 10cc  Crystalloid: 600cc  Disposition: PACU extubated.

## 2016-08-18 NOTE — Anesthesia Postprocedure Evaluation (Signed)
Anesthesia Post Note  Patient: Stacey Snyder  Procedure(s) Performed: Procedure(s) (LRB): LUMBAR LAMINECTOMY/DECOMPRESSION MICRODISCECTOMY 1 LEVEL (N/A)  Patient location during evaluation: PACU Anesthesia Type: General Level of consciousness: awake and alert and oriented Pain management: pain level controlled Vital Signs Assessment: post-procedure vital signs reviewed and stable Respiratory status: spontaneous breathing Cardiovascular status: blood pressure returned to baseline Anesthetic complications: no     Last Vitals:  Vitals:   08/18/16 1011 08/18/16 1026  BP: (!) 126/49 111/77  Pulse: 73 72  Resp: 18 15  Temp:  36.4 C    Last Pain:  Vitals:   08/18/16 1243  TempSrc:   PainSc: 2                  Leonidas Boateng

## 2016-08-18 NOTE — Anesthesia Post-op Follow-up Note (Cosign Needed)
Anesthesia QCDR form completed.        

## 2016-08-18 NOTE — H&P (Signed)
Chief Complaint: Right lower extremity pain.  History of Present Illness: Stacey Snyder is a 83 y.o. female who presents with the chief complaint of right lower extremity pain that extends to the top of her foot is sharp in nature. It is worsened with ambulatory status. She is tried epidural steroid injections and physical therapy without substantial benefit. She is undergone a prior L3-4 right-sided decompression approximate 40 years prior. She also complains of tingling and hypersensitivity on the side of her foot. She denies any foot drop or loss of bowel or bladder function. She denies any left-sided symptoms. She denies any use of tobacco products. Resents back today with a repeat MRI lumbar spine as her prior was nearly 83 year old. Since her last evaluation she is having worsening discomfort from her back rating the posterior lateral leg to the top of her foot. She denies substantial left leg symptoms. Of note she also has developed left upper extremity deltoid pain that radiates to her hands as well. She denies any right upper extremity symptoms.  She denies any changes since we last spoke in clinic and is ready to proceed with surgery as scheduled today.  Review of Systems:  A 10 point review of systems is negative, except for the pertinent positives and negatives detailed in the HPI.  Past Medical History: Past Medical History:  Diagnosis Date  . Diverticulosis  . Fibrocystic breast disease  . Hyperlipidemia, unspecified  . Hypertension  . IBS (irritable bowel syndrome)  . Inflammatory arthritis  ([place]) a. Low positive FANA, negative rheumatoid factor, positive anti-CCP antibodies. b. Family history of rheumatoid c. s/p Prednisone d. Carpal tunnel e. Methotrexate f. Remicade  . Nephrolithiasis  . Osteopenia  ([place]) a. Steroids b. GI upset with Fosamax c. Rapid bone loss d. Reclast  . Pancreatitis 2007  s/p ERCP  . Rheumatoid arthritis (CMS-HCC)   Past Surgical History: Past  Surgical History:  Procedure Laterality Date  . COLONOSCOPY  . Hysterectomy 1993  with ovaries left intact  . Lithotripsy for nephrolithiasis  . Lumbar laminectomy 1983  . Right ring finger trigger release   Allergies: Allergies as of 08/05/2016 - Reviewed 08/05/2016  Allergen Reaction Noted  . Nsaids (non-steroidal anti-inflammatory drug) Abdominal Pain 11/19/2015  . Ciprofloxacin Other (See Comments)  . Codeine Nausea 08/22/2013  . Flagyl [metronidazole hcl] Anaphylaxis and Other (See Comments)  . Metronidazole benzoate Other (See Comments) 01/23/2015  . Skelaxin [metaxalone] Swelling 08/22/2013  . Tenormin [atenolol] Other (See Comments) 08/22/2013  . Tramadol Vomiting 11/19/2015   Medications: Outpatient Encounter Prescriptions as of 08/05/2016  Medication Sig Dispense Refill  . *calcium carbonate 2 tabs by mouth daily  . cholecalciferol (VITAMIN D3) 2,000 unit tablet Take by mouth.  . diltiazem (CARDIZEM CD) 240 MG CD capsule 1 cap by mouth daily  . ergocalciferol, vitamin D2, 50,000 unit capsule Take by mouth.  . fluticasone (FLONASE) 50 mcg/actuation nasal spray by Nasal route.  . folic acid (FOLVITE) 1 MG tablet Take 1 tablet (1,000 mcg total) by mouth 2 (two) times daily. 180 tablet 1  . lactobacillus acidophilus 500 million cell Tab 4 tab by mouth daily  . losartan (COZAAR) 100 MG tablet Take 1 tablet (100 mg total) by mouth daily. 30 tablet 3  . lovastatin (MEVACOR) 40 MG tablet 1 tab by mouth at bedtime  . magnesium oxide (MAG-OX) 400 mg tablet Take by mouth.  . methotrexate 25 mg/mL injection 0.7 mL under the skin once a week. 12 mL 1  .  mupirocin (BACTROBAN) 2 % ointment by Nasal route.  Marland Kitchen omeprazole (PRILOSEC) 20 MG DR capsule TAKE 1 CAPSULE DAILY  . traMADol (ULTRAM) 50 mg tablet Take 1 tablet (50 mg total) by mouth every 8 (eight) hours as needed for Pain for up to 40 doses. 40 tablet 0  . triamterene-hydrochlorothiazide (MAXZIDE-25) 37.5-25 mg tablet one by mouth    No facility-administered encounter medications on file as of 08/05/2016.   Social History: Social History  Substance Use Topics  . Smoking status: Never Smoker  . Smokeless tobacco: Never Used  . Alcohol use No   Family Medical History: No family history on file.  Physical Examination: There were no vitals filed for this visit.  General: Patient is well developed, well nourished, calm, collected, and in no apparent distress.  Psychiatric: Patient is non-anxious.  Head: Pupils equal, round, and reactive to light.  ENT: Oral mucosa appears well hydrated.  Neck: Supple. Full range of motion.  Respiratory: Patient is breathing without any difficulty.  Extremities: No edema.  Vascular: Palpable pulses.  Skin: On exposed skin, there are no abnormal skin lesions.  NEUROLOGICAL:  General: In no acute distress.  Awake, alert, oriented to person, place, and time. Pupils equal round and reactive to light. Facial tone is symmetric. Tongue protrusion is midline.   ROM of spine: Palpation of spine:   Strength: Side Biceps Triceps Deltoid Interossei Grip Wrist Ext. Wrist Flex.  R 5 5 5 5 5 5 5   L 5 5 5 5 5 5 5    Side Iliopsoas Quads Hamstring PF DF EHL  R          5           5             5           5    5        5  L           5            5           5          5       5          5   Reflexes are 2+ and symmetric at the patella and achilles. Bilateral upper and lower extremity sensation is intact to light touch and pin prick. Clonus is not present. Toes are down-going. Gait is normal. . Hoffman's is absent. Chest: CTAB CV: S1S2. RRR  Imaging: July 22, 2016 MRI Lumbar Spine: IMPRESSION: 1. At L2-3 there is a broad-based disc bulge. Severe bilateral facet arthropathy. Severe spinal stenosis and bilateral lateral recess stenosis. Mild bilateral foraminal stenosis. 2. At L3-4 there is a broad-based disc bulge. Partial right facetectomy and right laminectomy. Severe left  facet arthropathy. Moderate bilateral foraminal stenosis. Mild narrowing of bilateral lateral recesses. 3. At L4-5 there is a broad-based disc bulge with a small central disc protrusion. Moderate bilateral facet arthropathy. Moderate-severe spinal stenosis.  I have personally reviewed the images and agree with the above interpretation.  Assessment and Plan: Stacey Snyder is a pleasant 83 y.o. female with progressive right lower extremity radiculopathy that is failed conservative measures. I reviewed her imaging with her which reveals substantial stenosis at L2/3 and L4/5. She is not having any symptoms that fit the L2 or L3 nerve root but is having significant right L5 fitting radiculopathy. We discussed multilevel decompression versus decompression  alone at the affected nerve root which she prefers to proceed with. I have offered her a MIS right-sided approach for laminectomy and lateral recess stenosis with L5 nerve root foraminotomy. Due to her prior surgery which includes a complete facetectomy if I am unable to dock the Metrx minimally invasive tubes successfully would then convert to a standard open midline approach. I discussed the risk and benefits of surgery to which she has agreed to proceed with. If the L2 or L3 nerve patterns become symptomatic with then try conservative measures but she is not having claudication symptoms at this time either.MRI cervical spine was obtained preoperatively due to concerns for stenosis.   Thank you for involving me in the care of this patient. I will keep you apprised of the patient's progress.  This note was partially dictated using voice recognition software, so please excuse any errors that were not corrected.   Era Bumpers, MD

## 2016-08-18 NOTE — OR Nursing (Signed)
OR will start antibiotics per D Twine RN

## 2016-08-18 NOTE — Transfer of Care (Signed)
Immediate Anesthesia Transfer of Care Note  Patient: Stacey Snyder  Procedure(s) Performed: Procedure(s) with comments: LUMBAR LAMINECTOMY/DECOMPRESSION MICRODISCECTOMY 1 LEVEL (N/A) - L4-5 Laminectomy, MIS, L5 foraminotomy  Patient Location: PACU  Anesthesia Type:General  Level of Consciousness: awake  Airway & Oxygen Therapy: Patient connected to face mask oxygen  Post-op Assessment: Post -op Vital signs reviewed and stable  Post vital signs: stable  Last Vitals:  Vitals:   08/18/16 0709 08/18/16 0926  BP: (!) 135/52 (!) 135/53  Pulse:  75  Resp:  (!) 21  Temp:  36.5 C    Last Pain:  Vitals:   08/18/16 0926  TempSrc: Temporal  PainSc:          Complications: No apparent anesthesia complications

## 2016-08-18 NOTE — Anesthesia Preprocedure Evaluation (Signed)
Anesthesia Evaluation  Patient identified by MRN, date of birth, ID band Patient awake    Reviewed: Allergy & Precautions, NPO status , Patient's Chart, lab work & pertinent test results  History of Anesthesia Complications (+) PONV and history of anesthetic complications  Airway Mallampati: II  TM Distance: <3 FB     Dental  (+) Chipped   Pulmonary    Pulmonary exam normal        Cardiovascular hypertension, Pt. on medications Normal cardiovascular exam     Neuro/Psych    GI/Hepatic GERD  Medicated,IBS   Endo/Other    Renal/GU Renal InsufficiencyRenal disease     Musculoskeletal  (+) Arthritis , Osteoarthritis,    Abdominal Normal abdominal exam  (+)   Peds  Hematology  (+) anemia ,   Anesthesia Other Findings   Reproductive/Obstetrics                             Anesthesia Physical Anesthesia Plan  ASA: III  Anesthesia Plan: General   Post-op Pain Management:    Induction: Intravenous  Airway Management Planned: Oral ETT  Additional Equipment:   Intra-op Plan:   Post-operative Plan: Extubation in OR  Informed Consent: I have reviewed the patients History and Physical, chart, labs and discussed the procedure including the risks, benefits and alternatives for the proposed anesthesia with the patient or authorized representative who has indicated his/her understanding and acceptance.     Plan Discussed with: CRNA and Surgeon  Anesthesia Plan Comments:         Anesthesia Quick Evaluation

## 2016-08-19 ENCOUNTER — Encounter: Payer: Medicare Other | Admitting: Internal Medicine

## 2016-08-19 ENCOUNTER — Encounter: Payer: Self-pay | Admitting: Internal Medicine

## 2016-08-19 DIAGNOSIS — Z9889 Other specified postprocedural states: Secondary | ICD-10-CM

## 2016-08-19 DIAGNOSIS — M48061 Spinal stenosis, lumbar region without neurogenic claudication: Secondary | ICD-10-CM | POA: Diagnosis not present

## 2016-08-19 DIAGNOSIS — M5416 Radiculopathy, lumbar region: Secondary | ICD-10-CM | POA: Diagnosis not present

## 2016-08-19 DIAGNOSIS — R1110 Vomiting, unspecified: Secondary | ICD-10-CM | POA: Diagnosis not present

## 2016-08-19 DIAGNOSIS — R112 Nausea with vomiting, unspecified: Secondary | ICD-10-CM | POA: Diagnosis present

## 2016-08-19 DIAGNOSIS — N6019 Diffuse cystic mastopathy of unspecified breast: Secondary | ICD-10-CM | POA: Diagnosis not present

## 2016-08-19 DIAGNOSIS — E785 Hyperlipidemia, unspecified: Secondary | ICD-10-CM | POA: Diagnosis not present

## 2016-08-19 DIAGNOSIS — M4800 Spinal stenosis, site unspecified: Secondary | ICD-10-CM | POA: Diagnosis not present

## 2016-08-19 DIAGNOSIS — K589 Irritable bowel syndrome without diarrhea: Secondary | ICD-10-CM | POA: Diagnosis not present

## 2016-08-19 DIAGNOSIS — I10 Essential (primary) hypertension: Secondary | ICD-10-CM | POA: Diagnosis not present

## 2016-08-19 DIAGNOSIS — K5790 Diverticulosis of intestine, part unspecified, without perforation or abscess without bleeding: Secondary | ICD-10-CM | POA: Diagnosis not present

## 2016-08-19 LAB — COMPREHENSIVE METABOLIC PANEL
ALBUMIN: 3.4 g/dL — AB (ref 3.5–5.0)
ALT: 10 U/L — AB (ref 14–54)
AST: 25 U/L (ref 15–41)
Alkaline Phosphatase: 50 U/L (ref 38–126)
Anion gap: 5 (ref 5–15)
BUN: 18 mg/dL (ref 6–20)
CHLORIDE: 96 mmol/L — AB (ref 101–111)
CO2: 30 mmol/L (ref 22–32)
CREATININE: 0.73 mg/dL (ref 0.44–1.00)
Calcium: 8.5 mg/dL — ABNORMAL LOW (ref 8.9–10.3)
GFR calc Af Amer: >60 mL/min (ref >60)
GLUCOSE: 157 mg/dL — AB (ref 65–99)
Potassium: 2.8 mmol/L — ABNORMAL LOW (ref 3.5–5.1)
Sodium: 131 mmol/L — ABNORMAL LOW (ref 135–145)
TOTAL PROTEIN: 6 g/dL — AB (ref 6.5–8.1)
Total Bilirubin: 0.5 mg/dL (ref 0.3–1.2)

## 2016-08-19 LAB — LIPASE, BLOOD: LIPASE: 23 U/L (ref 11–51)

## 2016-08-19 MED ORDER — ONDANSETRON HCL 4 MG/2ML IJ SOLN
4.0000 mg | Freq: Once | INTRAMUSCULAR | Status: AC
  Administered 2016-08-19: 4 mg via INTRAVENOUS
  Filled 2016-08-19: qty 2

## 2016-08-19 MED ORDER — SODIUM CHLORIDE 0.9 % IV SOLN
30.0000 meq | Freq: Once | INTRAVENOUS | Status: AC
  Administered 2016-08-19: 30 meq via INTRAVENOUS
  Filled 2016-08-19: qty 15

## 2016-08-19 MED ORDER — ONDANSETRON HCL 4 MG PO TABS: 4.0000 mg | ORAL_TABLET | Freq: Four times a day (QID) | ORAL | 0 refills | Status: DC | PRN

## 2016-08-19 MED ORDER — CARISOPRODOL 350 MG PO TABS: 350.0000 mg | ORAL_TABLET | Freq: Three times a day (TID) | ORAL | 0 refills | Status: DC

## 2016-08-19 MED ORDER — SODIUM CHLORIDE 0.9 % IV BOLUS (SEPSIS)
250.0000 mL | Freq: Once | INTRAVENOUS | Status: AC
  Administered 2016-08-19: 250 mL via INTRAVENOUS

## 2016-08-19 MED ORDER — SODIUM CHLORIDE 0.9 % IV SOLN
INTRAVENOUS | Status: DC
  Administered 2016-08-19: 15:00:00 via INTRAVENOUS

## 2016-08-19 MED ORDER — MAGNESIUM SULFATE 2 GM/50ML IV SOLN
2.0000 g | Freq: Once | INTRAVENOUS | Status: DC
  Filled 2016-08-19: qty 50

## 2016-08-19 MED ORDER — POTASSIUM CHLORIDE CRYS ER 20 MEQ PO TBCR
20.0000 meq | EXTENDED_RELEASE_TABLET | Freq: Two times a day (BID) | ORAL | Status: DC
  Administered 2016-08-19 – 2016-08-20 (×2): 20 meq via ORAL
  Filled 2016-08-19 (×2): qty 1

## 2016-08-19 NOTE — Discharge Summary (Addendum)
Physician Discharge Summary  Patient ID: Stacey Snyder MRN: [id number] DOB/AGE: March 28, 1933 83 y.o.  Admit date: 08/18/2016 Discharge date: 08/20/2016  Admission Diagnoses: lumbar stenosis with radiculopathy  Discharge Diagnoses:  Active Problems:   Lumbar stenosis   PONV (postoperative nausea and vomiting)   Discharged Condition: good  Hospital Course: Admitted for observation from MIS L4/5 laminectomy/foraminotomy. Ambulated POD 0. Tolerating PO and voided without difficulty. Pain controlled at time of discharge. Wound noted to be healing well. Full strength in lower extremities and reduction in radicular pain. She stayed overnight on POD1 due to nausea and inability to tolerate PO. Desiring to go home on POD2 after resolution of nausea  Consults: none  Significant Diagnostic Studies: none Treatments: MIS L4/5 laminectomy w/ foraminotomy  Discharge Exam: Blood pressure (!) 157/59, pulse 85, temperature 99.8 F (37.7 C), temperature source Oral, resp. rate 18, height 5\' 1"  (1.549 m), weight 52.2 kg (115 lb), SpO2 98 %. 5/5 strength in dorsi/plantarflexion , light touch intact, incision: c/d/I.  Disposition: 01-Home or Self Care   Allergies as of 08/20/2016      Reactions   Codeine Nausea Only   Flagyl [metronidazole] Swelling, Other (See Comments)   Caused tongue irritation and swelling   Nsaids    Patient to avoid based on kidney function   Skelaxin [metaxalone] Swelling   Tenormin [atenolol] Other (See Comments)   Questionable swelling      Medication List    STOP taking these medications   ciprofloxacin 250 MG tablet Commonly known as:  CIPRO     TAKE these medications   acyclovir ointment 5 % Commonly known as:  ZOVIRAX Apply 1 application topically 4 (four) times daily as needed (fever blister). Apply to lip   baclofen 10 MG tablet Commonly known as:  LIORESAL Take 1 tablet (10 mg total) by mouth 3 (three) times daily as needed for muscle  spasms.   carisoprodol 350 MG tablet Commonly known as:  SOMA Take 1 tablet (350 mg total) by mouth 3 (three) times daily.   diltiazem 240 MG 24 hr capsule Commonly known as:  DILACOR XR Take 1 capsule (240 mg total) by mouth daily.   fluticasone 50 MCG/ACT nasal spray Commonly known as:  FLONASE USE TWO SPRAY(S) IN EACH NOSTRIL ONCE DAILY What changed:  how much to take  how to take this  when to take this  reasons to take this  additional instructions   folic acid 1 MG tablet Commonly known as:  FOLVITE Take 1 mg by mouth 2 (two) times daily.   gabapentin 300 MG capsule Commonly known as:  NEURONTIN Take 300 mg by mouth at bedtime.   HYDROcodone-acetaminophen 7.5-325 MG tablet Commonly known as:  NORCO Take 1-2 tablets by mouth every 6 (six) hours as needed for moderate pain or severe pain.   losartan 100 MG tablet Commonly known as:  COZAAR TAKE 1 TABLET DAILY   lovastatin 40 MG tablet Commonly known as:  MEVACOR Take 1 tablet (40 mg total) by mouth daily.   methotrexate 25 MG/ML (PF) Soln Inject 17.5 mg into the skin once a week. Patient takes 17.5 mg (0.7 ml) on Monday or Tuesday of each week.   omeprazole 20 MG capsule Commonly known as:  PRILOSEC TAKE 1 CAPSULE DAILY   ondansetron 4 MG tablet Commonly known as:  ZOFRAN Take 1 tablet (4 mg total) by mouth every 6 (six) hours as needed for nausea or vomiting.   polyethylene glycol packet Commonly known as:  MIRALAX / GLYCOLAX Take 17 g by mouth daily.   psyllium 58.6 % packet Commonly known as:  METAMUCIL Take 1 packet by mouth daily.   REMICADE IV Inject into the vein every 6 (six) weeks. Per Dr Jefm Bryant   traMADol 50 MG tablet Commonly known as:  ULTRAM Take 1-2 tablets by mouth every 6 hours as needed for moderate to severe pain   triamterene-hydrochlorothiazide 37.5-25 MG tablet Commonly known as:  MAXZIDE-25 Take 1 tablet by mouth daily.   Vitamin D 2000 units tablet Take 2,000  Units by mouth daily.        SignedMeade Maw 08/20/2016, 12:39 PM

## 2016-08-19 NOTE — Progress Notes (Signed)
Orders received to d/c discharge for today, order received for NS 50 ml/hr, consult hospitalist. Orders placed.

## 2016-08-19 NOTE — NC FL2 (Signed)
[place] LEVEL OF CARE SCREENING TOOL     IDENTIFICATION  Patient Name: Stacey Snyder Birthdate: Nov 17, 1932 Sex: female Admission Date (Current Location): 08/18/2016  [place] and [place] Number:  Engineering geologist and Address:  [facility], [address], [place], [place] [address]      Provider Number: [id number]  Attending Physician Name and Address:  Bayard Hugger, MD  Relative Name and Phone Number:       Current Level of Care: Hospital Recommended Level of Care: [place] Prior Approval Number:    Date Approved/Denied:   PASRR Number:  [id number])  Discharge Plan: SNF    Current Diagnoses: Patient Active Problem List   Diagnosis Date Noted  . Lumbar stenosis 08/18/2016  . Abdominal pain 12/03/2015  . Abnormal liver function tests 10/18/2015  . Anemia 10/18/2015  . Sepsis ([place]) 10/07/2015  . Hyponatremia 10/07/2015  . Lip lesion 04/23/2015  . Scalp lesion 04/23/2015  . Loss of weight 04/23/2015  . Stress 04/23/2015  . Left shoulder pain 12/20/2014  . UTI (urinary tract infection) 08/20/2014  . Health care maintenance 08/20/2014  . Skin lesion 04/17/2014  . Fatigue 04/13/2014  . Shoulder pain, right 12/17/2013  . Trigger finger 08/21/2013  . Left hip pain 04/17/2013  . GERD (gastroesophageal reflux disease) 04/03/2012  . Inflammatory arthritis ([place]) 03/30/2012  . Diverticulosis 03/30/2012  . Osteopenia 03/30/2012  . Hypertension 03/30/2012  . Hypercholesterolemia 03/30/2012    Orientation RESPIRATION BLADDER Height & Weight     Self, Time, Situation, Place  Normal Continent Weight: 115 lb (52.2 kg) Height:  5\' 1"  (154.9 cm)  BEHAVIORAL SYMPTOMS/MOOD NEUROLOGICAL BOWEL NUTRITION STATUS   (None. )  (None. ) Continent Diet (Diet: Regular)  AMBULATORY STATUS COMMUNICATION OF NEEDS Skin   Extensive Assist Verbally Surgical wounds (Incision: Back )                        Personal Care Assistance Level of Assistance  Bathing, Feeding, Dressing Bathing Assistance: Limited assistance Feeding assistance: Independent Dressing Assistance: Limited assistance     Functional Limitations Info  Sight, Hearing, Speech Sight Info: Adequate Hearing Info: Adequate Speech Info: Adequate    SPECIAL CARE FACTORS FREQUENCY  PT (By licensed PT), OT (By licensed OT)     PT Frequency:  (5) OT Frequency:  (5)            Contractures      Additional Factors Info  Code Status, Allergies Code Status Info:  (Full Code) Allergies Info:  (Codeine, Flagyl Metronidazole, Nsaids, Skelaxin Metaxalone, Tenormin Atenolol)           Current Medications (08/19/2016):  This is the current hospital active medication list Current Facility-Administered Medications  Medication Dose Route Frequency Provider Last Rate Last Dose  . 0.9 %  sodium chloride infusion  250 mL Intravenous Continuous Don M Drinkwater, PA-C      . 0.9 %  sodium chloride infusion   Intravenous Continuous Bayard Hugger, MD 50 mL/hr at 08/18/16 1214    . acetaminophen (TYLENOL) tablet 650 mg  650 mg Oral Q4H PRN Fleeta Emmer Drinkwater, PA-C       Or  . acetaminophen (TYLENOL) suppository 650 mg  650 mg Rectal Q4H PRN Fleeta Emmer Drinkwater, PA-C      . baclofen (LIORESAL) tablet 10 mg  10 mg Oral TID Edison Pace, PA-C   10 mg at 08/18/16 2123  .  cholecalciferol (VITAMIN D) tablet 2,000 Units  2,000 Units Oral Daily Don M Drinkwater, PA-C      . diltiazem (CARDIZEM CD) 24 hr capsule 240 mg  240 mg Oral Daily Don M Drinkwater, PA-C      . docusate sodium (COLACE) capsule 100 mg  100 mg Oral BID Don M Drinkwater, PA-C   100 mg at 08/18/16 2123  . enoxaparin (LOVENOX) injection 40 mg  40 mg Subcutaneous Q24H Don M Drinkwater, PA-C   40 mg at 08/19/16 7517  . fluticasone (FLONASE) 50 MCG/ACT nasal spray 2 spray  2 spray Each Nare Daily PRN Fleeta Emmer Drinkwater, PA-C      . folic acid (FOLVITE) tablet 1 mg  1 mg Oral BID  Fleeta Emmer Drinkwater, PA-C   1 mg at 08/18/16 2123  . gabapentin (NEURONTIN) capsule 300 mg  300 mg Oral QHS Fleeta Emmer Drinkwater, PA-C   300 mg at 08/18/16 2123  . HYDROcodone-acetaminophen (NORCO) 7.5-325 MG per tablet 1-2 tablet  1-2 tablet Oral Q6H PRN Edison Pace, PA-C   2 tablet at 08/19/16 0409  . losartan (COZAAR) tablet 100 mg  100 mg Oral Daily Don M Drinkwater, PA-C      . menthol-cetylpyridinium (CEPACOL) lozenge 3 mg  1 lozenge Oral PRN Fleeta Emmer Drinkwater, PA-C       Or  . phenol (CHLORASEPTIC) mouth spray 1 spray  1 spray Mouth/Throat PRN Fleeta Emmer Drinkwater, PA-C      . morphine 4 MG/ML injection 2 mg  2 mg Intravenous Q2H PRN Don M Drinkwater, PA-C      . ondansetron [facility]) tablet 4 mg  4 mg Oral Q6H PRN Fleeta Emmer Drinkwater, PA-C       Or  . ondansetron [facility]) injection 4 mg  4 mg Intravenous Q6H PRN Edison Pace, PA-C   4 mg at 08/18/16 2242  . pantoprazole (PROTONIX) EC tablet 40 mg  40 mg Oral Daily Don M Drinkwater, PA-C      . pantoprazole (PROTONIX) injection 40 mg  40 mg Intravenous QHS Fleeta Emmer Drinkwater, PA-C   40 mg at 08/18/16 2123  . polyethylene glycol (MIRALAX / GLYCOLAX) packet 17 g  17 g Oral Daily Don M Drinkwater, PA-C   17 g at 08/18/16 1144  . polyethylene glycol (MIRALAX / GLYCOLAX) packet 17 g  17 g Oral Daily PRN Don M Drinkwater, PA-C      . pravastatin (PRAVACHOL) tablet 10 mg  10 mg Oral q1800 Don M Drinkwater, PA-C      . psyllium (HYDROCIL/METAMUCIL) packet 1 packet  1 packet Oral Daily Edison Pace, PA-C   1 packet at 08/18/16 1144  . sodium chloride flush (NS) 0.9 % injection 3 mL  3 mL Intravenous Q12H Fleeta Emmer Drinkwater, PA-C   3 mL at 08/18/16 2123  . sodium chloride flush (NS) 0.9 % injection 3 mL  3 mL Intravenous PRN Don M Drinkwater, PA-C      . triamterene-hydrochlorothiazide [facility]) 37.5-25 MG per tablet 1 tablet  1 tablet Oral Daily Fleeta Emmer Drinkwater, PA-C   1 tablet at 08/18/16 1143     Discharge Medications: Please see discharge  summary for a list of discharge medications.  Relevant Imaging Results:  Relevant Lab Results:   Additional Information  (SSN: [id number])  Danie Chandler, Student-Social Work

## 2016-08-19 NOTE — Plan of Care (Signed)
Problem: Safety: Goal: Ability to remain free from injury will improve Outcome: Progressing Bed in lowest position, call bell within reach and bed alarm on.    

## 2016-08-19 NOTE — Evaluation (Signed)
Physical Therapy Evaluation Patient Details Name: Stacey Snyder MRN: [id number] DOB: 31-May-1932 Today's Date: 08/19/2016   History of Present Illness  83 y.o. female s/p lumbar laminectomy 08/18/16. PMHx significant for diverticulosis, fibrocystic breast disease, HTN, HLD, IBS, arthritis, osteopenia, nephrolithiasis, RA with hx of lumbar lamenectomy  (1983).   Clinical Impression  Pt was eager to work with PT but was highly effected by pain meds and not only was she nauseous (vomited X2 during the session) and was unable stay awake and keep on task as she was "in another world."  Pt was able to get up to the EOB with hand rails and did not have any pain with the effort, she was inconsistently safe with standing with and w/o AD but overall was so highly effected by meds that she was unsafe as a factor of response to meds more than any real phyiscal/functional issue.      Follow Up Recommendations Home health PT    Equipment Recommendations  Rolling walker with 5" wheels    Recommendations for Other Services       Precautions / Restrictions Precautions Precautions: Back Restrictions Weight Bearing Restrictions: No      Mobility  Bed Mobility Overal bed mobility: Modified Independent Bed Mobility: Supine to Sit;Sit to Supine     Supine to sit: Supervision Sit to supine: Supervision   General bed mobility comments: Pt with light use of the rails, she was able to maintain log roll positioning and did not need assist  Transfers Overall transfer level: Modified independent Equipment used: Rolling walker (2 wheeled) Transfers: Sit to/from Stand Sit to Stand: Supervision         General transfer comment: Pt with decreased cognitive situation due to meds but was able to rise to standing with and w/o AD during the session.    Ambulation/Gait Ambulation/Gait assistance: Min assist Ambulation Distance (Feet): 75 Feet Assistive device: Rolling walker (2 wheeled)        General Gait Details: Pt is able do some ambulation and though she showed good effort (and was safe during moments of clarity) she also appeared to nearly fall asleep multiple times t/o ambulation and could not stay focused.  Had pt not been so effected by meds she would have been safe/fine but she did need occasional assist this AM secondary to mental status  Stairs Stairs: Yes Stairs assistance: Supervision (min assist during medication induced lethargy/impulsivity) Stair Management: Two rails Number of Stairs: 4 General stair comments: Pt was phyiscally able to negotiate up/down steps - however secondary to response to medications she was out of if and generally unsafe due to this factor, physically she was capable and would have been independent/safe apart from the meds and nausea  Wheelchair Mobility    Modified Rankin (Stroke Patients Only)       Balance Overall balance assessment:  (highly effected by meds, safe/independent when feeling clear)                                           Pertinent Vitals/Pain Pain Assessment:  (reports only minimal pain, meds have her in "another world")    Home Living Family/patient expects to be discharged to:: Private residence Living Arrangements: Spouse/significant other Available Help at Discharge: Family;Available 24 hours/day;Available PRN/intermittently Type of Home: House Home Access: Stairs to enter Entrance Stairs-Rails: Can reach both Entrance Stairs-Number of Steps: 3 Home  Layout: Two level;Able to live on main level with bedroom/bathroom        Prior Function Level of Independence: Independent         Comments: pt indep with ADL, IADL including med mgt, driving, gardening, caring for dog and spouse with cognitive decline (providing supervision, meal prep, no assist needed for self care), enjoys sewing, needlework, baking     Hand Dominance        Extremity/Trunk Assessment   Upper Extremity  Assessment Upper Extremity Assessment: Defer to OT evaluation    Lower Extremity Assessment Lower Extremity Assessment: Generalized weakness;Difficult to assess due to impaired cognition (R LE seemed to have functional strength and had sensation)       Communication   Communication: No difficulties  Cognition Arousal/Alertness: Suspect due to medications (impulsive, nearly falling asleep, acknowledging she is off ) Behavior During Therapy: Impulsive ("snowed over" secondary to meds) Overall Cognitive Status:  (pt aware she is not herself due to meds)                                        General Comments      Exercises     Assessment/Plan    PT Assessment Patient needs continued PT services  PT Problem List Decreased strength;Decreased activity tolerance;Decreased balance;Decreased mobility;Decreased coordination;Decreased cognition;Decreased knowledge of use of DME;Decreased safety awareness       PT Treatment Interventions DME instruction;Gait training;Stair training;Functional mobility training;Therapeutic activities;Therapeutic exercise;Balance training;Neuromuscular re-education;Cognitive remediation;Patient/family education    PT Goals (Current goals can be found in the Care Plan section)  Acute Rehab PT Goals Patient Stated Goal: go home PT Goal Formulation: With patient/family Time For Goal Achievement: 09/02/16 Potential to Achieve Goals: Good    Frequency 7X/week   Barriers to discharge        Co-evaluation               End of Session Equipment Utilized During Treatment: Gait belt Activity Tolerance:  (limited due to being highly effected by meds, nausea) Patient left: with bed alarm set;with call bell/phone within reach;with nursing/sitter in room;with family/visitor present Nurse Communication: Mobility status (pt vomited X2, feeling lethargic from meds) PT Visit Diagnosis: Muscle weakness (generalized) (M62.81);Difficulty in  walking, not elsewhere classified (R26.2)    Time: 5885-0277 PT Time Calculation (min) (ACUTE ONLY): 29 min   Charges:   PT Evaluation $PT Eval Low Complexity: 1 Procedure     PT G Codes:   PT G-Codes **NOT FOR INPATIENT CLASS** Functional Assessment Tool Used: AM-PAC 6 Clicks Basic Mobility Functional Limitation: Mobility: Walking and moving around Mobility: Walking and Moving Around Current Status (A1287): At least 40 percent but less than 60 percent impaired, limited or restricted Mobility: Walking and Moving Around Goal Status [phone]): At least 1 percent but less than 20 percent impaired, limited or restricted    Kreg Shropshire, DPT 08/19/2016, 11:37 AM

## 2016-08-19 NOTE — Consult Note (Signed)
[facility] [facility] [place] NAME: Stacey Snyder    MR#:  [id number]  DATE OF BIRTH:  12-22-1932  DATE OF ADMISSION:  08/18/2016  PRIMARY CARE PHYSICIAN: Einar Pheasant, MD   CONSULT REQUESTING/REFERRING PHYSICIAN: Dr. Aris Lot  REASON FOR CONSULT: Vomiting  CHIEF COMPLAINT:  No chief complaint on file.  Vomiting  HISTORY OF PRESENT ILLNESS:  Stacey Snyder  is a 83 y.o. female with a known history of Hypertension, spinal stenosis, pancreatitis post ERCP in the past presents to the hospital for spinal stenosis surgery. Patient had her surgery yesterday. Was placed on Norco for pain medications. She did well yesterday. Today she worked with physical therapy. But early in the morning after waking up she felt woozy. Started having nausea and vomiting. She has had similar problems with tramadol in the past. Allergy to codeine. She had a normal BM yesterday. No abd pain/fever.  PAST MEDICAL HISTORY:   Past Medical History:  Diagnosis Date  . Diverticulosis   . Fibrocystic breast disease   . GERD (gastroesophageal reflux disease)   . History of kidney stones   . Hypercholesterolemia   . Hypertension   . Hypertension   . IBS (irritable bowel syndrome)   . Inflammatory arthritis    positive anti-CCP abs, s/p prednisone, MTX, Remicade  . Nephrolithiasis   . Osteopenia    GI upset with Fosamax  . Pancreatitis 2007   s/p ERCP  . Pancreatitis   . PONV (postoperative nausea and vomiting)   . Renal insufficiency   . Spinal stenosis of lumbosacral region     PAST SURGICAL HISTOIRY:   Past Surgical History:  Procedure Laterality Date  . ABDOMINAL HYSTERECTOMY  1993   ovaries not removed  . BREAST BIOPSY Left 1984   negative  . BREAST CYST EXCISION Left 2010   negative  . LITHOTRIPSY    . LUMBAR LAMINECTOMY  1983  . LUMBAR LAMINECTOMY/DECOMPRESSION MICRODISCECTOMY N/A 08/18/2016   Procedure: LUMBAR LAMINECTOMY/DECOMPRESSION  MICRODISCECTOMY 1 LEVEL;  Surgeon: Bayard Hugger, MD;  Location: [facility] ORS;  Service: Neurosurgery;  Laterality: N/A;  L4-5 Laminectomy, MIS, L5 foraminotomy  . TRIGGER FINGER RELEASE     right ring finger    SOCIAL HISTORY:   Social History  Substance Use Topics  . Smoking status: Never Smoker  . Smokeless tobacco: Never Used  . Alcohol use No    FAMILY HISTORY:   Family History  Problem Relation Age of Onset  . Ovarian cancer Mother   . Renal Disease Mother   . Heart disease Mother   . Diabetes Mother   . Hypertension Mother     DRUG ALLERGIES:   Allergies  Allergen Reactions  . Codeine Nausea Only  . Flagyl [Metronidazole] Swelling and Other (See Comments)    Caused tongue irritation and swelling  . Nsaids     Patient to avoid based on kidney function  . Skelaxin [Metaxalone] Swelling  . Tenormin [Atenolol] Other (See Comments)    Questionable swelling    REVIEW OF SYSTEMS:   ROS  CONSTITUTIONAL: No fever, fatigue or weakness.  EYES: No blurred or double vision.  EARS, NOSE, AND THROAT: No tinnitus or ear pain.  RESPIRATORY: No cough, shortness of breath, wheezing or hemoptysis.  CARDIOVASCULAR: No chest pain, orthopnea, edema.  GASTROINTESTINAL:  diarrhea or abdominal pain. Positive nausea and diarrhea GENITOURINARY: No dysuria, hematuria.  ENDOCRINE: No polyuria, nocturia,  HEMATOLOGY: No anemia, easy bruising or bleeding SKIN: No rash or lesion.  MUSCULOSKELETAL: Low back pain NEUROLOGIC: No tingling, numbness, weakness.  PSYCHIATRY: No anxiety or depression.   MEDICATIONS AT HOME:   Prior to Admission medications   Medication Sig Start Date End Date Taking? Authorizing Provider  Cholecalciferol (VITAMIN D) 2000 UNITS tablet Take 2,000 Units by mouth daily.   Yes Historical Provider, MD  diltiazem (DILACOR XR) 240 MG 24 hr capsule Take 1 capsule (240 mg total) by mouth daily. 08/13/16  Yes Einar Pheasant, MD  fluticasone (FLONASE) 50 MCG/ACT nasal spray  USE TWO SPRAY(S) IN EACH NOSTRIL ONCE DAILY Patient taking differently: Place 2 sprays into both nostrils daily as needed for allergies.  10/18/15  Yes Einar Pheasant, MD  folic acid (FOLVITE) 1 MG tablet Take 1 mg by mouth 2 (two) times daily.    Yes Historical Provider, MD  gabapentin (NEURONTIN) 300 MG capsule Take 300 mg by mouth at bedtime.  08/05/16  Yes Historical Provider, MD  losartan (COZAAR) 100 MG tablet TAKE 1 TABLET DAILY 06/13/16  Yes Einar Pheasant, MD  lovastatin (MEVACOR) 40 MG tablet Take 1 tablet (40 mg total) by mouth daily. 06/07/15  Yes Einar Pheasant, MD  methotrexate 25 MG/ML SOLN Inject 17.5 mg into the skin once a week. Patient takes 17.5 mg (0.7 ml) on Monday or Tuesday of each week.   Yes Historical Provider, MD  omeprazole (PRILOSEC) 20 MG capsule TAKE 1 CAPSULE DAILY 04/04/16  Yes Einar Pheasant, MD  polyethylene glycol (MIRALAX / GLYCOLAX) packet Take 17 g by mouth daily.   Yes Historical Provider, MD  psyllium (METAMUCIL) 58.6 % packet Take 1 packet by mouth daily.   Yes Historical Provider, MD  triamterene-hydrochlorothiazide (MAXZIDE-25) 37.5-25 MG tablet Take 1 tablet by mouth daily. 08/13/16  Yes Einar Pheasant, MD  acyclovir ointment (ZOVIRAX) 5 % Apply 1 application topically 4 (four) times daily as needed (fever blister). Apply to lip Patient not taking: Reported on 08/18/2016 10/08/15   Henreitta Leber, MD  baclofen (LIORESAL) 10 MG tablet Take 1 tablet (10 mg total) by mouth 3 (three) times daily as needed for muscle spasms. 08/18/16   Edison Pace, PA-C  carisoprodol (SOMA) 350 MG tablet Take 1 tablet (350 mg total) by mouth 3 (three) times daily. 08/19/16   Bayard Hugger, MD  ciprofloxacin (CIPRO) 250 MG tablet Take 1 tablet (250 mg total) by mouth 2 (two) times daily. Patient not taking: Reported on 08/05/2016 06/02/16   Burnard Hawthorne, FNP  HYDROcodone-acetaminophen (NORCO) 7.5-325 MG tablet Take 1-2 tablets by mouth every 6 (six) hours as needed for moderate pain  or severe pain. 08/18/16   Edison Pace, PA-C  InFLIXimab (REMICADE IV) Inject into the vein every 6 (six) weeks. Per Dr Jefm Bryant    Historical Provider, MD  ondansetron (ZOFRAN) 4 MG tablet Take 1 tablet (4 mg total) by mouth every 6 (six) hours as needed for nausea or vomiting. 08/19/16   Bayard Hugger, MD  traMADol (ULTRAM) 50 MG tablet Take 1-2 tablets by mouth every 6 hours as needed for moderate to severe pain Patient not taking: Reported on 08/18/2016 12/03/15   Hinda Kehr, MD      VITAL SIGNS:  Blood pressure (!) 131/42, pulse 72, temperature 99 F (37.2 C), temperature source Oral, resp. rate 18, height 5\' 1"  (1.549 m), weight 52.2 kg (115 lb), SpO2 93 %.  PHYSICAL EXAMINATION:  GENERAL:  83 y.o.-year-old patient lying in the bed with no acute distress.  EYES: Pupils equal, round, reactive to light  and accommodation. No scleral icterus. Extraocular muscles intact.  HEENT: Head atraumatic, normocephalic. Oropharynx and nasopharynx clear.  NECK:  Supple, no jugular venous distention. No thyroid enlargement, no tenderness.  LUNGS: Normal breath sounds bilaterally, no wheezing, rales,rhonchi or crepitation. No use of accessory muscles of respiration.  CARDIOVASCULAR: S1, S2 normal. No murmurs, rubs, or gallops.  ABDOMEN: Soft, nontender, nondistended. Bowel sounds present. No organomegaly or mass.  EXTREMITIES: No pedal edema, cyanosis, or clubbing.  NEUROLOGIC: Cranial nerves II through XII are intact. Muscle strength 5/5 in all extremities. Sensation intact. Gait not checked.  PSYCHIATRIC: The patient is alert and oriented x 3.  SKIN: No obvious rash, lesion, or ulcer.   LABORATORY PANEL:   CBC  Recent Labs Lab 08/18/16 1125  WBC 5.2  HGB 11.7*  HCT 34.9*  PLT 283   ------------------------------------------------------------------------------------------------------------------  Chemistries   Recent Labs Lab 08/18/16 1125  CREATININE 1.12*    ------------------------------------------------------------------------------------------------------------------  Cardiac Enzymes No results for input(s): TROPONINI in the last 168 hours. ------------------------------------------------------------------------------------------------------------------  RADIOLOGY:  Dg Lumbar Spine 2-3 Views  Result Date: 08/18/2016 CLINICAL DATA:  83 year old female for lumbar surgery. Subsequent encounter. EXAM: DG C-ARM 61-120 MIN; LUMBAR SPINE - 2-3 VIEW COMPARISON:  07/22/2016 MR. FINDINGS: Five intraoperative views of the lumbar spine submitted for review after surgery. Level assignment as per prior MR. First film reveals metallic probe overlying the L4 vertebral body. Second film reveals metallic probe posterior to the L4-5 disc space. Third film reveals metallic structure overlying the lower L4 level. Fourth film reveals metallic probe overlying the inferior aspect L4 vertebra and the mid aspect of the L5 vertebra. Fifth film reveals superior probe directed inferiorly with the tip posterior to the inferior L5 vertebra. Inferior probe directed horizontally with the tip posterior to the upper L4-5 disc space. IMPRESSION: Last film reveals superior probe directed inferiorly with the tip posterior to the inferior L5 vertebra. Inferior probe directed horizontally with the tip posterior to the upper L4-5 disc space. Electronically Signed   By: Genia Del M.D.   On: 08/18/2016 09:10   Dg C-arm 61-120 Min  Result Date: 08/18/2016 CLINICAL DATA:  83 year old female for lumbar surgery. Subsequent encounter. EXAM: DG C-ARM 61-120 MIN; LUMBAR SPINE - 2-3 VIEW COMPARISON:  07/22/2016 MR. FINDINGS: Five intraoperative views of the lumbar spine submitted for review after surgery. Level assignment as per prior MR. First film reveals metallic probe overlying the L4 vertebral body. Second film reveals metallic probe posterior to the L4-5 disc space. Third film reveals  metallic structure overlying the lower L4 level. Fourth film reveals metallic probe overlying the inferior aspect L4 vertebra and the mid aspect of the L5 vertebra. Fifth film reveals superior probe directed inferiorly with the tip posterior to the inferior L5 vertebra. Inferior probe directed horizontally with the tip posterior to the upper L4-5 disc space. IMPRESSION: Last film reveals superior probe directed inferiorly with the tip posterior to the inferior L5 vertebra. Inferior probe directed horizontally with the tip posterior to the upper L4-5 disc space. Electronically Signed   By: Genia Del M.D.   On: 08/18/2016 09:10    EKG:   Orders placed or performed during the hospital encounter of 08/07/16  . EKG 12-Lead  . EKG 12-Lead    IMPRESSION AND PLAN:   * Nausea and vomiting Likely due to narcotic medications. Stop meds. Will check LFTs and lipase. Had normal BM yesterday. No need for imaging a this time. Zofran PRN  * Spinal stenosis s/p L4/5  MIS laminectomy and foraminotomy for decompression Tylenol PRN Will add ibuprofen.  * HTN On Losartan, cardizem and HCTZ-Trimatrene  * DVT prophylaxis Lovenox   All the records are reviewed and case discussed with Consulting provider. Management plans discussed with the patient, family and they are in agreement.  TOTAL TIME TAKING CARE OF THIS PATIENT: 40 minutes.    Hillary Bow R M.D on 08/19/2016 at 3:35 PM  Between 7am to 6pm - Pager - [phone]  After 6pm go to [email or web address] - password [email or web address] [facility]  Office  [phone]  CC: Primary care Physician: Einar Pheasant, MD  Note: This dictation was prepared with Dragon dictation along with smaller phrase technology. Any transcriptional errors that result from this process are unintentional.

## 2016-08-19 NOTE — Progress Notes (Signed)
Patient ambulating well with stand-by, contact/guard assist. Pain managed with PRN and scheduled medications.

## 2016-08-19 NOTE — Care Management Note (Signed)
Case Management Note  Patient Details  Name: Stacey Snyder MRN: [id number] Date of Birth: 16-Jul-1932  Subjective/Objective:  Spoke iwth Delilah Shan ( Dr. Sedalia Muta nurse) at Dr. Sedalia Muta office. She states patient is set up with [facility] for home health . Please reconsult this CM if needs arise. Case Closed.                 Action/Plan:   Expected Discharge Date:  08/19/16               Expected Discharge Plan:     In-House Referral:     Discharge planning Services     Post Acute Care Choice:    Choice offered to:     DME Arranged:    DME Agency:     HH Arranged:    HH Agency:     Status of Service:     If discussed at [employer], dates discussed:    Additional Comments:  Jolly Mango, RN 08/19/2016, 2:05 PM

## 2016-08-19 NOTE — Progress Notes (Signed)
@  Progress Note   Date: 08/19/2016  24 Hours: ambulated in halls POD 0. Tolerating PO. Has voided.   HPI: 83yo F with long standing lumbar stenosis with radiculopathy failed conservative measures. Now POD 1 from L4/5 MIS laminectomy and foraminotomy for decompression. Radicular symptoms vastly improved postop          Problem List Patient Active Problem List   Diagnosis Date Noted  . Lumbar stenosis 08/18/2016  . Abdominal pain 12/03/2015  . Abnormal liver function tests 10/18/2015  . Anemia 10/18/2015  . Sepsis ([place]) 10/07/2015  . Hyponatremia 10/07/2015  . Lip lesion 04/23/2015  . Scalp lesion 04/23/2015  . Loss of weight 04/23/2015  . Stress 04/23/2015  . Left shoulder pain 12/20/2014  . UTI (urinary tract infection) 08/20/2014  . Health care maintenance 08/20/2014  . Skin lesion 04/17/2014  . Fatigue 04/13/2014  . Shoulder pain, right 12/17/2013  . Trigger finger 08/21/2013  . Left hip pain 04/17/2013  . GERD (gastroesophageal reflux disease) 04/03/2012  . Inflammatory arthritis ([place]) 03/30/2012  . Diverticulosis 03/30/2012  . Osteopenia 03/30/2012  . Hypertension 03/30/2012  . Hypercholesterolemia 03/30/2012    Medications: Scheduled Meds: . baclofen  10 mg Oral TID  . cholecalciferol  2,000 Units Oral Daily  . diltiazem  240 mg Oral Daily  . docusate sodium  100 mg Oral BID  . enoxaparin (LOVENOX) injection  40 mg Subcutaneous Q24H  . folic acid  1 mg Oral BID  . gabapentin  300 mg Oral QHS  . losartan  100 mg Oral Daily  . ondansetron (ZOFRAN) IV  4 mg Intravenous Once  . pantoprazole  40 mg Oral Daily  . pantoprazole (PROTONIX) IV  40 mg Intravenous QHS  . polyethylene glycol  17 g Oral Daily  . pravastatin  10 mg Oral q1800  . psyllium  1 packet Oral Daily  . sodium chloride flush  3 mL Intravenous Q12H  . triamterene-hydrochlorothiazide  1 tablet Oral Daily   Continuous Infusions: . sodium chloride    . sodium chloride 50 mL/hr at 08/18/16  1214   PRN Meds:.acetaminophen **OR** acetaminophen, fluticasone, HYDROcodone-acetaminophen, menthol-cetylpyridinium **OR** phenol, morphine injection, ondansetron **OR** ondansetron (ZOFRAN) IV, polyethylene glycol, sodium chloride flush  Labs:  Results for orders placed or performed during the hospital encounter of 08/18/16 (from the past 24 hour(s))  CBC   Collection Time: 08/18/16 11:25 AM  Result Value Ref Range   WBC 5.2 3.6 - 11.0 K/uL   RBC 3.68 (L) 3.80 - 5.20 MIL/uL   Hemoglobin 11.7 (L) 12.0 - 16.0 g/dL   HCT 34.9 (L) 35.0 - 47.0 %   MCV 94.9 80.0 - 100.0 fL   MCH 31.8 26.0 - 34.0 pg   MCHC 33.5 32.0 - 36.0 g/dL   RDW 13.8 11.5 - 14.5 %   Platelets 283 150 - 440 K/uL  Creatinine, serum   Collection Time: 08/18/16 11:25 AM  Result Value Ref Range   Creatinine, Ser 1.12 (H) 0.44 - 1.00 mg/dL   GFR calc non Af Amer 45 (L) >60 mL/min   GFR calc Af Amer 52 (L) >60 mL/min    Lab Results  Component Value Date   WBC 5.2 08/18/2016   WBC 6.2 08/07/2016   HCT 34.9 (L) 08/18/2016   HCT 34.8 (L) 08/07/2016   PLT 283 08/18/2016   PLT 277 08/07/2016    Lab Results  Component Value Date   INR 0.92 08/07/2016   APTT 28 08/07/2016   Lab Results  Component Value Date   NA 139 08/07/2016   NA 132 (L) 01/02/2016   K 3.7 08/07/2016   K 3.5 01/02/2016   BUN 38 (H) 08/07/2016   BUN 26 (H) 01/02/2016   No results found for: MG  Imaging:  Mr Cervical Spine Wo Contrast  Result Date: 08/08/2016 CLINICAL DATA:  No headaches, no known injury. Left arm pain and numbness. EXAM: MRI CERVICAL SPINE WITHOUT CONTRAST TECHNIQUE: Multiplanar, multisequence MR imaging of the cervical spine was performed. No intravenous contrast was administered. COMPARISON:  None. FINDINGS: Patient motion degrades image quality limiting evaluation. Alignment: Physiologic. Vertebrae: No fracture, evidence of discitis, or bone lesion. Cord: Normal signal and morphology. Posterior Fossa, vertebral arteries,  paraspinal tissues: Negative. Disc levels: Discs: Disc spaces are preserved. C2-3: No significant disc bulge. No neural foraminal stenosis. No central canal stenosis. C3-4: No significant disc bulge. No neural foraminal stenosis. No central canal stenosis. C4-5: No significant disc bulge. Mild right facet arthropathy and mild right foraminal narrowing. No left foraminal narrowing. No central canal stenosis. C5-6: Central disc protrusion contacting the ventral cervical spinal cord. No neural foraminal stenosis. No central canal stenosis. C6-7: No significant disc bulge. No neural foraminal stenosis. No central canal stenosis. C7-T1: No significant disc bulge. No neural foraminal stenosis. No central canal stenosis. IMPRESSION: 1. At C5-6 there is a central disc protrusion contacting the ventral cervical spinal cord. No foraminal or central canal stenosis. Electronically Signed   By: Kathreen Devoid   On: 08/08/2016 12:14    Vital Signs: Temp:  [97.6 F (36.4 C)-98.7 F (37.1 C)] 98 F (36.7 C) (04/17 0020) Pulse Rate:  [47-78] 73 (04/17 0020) Resp:  [15-21] 19 (04/17 0020) BP: (111-141)/(41-77) 139/41 (04/17 0020) SpO2:  [94 %-100 %] 94 % (04/17 0020) Weight:  [52.2 kg (115 lb)] 52.2 kg (115 lb) (04/16 1100) Temp (24hrs), Avg:98 F (36.7 C), Min:97.6 F (36.4 C), Max:98.7 F (37.1 C)  Weight: 52.2 kg (115 lb) @IOLAST2SHIFTS @  Physical Exam:    5/5 strength DF/PF, KF/E and pain limited in hip flexors but antigravity SILT Incision: c/d/I. Ambulated in halls last night  A/P: 83yo F POD 1 from L4/5 MIS laminectomy for stenosis and radiculopathy. Symptoms vastly improved -mobilize -dc planning today -f/u in clinic 2 weeks   @ESIGNATURE @ Era Bumpers, MD    NOTE: This document was prepared using a combination of digital dictation and SmartPhrase techonology. Any transcriptional errors that result from this process are unintentional.

## 2016-08-19 NOTE — Progress Notes (Signed)
Update Note: Patient with significant nausea today. Tolerating some PO. Based on this will have her stay the night, restart IVF, back down narcotics and have a medicine consult. Will cancel DC for today; if improved, plan for dc tomorrow. Patient in agreement  Era Bumpers, MD

## 2016-08-19 NOTE — Progress Notes (Signed)
Clinical Social Worker (CSW) received SNF consult. PT is recommending home health. RN case manager aware of above. Please reconsult if future social work needs arise. CSW signing off.   Asyah Candler, LCSW [phone] 

## 2016-08-19 NOTE — Progress Notes (Signed)
Patient potassium 2.8 MD notified. Orders received.

## 2016-08-19 NOTE — Progress Notes (Signed)
Pt still feeling nauseous, unable to take scheduled morning medications. MD Aris Lot notified. Orders received to D/C all narcotics, 250 cc NS bolus. Order placed.

## 2016-08-20 DIAGNOSIS — M0600 Rheumatoid arthritis without rheumatoid factor, unspecified site: Secondary | ICD-10-CM | POA: Diagnosis not present

## 2016-08-20 DIAGNOSIS — I10 Essential (primary) hypertension: Secondary | ICD-10-CM | POA: Diagnosis not present

## 2016-08-20 DIAGNOSIS — M4800 Spinal stenosis, site unspecified: Secondary | ICD-10-CM | POA: Diagnosis not present

## 2016-08-20 DIAGNOSIS — M48061 Spinal stenosis, lumbar region without neurogenic claudication: Secondary | ICD-10-CM | POA: Diagnosis not present

## 2016-08-20 DIAGNOSIS — Z4789 Encounter for other orthopedic aftercare: Secondary | ICD-10-CM | POA: Diagnosis not present

## 2016-08-20 DIAGNOSIS — R2689 Other abnormalities of gait and mobility: Secondary | ICD-10-CM | POA: Diagnosis not present

## 2016-08-20 DIAGNOSIS — K5790 Diverticulosis of intestine, part unspecified, without perforation or abscess without bleeding: Secondary | ICD-10-CM | POA: Diagnosis not present

## 2016-08-20 DIAGNOSIS — K589 Irritable bowel syndrome without diarrhea: Secondary | ICD-10-CM | POA: Diagnosis not present

## 2016-08-20 DIAGNOSIS — E785 Hyperlipidemia, unspecified: Secondary | ICD-10-CM | POA: Diagnosis not present

## 2016-08-20 DIAGNOSIS — M6281 Muscle weakness (generalized): Secondary | ICD-10-CM | POA: Diagnosis not present

## 2016-08-20 DIAGNOSIS — R1110 Vomiting, unspecified: Secondary | ICD-10-CM | POA: Diagnosis not present

## 2016-08-20 DIAGNOSIS — N6019 Diffuse cystic mastopathy of unspecified breast: Secondary | ICD-10-CM | POA: Diagnosis not present

## 2016-08-20 DIAGNOSIS — M5416 Radiculopathy, lumbar region: Secondary | ICD-10-CM | POA: Diagnosis not present

## 2016-08-20 LAB — COMPREHENSIVE METABOLIC PANEL
ALBUMIN: 3.5 g/dL (ref 3.5–5.0)
ALT: 10 U/L — ABNORMAL LOW (ref 14–54)
ANION GAP: 8 (ref 5–15)
AST: 28 U/L (ref 15–41)
Alkaline Phosphatase: 55 U/L (ref 38–126)
BUN: 15 mg/dL (ref 6–20)
CHLORIDE: 100 mmol/L — AB (ref 101–111)
CO2: 29 mmol/L (ref 22–32)
Calcium: 9.2 mg/dL (ref 8.9–10.3)
Creatinine, Ser: 0.98 mg/dL (ref 0.44–1.00)
GFR calc Af Amer: >60 mL/min (ref >60)
GFR calc non Af Amer: 53 mL/min — ABNORMAL LOW (ref >60)
Glucose, Bld: 120 mg/dL — ABNORMAL HIGH (ref 65–99)
POTASSIUM: 3.5 mmol/L (ref 3.5–5.1)
SODIUM: 137 mmol/L (ref 135–145)
Total Bilirubin: 0.5 mg/dL (ref 0.3–1.2)
Total Protein: 6.4 g/dL — ABNORMAL LOW (ref 6.5–8.1)

## 2016-08-20 NOTE — Progress Notes (Signed)
[facility] NAME: Stacey Snyder    MR#:  [id number]  DATE OF BIRTH:  10-13-1932  SUBJECTIVE: Consult follow-up for nausea, vomiting, hypokalemia. Last nausea yesterday, nausea secondary to narcotics. Patient received IV potassium supplements ,also  getting IV fluids. No nausea this morning.   CHIEF COMPLAINT:  No chief complaint on file.   REVIEW OF SYSTEMS:   ROS CONSTITUTIONAL: No fever, fatigue or weakness.  EYES: No blurred or double vision.  EARS, NOSE, AND THROAT: No tinnitus or ear pain.  RESPIRATORY: No cough, shortness of breath, wheezing or hemoptysis.  CARDIOVASCULAR: No chest pain, orthopnea, edema.  GASTROINTESTINAL: No nausea, vomiting, diarrhea or abdominal pain.  GENITOURINARY: No dysuria, hematuria.  ENDOCRINE: No polyuria, nocturia,  HEMATOLOGY: No anemia, easy bruising or bleeding SKIN: No rash or lesion. MUSCULOSKELETAL: No joint pain or arthritis.   NEUROLOGIC: No tingling, numbness, weakness.  PSYCHIATRY: No anxiety or depression.   DRUG ALLERGIES:   Allergies  Allergen Reactions  . Codeine Nausea Only  . Flagyl [Metronidazole] Swelling and Other (See Comments)    Caused tongue irritation and swelling  . Nsaids     Patient to avoid based on kidney function  . Skelaxin [Metaxalone] Swelling  . Tenormin [Atenolol] Other (See Comments)    Questionable swelling    VITALS:  Blood pressure (!) 157/59, pulse 85, temperature 99.8 F (37.7 C), temperature source Oral, resp. rate 18, height 5\' 1"  (1.549 m), weight 52.2 kg (115 lb), SpO2 98 %.  PHYSICAL EXAMINATION:  GENERAL:  83 y.o.-year-old patient lying in the bed with no acute distress.  EYES: Pupils equal, round, reactive to light and accommodation. No scleral icterus. Extraocular muscles intact.  HEENT: Head atraumatic, normocephalic. Oropharynx and nasopharynx clear.  NECK:  Supple, no jugular venous distention. No thyroid enlargement,  no tenderness.  LUNGS: Normal breath sounds bilaterally, no wheezing, rales,rhonchi or crepitation. No use of accessory muscles of respiration.  CARDIOVASCULAR: S1, S2 normal. No murmurs, rubs, or gallops.  ABDOMEN: Soft, nontender, nondistended. Bowel sounds present. No organomegaly or mass.  EXTREMITIES: No pedal edema, cyanosis, or clubbing.  NEUROLOGIC: Cranial nerves II through XII are intact. Muscle strength 5/5 in all extremities. Sensation intact. Gait not checked.  PSYCHIATRIC: The patient is alert and oriented x 3.  SKIN: No obvious rash, lesion, or ulcer.    LABORATORY PANEL:   CBC  Recent Labs Lab 08/18/16 1125  WBC 5.2  HGB 11.7*  HCT 34.9*  PLT 283   ------------------------------------------------------------------------------------------------------------------  Chemistries   Recent Labs Lab 08/20/16 0948  NA 137  K 3.5  CL 100*  CO2 29  GLUCOSE 120*  BUN 15  CREATININE 0.98  CALCIUM 9.2  AST 28  ALT 10*  ALKPHOS 55  BILITOT 0.5   ------------------------------------------------------------------------------------------------------------------  Cardiac Enzymes No results for input(s): TROPONINI in the last 168 hours. ------------------------------------------------------------------------------------------------------------------  RADIOLOGY:  No results found.  EKG:   Orders placed or performed during the hospital encounter of 08/07/16  . EKG 12-Lead  . EKG 12-Lead    ASSESSMENT AND PLAN:   1 nausea and vomiting likely due to narcotic medication. Hold narcotics and use Tylenol for pain control, patient is much better, less nauseous today. #2 hypokalemia due to poor by mouth intake and vomiting. Potassium being replaced, repeat potassium improved from 2.8-3.5.  3. status post lumbar laminectomy and 16. Patient can be discharged home today with home physical therapy. Her nausea is better, her potassium is improved.  She medically  stable for  discharge.   All the records are reviewed and case discussed with Care Management/Social Workerr. Management plans discussed with the patient, family and they are in agreement.  CODE STATUS: full  TOTAL TIME TAKING CARE OF THIS PATIENT: 41minutes.   POSSIBLE D/C IN 1 DAYS, DEPENDING ON CLINICAL CONDITION.   Epifanio Lesches M.D on 08/20/2016 at 11:44 AM  Between 7am to 6pm - Pager - [phone]  After 6pm go to [email or web address] - password [email or web address] [facility]  Office  [phone]  CC: Primary care physician; Einar Pheasant, MD   Note: This dictation was prepared with Dragon dictation along with smaller phrase technology. Any transcriptional errors that result from this process are unintentional.

## 2016-08-20 NOTE — Progress Notes (Signed)
Physical Therapy Treatment Patient Details Name: Stacey Snyder MRN: [id number] DOB: 10-15-1932 Today's Date: 08/20/2016    History of Present Illness 83 y.o. female s/p lumbar laminectomy 08/18/16. PMHx significant for diverticulosis, fibrocystic breast disease, HTN, HLD, IBS, arthritis, osteopenia, nephrolithiasis, RA with hx of lumbar lamenectomy  (1983).     PT Comments    Pt agreeable to PT; reports 9 pain in back. Pt is noted to be quite lively, talkative and laughing throughout session with occasional grimaces. Pt moving well and with good speed with all mobility. Pt requires Min guard at the most for mobility and functional activity. Pt is impulsive and quick to move before receiving instructions. Mild safety concerns with hand placement with sit to/from stand transfers. Pt received up in chair comfortably. Continue PT to progress strength especially core and endurance to improve all functional mobility.    Follow Up Recommendations  Home health PT     Equipment Recommendations  Rolling walker with 5" wheels    Recommendations for Other Services       Precautions / Restrictions Precautions Precautions: Back Restrictions Weight Bearing Restrictions: No    Mobility  Bed Mobility Overal bed mobility: Modified Independent Bed Mobility: Supine to Sit Rolling: Modified independent (Device/Increase time) Sidelying to sit: Modified independent (Device/Increase time)       General bed mobility comments: Moving well; use of rails  Transfers Overall transfer level: Modified independent Equipment used: Rolling walker (2 wheeled) Transfers: Sit to/from Stand Sit to Stand: Supervision         General transfer comment: cues for hand placement. Performs to/from bed, commode and recliner  Ambulation/Gait Ambulation/Gait assistance: Min guard Ambulation Distance (Feet): 25 Feet (50 ft x 2) Assistive device: Rolling walker (2 wheeled) Gait Pattern/deviations:  Step-through pattern;Decreased stride length   Gait velocity interpretation: at or above normal speed for age/gender General Gait Details: Good speed, no LOB or buckling, but pt subjectively complains BLEs feel weak   Stairs            Wheelchair Mobility    Modified Rankin (Stroke Patients Only)       Balance Overall balance assessment: No apparent balance deficits (not formally assessed)                                          Cognition Arousal/Alertness: Awake/alert Behavior During Therapy: Impulsive Overall Cognitive Status: Within Functional Limits for tasks assessed                                 General Comments: Does not wait for instruction or follow too well      Exercises General Exercises - Lower Extremity Ankle Circles/Pumps: AROM;Both;10 reps Quad Sets: Strengthening;Both;10 reps Gluteal Sets: Strengthening;Both;10 reps Long Arc Quad: AROM;Both;10 reps;Seated Heel Slides: AROM;Both;10 reps Hip Flexion/Marching: AROM;Both;10 reps;Seated Toe Raises: AROM;Both;10 reps;Seated Heel Raises: AROM;Both;10 reps;Seated Other Exercises Other Exercises: set up for toileting. Min guard with donning underwear    General Comments        Pertinent Vitals/Pain Pain Assessment: 0-10 Pain Score: 9  (pt talkative/laughing, mostly with occasional grimace) Pain Location: back Pain Intervention(s): Repositioned    Home Living                      Prior Function  PT Goals (current goals can now be found in the care plan section)      Frequency    7X/week      PT Plan Current plan remains appropriate    Co-evaluation             End of Session Equipment Utilized During Treatment: Gait belt Activity Tolerance: Patient tolerated treatment well Patient left: in chair;with call bell/phone within reach;with family/visitor present (refuses alarm)   PT Visit Diagnosis: Muscle weakness  (generalized) (M62.81);Difficulty in walking, not elsewhere classified (R26.2)     Time: 6950-7225 PT Time Calculation (min) (ACUTE ONLY): 40 min  Charges:  $Gait Training: 8-22 mins $Therapeutic Exercise: 8-22 mins $Therapeutic Activity: 8-22 mins                    G Codes:          Larae Grooms, PTA 08/20/2016, 11:31 AM

## 2016-08-20 NOTE — Progress Notes (Signed)
    Attending Progress Note  History: Faige Seely is here for lumbar spondylosis. She stayed overnight yesterday due to nausea and vomiting, which has resolved  Physical Exam: Vitals:   08/20/16 0719 08/20/16 1018  BP: (!) 157/59   Pulse: 83 85  Resp: 18   Temp: 99.8 F (37.7 C)     AA Ox3 CNI  Strength:5/5 throughout BLE Incision c/d/i  Data:   Recent Labs Lab 08/19/16 1617 08/20/16 0948  NA 131* 137  K 2.8* 3.5  CL 96* 100*  CO2 30 29  BUN 18 15  CREATININE 0.73 0.98  GLUCOSE 157* 120*  CALCIUM 8.5* 9.2    Recent Labs Lab 08/20/16 0948  AST 28  ALT 10*  ALKPHOS 55      Recent Labs Lab 08/18/16 1125  WBC 5.2  HGB 11.7*  HCT 34.9*  PLT 283   No results for input(s): APTT, INR in the last 168 hours.       Other tests/results: none  Assessment/Plan:  [name] is diong well on POD2 now with nausea resolved. Discharge planning.   Meade Maw MD, [facility] Department of Neurosurgery

## 2016-08-20 NOTE — Progress Notes (Signed)
CONCERNING: IV to Oral Route Change Policy  RECOMMENDATION: This patient is receiving pantoprazole by the intravenous route.  Based on criteria approved by the Pharmacy and Therapeutics Committee, the intravenous medication(s) is/are being converted to the equivalent oral dose form(s).   DESCRIPTION: These criteria include:  The patient is eating (either orally or via tube) and/or has been taking other orally administered medications for a least 24 hours  The patient has no evidence of active gastrointestinal bleeding or impaired GI absorption (gastrectomy, short bowel, patient on TNA or NPO).  If you have questions about this conversion, please contact the Pharmacy Department  []   [phone] )  Stacey Snyder [x]   [phone] )  [facility] []   [phone] )  [facility] []   [phone] )  [facility] []   [phone] )  [place], [facility] 08/20/2016 9:13 AM

## 2016-08-20 NOTE — Discharge Instructions (Signed)
Your surgeon has performed an operation on your lumbar spine (low back) to relieve pressure on one or more nerves. Many times, patients feel better immediately after surgery and can overdo it. Even if you feel well, it is important that you follow these activity guidelines. If you do not let your back heal properly from the surgery, you can increase the chance of a disc herniation and return of your symptoms. The following are instructions to help in your recovery once you have been discharged from the hospital.  * Do not take anti-inflammatory medications for 5 days after surgery (naproxen [Aleve], ibuprofen [Advil, Motrin], celecoxib [Celebrex], etc.)  Activity    No bending, lifting, or twisting (BLT). Avoid lifting objects heavier than 10 pounds (gallon milk jug).  Where possible, avoid household activities that involve lifting, bending, pushing, or pulling such as laundry, vacuuming, grocery shopping, and childcare. Try to arrange for help from friends and family for these activities while your back heals.  Increase physical activity slowly as tolerated.  Taking short walks is encouraged, but avoid strenuous exercise. Do not jog, run, bicycle, lift weights, or participate in any other exercises unless specifically allowed by your doctor. Avoid prolonged sitting, including car rides.  Talk to your doctor before resuming sexual activity.  You should not drive until cleared by your doctor.  Until released by your doctor, you should not return to work or school.  You should rest at home and let your body heal.   You may shower three days after your surgery.  After showering, lightly dab your incision dry. Do not take a tub bath or go swimming until approved by your doctor at your follow-up appointment.  If you smoke, we strongly recommend that you quit.  Smoking has been proven to interfere with normal healing in your back and will dramatically reduce the success rate of your surgery. Please  contact [facility] (800-QUIT-NOW) and use the resources at www.[facility].com for assistance in stopping smoking.  Surgical Incision   If you have a dressing on your incision, you may remove it two days after your surgery. Keep your incision area clean and dry.  If you have staples or stitches on your incision, you should have a follow up scheduled for removal. If you do not have staples or stitches, you will have steri-strips (small pieces of surgical tape) or Dermabond glue. The steri-strips/glue should begin to peel away within about a week (it is fine if the steri-strips fall off before then). If the strips are still in place one week after your surgery, you may gently remove them.  Diet            You may return to your usual diet. Be sure to stay hydrated.  When to Contact us  Although your surgery and recovery will likely be uneventful, you may have some residual numbness, aches, and pains in your back and/or legs. This is normal and should improve in the next few weeks.  However, should you experience any of the following, contact us immediately:  New numbness or weakness  Pain that is progressively getting worse, and is not relieved by your pain medications or rest  Bleeding, redness, swelling, pain, or drainage from surgical incision  Chills or flu-like symptoms  Fever greater than 101.0 F (38.3 C)  Problems with bowel or bladder functions  Difficulty breathing or shortness of breath  Warmth, tenderness, or swelling in your calf  Contact Information  During office hours (Monday-Friday 9 am to 5  pm), please call your physician at [phone]  After hours and weekends, please call the [place] Operator at [phone] and ask for the Neurosurgery Resident On Call   For a life-threatening emergency, call 911

## 2016-08-20 NOTE — Progress Notes (Signed)
DISCHARGE NOTE:  Pt given discharge instructions and prescriptions. Pt verbalized understanding. Pt wheeled to car by staff.  

## 2016-08-21 DIAGNOSIS — Z4789 Encounter for other orthopedic aftercare: Secondary | ICD-10-CM | POA: Diagnosis not present

## 2016-08-21 DIAGNOSIS — M6281 Muscle weakness (generalized): Secondary | ICD-10-CM | POA: Diagnosis not present

## 2016-08-21 DIAGNOSIS — M0600 Rheumatoid arthritis without rheumatoid factor, unspecified site: Secondary | ICD-10-CM | POA: Diagnosis not present

## 2016-08-21 DIAGNOSIS — R2689 Other abnormalities of gait and mobility: Secondary | ICD-10-CM | POA: Diagnosis not present

## 2016-08-21 DIAGNOSIS — I10 Essential (primary) hypertension: Secondary | ICD-10-CM | POA: Diagnosis not present

## 2016-08-23 DIAGNOSIS — M0600 Rheumatoid arthritis without rheumatoid factor, unspecified site: Secondary | ICD-10-CM | POA: Diagnosis not present

## 2016-08-23 DIAGNOSIS — Z4789 Encounter for other orthopedic aftercare: Secondary | ICD-10-CM | POA: Diagnosis not present

## 2016-08-23 DIAGNOSIS — M6281 Muscle weakness (generalized): Secondary | ICD-10-CM | POA: Diagnosis not present

## 2016-08-23 DIAGNOSIS — I10 Essential (primary) hypertension: Secondary | ICD-10-CM | POA: Diagnosis not present

## 2016-08-23 DIAGNOSIS — R2689 Other abnormalities of gait and mobility: Secondary | ICD-10-CM | POA: Diagnosis not present

## 2016-08-25 DIAGNOSIS — M6281 Muscle weakness (generalized): Secondary | ICD-10-CM | POA: Diagnosis not present

## 2016-08-25 DIAGNOSIS — I10 Essential (primary) hypertension: Secondary | ICD-10-CM | POA: Diagnosis not present

## 2016-08-25 DIAGNOSIS — R2689 Other abnormalities of gait and mobility: Secondary | ICD-10-CM | POA: Diagnosis not present

## 2016-08-25 DIAGNOSIS — M0600 Rheumatoid arthritis without rheumatoid factor, unspecified site: Secondary | ICD-10-CM | POA: Diagnosis not present

## 2016-08-25 DIAGNOSIS — Z4789 Encounter for other orthopedic aftercare: Secondary | ICD-10-CM | POA: Diagnosis not present

## 2016-08-27 DIAGNOSIS — M6281 Muscle weakness (generalized): Secondary | ICD-10-CM | POA: Diagnosis not present

## 2016-08-27 DIAGNOSIS — R2689 Other abnormalities of gait and mobility: Secondary | ICD-10-CM | POA: Diagnosis not present

## 2016-08-27 DIAGNOSIS — I10 Essential (primary) hypertension: Secondary | ICD-10-CM | POA: Diagnosis not present

## 2016-08-27 DIAGNOSIS — M0600 Rheumatoid arthritis without rheumatoid factor, unspecified site: Secondary | ICD-10-CM | POA: Diagnosis not present

## 2016-08-27 DIAGNOSIS — Z4789 Encounter for other orthopedic aftercare: Secondary | ICD-10-CM | POA: Diagnosis not present

## 2016-08-30 DIAGNOSIS — R3915 Urgency of urination: Secondary | ICD-10-CM | POA: Diagnosis not present

## 2016-08-30 DIAGNOSIS — R21 Rash and other nonspecific skin eruption: Secondary | ICD-10-CM | POA: Diagnosis not present

## 2016-09-01 DIAGNOSIS — I10 Essential (primary) hypertension: Secondary | ICD-10-CM | POA: Diagnosis not present

## 2016-09-01 DIAGNOSIS — R2689 Other abnormalities of gait and mobility: Secondary | ICD-10-CM | POA: Diagnosis not present

## 2016-09-01 DIAGNOSIS — M6281 Muscle weakness (generalized): Secondary | ICD-10-CM | POA: Diagnosis not present

## 2016-09-01 DIAGNOSIS — M0600 Rheumatoid arthritis without rheumatoid factor, unspecified site: Secondary | ICD-10-CM | POA: Diagnosis not present

## 2016-09-01 DIAGNOSIS — Z4789 Encounter for other orthopedic aftercare: Secondary | ICD-10-CM | POA: Diagnosis not present

## 2016-09-03 DIAGNOSIS — M0600 Rheumatoid arthritis without rheumatoid factor, unspecified site: Secondary | ICD-10-CM | POA: Diagnosis not present

## 2016-09-03 DIAGNOSIS — R2689 Other abnormalities of gait and mobility: Secondary | ICD-10-CM | POA: Diagnosis not present

## 2016-09-03 DIAGNOSIS — Z4789 Encounter for other orthopedic aftercare: Secondary | ICD-10-CM | POA: Diagnosis not present

## 2016-09-03 DIAGNOSIS — I10 Essential (primary) hypertension: Secondary | ICD-10-CM | POA: Diagnosis not present

## 2016-09-03 DIAGNOSIS — M6281 Muscle weakness (generalized): Secondary | ICD-10-CM | POA: Diagnosis not present

## 2016-09-16 DIAGNOSIS — M0579 Rheumatoid arthritis with rheumatoid factor of multiple sites without organ or systems involvement: Secondary | ICD-10-CM | POA: Diagnosis not present

## 2016-09-16 DIAGNOSIS — R21 Rash and other nonspecific skin eruption: Secondary | ICD-10-CM | POA: Diagnosis not present

## 2016-09-16 DIAGNOSIS — M778 Other enthesopathies, not elsewhere classified: Secondary | ICD-10-CM | POA: Diagnosis not present

## 2016-10-01 ENCOUNTER — Other Ambulatory Visit: Payer: Self-pay | Admitting: Internal Medicine

## 2016-10-13 ENCOUNTER — Other Ambulatory Visit: Payer: Self-pay | Admitting: Internal Medicine

## 2016-10-23 ENCOUNTER — Other Ambulatory Visit: Payer: Self-pay

## 2016-10-23 MED ORDER — LOVASTATIN 40 MG PO TABS: 40.0000 mg | ORAL_TABLET | Freq: Every day | ORAL | 0 refills | Status: DC

## 2016-10-28 DIAGNOSIS — M25531 Pain in right wrist: Secondary | ICD-10-CM | POA: Diagnosis not present

## 2016-10-28 DIAGNOSIS — M0579 Rheumatoid arthritis with rheumatoid factor of multiple sites without organ or systems involvement: Secondary | ICD-10-CM | POA: Diagnosis not present

## 2016-10-28 DIAGNOSIS — Z79899 Other long term (current) drug therapy: Secondary | ICD-10-CM | POA: Diagnosis not present

## 2016-10-29 ENCOUNTER — Telehealth: Payer: Self-pay | Admitting: *Deleted

## 2016-10-29 NOTE — Telephone Encounter (Signed)
[facility] requested a medication refill for lovastatin

## 2016-10-30 ENCOUNTER — Other Ambulatory Visit: Payer: Self-pay

## 2016-10-30 MED ORDER — LOVASTATIN 40 MG PO TABS: 40.0000 mg | ORAL_TABLET | Freq: Every day | ORAL | 0 refills | Status: DC

## 2016-10-30 NOTE — Telephone Encounter (Signed)
Re-sent to mail order

## 2016-10-31 ENCOUNTER — Encounter: Payer: Medicare Other | Admitting: Internal Medicine

## 2016-11-03 ENCOUNTER — Telehealth: Payer: Self-pay | Admitting: Internal Medicine

## 2016-11-03 NOTE — Telephone Encounter (Signed)
Declined to reschedule AWV at this time.

## 2016-11-19 ENCOUNTER — Encounter: Payer: Self-pay | Admitting: Internal Medicine

## 2016-11-19 ENCOUNTER — Ambulatory Visit (INDEPENDENT_AMBULATORY_CARE_PROVIDER_SITE_OTHER): Payer: Medicare Other | Admitting: Internal Medicine

## 2016-11-19 VITALS — BP 142/52 | HR 60 | Temp 98.2°F | Resp 16 | Ht 61.0 in | Wt 119.6 lb

## 2016-11-19 DIAGNOSIS — Z1231 Encounter for screening mammogram for malignant neoplasm of breast: Secondary | ICD-10-CM | POA: Diagnosis not present

## 2016-11-19 DIAGNOSIS — Z0000 Encounter for general adult medical examination without abnormal findings: Secondary | ICD-10-CM | POA: Diagnosis not present

## 2016-11-19 DIAGNOSIS — K219 Gastro-esophageal reflux disease without esophagitis: Secondary | ICD-10-CM | POA: Diagnosis not present

## 2016-11-19 DIAGNOSIS — M48061 Spinal stenosis, lumbar region without neurogenic claudication: Secondary | ICD-10-CM

## 2016-11-19 DIAGNOSIS — M1990 Unspecified osteoarthritis, unspecified site: Secondary | ICD-10-CM

## 2016-11-19 DIAGNOSIS — F439 Reaction to severe stress, unspecified: Secondary | ICD-10-CM

## 2016-11-19 DIAGNOSIS — E7800 Pure hypercholesterolemia, unspecified: Secondary | ICD-10-CM | POA: Diagnosis not present

## 2016-11-19 DIAGNOSIS — I10 Essential (primary) hypertension: Secondary | ICD-10-CM

## 2016-11-19 DIAGNOSIS — Z1239 Encounter for other screening for malignant neoplasm of breast: Secondary | ICD-10-CM

## 2016-11-19 MED ORDER — LOVASTATIN 40 MG PO TABS: 40.0000 mg | ORAL_TABLET | Freq: Every day | ORAL | 0 refills | Status: DC

## 2016-11-19 NOTE — Progress Notes (Signed)
Patient ID: Stacey Snyder, female   DOB: 07-May-1932, 83 y.o.   MRN: [id number]   Subjective:    Patient ID: Stacey Snyder, female    DOB: 02-17-1933, 83 y.o.   MRN: [id number]  HPI  Patient here for a scheduled follow up.  She is s/p lumbar laminectomy 08/18/16.  Doing well.  No pain.  Stays active.  Eating well.  No chest pain.  No sob.  No acid reflux.  No abdominal pain or cramping.  Bowels stable.  Flare with arthritis.  Right wrist has been following her.  Has been on prednisone.  Off for the last week.  Seeing Dr Jefm Bryant.  Overall she feels she is doing well.     Past Medical History:  Diagnosis Date  . Diverticulosis   . Fibrocystic breast disease   . GERD (gastroesophageal reflux disease)   . History of kidney stones   . Hypercholesterolemia   . Hypertension   . Hypertension   . IBS (irritable bowel syndrome)   . Inflammatory arthritis    positive anti-CCP abs, s/p prednisone, MTX, Remicade  . Nephrolithiasis   . Osteopenia    GI upset with Fosamax  . Pancreatitis 2007   s/p ERCP  . Pancreatitis   . PONV (postoperative nausea and vomiting)   . Renal insufficiency   . Spinal stenosis of lumbosacral region    Past Surgical History:  Procedure Laterality Date  . ABDOMINAL HYSTERECTOMY  1993   ovaries not removed  . BREAST BIOPSY Left 1984   negative  . BREAST CYST EXCISION Left 2010   negative  . LITHOTRIPSY    . LUMBAR LAMINECTOMY  1983  . LUMBAR LAMINECTOMY/DECOMPRESSION MICRODISCECTOMY N/A 08/18/2016   Procedure: LUMBAR LAMINECTOMY/DECOMPRESSION MICRODISCECTOMY 1 LEVEL;  Surgeon: Bayard Hugger, MD;  Location: [facility] ORS;  Service: Neurosurgery;  Laterality: N/A;  L4-5 Laminectomy, MIS, L5 foraminotomy  . TRIGGER FINGER RELEASE     right ring finger   Family History  Problem Relation Age of Onset  . Ovarian cancer Mother   . Renal Disease Mother   . Heart disease Mother   . Diabetes Mother   . Hypertension Mother    Social History   Social  History  . Marital status: Married    Spouse name: N/A  . Number of children: 4  . Years of education: N/A   Social History Main Topics  . Smoking status: Never Smoker  . Smokeless tobacco: Never Used  . Alcohol use No  . Drug use: No  . Sexual activity: Not Asked   Other Topics Concern  . None   Social History Narrative  . None    Outpatient Encounter Prescriptions as of 11/19/2016  Medication Sig  . baclofen (LIORESAL) 10 MG tablet Take 1 tablet (10 mg total) by mouth 3 (three) times daily as needed for muscle spasms.  . carisoprodol (SOMA) 350 MG tablet Take 1 tablet (350 mg total) by mouth 3 (three) times daily.  . Cholecalciferol (VITAMIN D) 2000 UNITS tablet Take 2,000 Units by mouth daily.  Marland Kitchen diltiazem (DILACOR XR) 240 MG 24 hr capsule TAKE 1 CAPSULE DAILY  . fluticasone (FLONASE) 50 MCG/ACT nasal spray USE TWO SPRAY(S) IN EACH NOSTRIL ONCE DAILY (Patient taking differently: Place 2 sprays into both nostrils daily as needed for allergies. )  . folic acid (FOLVITE) 1 MG tablet Take 1 mg by mouth 2 (two) times daily.   Marland Kitchen gabapentin (NEURONTIN) 300 MG capsule Take 300 mg by mouth at  bedtime.   Marland Kitchen HYDROcodone-acetaminophen (NORCO) 7.5-325 MG tablet Take 1-2 tablets by mouth every 6 (six) hours as needed for moderate pain or severe pain.  . InFLIXimab (REMICADE IV) Inject into the vein every 6 (six) weeks. Per Dr Jefm Bryant  . losartan (COZAAR) 100 MG tablet TAKE 1 TABLET DAILY  . lovastatin (MEVACOR) 40 MG tablet Take 1 tablet (40 mg total) by mouth daily.  . methotrexate 25 MG/ML SOLN Inject 17.5 mg into the skin once a week. Patient takes 17.5 mg (0.7 ml) on Monday or Tuesday of each week.  Marland Kitchen omeprazole (PRILOSEC) 20 MG capsule TAKE 1 CAPSULE DAILY  . ondansetron (ZOFRAN) 4 MG tablet Take 1 tablet (4 mg total) by mouth every 6 (six) hours as needed for nausea or vomiting.  . polyethylene glycol (MIRALAX / GLYCOLAX) packet Take 17 g by mouth daily.  . psyllium (METAMUCIL) 58.6 %  packet Take 1 packet by mouth daily.  . traMADol (ULTRAM) 50 MG tablet Take 1-2 tablets by mouth every 6 hours as needed for moderate to severe pain  . triamterene-hydrochlorothiazide (MAXZIDE-25) 37.5-25 MG tablet TAKE 1 TABLET DAILY  . [DISCONTINUED] lovastatin (MEVACOR) 40 MG tablet Take 1 tablet (40 mg total) by mouth daily.  . [DISCONTINUED] acyclovir ointment (ZOVIRAX) 5 % Apply 1 application topically 4 (four) times daily as needed (fever blister). Apply to lip (Patient not taking: Reported on 08/18/2016)   No facility-administered encounter medications on file as of 11/19/2016.     Review of Systems  Constitutional: Negative for appetite change and unexpected weight change.  HENT: Negative for congestion and sinus pressure.   Respiratory: Negative for cough, chest tightness and shortness of breath.   Cardiovascular: Negative for chest pain, palpitations and leg swelling.  Gastrointestinal: Negative for abdominal pain, diarrhea, nausea and vomiting.  Genitourinary: Negative for difficulty urinating and dysuria.  Musculoskeletal:       Back is doing better.  Flare with her wrist.    Skin: Negative for color change and rash.  Neurological: Negative for dizziness, light-headedness and headaches.  Psychiatric/Behavioral: Negative for agitation and dysphoric mood.       Objective:    Physical Exam  Constitutional: She appears well-developed and well-nourished. No distress.  HENT:  Nose: Nose normal.  Mouth/Throat: Oropharynx is clear and moist.  Neck: Neck supple. No thyromegaly present.  Cardiovascular: Normal rate and regular rhythm.   Pulmonary/Chest: Breath sounds normal. No respiratory distress. She has no wheezes.  Abdominal: Soft. Bowel sounds are normal. There is no tenderness.  Musculoskeletal: She exhibits no edema or tenderness.  Lymphadenopathy:    She has no cervical adenopathy.  Skin: No rash noted. No erythema.  Psychiatric: She has a normal mood and affect. Her  behavior is normal.    BP (!) 142/52   Pulse 60   Temp 98.2 F (36.8 C) (Oral)   Resp 16   Ht 5\' 1"  (1.549 m)   Wt 119 lb 9.6 oz (54.3 kg)   SpO2 98%   BMI 22.60 kg/m  Wt Readings from Last 3 Encounters:  11/19/16 119 lb 9.6 oz (54.3 kg)  08/18/16 115 lb (52.2 kg)  08/07/16 109 lb (49.4 kg)     Lab Results  Component Value Date   WBC 5.2 08/18/2016   HGB 11.7 (L) 08/18/2016   HCT 34.9 (L) 08/18/2016   PLT 283 08/18/2016   GLUCOSE 120 (H) 08/20/2016   CHOL 163 04/18/2015   TRIG 166.0 (H) 04/18/2015   HDL 69.70 04/18/2015  LDLCALC 60 04/18/2015   ALT 10 (L) 08/20/2016   AST 28 08/20/2016   NA 137 08/20/2016   K 3.5 08/20/2016   CL 100 (L) 08/20/2016   CREATININE 0.98 08/20/2016   BUN 15 08/20/2016   CO2 29 08/20/2016   TSH 1.05 04/18/2015   INR 0.92 08/07/2016    Mr Cervical Spine Wo Contrast  Result Date: 08/08/2016 CLINICAL DATA:  No headaches, no known injury. Left arm pain and numbness. EXAM: MRI CERVICAL SPINE WITHOUT CONTRAST TECHNIQUE: Multiplanar, multisequence MR imaging of the cervical spine was performed. No intravenous contrast was administered. COMPARISON:  None. FINDINGS: Patient motion degrades image quality limiting evaluation. Alignment: Physiologic. Vertebrae: No fracture, evidence of discitis, or bone lesion. Cord: Normal signal and morphology. Posterior Fossa, vertebral arteries, paraspinal tissues: Negative. Disc levels: Discs: Disc spaces are preserved. C2-3: No significant disc bulge. No neural foraminal stenosis. No central canal stenosis. C3-4: No significant disc bulge. No neural foraminal stenosis. No central canal stenosis. C4-5: No significant disc bulge. Mild right facet arthropathy and mild right foraminal narrowing. No left foraminal narrowing. No central canal stenosis. C5-6: Central disc protrusion contacting the ventral cervical spinal cord. No neural foraminal stenosis. No central canal stenosis. C6-7: No significant disc bulge. No neural  foraminal stenosis. No central canal stenosis. C7-T1: No significant disc bulge. No neural foraminal stenosis. No central canal stenosis. IMPRESSION: 1. At C5-6 there is a central disc protrusion contacting the ventral cervical spinal cord. No foraminal or central canal stenosis. Electronically Signed   By: Kathreen Devoid   On: 08/08/2016 12:14       Assessment & Plan:   Problem List Items Addressed This Visit    GERD (gastroesophageal reflux disease)    Upper symptoms controlled on omeprazole.        Health care maintenance    Physical today 11/19/16.  Mammogram       Hypercholesterolemia    On lovastatin.  Low cholesterol diet and exercise.  Follow lipid panel and liver function tests.        Relevant Medications   lovastatin (MEVACOR) 40 MG tablet   Other Relevant Orders   Hepatic function panel   Lipid panel   Hypertension    Blood pressure elevated here.  Will have her spot check her pressure.  Follow.  Hold on changing medication at this time.        Relevant Medications   lovastatin (MEVACOR) 40 MG tablet   Other Relevant Orders   TSH   Basic metabolic panel   Inflammatory arthritis ([place])    Followed by Dr Jefm Bryant.  Given recent surgery - has been off remicade.  Recent flare.  Has been on prednisone.  Off now.       Lumbar stenosis    S/p lumbar laminectomy.  Doing well.  Pain better.        Stress    Has been under increased stress.  Currently stable.  Follow.         Other Visit Diagnoses    Breast cancer screening    -  Primary   Relevant Orders   MM DIGITAL SCREENING BILATERAL       Einar Pheasant, MD

## 2016-11-19 NOTE — Assessment & Plan Note (Signed)
Physical today 11/19/16.  Mammogram

## 2016-11-21 ENCOUNTER — Encounter: Payer: Self-pay | Admitting: Internal Medicine

## 2016-11-21 NOTE — Assessment & Plan Note (Signed)
Blood pressure elevated here.  Will have her spot check her pressure.  Follow.  Hold on changing medication at this time.

## 2016-11-21 NOTE — Assessment & Plan Note (Signed)
Upper symptoms controlled on omeprazole.  

## 2016-11-21 NOTE — Assessment & Plan Note (Signed)
On lovastatin.  Low cholesterol diet and exercise.  Follow lipid panel and liver function tests.   

## 2016-11-21 NOTE — Assessment & Plan Note (Signed)
Has been under increased stress.  Currently stable.  Follow.

## 2016-11-21 NOTE — Assessment & Plan Note (Signed)
Followed by Dr Jefm Bryant.  Given recent surgery - has been off remicade.  Recent flare.  Has been on prednisone.  Off now.

## 2016-11-21 NOTE — Assessment & Plan Note (Signed)
S/p lumbar laminectomy.  Doing well.  Pain better.

## 2016-12-09 DIAGNOSIS — M0579 Rheumatoid arthritis with rheumatoid factor of multiple sites without organ or systems involvement: Secondary | ICD-10-CM | POA: Diagnosis not present

## 2016-12-09 DIAGNOSIS — M48061 Spinal stenosis, lumbar region without neurogenic claudication: Secondary | ICD-10-CM | POA: Diagnosis not present

## 2016-12-09 DIAGNOSIS — Z9889 Other specified postprocedural states: Secondary | ICD-10-CM | POA: Diagnosis not present

## 2016-12-11 ENCOUNTER — Other Ambulatory Visit (INDEPENDENT_AMBULATORY_CARE_PROVIDER_SITE_OTHER): Payer: Medicare Other

## 2016-12-11 ENCOUNTER — Other Ambulatory Visit: Payer: Self-pay | Admitting: Internal Medicine

## 2016-12-11 DIAGNOSIS — E7800 Pure hypercholesterolemia, unspecified: Secondary | ICD-10-CM | POA: Diagnosis not present

## 2016-12-11 DIAGNOSIS — I10 Essential (primary) hypertension: Secondary | ICD-10-CM | POA: Diagnosis not present

## 2016-12-11 DIAGNOSIS — R7989 Other specified abnormal findings of blood chemistry: Secondary | ICD-10-CM

## 2016-12-11 LAB — HEPATIC FUNCTION PANEL
ALBUMIN: 4.2 g/dL (ref 3.5–5.2)
ALK PHOS: 68 U/L (ref 39–117)
ALT: 12 U/L (ref 0–35)
AST: 21 U/L (ref 0–37)
Bilirubin, Direct: 0.1 mg/dL (ref 0.0–0.3)
Total Bilirubin: 0.5 mg/dL (ref 0.2–1.2)
Total Protein: 7.1 g/dL (ref 6.0–8.3)

## 2016-12-11 LAB — LIPID PANEL
CHOLESTEROL: 261 mg/dL — AB (ref 0–200)
HDL: 65.5 mg/dL (ref >39.00)
LDL Cholesterol: 167 mg/dL — ABNORMAL HIGH (ref 0–99)
NONHDL: 195.05
Total CHOL/HDL Ratio: 4
Triglycerides: 140 mg/dL (ref 0.0–149.0)
VLDL: 28 mg/dL (ref 0.0–40.0)

## 2016-12-11 LAB — BASIC METABOLIC PANEL
BUN: 28 mg/dL — ABNORMAL HIGH (ref 6–23)
CHLORIDE: 99 meq/L (ref 96–112)
CO2: 32 meq/L (ref 19–32)
Calcium: 9.7 mg/dL (ref 8.4–10.5)
Creatinine, Ser: 1.15 mg/dL (ref 0.40–1.20)
GFR: 48.05 mL/min — ABNORMAL LOW (ref >60.00)
GLUCOSE: 101 mg/dL — AB (ref 70–99)
POTASSIUM: 3.5 meq/L (ref 3.5–5.1)
SODIUM: 136 meq/L (ref 135–145)

## 2016-12-11 LAB — TSH: TSH: 2.12 u[IU]/mL (ref 0.35–4.50)

## 2016-12-11 NOTE — Progress Notes (Signed)
Order placed for f/u met b.  

## 2016-12-18 ENCOUNTER — Ambulatory Visit
Admission: RE | Admit: 2016-12-18 | Discharge: 2016-12-18 | Disposition: A | Discharge status: Home / Self Care | Payer: Medicare Other | Source: Ambulatory Visit | Attending: Internal Medicine | Admitting: Internal Medicine

## 2016-12-18 DIAGNOSIS — Z1231 Encounter for screening mammogram for malignant neoplasm of breast: Secondary | ICD-10-CM | POA: Diagnosis not present

## 2016-12-18 DIAGNOSIS — Z1239 Encounter for other screening for malignant neoplasm of breast: Secondary | ICD-10-CM

## 2016-12-22 ENCOUNTER — Other Ambulatory Visit (INDEPENDENT_AMBULATORY_CARE_PROVIDER_SITE_OTHER): Payer: Medicare Other

## 2016-12-22 DIAGNOSIS — R7989 Other specified abnormal findings of blood chemistry: Secondary | ICD-10-CM | POA: Diagnosis not present

## 2016-12-22 LAB — BASIC METABOLIC PANEL
BUN: 24 mg/dL — AB (ref 6–23)
CHLORIDE: 95 meq/L — AB (ref 96–112)
CO2: 29 mEq/L (ref 19–32)
Calcium: 9.5 mg/dL (ref 8.4–10.5)
Creatinine, Ser: 1.17 mg/dL (ref 0.40–1.20)
GFR: 47.1 mL/min — AB (ref >60.00)
Glucose, Bld: 93 mg/dL (ref 70–99)
POTASSIUM: 3.8 meq/L (ref 3.5–5.1)
SODIUM: 132 meq/L — AB (ref 135–145)

## 2016-12-23 ENCOUNTER — Telehealth: Payer: Self-pay | Admitting: Internal Medicine

## 2016-12-23 NOTE — Telephone Encounter (Signed)
Pt called back requesting lab results. Please advise, thank you!  Call pt @ [phone]

## 2016-12-23 NOTE — Telephone Encounter (Signed)
See lab results notes spoke to patient.

## 2016-12-30 ENCOUNTER — Other Ambulatory Visit: Payer: Self-pay | Admitting: Internal Medicine

## 2016-12-30 ENCOUNTER — Telehealth: Payer: Self-pay | Admitting: *Deleted

## 2016-12-30 DIAGNOSIS — E871 Hypo-osmolality and hyponatremia: Secondary | ICD-10-CM

## 2016-12-30 NOTE — Telephone Encounter (Signed)
Please place future lab orders for upcoming lab appt. 

## 2016-12-30 NOTE — Progress Notes (Signed)
Orders placed for f/u sodium.

## 2016-12-30 NOTE — Telephone Encounter (Signed)
Order placed for f/u sodium.  ?

## 2016-12-31 ENCOUNTER — Other Ambulatory Visit: Payer: Self-pay | Admitting: Internal Medicine

## 2017-01-02 ENCOUNTER — Other Ambulatory Visit (INDEPENDENT_AMBULATORY_CARE_PROVIDER_SITE_OTHER): Payer: Medicare Other

## 2017-01-02 DIAGNOSIS — E871 Hypo-osmolality and hyponatremia: Secondary | ICD-10-CM

## 2017-01-02 LAB — SODIUM: SODIUM: 134 meq/L — AB (ref 135–145)

## 2017-01-14 ENCOUNTER — Ambulatory Visit (INDEPENDENT_AMBULATORY_CARE_PROVIDER_SITE_OTHER): Payer: Medicare Other | Admitting: Internal Medicine

## 2017-01-14 ENCOUNTER — Encounter: Payer: Self-pay | Admitting: Internal Medicine

## 2017-01-14 VITALS — BP 130/64 | HR 62 | Temp 98.6°F | Resp 14 | Ht 61.0 in | Wt 120.6 lb

## 2017-01-14 DIAGNOSIS — M48061 Spinal stenosis, lumbar region without neurogenic claudication: Secondary | ICD-10-CM | POA: Diagnosis not present

## 2017-01-14 DIAGNOSIS — M1990 Unspecified osteoarthritis, unspecified site: Secondary | ICD-10-CM | POA: Diagnosis not present

## 2017-01-14 DIAGNOSIS — F439 Reaction to severe stress, unspecified: Secondary | ICD-10-CM

## 2017-01-14 DIAGNOSIS — I10 Essential (primary) hypertension: Secondary | ICD-10-CM | POA: Diagnosis not present

## 2017-01-14 DIAGNOSIS — Z23 Encounter for immunization: Secondary | ICD-10-CM | POA: Diagnosis not present

## 2017-01-14 DIAGNOSIS — K219 Gastro-esophageal reflux disease without esophagitis: Secondary | ICD-10-CM

## 2017-01-14 DIAGNOSIS — K5791 Diverticulosis of intestine, part unspecified, without perforation or abscess with bleeding: Secondary | ICD-10-CM | POA: Diagnosis not present

## 2017-01-14 DIAGNOSIS — E7800 Pure hypercholesterolemia, unspecified: Secondary | ICD-10-CM | POA: Diagnosis not present

## 2017-01-14 DIAGNOSIS — D649 Anemia, unspecified: Secondary | ICD-10-CM | POA: Diagnosis not present

## 2017-01-14 DIAGNOSIS — R634 Abnormal weight loss: Secondary | ICD-10-CM | POA: Diagnosis not present

## 2017-01-14 DIAGNOSIS — E871 Hypo-osmolality and hyponatremia: Secondary | ICD-10-CM | POA: Diagnosis not present

## 2017-01-14 DIAGNOSIS — R0989 Other specified symptoms and signs involving the circulatory and respiratory systems: Secondary | ICD-10-CM | POA: Diagnosis not present

## 2017-01-14 MED ORDER — FLUTICASONE PROPIONATE 50 MCG/ACT NA SUSP: NASAL | 11 refills | Status: DC

## 2017-01-14 MED ORDER — ACYCLOVIR 400 MG PO TABS: ORAL_TABLET | ORAL | 0 refills | Status: DC

## 2017-01-14 MED ORDER — GABAPENTIN 300 MG PO CAPS: 300.0000 mg | ORAL_CAPSULE | Freq: Every day | ORAL | 1 refills | Status: DC

## 2017-01-14 MED ORDER — LOVASTATIN 40 MG PO TABS: 40.0000 mg | ORAL_TABLET | Freq: Every day | ORAL | 1 refills | Status: DC

## 2017-01-14 MED ORDER — DILTIAZEM HCL ER 240 MG PO CP24: 240.0000 mg | ORAL_CAPSULE | Freq: Every day | ORAL | 1 refills | Status: DC

## 2017-01-14 MED ORDER — LOSARTAN POTASSIUM 100 MG PO TABS
100.0000 mg | ORAL_TABLET | Freq: Every day | ORAL | 3 refills | Status: DC
Start: 2017-01-14 — End: 2017-12-02

## 2017-01-14 MED ORDER — TRIAMTERENE-HCTZ 37.5-25 MG PO TABS: 1.0000 | ORAL_TABLET | Freq: Every day | ORAL | 1 refills | Status: DC

## 2017-01-14 MED ORDER — OMEPRAZOLE 20 MG PO CPDR: 20.0000 mg | DELAYED_RELEASE_CAPSULE | Freq: Every day | ORAL | 1 refills | Status: DC

## 2017-01-14 NOTE — Progress Notes (Signed)
Patient ID: Stacey Snyder, female   DOB: 11-29-1932, 83 y.o.   MRN: [id number]   Subjective:    Patient ID: Stacey Snyder, female    DOB: 10/23/1932, 83 y.o.   MRN: [id number]  HPI  Patient here for a scheduled follow up.  Is s/p lumbar laminectomy 08/18/16.  Seeing Dr Aris Lot.  Just evaluated 12/09/16.  Doing well.  Pain is better.  Still some wrist/arm pain.  Request refill of gabapentin.  Feels better.  Eating better.  Weight improved.  No chest pain.  No sob.  No acid reflux.  No abdominal pain or cramping.  Bowels doing well.  Increased stress with caring for her husband.  Overall she feels she is handling things well.     Past Medical History:  Diagnosis Date  . Diverticulosis   . Fibrocystic breast disease   . GERD (gastroesophageal reflux disease)   . History of kidney stones   . Hypercholesterolemia   . Hypertension   . Hypertension   . IBS (irritable bowel syndrome)   . Inflammatory arthritis    positive anti-CCP abs, s/p prednisone, MTX, Remicade  . Nephrolithiasis   . Osteopenia    GI upset with Fosamax  . Pancreatitis 2007   s/p ERCP  . Pancreatitis   . PONV (postoperative nausea and vomiting)   . Renal insufficiency   . Spinal stenosis of lumbosacral region    Past Surgical History:  Procedure Laterality Date  . ABDOMINAL HYSTERECTOMY  1993   ovaries not removed  . BREAST BIOPSY Left 1984   negative  . BREAST CYST EXCISION Left 2010   negative  . LITHOTRIPSY    . LUMBAR LAMINECTOMY  1983  . LUMBAR LAMINECTOMY/DECOMPRESSION MICRODISCECTOMY N/A 08/18/2016   Procedure: LUMBAR LAMINECTOMY/DECOMPRESSION MICRODISCECTOMY 1 LEVEL;  Surgeon: Bayard Hugger, MD;  Location: [facility] ORS;  Service: Neurosurgery;  Laterality: N/A;  L4-5 Laminectomy, MIS, L5 foraminotomy  . TRIGGER FINGER RELEASE     right ring finger   Family History  Problem Relation Age of Onset  . Ovarian cancer Mother   . Renal Disease Mother   . Heart disease Mother   . Diabetes Mother     . Hypertension Mother   . Breast cancer Neg Hx    Social History   Social History  . Marital status: Married    Spouse name: N/A  . Number of children: 4  . Years of education: N/A   Social History Main Topics  . Smoking status: Never Smoker  . Smokeless tobacco: Never Used  . Alcohol use No  . Drug use: No  . Sexual activity: Not Asked   Other Topics Concern  . None   Social History Narrative  . None    Outpatient Encounter Prescriptions as of 01/14/2017  Medication Sig  . baclofen (LIORESAL) 10 MG tablet Take 1 tablet (10 mg total) by mouth 3 (three) times daily as needed for muscle spasms.  . carisoprodol (SOMA) 350 MG tablet Take 1 tablet (350 mg total) by mouth 3 (three) times daily.  . Cholecalciferol (VITAMIN D) 2000 UNITS tablet Take 2,000 Units by mouth daily.  Marland Kitchen diltiazem (DILACOR XR) 240 MG 24 hr capsule Take 1 capsule (240 mg total) by mouth daily.  . fluticasone (FLONASE) 50 MCG/ACT nasal spray USE TWO SPRAY(S) IN EACH NOSTRIL ONCE DAILY  . folic acid (FOLVITE) 1 MG tablet Take 1 mg by mouth 2 (two) times daily.   Marland Kitchen gabapentin (NEURONTIN) 300 MG capsule Take 1 capsule (  300 mg total) by mouth at bedtime.  Marland Kitchen HYDROcodone-acetaminophen (NORCO) 7.5-325 MG tablet Take 1-2 tablets by mouth every 6 (six) hours as needed for moderate pain or severe pain.  . InFLIXimab (REMICADE IV) Inject into the vein every 6 (six) weeks. Per Dr Jefm Bryant  . losartan (COZAAR) 100 MG tablet Take 1 tablet (100 mg total) by mouth daily.  Marland Kitchen lovastatin (MEVACOR) 40 MG tablet Take 1 tablet (40 mg total) by mouth daily.  . methotrexate 25 MG/ML SOLN Inject 17.5 mg into the skin once a week. Patient takes 17.5 mg (0.7 ml) on Monday or Tuesday of each week.  Marland Kitchen omeprazole (PRILOSEC) 20 MG capsule Take 1 capsule (20 mg total) by mouth daily.  . ondansetron (ZOFRAN) 4 MG tablet Take 1 tablet (4 mg total) by mouth every 6 (six) hours as needed for nausea or vomiting.  . polyethylene glycol (MIRALAX /  GLYCOLAX) packet Take 17 g by mouth daily.  . psyllium (METAMUCIL) 58.6 % packet Take 1 packet by mouth daily.  . traMADol (ULTRAM) 50 MG tablet Take 1-2 tablets by mouth every 6 hours as needed for moderate to severe pain  . triamterene-hydrochlorothiazide (MAXZIDE-25) 37.5-25 MG tablet Take 1 tablet by mouth daily.  . [DISCONTINUED] diltiazem (DILACOR XR) 240 MG 24 hr capsule TAKE 1 CAPSULE DAILY  . [DISCONTINUED] fluticasone (FLONASE) 50 MCG/ACT nasal spray USE TWO SPRAY(S) IN EACH NOSTRIL ONCE DAILY (Patient taking differently: Place 2 sprays into both nostrils daily as needed for allergies. )  . [DISCONTINUED] gabapentin (NEURONTIN) 300 MG capsule Take 300 mg by mouth at bedtime.   . [DISCONTINUED] losartan (COZAAR) 100 MG tablet TAKE 1 TABLET DAILY  . [DISCONTINUED] lovastatin (MEVACOR) 40 MG tablet Take 1 tablet (40 mg total) by mouth daily.  . [DISCONTINUED] omeprazole (PRILOSEC) 20 MG capsule TAKE 1 CAPSULE DAILY  . [DISCONTINUED] triamterene-hydrochlorothiazide (MAXZIDE-25) 37.5-25 MG tablet TAKE 1 TABLET DAILY  . [DISCONTINUED] triamterene-hydrochlorothiazide (MAXZIDE-25) 37.5-25 MG tablet TAKE 1 TABLET DAILY  . acyclovir (ZOVIRAX) 400 MG tablet As directed   No facility-administered encounter medications on file as of 01/14/2017.     Review of Systems  Constitutional: Negative for appetite change and unexpected weight change.  HENT: Negative for congestion and sinus pressure.   Respiratory: Negative for cough, chest tightness and shortness of breath.   Cardiovascular: Negative for chest pain, palpitations and leg swelling.  Gastrointestinal: Negative for abdominal pain, diarrhea, nausea and vomiting.  Genitourinary: Negative for difficulty urinating and dysuria.  Musculoskeletal: Negative for back pain and myalgias.  Skin: Negative for color change and rash.  Neurological: Negative for dizziness, light-headedness and headaches.  Psychiatric/Behavioral: Negative for agitation and  dysphoric mood.       Objective:    Physical Exam  Constitutional: She appears well-developed and well-nourished. No distress.  HENT:  Nose: Nose normal.  Mouth/Throat: Oropharynx is clear and moist.  Neck: Neck supple. No thyromegaly present.  Cardiovascular: Normal rate and regular rhythm.   1/6 systolic murmur.   Pulmonary/Chest: Breath sounds normal. No respiratory distress. She has no wheezes.  Abdominal: Soft. Bowel sounds are normal. There is no tenderness.  Abdominal bruit.   Musculoskeletal: She exhibits no edema or tenderness.  Lymphadenopathy:    She has no cervical adenopathy.  Skin: No rash noted. No erythema.  Psychiatric: She has a normal mood and affect. Her behavior is normal.    BP 130/64 (BP Location: Left Arm, Patient Position: Sitting, Cuff Size: Normal)   Pulse 62   Temp 98.6  F (37 C) (Oral)   Resp 14   Ht 5\' 1"  (1.549 m)   Wt 120 lb 9.6 oz (54.7 kg)   SpO2 98%   BMI 22.79 kg/m  Wt Readings from Last 3 Encounters:  01/14/17 120 lb 9.6 oz (54.7 kg)  11/19/16 119 lb 9.6 oz (54.3 kg)  08/18/16 115 lb (52.2 kg)     Lab Results  Component Value Date   WBC 5.2 08/18/2016   HGB 11.7 (L) 08/18/2016   HCT 34.9 (L) 08/18/2016   PLT 283 08/18/2016   GLUCOSE 93 12/22/2016   CHOL 261 (H) 12/11/2016   TRIG 140.0 12/11/2016   HDL 65.50 12/11/2016   LDLCALC 167 (H) 12/11/2016   ALT 12 12/11/2016   AST 21 12/11/2016   NA 134 (L) 01/02/2017   K 3.8 12/22/2016   CL 95 (L) 12/22/2016   CREATININE 1.17 12/22/2016   BUN 24 (H) 12/22/2016   CO2 29 12/22/2016   TSH 2.12 12/11/2016   INR 0.92 08/07/2016    Mm Digital Screening Bilateral  Result Date: 12/18/2016 CLINICAL DATA:  Screening. EXAM: DIGITAL SCREENING BILATERAL MAMMOGRAM WITH CAD COMPARISON:  Previous exam(s). ACR Breast Density Category a: The breast tissue is almost entirely fatty. FINDINGS: There are no findings suspicious for malignancy. Images were processed with CAD. IMPRESSION: No  mammographic evidence of malignancy. A result letter of this screening mammogram will be mailed directly to the patient. RECOMMENDATION: Screening mammogram in one year. (Code:SM-B-01Y) BI-RADS CATEGORY  1: Negative. Electronically Signed   By: Pamelia Hoit M.D.   On: 12/18/2016 18:21       Assessment & Plan:   Problem List Items Addressed This Visit    Abdominal bruit    Check aortic ultrasound      Relevant Orders   Ambulatory referral to Vascular Surgery   Anemia - Primary   Relevant Orders   CBC with Differential/Platelet   Ferritin   Diverticulosis    Bowels doing better.  Eating better.  Follow.        GERD (gastroesophageal reflux disease)    Upper symptoms controlled on omeprazole.        Relevant Medications   omeprazole (PRILOSEC) 20 MG capsule   Hypercholesterolemia    On lovastatin.  Low cholesterol diet and exercise.  Follow lipid panel and liver function tests.        Relevant Medications   triamterene-hydrochlorothiazide (MAXZIDE-25) 37.5-25 MG tablet   lovastatin (MEVACOR) 40 MG tablet   losartan (COZAAR) 100 MG tablet   diltiazem (DILACOR XR) 240 MG 24 hr capsule   Other Relevant Orders   Hepatic function panel   Lipid panel   Hypertension    Blood pressure under good control.  Continue same medication regimen.  Follow pressures.  Follow metabolic panel.        Relevant Medications   triamterene-hydrochlorothiazide (MAXZIDE-25) 37.5-25 MG tablet   lovastatin (MEVACOR) 40 MG tablet   losartan (COZAAR) 100 MG tablet   diltiazem (DILACOR XR) 240 MG 24 hr capsule   Other Relevant Orders   Basic metabolic panel   Hyponatremia    Sodium slightly decreased recently.  Last check improved.  Will follow.       Inflammatory arthritis ([place])    Followed by Dr Jefm Bryant.  Increased wrist/arm pain.  Request refill gabapentin.  Follow.        Loss of weight    Eating better.  Appetite is better.  Bowels better.  Weight improved.  Follow.  Lumbar  stenosis    S/p lumbar laminectomy.  Pain better.  Doing well.  Follow.        Stress    Increased stress as outlined.  Discussed with her today.  She feels she is handling things relatively well.  Follow.         Other Visit Diagnoses    Encounter for immunization       Relevant Medications   triamterene-hydrochlorothiazide (MAXZIDE-25) 37.5-25 MG tablet   omeprazole (PRILOSEC) 20 MG capsule   lovastatin (MEVACOR) 40 MG tablet   losartan (COZAAR) 100 MG tablet   fluticasone (FLONASE) 50 MCG/ACT nasal spray   diltiazem (DILACOR XR) 240 MG 24 hr capsule   gabapentin (NEURONTIN) 300 MG capsule   acyclovir (ZOVIRAX) 400 MG tablet   Other Relevant Orders   Flu vaccine HIGH DOSE PF (Completed)   Ambulatory referral to Vascular Surgery       Einar Pheasant, MD

## 2017-01-16 ENCOUNTER — Encounter: Payer: Self-pay | Admitting: Internal Medicine

## 2017-01-16 DIAGNOSIS — R0989 Other specified symptoms and signs involving the circulatory and respiratory systems: Secondary | ICD-10-CM | POA: Insufficient documentation

## 2017-01-16 NOTE — Assessment & Plan Note (Signed)
On lovastatin.  Low cholesterol diet and exercise.  Follow lipid panel and liver function tests.   

## 2017-01-16 NOTE — Assessment & Plan Note (Signed)
Sodium slightly decreased recently.  Last check improved.  Will follow.

## 2017-01-16 NOTE — Assessment & Plan Note (Signed)
Eating better.  Appetite is better.  Bowels better.  Weight improved.  Follow.

## 2017-01-16 NOTE — Assessment & Plan Note (Signed)
Blood pressure under good control.  Continue same medication regimen.  Follow pressures.  Follow metabolic panel.   

## 2017-01-16 NOTE — Assessment & Plan Note (Signed)
Check aortic ultrasound

## 2017-01-16 NOTE — Assessment & Plan Note (Signed)
S/p lumbar laminectomy.  Pain better.  Doing well.  Follow.

## 2017-01-16 NOTE — Assessment & Plan Note (Signed)
Bowels doing better.  Eating better.  Follow.

## 2017-01-16 NOTE — Assessment & Plan Note (Signed)
Followed by Dr Jefm Bryant.  Increased wrist/arm pain.  Request refill gabapentin.  Follow.

## 2017-01-16 NOTE — Assessment & Plan Note (Signed)
Increased stress as outlined.  Discussed with her today.  She feels she is handling things relatively well.  Follow.   

## 2017-01-16 NOTE — Assessment & Plan Note (Signed)
Upper symptoms controlled on omeprazole.  

## 2017-01-19 ENCOUNTER — Other Ambulatory Visit: Payer: Self-pay | Admitting: Internal Medicine

## 2017-01-19 DIAGNOSIS — R0989 Other specified symptoms and signs involving the circulatory and respiratory systems: Secondary | ICD-10-CM

## 2017-01-19 DIAGNOSIS — Z87898 Personal history of other specified conditions: Secondary | ICD-10-CM

## 2017-01-19 NOTE — Progress Notes (Unsigned)
Opened in error

## 2017-01-21 ENCOUNTER — Other Ambulatory Visit: Payer: Self-pay | Admitting: Internal Medicine

## 2017-01-21 DIAGNOSIS — R0989 Other specified symptoms and signs involving the circulatory and respiratory systems: Secondary | ICD-10-CM

## 2017-01-21 NOTE — Progress Notes (Signed)
Order placed for aaa duplex.

## 2017-02-03 DIAGNOSIS — M25531 Pain in right wrist: Secondary | ICD-10-CM | POA: Diagnosis not present

## 2017-02-03 DIAGNOSIS — Z79899 Other long term (current) drug therapy: Secondary | ICD-10-CM | POA: Diagnosis not present

## 2017-02-03 DIAGNOSIS — M0579 Rheumatoid arthritis with rheumatoid factor of multiple sites without organ or systems involvement: Secondary | ICD-10-CM | POA: Diagnosis not present

## 2017-02-03 DIAGNOSIS — M0609 Rheumatoid arthritis without rheumatoid factor, multiple sites: Secondary | ICD-10-CM | POA: Diagnosis not present

## 2017-03-04 ENCOUNTER — Ambulatory Visit (INDEPENDENT_AMBULATORY_CARE_PROVIDER_SITE_OTHER): Payer: Medicare Other

## 2017-03-04 DIAGNOSIS — R0989 Other specified symptoms and signs involving the circulatory and respiratory systems: Secondary | ICD-10-CM

## 2017-03-24 ENCOUNTER — Other Ambulatory Visit: Payer: Self-pay

## 2017-03-24 ENCOUNTER — Ambulatory Visit
Admission: EM | Admit: 2017-03-24 | Discharge: 2017-03-24 | Disposition: A | Discharge status: Home / Self Care | Payer: Medicare Other | Attending: Family Medicine | Admitting: Family Medicine

## 2017-03-24 DIAGNOSIS — N390 Urinary tract infection, site not specified: Secondary | ICD-10-CM | POA: Diagnosis not present

## 2017-03-24 DIAGNOSIS — Z8249 Family history of ischemic heart disease and other diseases of the circulatory system: Secondary | ICD-10-CM | POA: Diagnosis not present

## 2017-03-24 DIAGNOSIS — R319 Hematuria, unspecified: Secondary | ICD-10-CM | POA: Insufficient documentation

## 2017-03-24 DIAGNOSIS — R350 Frequency of micturition: Secondary | ICD-10-CM | POA: Diagnosis present

## 2017-03-24 DIAGNOSIS — Z833 Family history of diabetes mellitus: Secondary | ICD-10-CM | POA: Insufficient documentation

## 2017-03-24 DIAGNOSIS — Z841 Family history of disorders of kidney and ureter: Secondary | ICD-10-CM | POA: Diagnosis not present

## 2017-03-24 DIAGNOSIS — Z8041 Family history of malignant neoplasm of ovary: Secondary | ICD-10-CM | POA: Diagnosis not present

## 2017-03-24 LAB — URINALYSIS, COMPLETE (UACMP) WITH MICROSCOPIC
Bilirubin Urine: NEGATIVE
Glucose, UA: NEGATIVE mg/dL
Ketones, ur: NEGATIVE mg/dL
NITRITE: NEGATIVE
PH: 8.5 — AB (ref 5.0–8.0)
Protein, ur: NEGATIVE mg/dL
SPECIFIC GRAVITY, URINE: 1.015 (ref 1.005–1.030)
Squamous Epithelial / LPF: NONE SEEN

## 2017-03-24 MED ORDER — PHENAZOPYRIDINE HCL 100 MG PO TABS: 100.0000 mg | ORAL_TABLET | Freq: Three times a day (TID) | ORAL | 0 refills | Status: DC | PRN

## 2017-03-24 MED ORDER — CEPHALEXIN 500 MG PO CAPS: 500.0000 mg | ORAL_CAPSULE | Freq: Two times a day (BID) | ORAL | 0 refills | Status: AC

## 2017-03-24 NOTE — ED Provider Notes (Signed)
[facility] ____________________________________________  Time seen: Approximately 8:36 AM  I have reviewed the triage vital signs and the nursing notes.   HISTORY  Chief Complaint Urinary Frequency   HPI Stacey Snyder is a 83 y.o. female presented for evaluation of 3 days of urinary frequency, urinary urgency and burning with urination.  States feels like she is having to frequently go back to the bathroom and little urine is coming out.  Denies vaginal discharge or vaginal complaints.  Denies of coming back pain, abdominal pain, fevers, vomiting, diarrhea.  Denies known trigger.  States has had some nausea, but again no vomiting.  Reports continues to eat and drink well.  States has a remote history of UTIs with similar presentation.  Denies recent UTI recent antibiotic use. Denies other aggravating or alleviating factors. No otc medications taken.   Denies chest pain, shortness of breath, abdominal pain, or rash. Denies recent sickness. Denies recent antibiotic use.   Einar Pheasant, MD: PCP   Past Medical History:  Diagnosis Date  . Diverticulosis   . Fibrocystic breast disease   . GERD (gastroesophageal reflux disease)   . History of kidney stones   . Hypercholesterolemia   . Hypertension   . Hypertension   . IBS (irritable bowel syndrome)   . Inflammatory arthritis    positive anti-CCP abs, s/p prednisone, MTX, Remicade  . Nephrolithiasis   . Osteopenia    GI upset with Fosamax  . Pancreatitis 2007   s/p ERCP  . Pancreatitis   . PONV (postoperative nausea and vomiting)   . Renal insufficiency   . Spinal stenosis of lumbosacral region     Patient Active Problem List   Diagnosis Date Noted  . Abdominal bruit 01/16/2017  . Lumbar stenosis 08/18/2016  . Abdominal pain 12/03/2015  . Abnormal liver function tests 10/18/2015  . Anemia 10/18/2015  . Hyponatremia 10/07/2015  . Lip lesion 04/23/2015  . Scalp lesion 04/23/2015  . Loss of  weight 04/23/2015  . Stress 04/23/2015  . Left shoulder pain 12/20/2014  . UTI (urinary tract infection) 08/20/2014  . Health care maintenance 08/20/2014  . Skin lesion 04/17/2014  . Fatigue 04/13/2014  . Shoulder pain, right 12/17/2013  . Trigger finger 08/21/2013  . Left hip pain 04/17/2013  . GERD (gastroesophageal reflux disease) 04/03/2012  . Inflammatory arthritis ([place]) 03/30/2012  . Diverticulosis 03/30/2012  . Osteopenia 03/30/2012  . Hypertension 03/30/2012  . Hypercholesterolemia 03/30/2012    Past Surgical History:  Procedure Laterality Date  . ABDOMINAL HYSTERECTOMY  1993   ovaries not removed  . BREAST BIOPSY Left 1984   negative  . BREAST CYST EXCISION Left 2010   negative  . LITHOTRIPSY    . LUMBAR LAMINECTOMY  1983  . LUMBAR LAMINECTOMY/DECOMPRESSION MICRODISCECTOMY 1 LEVEL N/A 08/18/2016   Performed by Bayard Hugger, MD at [facility]  . TRIGGER FINGER RELEASE     right ring finger     No current facility-administered medications for this encounter.   Current Outpatient Medications:  .  acyclovir (ZOVIRAX) 400 MG tablet, As directed, Disp: 30 tablet, Rfl: 0 .  baclofen (LIORESAL) 10 MG tablet, Take 1 tablet (10 mg total) by mouth 3 (three) times daily as needed for muscle spasms., Disp: 45 each, Rfl: 0 .  carisoprodol (SOMA) 350 MG tablet, Take 1 tablet (350 mg total) by mouth 3 (three) times daily., Disp: 60 tablet, Rfl: 0 .  Cholecalciferol (VITAMIN D) 2000 UNITS tablet, Take 2,000 Units by mouth daily., Disp: ,  Rfl:  .  diltiazem (DILACOR XR) 240 MG 24 hr capsule, Take 1 capsule (240 mg total) by mouth daily., Disp: 90 capsule, Rfl: 1 .  fluticasone (FLONASE) 50 MCG/ACT nasal spray, USE TWO SPRAY(S) IN EACH NOSTRIL ONCE DAILY, Disp: 16 g, Rfl: 11 .  folic acid (FOLVITE) 1 MG tablet, Take 1 mg by mouth 2 (two) times daily. , Disp: , Rfl:  .  gabapentin (NEURONTIN) 300 MG capsule, Take 1 capsule (300 mg total) by mouth at bedtime., Disp: 30 capsule, Rfl: 1 .   HYDROcodone-acetaminophen (NORCO) 7.5-325 MG tablet, Take 1-2 tablets by mouth every 6 (six) hours as needed for moderate pain or severe pain., Disp: 50 tablet, Rfl: 0 .  InFLIXimab (REMICADE IV), Inject into the vein every 6 (six) weeks. Per Dr Jefm Bryant, Disp: , Rfl:  .  losartan (COZAAR) 100 MG tablet, Take 1 tablet (100 mg total) by mouth daily., Disp: 90 tablet, Rfl: 3 .  lovastatin (MEVACOR) 40 MG tablet, Take 1 tablet (40 mg total) by mouth daily., Disp: 90 tablet, Rfl: 1 .  methotrexate 25 MG/ML SOLN, Inject 17.5 mg into the skin once a week. Patient takes 17.5 mg (0.7 ml) on Monday or Tuesday of each week., Disp: , Rfl:  .  omeprazole (PRILOSEC) 20 MG capsule, Take 1 capsule (20 mg total) by mouth daily., Disp: 90 capsule, Rfl: 1 .  ondansetron (ZOFRAN) 4 MG tablet, Take 1 tablet (4 mg total) by mouth every 6 (six) hours as needed for nausea or vomiting., Disp: 20 tablet, Rfl: 0 .  polyethylene glycol (MIRALAX / GLYCOLAX) packet, Take 17 g by mouth daily., Disp: , Rfl:  .  psyllium (METAMUCIL) 58.6 % packet, Take 1 packet by mouth daily., Disp: , Rfl:  .  traMADol (ULTRAM) 50 MG tablet, Take 1-2 tablets by mouth every 6 hours as needed for moderate to severe pain, Disp: 20 tablet, Rfl: 0 .  triamterene-hydrochlorothiazide (MAXZIDE-25) 37.5-25 MG tablet, Take 1 tablet by mouth daily., Disp: 90 tablet, Rfl: 1 .  cephALEXin (KEFLEX) 500 MG capsule, Take 1 capsule (500 mg total) 2 (two) times daily for 7 days by mouth., Disp: 14 capsule, Rfl: 0 .  phenazopyridine (PYRIDIUM) 100 MG tablet, Take 1 tablet (100 mg total) 3 (three) times daily as needed by mouth for pain., Disp: 6 tablet, Rfl: 0  Allergies Carisoprodol; Codeine; Flagyl [metronidazole]; Nsaids; Skelaxin [metaxalone]; and Tenormin [atenolol]  Family History  Problem Relation Age of Onset  . Ovarian cancer Mother   . Renal Disease Mother   . Heart disease Mother   . Diabetes Mother   . Hypertension Mother   . Breast cancer Neg  Hx     Social History Social History   Tobacco Use  . Smoking status: Never Smoker  . Smokeless tobacco: Never Used  Substance Use Topics  . Alcohol use: No    Alcohol/week: 0.0 oz  . Drug use: No    Review of Systems Constitutional: No fever/chills Cardiovascular: Denies chest pain. Respiratory: Denies shortness of breath. Gastrointestinal: No abdominal pain.   Genitourinary: positive for dysuria. Musculoskeletal: Negative for back pain. Skin: Negative for rash.   ____________________________________________   PHYSICAL EXAM:  VITAL SIGNS: ED Triage Vitals  Enc Vitals Group     BP 03/24/17 0821 (!) 162/64     Pulse Rate 03/24/17 0821 68     Resp 03/24/17 0821 18     Temp 03/24/17 0821 98.4 F (36.9 C)     Temp Source 03/24/17 6378  Oral     SpO2 03/24/17 0821 100 %     Weight 03/24/17 0818 115 lb (52.2 kg)     Height 03/24/17 0818 5\' 1"  (1.549 m)     Head Circumference --      Peak Flow --      Pain Score 03/24/17 0818 8     Pain Loc --      Pain Edu? --      Excl. in [place]? --     Constitutional: Alert and oriented. Well appearing and in no acute distress. Cardiovascular: Normal rate, regular rhythm. Grossly normal heart sounds.  Good peripheral circulation. Respiratory: Normal respiratory effort without tachypnea nor retractions. Breath sounds are clear and equal bilaterally. No wheezes, rales, rhonchi. Gastrointestinal: Soft and nontender.No CVA tenderness. Musculoskeletal: No midline cervical, thoracic or lumbar tenderness to palpation.  Neurologic:  Normal speech and language.Speech is normal. No gait instability.  Skin:  Skin is warm, dry Psychiatric: Mood and affect are normal. Speech and behavior are normal. Patient exhibits appropriate insight and judgment   ___________________________________________   LABS (all labs ordered are listed, but only abnormal results are displayed)  Labs Reviewed  URINALYSIS, COMPLETE (UACMP) WITH MICROSCOPIC -  Abnormal; Notable for the following components:      Result Value   APPearance HAZY (*)    pH 8.5 (*)    Hgb urine dipstick SMALL (*)    Leukocytes, UA SMALL (*)    Bacteria, UA RARE (*)    All other components within normal limits  URINE CULTURE   PROCEDURES Procedures    INITIAL IMPRESSION / ASSESSMENT AND PLAN / ED COURSE  Pertinent labs & imaging results that were available during my care of the patient were reviewed by me and considered in my medical decision making (see chart for details).  Well-appearing patient.  No acute distress.  Urinalysis reviewed, suspect UTI.  Will culture urine.  Most recently was treated with Bactrim in April of this past year.  Will treat patient with oral Keflex. Patient requests Pyridium. Encourage rest, fluids, supportive care. Discussed indication, risks and benefits of medications with patient.   Discussed follow up with Primary care physician this week. Discussed follow up and return parameters including no resolution or any worsening concerns. Patient verbalized understanding and agreed to plan.   ____________________________________________   FINAL CLINICAL IMPRESSION(S) / ED DIAGNOSES  Final diagnoses:  Urinary tract infection with hematuria, site unspecified     ED Discharge Orders        Ordered    cephALEXin (KEFLEX) 500 MG capsule  2 times daily     03/24/17 0832    phenazopyridine (PYRIDIUM) 100 MG tablet  3 times daily PRN     03/24/17 3748       Note: This dictation was prepared with Dragon dictation along with smaller phrase technology. Any transcriptional errors that result from this process are unintentional.         Marylene Land, NP 03/24/17 [phone]

## 2017-03-24 NOTE — ED Triage Notes (Signed)
Patient complains of urinary urgency, frequency with little output, burning with urination x Friday at 1pm.

## 2017-03-24 NOTE — Discharge Instructions (Signed)
Take medication as prescribed. Rest. Drink plenty of fluids.  ° °Follow up with your primary care physician this week as needed. Return to Urgent care for new or worsening concerns.  ° °

## 2017-03-25 LAB — URINE CULTURE

## 2017-04-06 NOTE — Progress Notes (Signed)
Tried to reach patient by phone no answer and no voicemail could not leave message will continue to monitor.

## 2017-04-17 ENCOUNTER — Other Ambulatory Visit: Payer: Medicare Other

## 2017-04-20 DIAGNOSIS — H353131 Nonexudative age-related macular degeneration, bilateral, early dry stage: Secondary | ICD-10-CM | POA: Diagnosis not present

## 2017-04-20 DIAGNOSIS — M0579 Rheumatoid arthritis with rheumatoid factor of multiple sites without organ or systems involvement: Secondary | ICD-10-CM | POA: Diagnosis not present

## 2017-04-21 ENCOUNTER — Ambulatory Visit (INDEPENDENT_AMBULATORY_CARE_PROVIDER_SITE_OTHER): Payer: Medicare Other | Admitting: Internal Medicine

## 2017-04-21 ENCOUNTER — Encounter: Payer: Self-pay | Admitting: Internal Medicine

## 2017-04-21 ENCOUNTER — Other Ambulatory Visit: Payer: Medicare Other

## 2017-04-21 DIAGNOSIS — R0989 Other specified symptoms and signs involving the circulatory and respiratory systems: Secondary | ICD-10-CM | POA: Diagnosis not present

## 2017-04-21 DIAGNOSIS — M48061 Spinal stenosis, lumbar region without neurogenic claudication: Secondary | ICD-10-CM | POA: Diagnosis not present

## 2017-04-21 DIAGNOSIS — I10 Essential (primary) hypertension: Secondary | ICD-10-CM

## 2017-04-21 DIAGNOSIS — D649 Anemia, unspecified: Secondary | ICD-10-CM

## 2017-04-21 DIAGNOSIS — E7800 Pure hypercholesterolemia, unspecified: Secondary | ICD-10-CM

## 2017-04-21 DIAGNOSIS — M1990 Unspecified osteoarthritis, unspecified site: Secondary | ICD-10-CM

## 2017-04-21 DIAGNOSIS — F439 Reaction to severe stress, unspecified: Secondary | ICD-10-CM

## 2017-04-21 DIAGNOSIS — K5791 Diverticulosis of intestine, part unspecified, without perforation or abscess with bleeding: Secondary | ICD-10-CM

## 2017-04-21 DIAGNOSIS — K219 Gastro-esophageal reflux disease without esophagitis: Secondary | ICD-10-CM

## 2017-04-21 LAB — CBC WITH DIFFERENTIAL/PLATELET
Basophils Absolute: 0 10*3/uL (ref 0.0–0.1)
Basophils Relative: 0.4 % (ref 0.0–3.0)
EOS ABS: 0.1 10*3/uL (ref 0.0–0.7)
Eosinophils Relative: 1.1 % (ref 0.0–5.0)
HCT: 36.4 % (ref 36.0–46.0)
HEMOGLOBIN: 12 g/dL (ref 12.0–15.0)
LYMPHS PCT: 41.9 % (ref 12.0–46.0)
Lymphs Abs: 2.3 10*3/uL (ref 0.7–4.0)
MCHC: 33 g/dL (ref 30.0–36.0)
MCV: 96 fl (ref 78.0–100.0)
MONO ABS: 0.5 10*3/uL (ref 0.1–1.0)
Monocytes Relative: 8.5 % (ref 3.0–12.0)
NEUTROS PCT: 48.1 % (ref 43.0–77.0)
Neutro Abs: 2.7 10*3/uL (ref 1.4–7.7)
Platelets: 336 10*3/uL (ref 150.0–400.0)
RBC: 3.79 Mil/uL — AB (ref 3.87–5.11)
RDW: 14.3 % (ref 11.5–15.5)
WBC: 5.6 10*3/uL (ref 4.0–10.5)

## 2017-04-21 LAB — LIPID PANEL
CHOL/HDL RATIO: 2
CHOLESTEROL: 180 mg/dL (ref 0–200)
HDL: 74.8 mg/dL (ref >39.00)
LDL CALC: 86 mg/dL (ref 0–99)
NonHDL: 104.86
TRIGLYCERIDES: 96 mg/dL (ref 0.0–149.0)
VLDL: 19.2 mg/dL (ref 0.0–40.0)

## 2017-04-21 LAB — BASIC METABOLIC PANEL
BUN: 27 mg/dL — ABNORMAL HIGH (ref 6–23)
CO2: 31 mEq/L (ref 19–32)
Calcium: 9.5 mg/dL (ref 8.4–10.5)
Chloride: 97 mEq/L (ref 96–112)
Creatinine, Ser: 1.3 mg/dL — ABNORMAL HIGH (ref 0.40–1.20)
GFR: 41.68 mL/min — ABNORMAL LOW (ref >60.00)
Glucose, Bld: 95 mg/dL (ref 70–99)
POTASSIUM: 3.3 meq/L — AB (ref 3.5–5.1)
Sodium: 137 mEq/L (ref 135–145)

## 2017-04-21 LAB — HEPATIC FUNCTION PANEL
ALBUMIN: 4.4 g/dL (ref 3.5–5.2)
ALT: 10 U/L (ref 0–35)
AST: 21 U/L (ref 0–37)
Alkaline Phosphatase: 67 U/L (ref 39–117)
Bilirubin, Direct: 0.1 mg/dL (ref 0.0–0.3)
TOTAL PROTEIN: 7 g/dL (ref 6.0–8.3)
Total Bilirubin: 0.6 mg/dL (ref 0.2–1.2)

## 2017-04-21 LAB — FERRITIN: Ferritin: 189.9 ng/mL (ref 10.0–291.0)

## 2017-04-21 NOTE — Assessment & Plan Note (Signed)
S/p lumbar laminectomy.  Pain better.  Follow.

## 2017-04-21 NOTE — Assessment & Plan Note (Signed)
Increased stress as outlined.  Discussed with her today.  Does not feel needs any further intervention.  Follow.   

## 2017-04-21 NOTE — Assessment & Plan Note (Signed)
Controlled on omeprazole.   

## 2017-04-21 NOTE — Assessment & Plan Note (Signed)
Blood pressure as outlined.  Same medication regimen.  Follow pressures.  Follow metabolic panel.  

## 2017-04-21 NOTE — Assessment & Plan Note (Signed)
Recheck cbc and ferritin.   

## 2017-04-21 NOTE — Assessment & Plan Note (Signed)
Aortic ultrasound reviewed.  No aneurysm.  Iliac disease as outlined.  Wanted to refer to vascular surgery - to follow.  She declines.  Will notify me if changes her mind.  Continue lovastatin.

## 2017-04-21 NOTE — Assessment & Plan Note (Signed)
Bowels doing better.  Weight is up.  No pain.  Follow.

## 2017-04-21 NOTE — Assessment & Plan Note (Signed)
On lovastatin.  Follow lipid panel and liver function tests.   

## 2017-04-21 NOTE — Progress Notes (Signed)
Pre visit review using our clinic review tool, if applicable. No additional management support is needed unless otherwise documented below in the visit note. 

## 2017-04-21 NOTE — Progress Notes (Signed)
Patient ID: Stacey Snyder, female   DOB: 08/11/1932, 83 y.o.   MRN: [id number]   Subjective:    Patient ID: Stacey Snyder, female    DOB: [birth year], 83 y.o.   MRN: [id number]  HPI  Patient here for a scheduled follow up.  S/p lumbar laminectomy 08/18/16.  Followed by Dr Aris Lot.  Pain is better.  Doing well.  She stays active.  No chest pain.  No sob.  No acid reflux.  States she is eating better.  Weight is up.  No vomiting.  No abdominal pain.  Takes miralax prn.  Bowels moving. Weight improved.   Increased stress with her husband's medical issues.  She is primary caretaker.  Overall she feels she is handling things relatively well.  Recent UTI.  Symptoms resolved.  Some persistent right wrist pain.  Sees Dr Jefm Bryant.  Had remicade infusion yesterday.  He referred her to therapy for her wrist.  Not able to go.  Wants to try an ace bandage for support.  Discussed recent ultrasound.  No aneurysm.  Ultrasound reviewed.  Some stenosis in iliac.  Discussed referral to vascular surgery.  She declines.     Past Medical History:  Diagnosis Date  . Diverticulosis   . Fibrocystic breast disease   . GERD (gastroesophageal reflux disease)   . History of kidney stones   . Hypercholesterolemia   . Hypertension   . Hypertension   . IBS (irritable bowel syndrome)   . Inflammatory arthritis    positive anti-CCP abs, s/p prednisone, MTX, Remicade  . Nephrolithiasis   . Osteopenia    GI upset with Fosamax  . Pancreatitis 2007   s/p ERCP  . Pancreatitis   . PONV (postoperative nausea and vomiting)   . Renal insufficiency   . Spinal stenosis of lumbosacral region    Past Surgical History:  Procedure Laterality Date  . ABDOMINAL HYSTERECTOMY  1993   ovaries not removed  . BREAST BIOPSY Left 1984   negative  . BREAST CYST EXCISION Left 2010   negative  . LITHOTRIPSY    . LUMBAR LAMINECTOMY  1983  . LUMBAR LAMINECTOMY/DECOMPRESSION MICRODISCECTOMY N/A 08/18/2016   Procedure: LUMBAR  LAMINECTOMY/DECOMPRESSION MICRODISCECTOMY 1 LEVEL;  Surgeon: Bayard Hugger, MD;  Location: [facility] ORS;  Service: Neurosurgery;  Laterality: N/A;  L4-5 Laminectomy, MIS, L5 foraminotomy  . TRIGGER FINGER RELEASE     right ring finger   Family History  Problem Relation Age of Onset  . Ovarian cancer Mother   . Renal Disease Mother   . Heart disease Mother   . Diabetes Mother   . Hypertension Mother   . Breast cancer Neg Hx    Social History   Socioeconomic History  . Marital status: Married    Spouse name: None  . Number of children: 4  . Years of education: None  . Highest education level: None  Social Needs  . Financial resource strain: None  . Food insecurity - worry: None  . Food insecurity - inability: None  . Transportation needs - medical: None  . Transportation needs - non-medical: None  Occupational History  . None  Tobacco Use  . Smoking status: Never Smoker  . Smokeless tobacco: Never Used  Substance and Sexual Activity  . Alcohol use: No    Alcohol/week: 0.0 oz  . Drug use: No  . Sexual activity: None  Other Topics Concern  . None  Social History Narrative  . None    Outpatient Encounter Medications as  of 04/21/2017  Medication Sig  . acyclovir (ZOVIRAX) 400 MG tablet As directed  . baclofen (LIORESAL) 10 MG tablet Take 1 tablet (10 mg total) by mouth 3 (three) times daily as needed for muscle spasms.  . carisoprodol (SOMA) 350 MG tablet Take 1 tablet (350 mg total) by mouth 3 (three) times daily.  . Cholecalciferol (VITAMIN D) 2000 UNITS tablet Take 2,000 Units by mouth daily.  Marland Kitchen diltiazem (DILACOR XR) 240 MG 24 hr capsule Take 1 capsule (240 mg total) by mouth daily.  . fluticasone (FLONASE) 50 MCG/ACT nasal spray USE TWO SPRAY(S) IN EACH NOSTRIL ONCE DAILY  . folic acid (FOLVITE) 1 MG tablet Take 1 mg by mouth 2 (two) times daily.   Marland Kitchen gabapentin (NEURONTIN) 300 MG capsule Take 1 capsule (300 mg total) by mouth at bedtime.  Marland Kitchen HYDROcodone-acetaminophen  (NORCO) 7.5-325 MG tablet Take 1-2 tablets by mouth every 6 (six) hours as needed for moderate pain or severe pain.  . InFLIXimab (REMICADE IV) Inject into the vein every 6 (six) weeks. Per Dr Jefm Bryant  . losartan (COZAAR) 100 MG tablet Take 1 tablet (100 mg total) by mouth daily.  Marland Kitchen lovastatin (MEVACOR) 40 MG tablet Take 1 tablet (40 mg total) by mouth daily.  . methotrexate 25 MG/ML SOLN Inject 17.5 mg into the skin once a week. Patient takes 17.5 mg (0.7 ml) on Monday or Tuesday of each week.  Marland Kitchen omeprazole (PRILOSEC) 20 MG capsule Take 1 capsule (20 mg total) by mouth daily.  . ondansetron (ZOFRAN) 4 MG tablet Take 1 tablet (4 mg total) by mouth every 6 (six) hours as needed for nausea or vomiting.  . phenazopyridine (PYRIDIUM) 100 MG tablet Take 1 tablet (100 mg total) 3 (three) times daily as needed by mouth for pain.  . polyethylene glycol (MIRALAX / GLYCOLAX) packet Take 17 g by mouth daily.  . psyllium (METAMUCIL) 58.6 % packet Take 1 packet by mouth daily.  . traMADol (ULTRAM) 50 MG tablet Take 1-2 tablets by mouth every 6 hours as needed for moderate to severe pain  . triamterene-hydrochlorothiazide (MAXZIDE-25) 37.5-25 MG tablet Take 1 tablet by mouth daily.   No facility-administered encounter medications on file as of 04/21/2017.     Review of Systems  Constitutional: Negative for appetite change and unexpected weight change.  HENT: Negative for congestion and sinus pressure.   Respiratory: Negative for cough, chest tightness and shortness of breath.   Cardiovascular: Negative for chest pain, palpitations and leg swelling.  Gastrointestinal: Negative for abdominal pain, diarrhea, nausea and vomiting.  Genitourinary: Negative for difficulty urinating and dysuria.  Musculoskeletal: Negative for myalgias.       Back is better.   Right wrist pain as outlined.    Skin: Negative for color change and rash.  Neurological: Negative for dizziness, light-headedness and headaches.    Psychiatric/Behavioral: Negative for agitation and dysphoric mood.       Objective:     Blood pressure rechecked by me:  146/68  Physical Exam  Constitutional: She appears well-developed and well-nourished. No distress.  HENT:  Nose: Nose normal.  Mouth/Throat: Oropharynx is clear and moist.  Neck: Neck supple. No thyromegaly present.  Cardiovascular: Normal rate and regular rhythm.  Pulmonary/Chest: Breath sounds normal. No respiratory distress. She has no wheezes.  Abdominal: Soft. Bowel sounds are normal. There is no tenderness.  Musculoskeletal: She exhibits no edema or tenderness.  Lymphadenopathy:    She has no cervical adenopathy.  Skin: No rash noted. No erythema.  Psychiatric: She has a normal mood and affect. Her behavior is normal.    BP (!) 146/60   Pulse 65   Temp 98.1 F (36.7 C) (Oral)   Ht 5\' 1"  (1.549 m)   Wt 123 lb 3.2 oz (55.9 kg)   SpO2 95%   BMI 23.28 kg/m  Wt Readings from Last 3 Encounters:  04/21/17 123 lb 3.2 oz (55.9 kg)  03/24/17 115 lb (52.2 kg)  01/14/17 120 lb 9.6 oz (54.7 kg)     Lab Results  Component Value Date   WBC 5.2 08/18/2016   HGB 11.7 (L) 08/18/2016   HCT 34.9 (L) 08/18/2016   PLT 283 08/18/2016   GLUCOSE 93 12/22/2016   CHOL 261 (H) 12/11/2016   TRIG 140.0 12/11/2016   HDL 65.50 12/11/2016   LDLCALC 167 (H) 12/11/2016   ALT 12 12/11/2016   AST 21 12/11/2016   NA 134 (L) 01/02/2017   K 3.8 12/22/2016   CL 95 (L) 12/22/2016   CREATININE 1.17 12/22/2016   BUN 24 (H) 12/22/2016   CO2 29 12/22/2016   TSH 2.12 12/11/2016   INR 0.92 08/07/2016       Assessment & Plan:   Problem List Items Addressed This Visit    Abdominal bruit    Aortic ultrasound reviewed.  No aneurysm.  Iliac disease as outlined.  Wanted to refer to vascular surgery - to follow.  She declines.  Will notify me if changes her mind.  Continue lovastatin.        Anemia    Recheck cbc and ferritin.        Diverticulosis    Bowels doing  better.  Weight is up.  No pain.  Follow.       GERD (gastroesophageal reflux disease)    Controlled on omeprazole.        Hypercholesterolemia    On lovastatin.  Follow lipid panel and liver function tests.        Hypertension    Blood pressure as outlined.  Same medication regimen.  Follow pressures.  Follow metabolic panel.        Inflammatory arthritis ([place])    Followed by Dr Jefm Bryant.  S/p remicade infustion yesterday.  Stable.        Lumbar stenosis    S/p lumbar laminectomy.  Pain better.  Follow.        Stress    Increased stress as outlined.  Discussed with her today.  Does not feel needs any further intervention.  Follow.            Einar Pheasant, MD

## 2017-04-21 NOTE — Assessment & Plan Note (Signed)
Followed by Dr Jefm Bryant.  S/p remicade infustion yesterday.  Stable.

## 2017-04-22 ENCOUNTER — Other Ambulatory Visit: Payer: Self-pay | Admitting: Internal Medicine

## 2017-04-22 DIAGNOSIS — R7989 Other specified abnormal findings of blood chemistry: Secondary | ICD-10-CM

## 2017-04-22 NOTE — Progress Notes (Signed)
Order placed for f/u lab.   

## 2017-05-15 ENCOUNTER — Other Ambulatory Visit (INDEPENDENT_AMBULATORY_CARE_PROVIDER_SITE_OTHER): Payer: Medicare Other

## 2017-05-15 DIAGNOSIS — R7989 Other specified abnormal findings of blood chemistry: Secondary | ICD-10-CM | POA: Diagnosis not present

## 2017-05-15 LAB — BASIC METABOLIC PANEL
BUN: 27 mg/dL — AB (ref 6–23)
CALCIUM: 9.5 mg/dL (ref 8.4–10.5)
CO2: 30 mEq/L (ref 19–32)
CREATININE: 1.12 mg/dL (ref 0.40–1.20)
Chloride: 95 mEq/L — ABNORMAL LOW (ref 96–112)
GFR: 49.49 mL/min — AB (ref >60.00)
Glucose, Bld: 101 mg/dL — ABNORMAL HIGH (ref 70–99)
POTASSIUM: 3.5 meq/L (ref 3.5–5.1)
Sodium: 132 mEq/L — ABNORMAL LOW (ref 135–145)

## 2017-05-17 ENCOUNTER — Other Ambulatory Visit: Payer: Self-pay | Admitting: Internal Medicine

## 2017-05-17 DIAGNOSIS — E871 Hypo-osmolality and hyponatremia: Secondary | ICD-10-CM

## 2017-05-17 NOTE — Progress Notes (Signed)
Order placed for f/u met b.  

## 2017-06-02 ENCOUNTER — Telehealth: Payer: Self-pay | Admitting: Internal Medicine

## 2017-06-02 ENCOUNTER — Other Ambulatory Visit (INDEPENDENT_AMBULATORY_CARE_PROVIDER_SITE_OTHER): Payer: Medicare Other

## 2017-06-02 DIAGNOSIS — E871 Hypo-osmolality and hyponatremia: Secondary | ICD-10-CM | POA: Diagnosis not present

## 2017-06-02 LAB — BASIC METABOLIC PANEL
BUN: 32 mg/dL — AB (ref 6–23)
CHLORIDE: 99 meq/L (ref 96–112)
CO2: 30 mEq/L (ref 19–32)
CREATININE: 1.35 mg/dL — AB (ref 0.40–1.20)
Calcium: 9.9 mg/dL (ref 8.4–10.5)
GFR: 39.89 mL/min — ABNORMAL LOW (ref >60.00)
Glucose, Bld: 92 mg/dL (ref 70–99)
POTASSIUM: 3.4 meq/L — AB (ref 3.5–5.1)
Sodium: 138 mEq/L (ref 135–145)

## 2017-06-02 MED ORDER — GABAPENTIN 300 MG PO CAPS: 300.0000 mg | ORAL_CAPSULE | Freq: Every evening | ORAL | 1 refills | Status: DC | PRN

## 2017-06-02 NOTE — Telephone Encounter (Signed)
rx sent in for gabapentin #30 with 1 refill.

## 2017-06-02 NOTE — Telephone Encounter (Signed)
Last refill was 01/14/2017 # 30 with 1 refill. Ok to refill?

## 2017-06-02 NOTE — Telephone Encounter (Signed)
Patient aware.

## 2017-06-02 NOTE — Telephone Encounter (Signed)
PT is requesting to have her gabapentin (NEURONTIN) 300 MG capsule refilled.

## 2017-06-10 ENCOUNTER — Other Ambulatory Visit: Payer: Self-pay | Admitting: Internal Medicine

## 2017-06-10 DIAGNOSIS — R7989 Other specified abnormal findings of blood chemistry: Secondary | ICD-10-CM

## 2017-06-10 NOTE — Progress Notes (Signed)
Order placed for f/u met b.  

## 2017-06-15 ENCOUNTER — Other Ambulatory Visit (INDEPENDENT_AMBULATORY_CARE_PROVIDER_SITE_OTHER): Payer: Medicare Other

## 2017-06-15 DIAGNOSIS — R7989 Other specified abnormal findings of blood chemistry: Secondary | ICD-10-CM

## 2017-06-15 DIAGNOSIS — M0609 Rheumatoid arthritis without rheumatoid factor, multiple sites: Secondary | ICD-10-CM | POA: Diagnosis not present

## 2017-06-15 DIAGNOSIS — M0579 Rheumatoid arthritis with rheumatoid factor of multiple sites without organ or systems involvement: Secondary | ICD-10-CM | POA: Diagnosis not present

## 2017-06-15 DIAGNOSIS — Z79899 Other long term (current) drug therapy: Secondary | ICD-10-CM | POA: Diagnosis not present

## 2017-06-15 LAB — BASIC METABOLIC PANEL
BUN: 33 mg/dL — ABNORMAL HIGH (ref 6–23)
CO2: 27 meq/L (ref 19–32)
Calcium: 9.5 mg/dL (ref 8.4–10.5)
Chloride: 96 mEq/L (ref 96–112)
Creatinine, Ser: 1.14 mg/dL (ref 0.40–1.20)
GFR: 48.48 mL/min — ABNORMAL LOW (ref >60.00)
GLUCOSE: 93 mg/dL (ref 70–99)
POTASSIUM: 3.9 meq/L (ref 3.5–5.1)
SODIUM: 134 meq/L — AB (ref 135–145)

## 2017-07-07 DIAGNOSIS — R300 Dysuria: Secondary | ICD-10-CM | POA: Diagnosis not present

## 2017-07-07 DIAGNOSIS — N390 Urinary tract infection, site not specified: Secondary | ICD-10-CM | POA: Diagnosis not present

## 2017-07-23 ENCOUNTER — Ambulatory Visit (INDEPENDENT_AMBULATORY_CARE_PROVIDER_SITE_OTHER): Payer: Medicare Other | Admitting: Internal Medicine

## 2017-07-23 ENCOUNTER — Encounter: Payer: Self-pay | Admitting: Internal Medicine

## 2017-07-23 VITALS — BP 130/68 | HR 59 | Temp 97.8°F | Resp 18 | Ht 61.0 in | Wt 124.6 lb

## 2017-07-23 DIAGNOSIS — D649 Anemia, unspecified: Secondary | ICD-10-CM | POA: Diagnosis not present

## 2017-07-23 DIAGNOSIS — E871 Hypo-osmolality and hyponatremia: Secondary | ICD-10-CM

## 2017-07-23 DIAGNOSIS — E7800 Pure hypercholesterolemia, unspecified: Secondary | ICD-10-CM | POA: Diagnosis not present

## 2017-07-23 DIAGNOSIS — I10 Essential (primary) hypertension: Secondary | ICD-10-CM

## 2017-07-23 DIAGNOSIS — F439 Reaction to severe stress, unspecified: Secondary | ICD-10-CM | POA: Diagnosis not present

## 2017-07-23 DIAGNOSIS — R945 Abnormal results of liver function studies: Secondary | ICD-10-CM | POA: Diagnosis not present

## 2017-07-23 DIAGNOSIS — R7989 Other specified abnormal findings of blood chemistry: Secondary | ICD-10-CM

## 2017-07-23 DIAGNOSIS — M1990 Unspecified osteoarthritis, unspecified site: Secondary | ICD-10-CM | POA: Diagnosis not present

## 2017-07-23 DIAGNOSIS — K5791 Diverticulosis of intestine, part unspecified, without perforation or abscess with bleeding: Secondary | ICD-10-CM | POA: Diagnosis not present

## 2017-07-23 DIAGNOSIS — K219 Gastro-esophageal reflux disease without esophagitis: Secondary | ICD-10-CM

## 2017-07-23 LAB — BASIC METABOLIC PANEL
BUN: 33 mg/dL — ABNORMAL HIGH (ref 6–23)
CHLORIDE: 97 meq/L (ref 96–112)
CO2: 23 meq/L (ref 19–32)
Calcium: 10.2 mg/dL (ref 8.4–10.5)
Creatinine, Ser: 1.26 mg/dL — ABNORMAL HIGH (ref 0.40–1.20)
GFR: 43.18 mL/min — ABNORMAL LOW (ref >60.00)
GLUCOSE: 104 mg/dL — AB (ref 70–99)
POTASSIUM: 4 meq/L (ref 3.5–5.1)
SODIUM: 132 meq/L — AB (ref 135–145)

## 2017-07-23 MED ORDER — GABAPENTIN 300 MG PO CAPS: 300.0000 mg | ORAL_CAPSULE | Freq: Every day | ORAL | 1 refills | Status: DC

## 2017-07-23 NOTE — Progress Notes (Signed)
Patient ID: Stacey Snyder, female   DOB: Aug 23, 1932, 84 y.o.   MRN: [id number]   Subjective:    Patient ID: Stacey Snyder, female    DOB: 04-02-1933, 84 y.o.   MRN: [id number]  HPI  Patient here for a scheduled follow up.  She reports she is doing well.  Eating.  Weight is up some.  No abdominal pain.  Bowels moving.  No bowel flares currently.  Recently treated for uti. Off abx.  Due to get remicade first week of April.  No chest pain.  No sob.  No acid reflux.  No abdominal pain.  Bowels moving.  Recently decreased her triam/hctz to 1/2 tablet - secondary to increased creatinine.  She was off for a brief period noticed some ankle swelling.  Described as worse when day progressed.  We discussed leg elevation, etc.  She has been back on her full dose triam/hctz now for a couple of weeks.  Taking gabapentin prn for her hand/wrist.  Helps.  Tolerates.  Helps her sleep.  Does not feel sleepy or hung over the next morning.     Past Medical History:  Diagnosis Date  . Diverticulosis   . Fibrocystic breast disease   . GERD (gastroesophageal reflux disease)   . History of kidney stones   . Hypercholesterolemia   . Hypertension   . Hypertension   . IBS (irritable bowel syndrome)   . Inflammatory arthritis    positive anti-CCP abs, s/p prednisone, MTX, Remicade  . Nephrolithiasis   . Osteopenia    GI upset with Fosamax  . Pancreatitis 2007   s/p ERCP  . Pancreatitis   . PONV (postoperative nausea and vomiting)   . Renal insufficiency   . Spinal stenosis of lumbosacral region    Past Surgical History:  Procedure Laterality Date  . ABDOMINAL HYSTERECTOMY  1993   ovaries not removed  . BREAST BIOPSY Left 1984   negative  . BREAST CYST EXCISION Left 2010   negative  . LITHOTRIPSY    . LUMBAR LAMINECTOMY  1983  . LUMBAR LAMINECTOMY/DECOMPRESSION MICRODISCECTOMY N/A 08/18/2016   Procedure: LUMBAR LAMINECTOMY/DECOMPRESSION MICRODISCECTOMY 1 LEVEL;  Surgeon: Bayard Hugger,  MD;  Location: [facility] ORS;  Service: Neurosurgery;  Laterality: N/A;  L4-5 Laminectomy, MIS, L5 foraminotomy  . TRIGGER FINGER RELEASE     right ring finger   Family History  Problem Relation Age of Onset  . Ovarian cancer Mother   . Renal Disease Mother   . Heart disease Mother   . Diabetes Mother   . Hypertension Mother   . Breast cancer Neg Hx    Social History   Socioeconomic History  . Marital status: Married    Spouse name: Not on file  . Number of children: 4  . Years of education: Not on file  . Highest education level: Not on file  Occupational History  . Not on file  Social Needs  . Financial resource strain: Not on file  . Food insecurity:    Worry: Not on file    Inability: Not on file  . Transportation needs:    Medical: Not on file    Non-medical: Not on file  Tobacco Use  . Smoking status: Never Smoker  . Smokeless tobacco: Never Used  Substance and Sexual Activity  . Alcohol use: No    Alcohol/week: 0.0 oz  . Drug use: No  . Sexual activity: Not on file  Lifestyle  . Physical activity:    Days per  week: Not on file    Minutes per session: Not on file  . Stress: Not on file  Relationships  . Social connections:    Talks on phone: Not on file    Gets together: Not on file    Attends religious service: Not on file    Active member of club or organization: Not on file    Attends meetings of clubs or organizations: Not on file    Relationship status: Not on file  Other Topics Concern  . Not on file  Social History Narrative  . Not on file    Outpatient Encounter Medications as of 07/23/2017  Medication Sig  . acyclovir (ZOVIRAX) 400 MG tablet As directed  . Cholecalciferol (VITAMIN D) 2000 UNITS tablet Take 2,000 Units by mouth daily.  Marland Kitchen diltiazem (DILACOR XR) 240 MG 24 hr capsule Take 1 capsule (240 mg total) by mouth daily.  . fluticasone (FLONASE) 50 MCG/ACT nasal spray USE TWO SPRAY(S) IN EACH NOSTRIL ONCE DAILY  . gabapentin (NEURONTIN) 300  MG capsule Take 1 capsule (300 mg total) by mouth at bedtime.  . InFLIXimab (REMICADE IV) Inject into the vein every 6 (six) weeks. Per Dr Jefm Bryant  . losartan (COZAAR) 100 MG tablet Take 1 tablet (100 mg total) by mouth daily.  Marland Kitchen lovastatin (MEVACOR) 40 MG tablet Take 1 tablet (40 mg total) by mouth daily.  . methotrexate 25 MG/ML SOLN Inject 17.5 mg into the skin once a week. Patient takes 17.5 mg (0.7 ml) on Monday or Tuesday of each week.  Marland Kitchen omeprazole (PRILOSEC) 20 MG capsule Take 1 capsule (20 mg total) by mouth daily.  . ondansetron (ZOFRAN) 4 MG tablet Take 1 tablet (4 mg total) by mouth every 6 (six) hours as needed for nausea or vomiting.  . polyethylene glycol (MIRALAX / GLYCOLAX) packet Take 17 g by mouth daily.  . psyllium (METAMUCIL) 58.6 % packet Take 1 packet by mouth daily.  Marland Kitchen triamterene-hydrochlorothiazide (MAXZIDE-25) 37.5-25 MG tablet Take 1 tablet by mouth daily.  . [DISCONTINUED] gabapentin (NEURONTIN) 300 MG capsule Take 1 capsule (300 mg total) by mouth at bedtime as needed.  . [DISCONTINUED] baclofen (LIORESAL) 10 MG tablet Take 1 tablet (10 mg total) by mouth 3 (three) times daily as needed for muscle spasms.  . [DISCONTINUED] carisoprodol (SOMA) 350 MG tablet Take 1 tablet (350 mg total) by mouth 3 (three) times daily.  . [DISCONTINUED] folic acid (FOLVITE) 1 MG tablet Take 1 mg by mouth 2 (two) times daily.   . [DISCONTINUED] HYDROcodone-acetaminophen (NORCO) 7.5-325 MG tablet Take 1-2 tablets by mouth every 6 (six) hours as needed for moderate pain or severe pain.  . [DISCONTINUED] phenazopyridine (PYRIDIUM) 100 MG tablet Take 1 tablet (100 mg total) 3 (three) times daily as needed by mouth for pain.  . [DISCONTINUED] traMADol (ULTRAM) 50 MG tablet Take 1-2 tablets by mouth every 6 hours as needed for moderate to severe pain   No facility-administered encounter medications on file as of 07/23/2017.     Review of Systems  Constitutional: Negative for appetite change  and unexpected weight change.  HENT: Negative for congestion and sinus pressure.   Respiratory: Negative for cough, chest tightness and shortness of breath.   Cardiovascular: Negative for chest pain, palpitations and leg swelling.  Gastrointestinal: Negative for abdominal pain, diarrhea, nausea and vomiting.  Genitourinary: Negative for difficulty urinating and dysuria.  Musculoskeletal: Negative for myalgias.       Hand and wrist pain as outlined.  Skin: Negative for color change and rash.  Neurological: Negative for dizziness, light-headedness and headaches.  Psychiatric/Behavioral: Negative for agitation and dysphoric mood.       Increased stress with her husband's health issues.         Objective:    Physical Exam  Constitutional: She appears well-developed and well-nourished. No distress.  HENT:  Nose: Nose normal.  Mouth/Throat: Oropharynx is clear and moist.  Neck: Neck supple. No thyromegaly present.  Cardiovascular: Normal rate and regular rhythm.  Pulmonary/Chest: Breath sounds normal. No respiratory distress. She has no wheezes.  Abdominal: Soft. Bowel sounds are normal. There is no tenderness.  Musculoskeletal: She exhibits no edema or tenderness.  Lymphadenopathy:    She has no cervical adenopathy.  Skin: No rash noted. No erythema.  Psychiatric: She has a normal mood and affect. Her behavior is normal.    BP 130/68 (BP Location: Left Arm, Patient Position: Sitting, Cuff Size: Normal)   Pulse (!) 59   Temp 97.8 F (36.6 C) (Oral)   Resp 18   Ht '5\' 1"'$  (1.549 m)   Wt 124 lb 9.6 oz (56.5 kg)   SpO2 96%   BMI 23.54 kg/m  Wt Readings from Last 3 Encounters:  07/23/17 124 lb 9.6 oz (56.5 kg)  04/21/17 123 lb 3.2 oz (55.9 kg)  03/24/17 115 lb (52.2 kg)     Lab Results  Component Value Date   WBC 5.6 04/21/2017   HGB 12.0 04/21/2017   HCT 36.4 04/21/2017   PLT 336.0 04/21/2017   GLUCOSE 104 (H) 07/23/2017   CHOL 180 04/21/2017   TRIG 96.0 04/21/2017    HDL 74.80 04/21/2017   LDLCALC 86 04/21/2017   ALT 10 04/21/2017   AST 21 04/21/2017   NA 132 (L) 07/23/2017   K 4.0 07/23/2017   CL 97 07/23/2017   CREATININE 1.26 (H) 07/23/2017   BUN 33 (H) 07/23/2017   CO2 23 07/23/2017   TSH 2.12 12/11/2016   INR 0.92 08/07/2016       Assessment & Plan:   Problem List Items Addressed This Visit    Abnormal liver function tests    Follow liver panel.       Anemia    Follow cbc.       Diverticulosis    Bowels doing better.  Maintaining her weight.  Follow.  No abdominal pain.        GERD (gastroesophageal reflux disease)    Controlled on omeprazole.        Hypercholesterolemia    On lovastatin.  Low cholesterol diet and exercise.  Follow lipid panel and liver function tests.        Hypertension    Blood pressure under good control.  Continue same medication regimen.  Follow pressures.  Follow metabolic panel.        Hyponatremia - Primary    Had decreased her triam/hctz.  Back on one whole tablet now.  Recheck met b.        Relevant Orders   Basic metabolic panel (Completed)   Inflammatory arthritis ([place])    Followed by Dr Jefm Bryant.  Due remicade 08/2017.  Follow.  Gabapentin helping her hand and wrist.  Will start taking nightly.  She is tolerating with no adverse effects.        Stress    Increased stress as outlined.  Overall doing better.  She feels she is coping relatively well.  Follow.  Einar Pheasant, MD

## 2017-07-24 ENCOUNTER — Other Ambulatory Visit: Payer: Self-pay | Admitting: Internal Medicine

## 2017-07-24 DIAGNOSIS — R7989 Other specified abnormal findings of blood chemistry: Secondary | ICD-10-CM

## 2017-07-24 NOTE — Progress Notes (Signed)
Order placed for f/u met b. 

## 2017-07-26 ENCOUNTER — Encounter: Payer: Self-pay | Admitting: Internal Medicine

## 2017-07-26 NOTE — Assessment & Plan Note (Signed)
Follow cbc.  

## 2017-07-26 NOTE — Assessment & Plan Note (Signed)
Had decreased her triam/hctz.  Back on one whole tablet now.  Recheck met b.   

## 2017-07-26 NOTE — Assessment & Plan Note (Signed)
Increased stress as outlined.  Overall doing better.  She feels she is coping relatively well.  Follow.

## 2017-07-26 NOTE — Assessment & Plan Note (Signed)
Follow liver panel.  

## 2017-07-26 NOTE — Assessment & Plan Note (Signed)
On lovastatin.  Low cholesterol diet and exercise.  Follow lipid panel and liver function tests.   

## 2017-07-26 NOTE — Assessment & Plan Note (Signed)
Followed by Dr Jefm Bryant.  Due remicade 08/2017.  Follow.  Gabapentin helping her hand and wrist.  Will start taking nightly.  She is tolerating with no adverse effects.

## 2017-07-26 NOTE — Assessment & Plan Note (Signed)
Controlled on omeprazole.   

## 2017-07-26 NOTE — Assessment & Plan Note (Signed)
Blood pressure under good control.  Continue same medication regimen.  Follow pressures.  Follow metabolic panel.   

## 2017-07-26 NOTE — Assessment & Plan Note (Signed)
Bowels doing better.  Maintaining her weight.  Follow.  No abdominal pain.

## 2017-08-10 DIAGNOSIS — M0579 Rheumatoid arthritis with rheumatoid factor of multiple sites without organ or systems involvement: Secondary | ICD-10-CM | POA: Diagnosis not present

## 2017-08-10 DIAGNOSIS — M0609 Rheumatoid arthritis without rheumatoid factor, multiple sites: Secondary | ICD-10-CM | POA: Diagnosis not present

## 2017-08-10 DIAGNOSIS — Z79899 Other long term (current) drug therapy: Secondary | ICD-10-CM | POA: Diagnosis not present

## 2017-08-11 ENCOUNTER — Other Ambulatory Visit (INDEPENDENT_AMBULATORY_CARE_PROVIDER_SITE_OTHER): Payer: Medicare Other

## 2017-08-11 DIAGNOSIS — R7989 Other specified abnormal findings of blood chemistry: Secondary | ICD-10-CM

## 2017-08-11 LAB — BASIC METABOLIC PANEL
BUN: 21 mg/dL (ref 6–23)
CHLORIDE: 100 meq/L (ref 96–112)
CO2: 28 mEq/L (ref 19–32)
CREATININE: 1.13 mg/dL (ref 0.40–1.20)
Calcium: 9.3 mg/dL (ref 8.4–10.5)
GFR: 48.96 mL/min — AB (ref >60.00)
Glucose, Bld: 92 mg/dL (ref 70–99)
Potassium: 3.9 mEq/L (ref 3.5–5.1)
Sodium: 133 mEq/L — ABNORMAL LOW (ref 135–145)

## 2017-08-12 ENCOUNTER — Other Ambulatory Visit: Payer: Self-pay

## 2017-08-12 DIAGNOSIS — E785 Hyperlipidemia, unspecified: Secondary | ICD-10-CM

## 2017-08-12 MED ORDER — LOVASTATIN 40 MG PO TABS
40.0000 mg | ORAL_TABLET | Freq: Every day | ORAL | 1 refills | Status: DC
Start: 2017-08-12 — End: 2018-03-05

## 2017-09-14 ENCOUNTER — Telehealth: Payer: Self-pay | Admitting: Internal Medicine

## 2017-09-14 ENCOUNTER — Other Ambulatory Visit: Payer: Self-pay

## 2017-09-14 DIAGNOSIS — E1159 Type 2 diabetes mellitus with other circulatory complications: Secondary | ICD-10-CM

## 2017-09-14 DIAGNOSIS — K219 Gastro-esophageal reflux disease without esophagitis: Secondary | ICD-10-CM

## 2017-09-14 DIAGNOSIS — I10 Essential (primary) hypertension: Principal | ICD-10-CM

## 2017-09-14 MED ORDER — OMEPRAZOLE 20 MG PO CPDR: 20.0000 mg | DELAYED_RELEASE_CAPSULE | Freq: Every day | ORAL | 1 refills | Status: DC

## 2017-09-14 MED ORDER — DILTIAZEM HCL ER 240 MG PO CP24: 240.0000 mg | ORAL_CAPSULE | Freq: Every day | ORAL | 1 refills | Status: DC

## 2017-09-14 NOTE — Telephone Encounter (Signed)
Pt requesting refill of Omeprazole and Diltiazem. Pt states she only has four left and it will take a week for medication to arrive by mail order service. Last refill on both medications was on 01/14/17 #90 with 1 refill. NT unable to order medications due to message stating"No active replacements associated with this record" and the prescription is  also asking for a diagnosis.   LOV: 07/23/17 Dr. Nicki Reaper  [facility]

## 2017-09-14 NOTE — Telephone Encounter (Signed)
Copied from [place] [id number]. Topic: Quick Communication - Rx Refill/Question >> Sep 14, 2017  9:15 AM Ether Griffins B wrote: Medication: omeprazole (PRILOSEC) 20 MG capsule       diltiazem (DILACOR XR) 240 MG 24 hr capsule  Pt has 4 left and it will take a week for mailservice to get them to her.   Has the patient contacted their pharmacy? Yes.   (Agent: If no, request that the patient contact the pharmacy for the refill.) Preferred Pharmacy (with phone number or street name): [facility] [place], [place]: Please be advised that RX refills may take up to 3 business days. We ask that you follow-up with your pharmacy.

## 2017-09-14 NOTE — Telephone Encounter (Signed)
Rx sent in. Pt aware 

## 2017-09-15 ENCOUNTER — Other Ambulatory Visit: Payer: Self-pay | Admitting: Internal Medicine

## 2017-09-15 IMAGING — CR DG CHEST 2V
2 series · 2 of 2 positions shown · non-contrast
Comparison: 10/02/2005

CLINICAL DATA: 82-year-old female with nausea, vomiting, diarrhea
and dysuria for 3 days.

EXAM:
CHEST  2 VIEW

[chest lat]
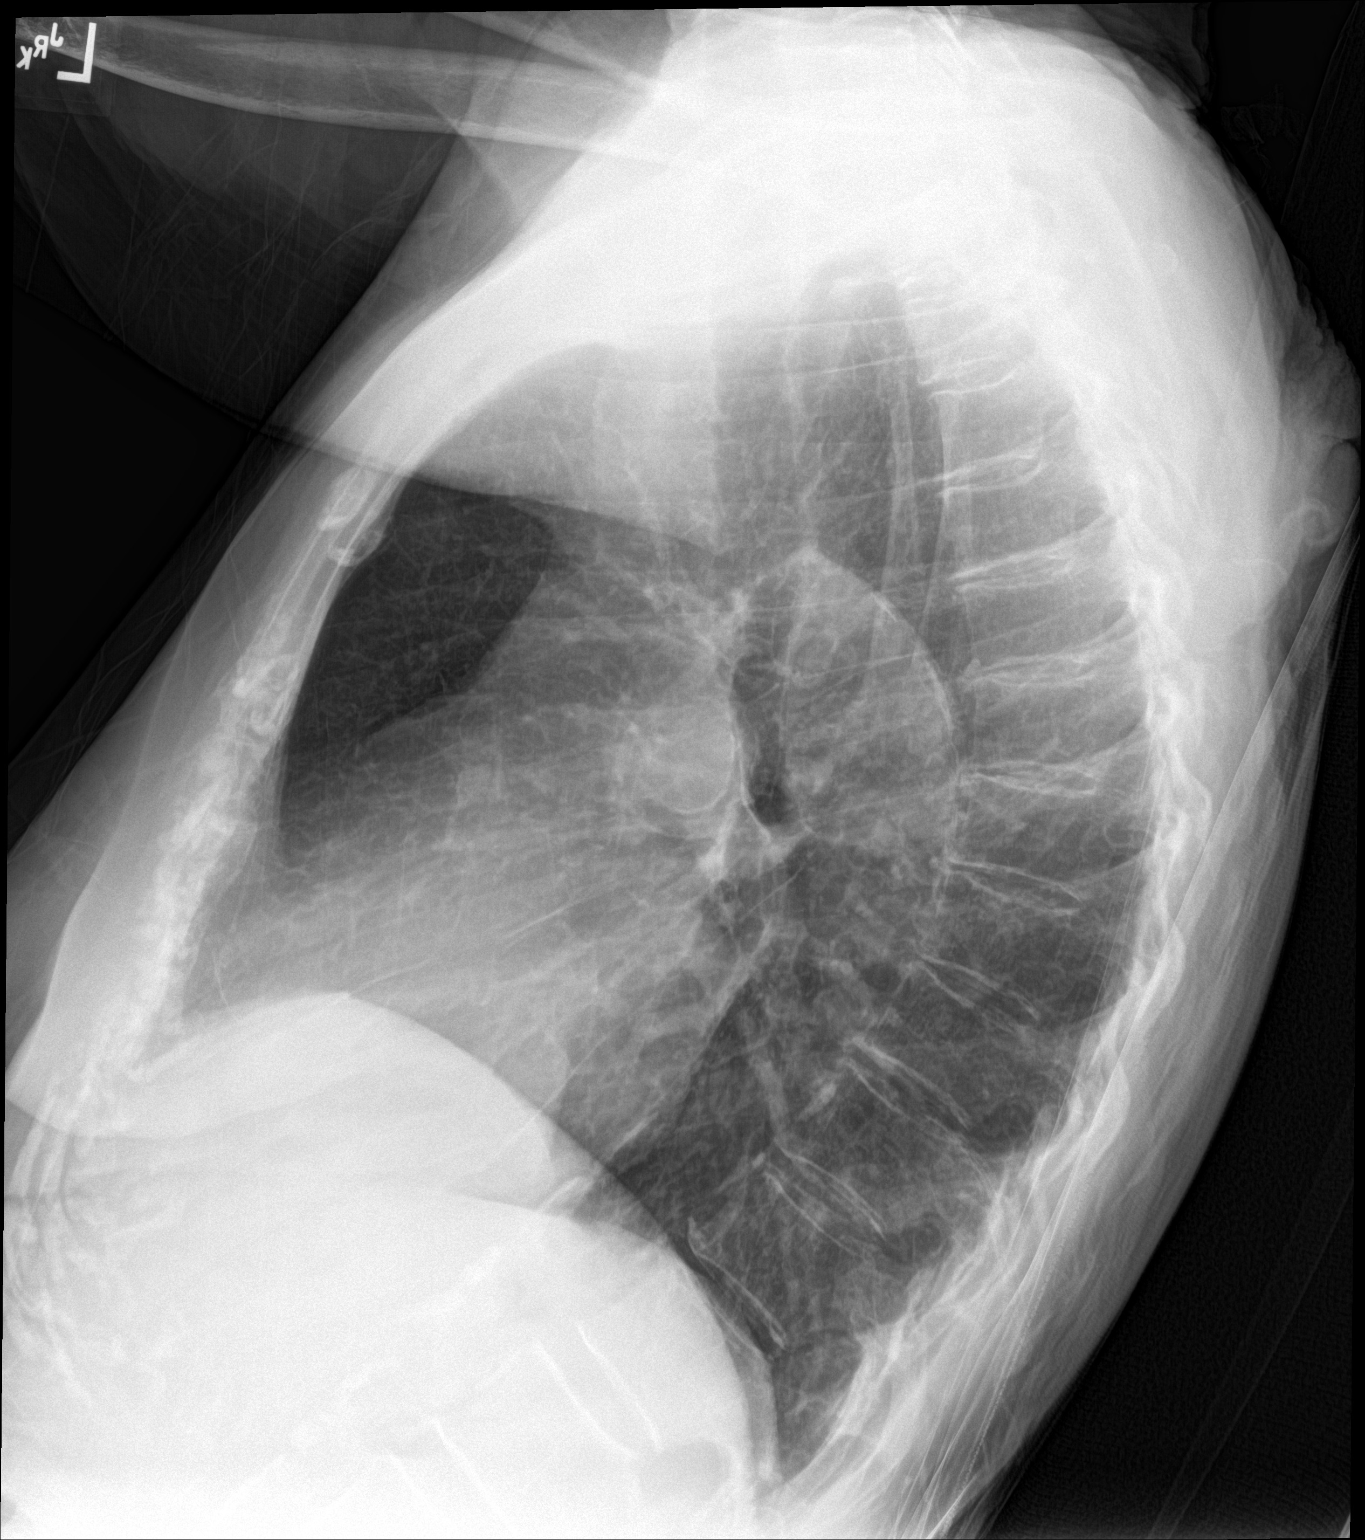

[chest ap]
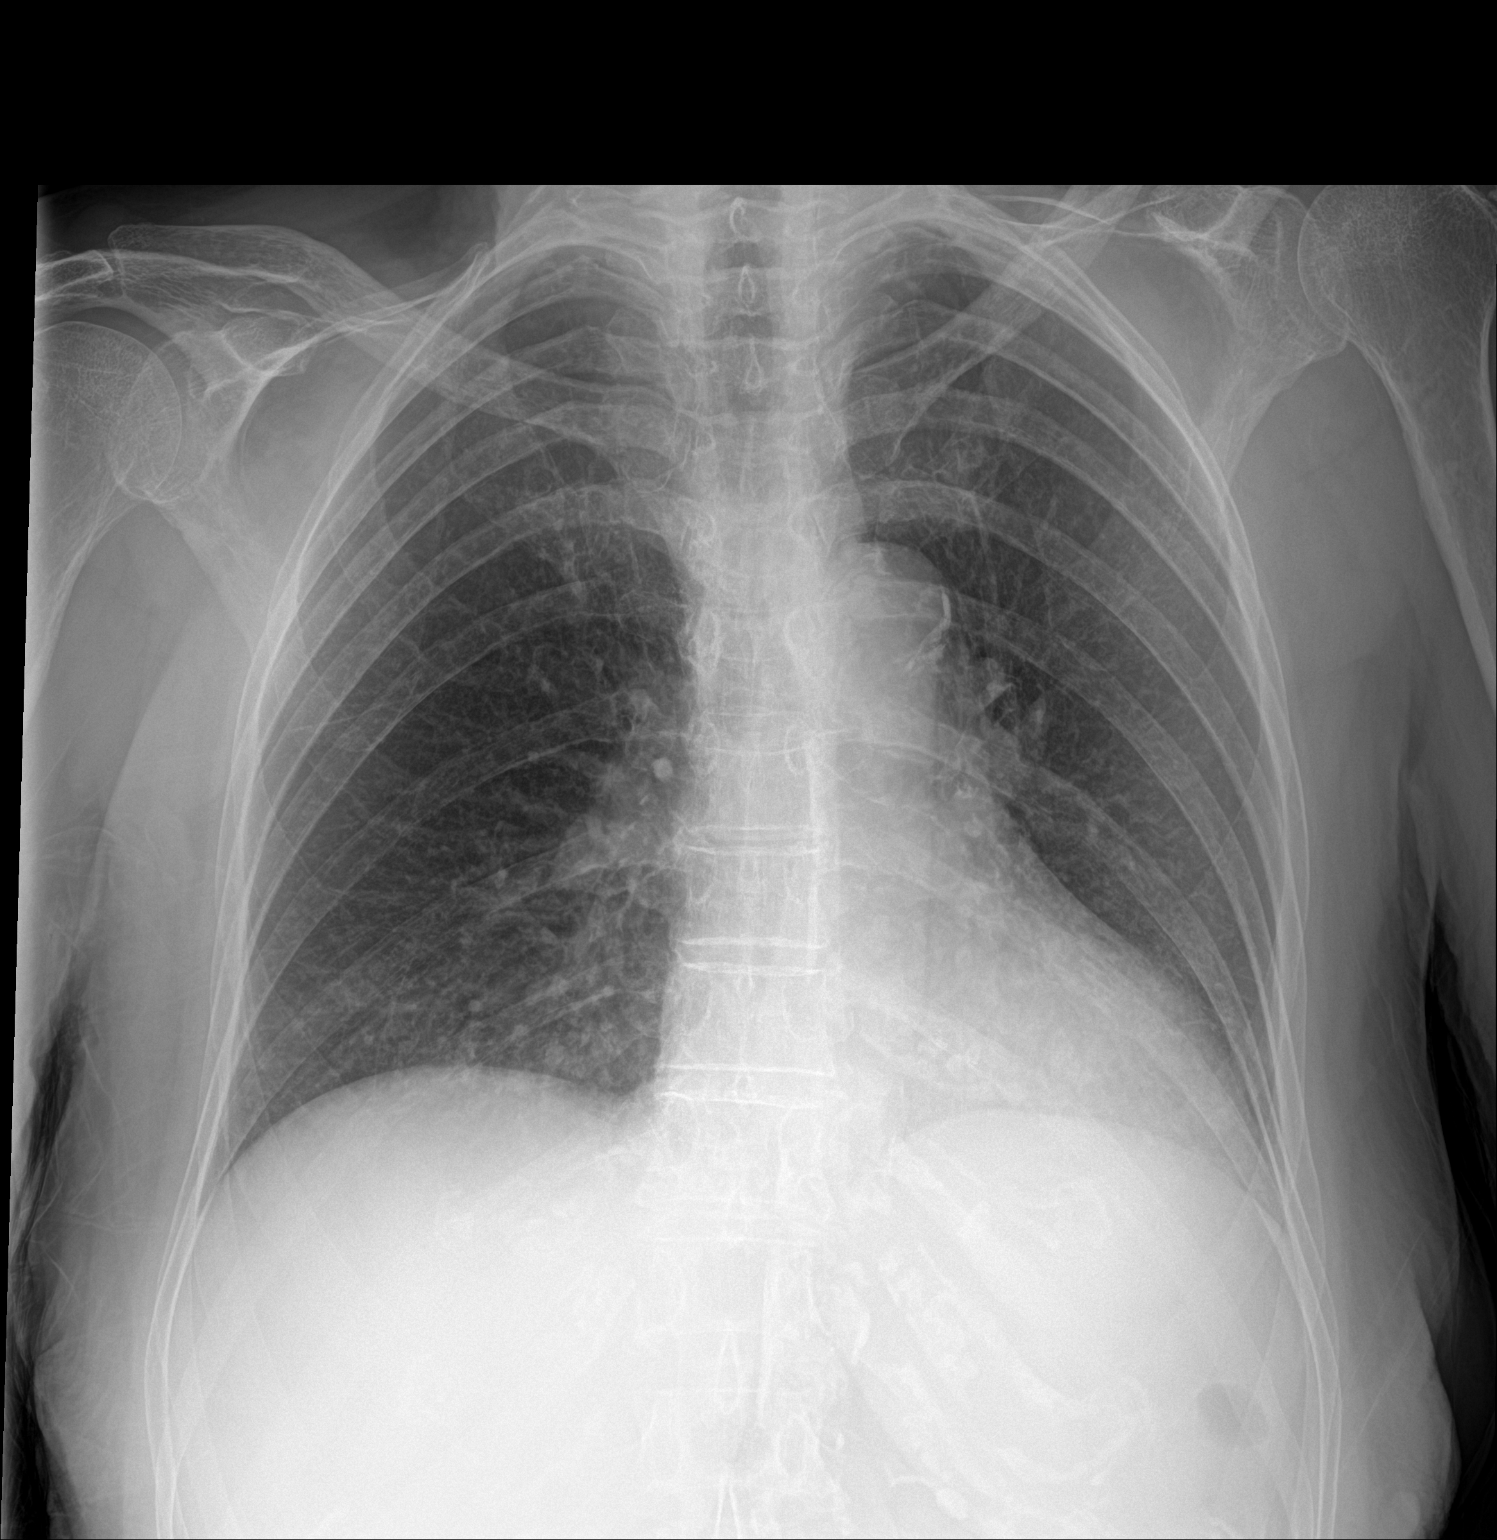

[2 of 2 positions shown; findings below may reference images not displayed]

FINDINGS: The cardiomediastinal silhouette is unremarkable.

There is no evidence of focal airspace disease, pulmonary edema,
suspicious pulmonary nodule/mass, pleural effusion, or pneumothorax.
No acute bony abnormalities are identified.
IMPRESSION: No active cardiopulmonary disease.

## 2017-10-05 DIAGNOSIS — M0609 Rheumatoid arthritis without rheumatoid factor, multiple sites: Secondary | ICD-10-CM | POA: Diagnosis not present

## 2017-10-05 DIAGNOSIS — Z79899 Other long term (current) drug therapy: Secondary | ICD-10-CM | POA: Diagnosis not present

## 2017-10-05 DIAGNOSIS — M0579 Rheumatoid arthritis with rheumatoid factor of multiple sites without organ or systems involvement: Secondary | ICD-10-CM | POA: Diagnosis not present

## 2017-10-05 DIAGNOSIS — M25532 Pain in left wrist: Secondary | ICD-10-CM | POA: Diagnosis not present

## 2017-10-26 ENCOUNTER — Other Ambulatory Visit: Payer: Self-pay | Admitting: Internal Medicine

## 2017-11-24 ENCOUNTER — Other Ambulatory Visit: Payer: Self-pay | Admitting: Internal Medicine

## 2017-12-02 ENCOUNTER — Encounter: Payer: Self-pay | Admitting: Internal Medicine

## 2017-12-02 ENCOUNTER — Encounter

## 2017-12-02 ENCOUNTER — Ambulatory Visit (INDEPENDENT_AMBULATORY_CARE_PROVIDER_SITE_OTHER): Payer: Medicare Other | Admitting: Internal Medicine

## 2017-12-02 VITALS — BP 138/66 | HR 57 | Temp 97.7°F | Resp 18 | Wt 120.2 lb

## 2017-12-02 DIAGNOSIS — R634 Abnormal weight loss: Secondary | ICD-10-CM | POA: Diagnosis not present

## 2017-12-02 DIAGNOSIS — E871 Hypo-osmolality and hyponatremia: Secondary | ICD-10-CM

## 2017-12-02 DIAGNOSIS — E7800 Pure hypercholesterolemia, unspecified: Secondary | ICD-10-CM | POA: Diagnosis not present

## 2017-12-02 DIAGNOSIS — R252 Cramp and spasm: Secondary | ICD-10-CM | POA: Diagnosis not present

## 2017-12-02 DIAGNOSIS — I10 Essential (primary) hypertension: Secondary | ICD-10-CM | POA: Diagnosis not present

## 2017-12-02 DIAGNOSIS — F439 Reaction to severe stress, unspecified: Secondary | ICD-10-CM

## 2017-12-02 DIAGNOSIS — M1990 Unspecified osteoarthritis, unspecified site: Secondary | ICD-10-CM | POA: Diagnosis not present

## 2017-12-02 DIAGNOSIS — D649 Anemia, unspecified: Secondary | ICD-10-CM

## 2017-12-02 DIAGNOSIS — K219 Gastro-esophageal reflux disease without esophagitis: Secondary | ICD-10-CM

## 2017-12-02 LAB — CBC WITH DIFFERENTIAL/PLATELET
Basophils Absolute: 0.2 10*3/uL — ABNORMAL HIGH (ref 0.0–0.1)
Basophils Relative: 2.1 % (ref 0.0–3.0)
EOS PCT: 3 % (ref 0.0–5.0)
Eosinophils Absolute: 0.3 10*3/uL (ref 0.0–0.7)
HEMATOCRIT: 34.9 % — AB (ref 36.0–46.0)
HEMOGLOBIN: 11.7 g/dL — AB (ref 12.0–15.0)
LYMPHS PCT: 48.6 % — AB (ref 12.0–46.0)
Lymphs Abs: 4 10*3/uL (ref 0.7–4.0)
MCHC: 33.4 g/dL (ref 30.0–36.0)
MCV: 93.5 fl (ref 78.0–100.0)
MONOS PCT: 7.7 % (ref 3.0–12.0)
Monocytes Absolute: 0.6 10*3/uL (ref 0.1–1.0)
NEUTROS PCT: 38.6 % — AB (ref 43.0–77.0)
Neutro Abs: 3.2 10*3/uL (ref 1.4–7.7)
Platelets: 358 10*3/uL (ref 150.0–400.0)
RBC: 3.74 Mil/uL — ABNORMAL LOW (ref 3.87–5.11)
RDW: 14.2 % (ref 11.5–15.5)
WBC: 8.3 10*3/uL (ref 4.0–10.5)

## 2017-12-02 LAB — HEPATIC FUNCTION PANEL
ALK PHOS: 85 U/L (ref 39–117)
ALT: 19 U/L (ref 0–35)
AST: 27 U/L (ref 0–37)
Albumin: 4.3 g/dL (ref 3.5–5.2)
Bilirubin, Direct: 0.1 mg/dL (ref 0.0–0.3)
Total Bilirubin: 0.6 mg/dL (ref 0.2–1.2)
Total Protein: 7.3 g/dL (ref 6.0–8.3)

## 2017-12-02 LAB — FERRITIN: Ferritin: 279.2 ng/mL (ref 10.0–291.0)

## 2017-12-02 LAB — BASIC METABOLIC PANEL
BUN: 25 mg/dL — AB (ref 6–23)
CO2: 28 meq/L (ref 19–32)
Calcium: 11.1 mg/dL — ABNORMAL HIGH (ref 8.4–10.5)
Chloride: 100 mEq/L (ref 96–112)
Creatinine, Ser: 1.3 mg/dL — ABNORMAL HIGH (ref 0.40–1.20)
GFR: 41.61 mL/min — ABNORMAL LOW (ref >60.00)
GLUCOSE: 93 mg/dL (ref 70–99)
Potassium: 3.6 mEq/L (ref 3.5–5.1)
SODIUM: 137 meq/L (ref 135–145)

## 2017-12-02 LAB — TSH: TSH: 1.51 u[IU]/mL (ref 0.35–4.50)

## 2017-12-02 LAB — MAGNESIUM: Magnesium: 1.8 mg/dL (ref 1.5–2.5)

## 2017-12-02 NOTE — Progress Notes (Signed)
Patient ID: Stacey Snyder, female   DOB: 05/11/1932, 84 y.o.   MRN: [id number]   Subjective:    Patient ID: Stacey Snyder, female    DOB: 1932-06-19, 84 y.o.   MRN: [id number]  HPI  Patient here for a scheduled follow up.  She reports she is doing better.  States first two weeks of July, she developed fever with Tmax of 102.  Also had cough and congestion.  Pulled her low back.  Some discomfort with this.  Overall now feeling better.  No fever.  No headache.  No increased cough or congestion.  No sob.  No acid reflux.  No abdominal pain.  Bowels moving.  Has noticed increased muscle cramps.  Taking magoxide.  Weight is down some.  States she is eating well.  Better now.  Taking gabapentin.  Helping.     Past Medical History:  Diagnosis Date  . Diverticulosis   . Fibrocystic breast disease   . GERD (gastroesophageal reflux disease)   . History of kidney stones   . Hypercholesterolemia   . Hypertension   . Hypertension   . IBS (irritable bowel syndrome)   . Inflammatory arthritis    positive anti-CCP abs, s/p prednisone, MTX, Remicade  . Nephrolithiasis   . Osteopenia    GI upset with Fosamax  . Pancreatitis 2007   s/p ERCP  . Pancreatitis   . PONV (postoperative nausea and vomiting)   . Renal insufficiency   . Spinal stenosis of lumbosacral region    Past Surgical History:  Procedure Laterality Date  . ABDOMINAL HYSTERECTOMY  1993   ovaries not removed  . BREAST BIOPSY Left 1984   negative  . BREAST CYST EXCISION Left 2010   negative  . LITHOTRIPSY    . LUMBAR LAMINECTOMY  1983  . LUMBAR LAMINECTOMY/DECOMPRESSION MICRODISCECTOMY N/A 08/18/2016   Procedure: LUMBAR LAMINECTOMY/DECOMPRESSION MICRODISCECTOMY 1 LEVEL;  Surgeon: Bayard Hugger, MD;  Location: [facility] ORS;  Service: Neurosurgery;  Laterality: N/A;  L4-5 Laminectomy, MIS, L5 foraminotomy  . TRIGGER FINGER RELEASE     right ring finger   Family History  Problem Relation Age of Onset  . Ovarian cancer  Mother   . Renal Disease Mother   . Heart disease Mother   . Diabetes Mother   . Hypertension Mother   . Breast cancer Neg Hx    Social History   Socioeconomic History  . Marital status: Married    Spouse name: Not on file  . Number of children: 4  . Years of education: Not on file  . Highest education level: Not on file  Occupational History  . Not on file  Social Needs  . Financial resource strain: Not on file  . Food insecurity:    Worry: Not on file    Inability: Not on file  . Transportation needs:    Medical: Not on file    Non-medical: Not on file  Tobacco Use  . Smoking status: Never Smoker  . Smokeless tobacco: Never Used  Substance and Sexual Activity  . Alcohol use: No    Alcohol/week: 0.0 oz  . Drug use: No  . Sexual activity: Not on file  Lifestyle  . Physical activity:    Days per week: Not on file    Minutes per session: Not on file  . Stress: Not on file  Relationships  . Social connections:    Talks on phone: Not on file    Gets together: Not on file  Attends religious service: Not on file    Active member of club or organization: Not on file    Attends meetings of clubs or organizations: Not on file    Relationship status: Not on file  Other Topics Concern  . Not on file  Social History Narrative  . Not on file    Outpatient Encounter Medications as of 12/02/2017  Medication Sig  . acyclovir (ZOVIRAX) 400 MG tablet As directed  . Cholecalciferol (VITAMIN D) 2000 UNITS tablet Take 2,000 Units by mouth daily.  Marland Kitchen diltiazem (DILACOR XR) 240 MG 24 hr capsule Take 1 capsule (240 mg total) by mouth daily.  . fluticasone (FLONASE) 50 MCG/ACT nasal spray USE TWO SPRAY(S) IN EACH NOSTRIL ONCE DAILY  . gabapentin (NEURONTIN) 300 MG capsule Take 1 capsule (300 mg total) by mouth at bedtime.  . InFLIXimab (REMICADE IV) Inject into the vein every 6 (six) weeks. Per Dr Jefm Bryant  . losartan (COZAAR) 100 MG tablet TAKE 1 TABLET DAILY  . lovastatin  (MEVACOR) 40 MG tablet Take 1 tablet (40 mg total) by mouth daily.  . methotrexate 25 MG/ML SOLN Inject 17.5 mg into the skin once a week. Patient takes 17.5 mg (0.7 ml) on Monday or Tuesday of each week.  Marland Kitchen omeprazole (PRILOSEC) 20 MG capsule Take 1 capsule (20 mg total) by mouth daily.  . ondansetron (ZOFRAN) 4 MG tablet Take 1 tablet (4 mg total) by mouth every 6 (six) hours as needed for nausea or vomiting.  . polyethylene glycol (MIRALAX / GLYCOLAX) packet Take 17 g by mouth daily.  . psyllium (METAMUCIL) 58.6 % packet Take 1 packet by mouth daily.  Marland Kitchen triamterene-hydrochlorothiazide (MAXZIDE-25) 37.5-25 MG tablet TAKE 1 TABLET DAILY  . [DISCONTINUED] losartan (COZAAR) 100 MG tablet Take 1 tablet (100 mg total) by mouth daily.  . [DISCONTINUED] triamterene-hydrochlorothiazide (MAXZIDE-25) 37.5-25 MG tablet Take 1 tablet by mouth daily.   No facility-administered encounter medications on file as of 12/02/2017.     Review of Systems  Constitutional: Negative for appetite change.       Eating better now.  Weight is down a few pounds.    HENT: Negative for congestion and sinus pressure.   Respiratory: Negative for chest tightness and shortness of breath.        No increased cough now.    Cardiovascular: Negative for chest pain, palpitations and leg swelling.  Gastrointestinal: Negative for abdominal pain, diarrhea, nausea and vomiting.  Genitourinary: Negative for difficulty urinating and dysuria.  Musculoskeletal: Negative for joint swelling and myalgias.  Skin: Negative for color change and rash.  Neurological: Negative for dizziness, light-headedness and headaches.  Psychiatric/Behavioral: Negative for agitation and dysphoric mood.       Objective:    Physical Exam  Constitutional: She appears well-developed and well-nourished. No distress.  HENT:  Nose: Nose normal.  Mouth/Throat: Oropharynx is clear and moist.  Neck: Neck supple. No thyromegaly present.  Cardiovascular:  Normal rate and regular rhythm.  Pulmonary/Chest: Breath sounds normal. No respiratory distress. She has no wheezes.  Abdominal: Soft. Bowel sounds are normal. There is no tenderness.  Musculoskeletal: She exhibits no edema or tenderness.  Lymphadenopathy:    She has no cervical adenopathy.  Skin: No rash noted. No erythema.  Psychiatric: She has a normal mood and affect. Her behavior is normal.    BP 138/66 (BP Location: Left Arm, Patient Position: Sitting, Cuff Size: Normal)   Pulse (!) 57   Temp 97.7 F (36.5 C) (Oral)   Resp  18   Wt 120 lb 3.2 oz (54.5 kg)   SpO2 98%   BMI 22.71 kg/m  Wt Readings from Last 3 Encounters:  12/02/17 120 lb 3.2 oz (54.5 kg)  07/23/17 124 lb 9.6 oz (56.5 kg)  04/21/17 123 lb 3.2 oz (55.9 kg)     Lab Results  Component Value Date   WBC 8.3 12/02/2017   HGB 11.7 (L) 12/02/2017   HCT 34.9 (L) 12/02/2017   PLT 358.0 12/02/2017   GLUCOSE 93 12/02/2017   CHOL 180 04/21/2017   TRIG 96.0 04/21/2017   HDL 74.80 04/21/2017   LDLCALC 86 04/21/2017   ALT 19 12/02/2017   AST 27 12/02/2017   NA 137 12/02/2017   K 3.6 12/02/2017   CL 100 12/02/2017   CREATININE 1.30 (H) 12/02/2017   BUN 25 (H) 12/02/2017   CO2 28 12/02/2017   TSH 1.51 12/02/2017   INR 0.92 08/07/2016       Assessment & Plan:   Problem List Items Addressed This Visit    Anemia    Recheck cbc with ferritin.        Relevant Orders   Ferritin (Completed)   Cramps, extremity    Increased cramps.  She has been staying hydrated.  Taking magoxide.  Check electrolytes and magnesium level.        Relevant Orders   Magnesium (Completed)   GERD (gastroesophageal reflux disease)    Controlled on omeprazole.        Hypercholesterolemia - Primary    On lovastatin.  Low cholesterol diet and exercise. Follow lipid panel and liver function tests.        Relevant Orders   Hepatic function panel (Completed)   Hypertension    Blood pressure is better.  Same medication.  Follow  pressures.  Follow metabolic panel.        Relevant Orders   CBC with Differential/Platelet (Completed)   TSH (Completed)   Basic metabolic panel (Completed)   Hyponatremia    On triam/hctz.  Only taking 1/2 tablet.  Follow pressures.  rehceck metabolic panel.        Inflammatory arthritis ([place])    Followed by Dr Jefm Bryant.  Receiving remicade.  Stable.        Loss of weight    Had some weight loss recently.  Has been sick.  Better now.  Eating better.  Follow.        Stress    Increased stress.  Discussed with her today.  Does not feel needs any further intervention.  Follow.            Einar Pheasant, MD

## 2017-12-03 ENCOUNTER — Other Ambulatory Visit: Payer: Self-pay | Admitting: Internal Medicine

## 2017-12-03 DIAGNOSIS — N183 Chronic kidney disease, stage 3 unspecified: Secondary | ICD-10-CM

## 2017-12-03 NOTE — Progress Notes (Signed)
Order placed for f/u labs.  

## 2017-12-06 ENCOUNTER — Encounter: Payer: Self-pay | Admitting: Internal Medicine

## 2017-12-06 NOTE — Assessment & Plan Note (Signed)
Had some weight loss recently.  Has been sick.  Better now.  Eating better.  Follow.

## 2017-12-06 NOTE — Assessment & Plan Note (Signed)
Controlled on omeprazole.   

## 2017-12-06 NOTE — Assessment & Plan Note (Signed)
On triam/hctz.  Only taking 1/2 tablet.  Follow pressures.  rehceck metabolic panel.

## 2017-12-06 NOTE — Assessment & Plan Note (Signed)
Increased cramps.  She has been staying hydrated.  Taking magoxide.  Check electrolytes and magnesium level.

## 2017-12-06 NOTE — Assessment & Plan Note (Signed)
Increased stress.  Discussed with her today.  Does not feel needs any further intervention.  Follow.

## 2017-12-06 NOTE — Assessment & Plan Note (Signed)
Followed by Dr Jefm Bryant.  Receiving remicade.  Stable.

## 2017-12-06 NOTE — Assessment & Plan Note (Signed)
Blood pressure is better.  Same medication.  Follow pressures.  Follow metabolic panel.

## 2017-12-06 NOTE — Assessment & Plan Note (Signed)
Recheck cbc with ferritin.

## 2017-12-06 NOTE — Assessment & Plan Note (Signed)
On lovastatin.  Low cholesterol diet and exercise.  Follow lipid panel and liver function tests.   

## 2017-12-08 ENCOUNTER — Other Ambulatory Visit (INDEPENDENT_AMBULATORY_CARE_PROVIDER_SITE_OTHER): Payer: Medicare Other

## 2017-12-08 DIAGNOSIS — N183 Chronic kidney disease, stage 3 unspecified: Secondary | ICD-10-CM

## 2017-12-08 LAB — BASIC METABOLIC PANEL
BUN: 22 mg/dL (ref 6–23)
CALCIUM: 10.3 mg/dL (ref 8.4–10.5)
CO2: 28 mEq/L (ref 19–32)
Chloride: 99 mEq/L (ref 96–112)
Creatinine, Ser: 1.22 mg/dL — ABNORMAL HIGH (ref 0.40–1.20)
GFR: 44.78 mL/min — AB (ref >60.00)
GLUCOSE: 96 mg/dL (ref 70–99)
Potassium: 3.5 mEq/L (ref 3.5–5.1)
SODIUM: 136 meq/L (ref 135–145)

## 2017-12-08 NOTE — Addendum Note (Signed)
Addended by: Leeanne Rio on: 12/08/2017 11:00 AM   Modules accepted: Orders

## 2017-12-10 DIAGNOSIS — H4043X4 Glaucoma secondary to eye inflammation, bilateral, indeterminate stage: Secondary | ICD-10-CM | POA: Diagnosis not present

## 2017-12-10 LAB — PARATHYROID HORMONE, INTACT (NO CA): PTH: 11 pg/mL — AB (ref 14–64)

## 2017-12-11 ENCOUNTER — Other Ambulatory Visit: Payer: Self-pay | Admitting: Radiology

## 2017-12-11 DIAGNOSIS — H4043X4 Glaucoma secondary to eye inflammation, bilateral, indeterminate stage: Secondary | ICD-10-CM | POA: Diagnosis not present

## 2017-12-14 ENCOUNTER — Telehealth: Payer: Self-pay | Admitting: Internal Medicine

## 2017-12-14 ENCOUNTER — Telehealth: Payer: Self-pay

## 2017-12-14 DIAGNOSIS — Z79899 Other long term (current) drug therapy: Secondary | ICD-10-CM | POA: Diagnosis not present

## 2017-12-14 DIAGNOSIS — M0579 Rheumatoid arthritis with rheumatoid factor of multiple sites without organ or systems involvement: Secondary | ICD-10-CM | POA: Diagnosis not present

## 2017-12-14 NOTE — Telephone Encounter (Signed)
FYI

## 2017-12-14 NOTE — Telephone Encounter (Signed)
Copied from [place] [id number]. Topic: General - Other >> Dec 14, 2017  8:48 AM Reyne Dumas L wrote: Reason for CRM:  Pt states that after her lab work last week she felt very bad - states that her eye ball began to hurt and she started to see black spots.  Pt states that she went to [facility] and was told the pressure in her eyes was 46 on left eye and 36 on right eye, she was told the pressure was supposed to be in the 20s.  Pt is going back for multiple visits there until pressure comes down - pt was put on Prednisone and eye drops 4 times a day.  Pt wants to make sure that the physician is aware of this. Pt states she doesn't need a call back from the office she just wants to report this.

## 2017-12-14 NOTE — Telephone Encounter (Signed)
Noted.  Thanks for the update and let her know to let us know if she needs anything.

## 2017-12-14 NOTE — Telephone Encounter (Signed)
Patient is being followed by [facility]. This is just an [place]

## 2017-12-14 NOTE — Telephone Encounter (Signed)
Copied from [place] [id number]. Topic: General - Other >> Dec 14, 2017  8:48 AM Stacey Snyder wrote: Reason for CRM:  Pt states that after her lab work last week she felt very bad - states that her eye ball began to hurt and she started to see black spots.  Pt states that she went to [facility] and was told the pressure in her eyes was 46 on left eye and 36 on right eye, she was told the pressure was supposed to be in the 20s.  Pt is going back for multiple visits there until pressure comes down - pt was put on Prednisone and eye drops 4 times a day.  Pt wants to make sure that the physician is aware of this. Pt states she doesn't need a call back from the office she just wants to report this.

## 2017-12-15 LAB — PROTEIN ELECTROPHORESIS, SERUM
ALPHA 1: 0.3 g/dL (ref 0.2–0.3)
ALPHA 2: 0.6 g/dL (ref 0.5–0.9)
Albumin ELP: 4 g/dL (ref 3.8–4.8)
BETA GLOBULIN: 0.4 g/dL (ref 0.4–0.6)
Beta 2: 0.3 g/dL (ref 0.2–0.5)
GAMMA GLOBULIN: 1 g/dL (ref 0.8–1.7)
Total Protein: 6.7 g/dL (ref 6.1–8.1)

## 2017-12-15 LAB — CALCIUM, IONIZED: CALCIUM ION: 5.37 mg/dL (ref 4.8–5.6)

## 2017-12-15 LAB — TEST AUTHORIZATION

## 2017-12-15 LAB — IFE INTERPRETATION

## 2017-12-15 LAB — PARATHYROID HORMONE, INTACT (NO CA)

## 2017-12-16 NOTE — Telephone Encounter (Signed)
-----  Message from Cammie Sickle, MD sent at 12/16/2017  2:11 PM EDT ----- Regarding: RE: lab question Thank you for reaching out to me. I reviewed the labs-such low levels of m-proetin UNlikley suggestive of multiple myeloma. However, recommend serum protein immunofixation; and also kappa/lamda light chains/skeletal survey it rule out multiple myeloma. Thx GB    ----- Message ----- From: Einar Pheasant, MD Sent: 12/16/2017   4:46 AM EDT To: Cammie Sickle, MD Subject: lab question                                   Ms Hammill has a past history of rheumatoid arthritis and hypertension.  She was noted to have some renal insufficiency and a slightly elevated calcium.  Her intact PTH was low.  I ordered a serum protein electrophoresis.  Her results are in the system.  Do you mind looking at the labs and letting me know the best follow up.    Thank you for your help.   Einar Pheasant

## 2017-12-16 NOTE — Telephone Encounter (Signed)
Will have her see hematology for further w/up.

## 2017-12-17 DIAGNOSIS — H4043X4 Glaucoma secondary to eye inflammation, bilateral, indeterminate stage: Secondary | ICD-10-CM | POA: Diagnosis not present

## 2017-12-25 DIAGNOSIS — H4043X4 Glaucoma secondary to eye inflammation, bilateral, indeterminate stage: Secondary | ICD-10-CM | POA: Diagnosis not present

## 2017-12-29 ENCOUNTER — Other Ambulatory Visit: Payer: Self-pay | Admitting: Internal Medicine

## 2017-12-29 DIAGNOSIS — R944 Abnormal results of kidney function studies: Secondary | ICD-10-CM

## 2017-12-29 NOTE — Progress Notes (Signed)
Order placed for f/u met b.  

## 2018-01-01 DIAGNOSIS — H4043X4 Glaucoma secondary to eye inflammation, bilateral, indeterminate stage: Secondary | ICD-10-CM | POA: Diagnosis not present

## 2018-01-06 ENCOUNTER — Other Ambulatory Visit: Payer: Self-pay | Admitting: Internal Medicine

## 2018-01-06 NOTE — Telephone Encounter (Signed)
Copied from [place] [id number]. Topic: Quick Communication - Rx Refill/Question >> Jan 06, 2018  3:18 PM Sheppard Coil, [employer] wrote: Medication: gabapentin (NEURONTIN) 300 MG capsule  Has the patient contacted their pharmacy? Yes - states they haven't heard from [place] (Agent: If no, request that the patient contact the pharmacy for the refill.) (Agent: If yes, when and what did the pharmacy advise?)  Preferred Pharmacy (with phone number or street name): [facility], [place] to [facility] [employer] [facility] [phone] (Phone) [phone] (Fax)  Agent: Please be advised that RX refills may take up to 3 business days. We ask that you follow-up with your pharmacy.

## 2018-01-06 NOTE — Telephone Encounter (Signed)
Refill of Neurontin  LOV 12/02/17 Dr. Nicki Reaper  [facility] 07/23/17  #90  1 refill  [facility] [place] [place] [facility] [employer] [facility]            [phone] (Phone) [phone] (Fax)

## 2018-01-11 ENCOUNTER — Other Ambulatory Visit: Payer: Self-pay | Admitting: Internal Medicine

## 2018-01-11 DIAGNOSIS — R778 Other specified abnormalities of plasma proteins: Secondary | ICD-10-CM

## 2018-01-11 MED ORDER — GABAPENTIN 300 MG PO CAPS: 300.0000 mg | ORAL_CAPSULE | Freq: Every day | ORAL | 1 refills | Status: DC

## 2018-01-11 NOTE — Telephone Encounter (Signed)
rx request 

## 2018-01-11 NOTE — Progress Notes (Signed)
Order placed for hematology referral.  

## 2018-01-13 DIAGNOSIS — H4043X4 Glaucoma secondary to eye inflammation, bilateral, indeterminate stage: Secondary | ICD-10-CM | POA: Diagnosis not present

## 2018-01-19 ENCOUNTER — Encounter: Payer: Self-pay | Admitting: Internal Medicine

## 2018-01-19 ENCOUNTER — Inpatient Hospital Stay: Payer: Medicare Other | Attending: Internal Medicine | Admitting: Internal Medicine

## 2018-01-19 DIAGNOSIS — D472 Monoclonal gammopathy: Secondary | ICD-10-CM

## 2018-01-19 DIAGNOSIS — M069 Rheumatoid arthritis, unspecified: Secondary | ICD-10-CM

## 2018-01-19 DIAGNOSIS — N183 Chronic kidney disease, stage 3 (moderate): Secondary | ICD-10-CM

## 2018-01-19 DIAGNOSIS — D649 Anemia, unspecified: Secondary | ICD-10-CM | POA: Diagnosis not present

## 2018-01-19 DIAGNOSIS — I129 Hypertensive chronic kidney disease with stage 1 through stage 4 chronic kidney disease, or unspecified chronic kidney disease: Secondary | ICD-10-CM

## 2018-01-19 NOTE — Assessment & Plan Note (Addendum)
#  MGUS-no quantifiable protein noted on protein electrophoresis however poorly-defined band of restricted protein mobility is detected in the gamma globulins. It is unlikely that this may represent a monoclonal protein; however, immunofixation analysis is indicated for further evaluation.  #Patient has chronic kidney disease; mild anemia [fairly steady; not getting any worse]-no evidence of ongoing hypercalcemia-some concern for any multiple myeloma-like disease is small.  I would recommend kappa lambda light chain ratio immunofixation in approximately 6 months.  # Mild Anemia sec to RA.  Hemoglobin around 11.  Stable.  # CKD- III-creatinine 1.2 -1.3.  STABLE.   # RA- on infliximab/mxt SQ [Dr.Kernodle]  # follow up in 6 months; labs-1 week prior [cbc/cmp/iron studies/ferritin; Multiple myeloma panel; K/l light chains]  # Thank you Dr. for allowing me to participate in the care of your pleasant patient. Please do not hesitate to contact me with questions or concerns in the interim.

## 2018-01-19 NOTE — Progress Notes (Signed)
[facility] CONSULT NOTE  Patient Care Team: Einar Pheasant, MD as PCP - General (Internal Medicine)  CHIEF COMPLAINTS/PURPOSE OF CONSULTATION: MGUS  # MGUS- SPEP- NEG; but poorly-defined band of restricted protein mobility is detected in the gamma globulins.   #Rheumatoid arthritis [SQ MXT; Humira; Dr.Kernodle]; CKD stage III;    No history exists.   HISTORY OF PRESENTING ILLNESS:  Stacey Snyder 84 y.o.  female long-standing history of rheumatoid arthritis has been referred to [place] for further evaluation of monoclonal protein.  Patient denies any worsening bone pain.  Patient complains of mild to moderate fatigue going on for the last many months.  Denies any worsening tingling or numbness in extremities.  No nausea no vomiting.  No weight loss.   Review of Systems  Constitutional: Positive for malaise/fatigue. Negative for chills, diaphoresis, fever and weight loss.  HENT: Negative for nosebleeds and sore throat.   Eyes: Negative for double vision.  Respiratory: Negative for cough, hemoptysis, sputum production, shortness of breath and wheezing.   Cardiovascular: Negative for chest pain, palpitations, orthopnea and leg swelling.  Gastrointestinal: Negative for abdominal pain, blood in stool, constipation, diarrhea, heartburn, melena, nausea and vomiting.  Genitourinary: Negative for dysuria, frequency and urgency.  Musculoskeletal: Positive for back pain and joint pain.  Skin: Negative.  Negative for itching and rash.  Neurological: Negative for dizziness, tingling, focal weakness, weakness and headaches.  Endo/Heme/Allergies: Does not bruise/bleed easily.  Psychiatric/Behavioral: Negative for depression. The patient is not nervous/anxious and does not have insomnia.      MEDICAL HISTORY:  Past Medical History:  Diagnosis Date  . Diverticulosis   . Fibrocystic breast disease   . GERD (gastroesophageal reflux disease)   . History of kidney stones   .  Hypercholesterolemia   . Hypertension   . Hypertension   . IBS (irritable bowel syndrome)   . Inflammatory arthritis    positive anti-CCP abs, s/p prednisone, MTX, Remicade  . Nephrolithiasis   . Osteopenia    GI upset with Fosamax  . Pancreatitis 2007   s/p ERCP  . Pancreatitis   . PONV (postoperative nausea and vomiting)   . Renal insufficiency   . Spinal stenosis of lumbosacral region     SURGICAL HISTORY: Past Surgical History:  Procedure Laterality Date  . ABDOMINAL HYSTERECTOMY  1993   ovaries not removed  . BREAST BIOPSY Left 1984   negative  . BREAST CYST EXCISION Left 2010   negative  . LITHOTRIPSY    . LUMBAR LAMINECTOMY  1983  . LUMBAR LAMINECTOMY/DECOMPRESSION MICRODISCECTOMY N/A 08/18/2016   Procedure: LUMBAR LAMINECTOMY/DECOMPRESSION MICRODISCECTOMY 1 LEVEL;  Surgeon: Bayard Hugger, MD;  Location: [facility] ORS;  Service: Neurosurgery;  Laterality: N/A;  L4-5 Laminectomy, MIS, L5 foraminotomy  . TRIGGER FINGER RELEASE     right ring finger    SOCIAL HISTORY: Social History   Socioeconomic History  . Marital status: Married    Spouse name: Not on file  . Number of children: 4  . Years of education: Not on file  . Highest education level: Not on file  Occupational History  . Not on file  Social Needs  . Financial resource strain: Not on file  . Food insecurity:    Worry: Not on file    Inability: Not on file  . Transportation needs:    Medical: Not on file    Non-medical: Not on file  Tobacco Use  . Smoking status: Never Smoker  . Smokeless tobacco: Never Used  Substance and Sexual Activity  . Alcohol use: No    Alcohol/week: 0.0 standard drinks  . Drug use: No  . Sexual activity: Not on file  Lifestyle  . Physical activity:    Days per week: Not on file    Minutes per session: Not on file  . Stress: Not on file  Relationships  . Social connections:    Talks on phone: Not on file    Gets together: Not on file    Attends religious service: Not  on file    Active member of club or organization: Not on file    Attends meetings of clubs or organizations: Not on file    Relationship status: Not on file  . Intimate partner violence:    Fear of current or ex partner: Not on file    Emotionally abused: Not on file    Physically abused: Not on file    Forced sexual activity: Not on file  Other Topics Concern  . Not on file  Social History Narrative  . Not on file    FAMILY HISTORY: Family History  Problem Relation Age of Onset  . Ovarian cancer Mother   . Renal Disease Mother   . Heart disease Mother   . Diabetes Mother   . Hypertension Mother   . Breast cancer Neg Hx     ALLERGIES:  is allergic to carisoprodol; codeine; flagyl [metronidazole]; nsaids; skelaxin [metaxalone]; tenormin [atenolol]; and tramadol.  MEDICATIONS:  Current Outpatient Medications  Medication Sig Dispense Refill  . acyclovir (ZOVIRAX) 400 MG tablet As directed 30 tablet 0  . Cholecalciferol (VITAMIN D) 2000 UNITS tablet Take 2,000 Units by mouth daily.    Marland Kitchen diltiazem (DILACOR XR) 240 MG 24 hr capsule Take 1 capsule (240 mg total) by mouth daily. 90 capsule 1  . gabapentin (NEURONTIN) 300 MG capsule Take 1 capsule (300 mg total) by mouth at bedtime. 90 capsule 1  . InFLIXimab (REMICADE IV) Inject into the vein every 6 (six) weeks. Per Dr Jefm Bryant    . losartan (COZAAR) 100 MG tablet TAKE 1 TABLET DAILY 90 tablet 3  . lovastatin (MEVACOR) 40 MG tablet Take 1 tablet (40 mg total) by mouth daily. 90 tablet 1  . methotrexate 25 MG/ML SOLN Inject 17.5 mg into the skin once a week. Patient takes 17.5 mg (0.7 ml) on Monday or Tuesday of each week.    Marland Kitchen omeprazole (PRILOSEC) 20 MG capsule Take 1 capsule (20 mg total) by mouth daily. 90 capsule 1  . triamterene-hydrochlorothiazide (MAXZIDE-25) 37.5-25 MG tablet TAKE 1 TABLET DAILY 90 tablet 0  . dorzolamide-timolol (COSOPT) 22.3-6.8 MG/ML ophthalmic solution INSTILL 1 DROP INTO EACH EYE TWICE DAILY  5  .  fluticasone (FLONASE) 50 MCG/ACT nasal spray USE TWO SPRAY(S) IN EACH NOSTRIL ONCE DAILY (Patient not taking: Reported on 4/70/9628) 16 g 11  . folic acid (FOLVITE) 1 MG tablet Take 1 mg by mouth 2 (two) times daily.  1  . latanoprost (XALATAN) 0.005 % ophthalmic solution     . ondansetron (ZOFRAN) 4 MG tablet Take 1 tablet (4 mg total) by mouth every 6 (six) hours as needed for nausea or vomiting. (Patient not taking: Reported on 01/19/2018) 20 tablet 0  . polyethylene glycol (MIRALAX / GLYCOLAX) packet Take 17 g by mouth daily.    . psyllium (METAMUCIL) 58.6 % packet Take 1 packet by mouth daily.     No current facility-administered medications for this visit.       Marland Kitchen  PHYSICAL EXAMINATION: ECOG PERFORMANCE STATUS: 1 - Symptomatic but completely ambulatory  Vitals:   01/19/18 1313  BP: (!) 177/66  Pulse: (!) 53  Resp: 16  Temp: (!) 97.3 F (36.3 C)   Filed Weights   01/19/18 1313  Weight: 119 lb 0.8 oz (54 kg)    Physical Exam  Constitutional: She is oriented to person, place, and time and well-developed, well-nourished, and in no distress.  HENT:  Head: Normocephalic and atraumatic.  Mouth/Throat: Oropharynx is clear and moist. No oropharyngeal exudate.  Eyes: Pupils are equal, round, and reactive to light.  Neck: Normal range of motion. Neck supple.  Cardiovascular: Normal rate and regular rhythm.  Pulmonary/Chest: No respiratory distress. She has no wheezes.  Abdominal: Soft. Bowel sounds are normal. She exhibits no distension and no mass. There is no tenderness. There is no rebound and no guarding.  Musculoskeletal: Normal range of motion. She exhibits no edema or tenderness.  Neurological: She is alert and oriented to person, place, and time.  Skin: Skin is warm.  Psychiatric: Affect normal.     LABORATORY DATA:  I have reviewed the data as listed Lab Results  Component Value Date   WBC 8.3 12/02/2017   HGB 11.7 (L) 12/02/2017   HCT 34.9 (L) 12/02/2017   MCV  93.5 12/02/2017   PLT 358.0 12/02/2017   Recent Labs    04/21/17 0945  08/11/17 1326 12/02/17 0943 12/08/17 1100  NA 137   < > 133* 137 136  K 3.3*   < > 3.9 3.6 3.5  CL 97   < > 100 100 99  CO2 31   < > 28 28 28   GLUCOSE 95   < > 92 93 96  BUN 27*   < > 21 25* 22  CREATININE 1.30*   < > 1.13 1.30* 1.22*  CALCIUM 9.5   < > 9.3 11.1* 10.3  PROT 7.0  --   --  7.3 6.7  ALBUMIN 4.4  --   --  4.3  --   AST 21  --   --  27  --   ALT 10  --   --  19  --   ALKPHOS 67  --   --  85  --   BILITOT 0.6  --   --  0.6  --   BILIDIR 0.1  --   --  0.1  --    < > = values in this interval not displayed.    RADIOGRAPHIC STUDIES: I have personally reviewed the radiological images as listed and agreed with the findings in the report. No results found.  ASSESSMENT & PLAN:   MGUS (monoclonal gammopathy of unknown significance) # MGUS-no quantifiable protein noted on protein electrophoresis however poorly-defined band of restricted protein mobility is detected in the gamma globulins. It is unlikely that this may represent a monoclonal protein; however, immunofixation analysis is indicated for further evaluation.  #Patient has chronic kidney disease; mild anemia [fairly steady; not getting any worse]-no evidence of ongoing hypercalcemia-some concern for any multiple myeloma-like disease is small.  I would recommend kappa lambda light chain ratio immunofixation in approximately 6 months.  # Mild Anemia sec to RA.  Hemoglobin around 11.  Stable.  # CKD- III-creatinine 1.2 -1.3.  STABLE.   # RA- on infliximab/mxt SQ [Dr.Kernodle]  # follow up in 6 months; labs-1 week prior [cbc/cmp/iron studies/ferritin; Multiple myeloma panel; K/l light chains]  # Thank you Dr. for allowing me to participate in  the care of your pleasant patient. Please do not hesitate to contact me with questions or concerns in the interim.   All questions were answered. The patient knows to call the clinic with any problems,  questions or concerns.    Cammie Sickle, MD 01/19/2018 1:40 PM

## 2018-01-26 DIAGNOSIS — Z23 Encounter for immunization: Secondary | ICD-10-CM | POA: Diagnosis not present

## 2018-01-27 ENCOUNTER — Telehealth: Payer: Self-pay | Admitting: Internal Medicine

## 2018-01-27 MED ORDER — TRIAMTERENE-HCTZ 37.5-25 MG PO TABS: 1.0000 | ORAL_TABLET | Freq: Every day | ORAL | 0 refills | Status: DC

## 2018-01-27 NOTE — Telephone Encounter (Signed)
Copied from [place] [id number]. Topic: Quick Communication - Rx Refill/Question >> Jan 27, 2018  9:42 AM Oliver Pila B wrote: Medication: triamterene-hydrochlorothiazide (BEEFEOF-12) 37.5-25 MG tablet [[id number]]   Has the patient contacted their pharmacy? Yes.   (Agent: If no, request that the patient contact the pharmacy for the refill.) (Agent: If yes, when and what did the pharmacy advise?)  Preferred Pharmacy (with phone number or street name): [facility]  Agent: Please be advised that RX refills may take up to 3 business days. We ask that you follow-up with your pharmacy.

## 2018-01-27 NOTE — Telephone Encounter (Signed)
Triamterene- hydrochlorothiazide 37.5-25 mg  refill Last Refill:10/27/17  # 90 Last OV: 12/02/17 PCP: [place]: [place]

## 2018-02-01 DIAGNOSIS — H4043X4 Glaucoma secondary to eye inflammation, bilateral, indeterminate stage: Secondary | ICD-10-CM | POA: Diagnosis not present

## 2018-02-04 ENCOUNTER — Ambulatory Visit
Admission: EM | Admit: 2018-02-04 | Discharge: 2018-02-04 | Disposition: A | Discharge status: Home / Self Care | Payer: Medicare Other | Attending: Family Medicine | Admitting: Family Medicine

## 2018-02-04 ENCOUNTER — Encounter: Payer: Self-pay | Admitting: Emergency Medicine

## 2018-02-04 ENCOUNTER — Other Ambulatory Visit: Payer: Self-pay

## 2018-02-04 DIAGNOSIS — N3000 Acute cystitis without hematuria: Secondary | ICD-10-CM

## 2018-02-04 DIAGNOSIS — R300 Dysuria: Secondary | ICD-10-CM | POA: Diagnosis present

## 2018-02-04 DIAGNOSIS — K219 Gastro-esophageal reflux disease without esophagitis: Secondary | ICD-10-CM | POA: Diagnosis not present

## 2018-02-04 DIAGNOSIS — M48061 Spinal stenosis, lumbar region without neurogenic claudication: Secondary | ICD-10-CM | POA: Diagnosis not present

## 2018-02-04 DIAGNOSIS — E7800 Pure hypercholesterolemia, unspecified: Secondary | ICD-10-CM | POA: Insufficient documentation

## 2018-02-04 DIAGNOSIS — D649 Anemia, unspecified: Secondary | ICD-10-CM | POA: Diagnosis not present

## 2018-02-04 DIAGNOSIS — Z79899 Other long term (current) drug therapy: Secondary | ICD-10-CM | POA: Diagnosis not present

## 2018-02-04 DIAGNOSIS — M064 Inflammatory polyarthropathy: Secondary | ICD-10-CM | POA: Diagnosis not present

## 2018-02-04 DIAGNOSIS — K5790 Diverticulosis of intestine, part unspecified, without perforation or abscess without bleeding: Secondary | ICD-10-CM | POA: Insufficient documentation

## 2018-02-04 DIAGNOSIS — Z87442 Personal history of urinary calculi: Secondary | ICD-10-CM | POA: Insufficient documentation

## 2018-02-04 DIAGNOSIS — K589 Irritable bowel syndrome without diarrhea: Secondary | ICD-10-CM | POA: Diagnosis not present

## 2018-02-04 DIAGNOSIS — Z7951 Long term (current) use of inhaled steroids: Secondary | ICD-10-CM | POA: Insufficient documentation

## 2018-02-04 DIAGNOSIS — M8580 Other specified disorders of bone density and structure, unspecified site: Secondary | ICD-10-CM | POA: Insufficient documentation

## 2018-02-04 DIAGNOSIS — Z87440 Personal history of urinary (tract) infections: Secondary | ICD-10-CM

## 2018-02-04 DIAGNOSIS — D472 Monoclonal gammopathy: Secondary | ICD-10-CM | POA: Insufficient documentation

## 2018-02-04 DIAGNOSIS — I10 Essential (primary) hypertension: Secondary | ICD-10-CM | POA: Insufficient documentation

## 2018-02-04 LAB — URINALYSIS, COMPLETE (UACMP) WITH MICROSCOPIC
BACTERIA UA: NONE SEEN
SQUAMOUS EPITHELIAL / LPF: NONE SEEN (ref 0–5)

## 2018-02-04 MED ORDER — CEPHALEXIN 500 MG PO CAPS: 500.0000 mg | ORAL_CAPSULE | Freq: Two times a day (BID) | ORAL | 0 refills | Status: DC

## 2018-02-04 NOTE — ED Provider Notes (Signed)
[facility]    CSN: [id number] Arrival date & time: 02/04/18  1011  History   Chief Complaint Chief Complaint  Patient presents with  . Dysuria   HPI  84 year old female presents with concerns for UTI.  Symptoms began abruptly this morning.  She has a history of UTI.  She reports dysuria, frequency, urgency.  Chills.  No fever.  She has taken Azo for the pain.  Moderate to severe.  No known exacerbating relieving factors.  No other associated symptoms.  No other complaints.   PMH, Surgical Hx, Family Hx, Social History reviewed and updated as below.  Past Medical History:  Diagnosis Date  . Diverticulosis   . Fibrocystic breast disease   . GERD (gastroesophageal reflux disease)   . History of kidney stones   . Hypercholesterolemia   . Hypertension   . Hypertension   . IBS (irritable bowel syndrome)   . Inflammatory arthritis    positive anti-CCP abs, s/p prednisone, MTX, Remicade  . Nephrolithiasis   . Osteopenia    GI upset with Fosamax  . Pancreatitis 2007   s/p ERCP  . Pancreatitis   . PONV (postoperative nausea and vomiting)   . Renal insufficiency   . Spinal stenosis of lumbosacral region    Patient Active Problem List   Diagnosis Date Noted  . MGUS (monoclonal gammopathy of unknown significance) 01/19/2018  . Cramps, extremity 12/02/2017  . Abdominal bruit 01/16/2017  . Lumbar stenosis 08/18/2016  . Abdominal pain 12/03/2015  . Abnormal liver function tests 10/18/2015  . Anemia 10/18/2015  . Hyponatremia 10/07/2015  . Lip lesion 04/23/2015  . Scalp lesion 04/23/2015  . Loss of weight 04/23/2015  . Stress 04/23/2015  . Left shoulder pain 12/20/2014  . UTI (urinary tract infection) 08/20/2014  . Health care maintenance 08/20/2014  . Skin lesion 04/17/2014  . Fatigue 04/13/2014  . Shoulder pain, right 12/17/2013  . Trigger finger 08/21/2013  . Left hip pain 04/17/2013  . GERD (gastroesophageal reflux disease) 04/03/2012  . Inflammatory  arthritis ([place]) 03/30/2012  . Diverticulosis 03/30/2012  . Osteopenia 03/30/2012  . Hypertension 03/30/2012  . Hypercholesterolemia 03/30/2012    Past Surgical History:  Procedure Laterality Date  . ABDOMINAL HYSTERECTOMY  1993   ovaries not removed  . BREAST BIOPSY Left 1984   negative  . BREAST CYST EXCISION Left 2010   negative  . LITHOTRIPSY    . LUMBAR LAMINECTOMY  1983  . LUMBAR LAMINECTOMY/DECOMPRESSION MICRODISCECTOMY N/A 08/18/2016   Procedure: LUMBAR LAMINECTOMY/DECOMPRESSION MICRODISCECTOMY 1 LEVEL;  Surgeon: Bayard Hugger, MD;  Location: [facility] ORS;  Service: Neurosurgery;  Laterality: N/A;  L4-5 Laminectomy, MIS, L5 foraminotomy  . TRIGGER FINGER RELEASE     right ring finger    OB History   None      Home Medications    Prior to Admission medications   Medication Sig Start Date End Date Taking? Authorizing Provider  acyclovir (ZOVIRAX) 400 MG tablet As directed 01/14/17  Yes Einar Pheasant, MD  Cholecalciferol (VITAMIN D) 2000 UNITS tablet Take 2,000 Units by mouth daily.   Yes [provider]  diltiazem (DILACOR XR) 240 MG 24 hr capsule Take 1 capsule (240 mg total) by mouth daily. 09/14/17  Yes Einar Pheasant, MD  dorzolamide-timolol (COSOPT) 22.3-6.8 MG/ML ophthalmic solution INSTILL 1 DROP INTO EACH EYE TWICE DAILY 12/17/17  Yes [provider]  fluticasone (FLONASE) 50 MCG/ACT nasal spray USE TWO SPRAY(S) IN EACH NOSTRIL ONCE DAILY 01/14/17  Yes Einar Pheasant, MD  folic acid (FOLVITE) 1 MG tablet Take 1 mg by mouth 2 (two) times daily. 12/14/17  Yes [provider]  gabapentin (NEURONTIN) 300 MG capsule Take 1 capsule (300 mg total) by mouth at bedtime. 01/11/18  Yes Einar Pheasant, MD  InFLIXimab (REMICADE IV) Inject into the vein every 6 (six) weeks. Per Dr Jefm Bryant   Yes [provider]  latanoprost (XALATAN) 0.005 % ophthalmic solution  01/18/18  Yes [provider]  losartan (COZAAR) 100 MG tablet TAKE 1 TABLET  DAILY 11/24/17  Yes Einar Pheasant, MD  lovastatin (MEVACOR) 40 MG tablet Take 1 tablet (40 mg total) by mouth daily. 08/12/17  Yes Einar Pheasant, MD  methotrexate 25 MG/ML SOLN Inject 17.5 mg into the skin once a week. Patient takes 17.5 mg (0.7 ml) on Monday or Tuesday of each week.   Yes [provider]  omeprazole (PRILOSEC) 20 MG capsule Take 1 capsule (20 mg total) by mouth daily. 09/14/17  Yes Einar Pheasant, MD  ondansetron (ZOFRAN) 4 MG tablet Take 1 tablet (4 mg total) by mouth every 6 (six) hours as needed for nausea or vomiting. 08/19/16  Yes Bayard Hugger, MD  polyethylene glycol (MIRALAX / GLYCOLAX) packet Take 17 g by mouth daily.   Yes [provider]  psyllium (METAMUCIL) 58.6 % packet Take 1 packet by mouth daily.   Yes [provider]  triamterene-hydrochlorothiazide (MAXZIDE-25) 37.5-25 MG tablet Take 1 tablet by mouth daily. 01/27/18  Yes Einar Pheasant, MD  cephALEXin (KEFLEX) 500 MG capsule Take 1 capsule (500 mg total) by mouth 2 (two) times daily. 02/04/18   Coral Spikes, DO    Family History Family History  Problem Relation Age of Onset  . Ovarian cancer Mother   . Renal Disease Mother   . Heart disease Mother   . Diabetes Mother   . Hypertension Mother   . Breast cancer Neg Hx     Social History Social History   Tobacco Use  . Smoking status: Never Smoker  . Smokeless tobacco: Never Used  Substance Use Topics  . Alcohol use: No    Alcohol/week: 0.0 standard drinks  . Drug use: No     Allergies   Carisoprodol; Codeine; Flagyl [metronidazole]; Nsaids; Skelaxin [metaxalone]; Tenormin [atenolol]; and Tramadol   Review of Systems Review of Systems  Constitutional: Positive for chills. Negative for fever.  Gastrointestinal: Negative.   Genitourinary: Positive for dysuria, frequency and urgency.   Physical Exam Triage Vital Signs ED Triage Vitals  Enc Vitals Group     BP 02/04/18 1025 (!) 150/56     Pulse Rate 02/04/18  1025 (!) 57     Resp 02/04/18 1025 17     Temp 02/04/18 1025 98.1 F (36.7 C)     Temp Source 02/04/18 1025 Oral     SpO2 02/04/18 1025 94 %     Weight 02/04/18 1024 119 lb (54 kg)     Height 02/04/18 1024 5\' 1"  (1.549 m)     Head Circumference --      Peak Flow --      Pain Score 02/04/18 1023 4     Pain Loc --      Pain Edu? --      Excl. in [place]? --    Updated Vital Signs BP (!) 150/56 (BP Location: Left Arm)   Pulse (!) 57   Temp 98.1 F (36.7 C) (Oral)   Resp 17   Ht 5\' 1"  (1.549 m)  Wt 54 kg   SpO2 94%   BMI 22.48 kg/m   Visual Acuity Right Eye Distance:   Left Eye Distance:   Bilateral Distance:    Right Eye Near:   Left Eye Near:    Bilateral Near:     Physical Exam  Constitutional: She is oriented to person, place, and time. She appears well-developed. No distress.  Cardiovascular: Normal rate and regular rhythm.  Pulmonary/Chest: Effort normal. No respiratory distress.  Abdominal: Soft. She exhibits no distension. There is no tenderness.  Neurological: She is alert and oriented to person, place, and time.  Psychiatric: She has a normal mood and affect. Her behavior is normal.  Nursing note and vitals reviewed.  UC Treatments / Results  Labs (all labs ordered are listed, but only abnormal results are displayed) Labs Reviewed  URINALYSIS, COMPLETE (UACMP) WITH MICROSCOPIC - Abnormal; Notable for the following components:      Result Value   Color, Urine ORANGE (*)    Glucose, UA   (*)    Value: TEST NOT REPORTED DUE TO COLOR INTERFERENCE OF URINE PIGMENT   Hgb urine dipstick   (*)    Value: TEST NOT REPORTED DUE TO COLOR INTERFERENCE OF URINE PIGMENT   Bilirubin Urine   (*)    Value: TEST NOT REPORTED DUE TO COLOR INTERFERENCE OF URINE PIGMENT   Ketones, ur   (*)    Value: TEST NOT REPORTED DUE TO COLOR INTERFERENCE OF URINE PIGMENT   Protein, ur   (*)    Value: TEST NOT REPORTED DUE TO COLOR INTERFERENCE OF URINE PIGMENT   Nitrite   (*)    Value:  TEST NOT REPORTED DUE TO COLOR INTERFERENCE OF URINE PIGMENT   Leukocytes, UA   (*)    Value: TEST NOT REPORTED DUE TO COLOR INTERFERENCE OF URINE PIGMENT   All other components within normal limits  URINE CULTURE    EKG None  Radiology No results found.  Procedures Procedures (including critical care time)  Medications Ordered in UC Medications - No data to display  Initial Impression / Assessment and Plan / UC Course  I have reviewed the triage vital signs and the nursing notes.  Pertinent labs & imaging results that were available during my care of the patient were reviewed by me and considered in my medical decision making (see chart for details).    84 year old female presents with UTI.  Treating with Keflex.  Sending culture.  Final Clinical Impressions(s) / UC Diagnoses   Final diagnoses:  Acute cystitis without hematuria   Discharge Instructions   None    ED Prescriptions    Medication Sig Dispense Auth. Provider   cephALEXin (KEFLEX) 500 MG capsule Take 1 capsule (500 mg total) by mouth 2 (two) times daily. 14 capsule Coral Spikes, DO     Controlled Substance Prescriptions [place] Controlled Substance Registry consulted? Not Applicable   Coral Spikes, DO 02/04/18 1159

## 2018-02-04 NOTE — ED Triage Notes (Signed)
Pt c/o urinary frequency, retention, chills, and dysuria. Started this morning.

## 2018-02-06 LAB — URINE CULTURE

## 2018-02-22 ENCOUNTER — Ambulatory Visit: Payer: Medicare Other | Admitting: Internal Medicine

## 2018-02-24 DIAGNOSIS — H4043X4 Glaucoma secondary to eye inflammation, bilateral, indeterminate stage: Secondary | ICD-10-CM | POA: Diagnosis not present

## 2018-03-01 DIAGNOSIS — Z79899 Other long term (current) drug therapy: Secondary | ICD-10-CM | POA: Diagnosis not present

## 2018-03-01 DIAGNOSIS — M0609 Rheumatoid arthritis without rheumatoid factor, multiple sites: Secondary | ICD-10-CM | POA: Diagnosis not present

## 2018-03-01 DIAGNOSIS — M0579 Rheumatoid arthritis with rheumatoid factor of multiple sites without organ or systems involvement: Secondary | ICD-10-CM | POA: Diagnosis not present

## 2018-03-04 ENCOUNTER — Other Ambulatory Visit: Payer: Self-pay | Admitting: Internal Medicine

## 2018-03-04 DIAGNOSIS — K219 Gastro-esophageal reflux disease without esophagitis: Secondary | ICD-10-CM

## 2018-03-05 ENCOUNTER — Ambulatory Visit (INDEPENDENT_AMBULATORY_CARE_PROVIDER_SITE_OTHER): Payer: Medicare Other | Admitting: Internal Medicine

## 2018-03-05 ENCOUNTER — Encounter: Payer: Self-pay | Admitting: Internal Medicine

## 2018-03-05 DIAGNOSIS — E7800 Pure hypercholesterolemia, unspecified: Secondary | ICD-10-CM

## 2018-03-05 DIAGNOSIS — I10 Essential (primary) hypertension: Secondary | ICD-10-CM | POA: Diagnosis not present

## 2018-03-05 DIAGNOSIS — F439 Reaction to severe stress, unspecified: Secondary | ICD-10-CM

## 2018-03-05 DIAGNOSIS — M1990 Unspecified osteoarthritis, unspecified site: Secondary | ICD-10-CM

## 2018-03-05 DIAGNOSIS — D472 Monoclonal gammopathy: Secondary | ICD-10-CM | POA: Diagnosis not present

## 2018-03-05 DIAGNOSIS — K219 Gastro-esophageal reflux disease without esophagitis: Secondary | ICD-10-CM

## 2018-03-05 DIAGNOSIS — R634 Abnormal weight loss: Secondary | ICD-10-CM | POA: Diagnosis not present

## 2018-03-05 DIAGNOSIS — D649 Anemia, unspecified: Secondary | ICD-10-CM

## 2018-03-05 DIAGNOSIS — E785 Hyperlipidemia, unspecified: Secondary | ICD-10-CM | POA: Diagnosis not present

## 2018-03-05 MED ORDER — LOVASTATIN 40 MG PO TABS: 40.0000 mg | ORAL_TABLET | Freq: Every day | ORAL | 1 refills | Status: DC

## 2018-03-05 NOTE — Progress Notes (Signed)
Patient ID: Stacey Snyder, female   DOB: [birth year], 84 y.o.   MRN: [id number]   Subjective:    Patient ID: Stacey Snyder, female    DOB: 1932/10/27, 84 y.o.   MRN: [id number]  HPI  Patient here for a scheduled follow up.  Increased stress with her husband's health issues.  Discussed with her today.  She does not feel she needs any further intervention.  She has lost some weight.  States she is eating.  No nausea or vomiting.  No abdominal pain.  Bowels moving.  No flares with her GI tract recently.  Previously had uti.  Treated.  Doing well now.  Taking remicade.  Seeing ophthalmology.  On prednisone eye drops.  Joints stable.     Past Medical History:  Diagnosis Date  . Diverticulosis   . Fibrocystic breast disease   . GERD (gastroesophageal reflux disease)   . History of kidney stones   . Hypercholesterolemia   . Hypertension   . Hypertension   . IBS (irritable bowel syndrome)   . Inflammatory arthritis    positive anti-CCP abs, s/p prednisone, MTX, Remicade  . Nephrolithiasis   . Osteopenia    GI upset with Fosamax  . Pancreatitis 2007   s/p ERCP  . Pancreatitis   . PONV (postoperative nausea and vomiting)   . Renal insufficiency   . Spinal stenosis of lumbosacral region    Past Surgical History:  Procedure Laterality Date  . ABDOMINAL HYSTERECTOMY  1993   ovaries not removed  . BREAST BIOPSY Left 1984   negative  . BREAST CYST EXCISION Left 2010   negative  . LITHOTRIPSY    . LUMBAR LAMINECTOMY  1983  . LUMBAR LAMINECTOMY/DECOMPRESSION MICRODISCECTOMY N/A 08/18/2016   Procedure: LUMBAR LAMINECTOMY/DECOMPRESSION MICRODISCECTOMY 1 LEVEL;  Surgeon: Bayard Hugger, MD;  Location: [facility] ORS;  Service: Neurosurgery;  Laterality: N/A;  L4-5 Laminectomy, MIS, L5 foraminotomy  . TRIGGER FINGER RELEASE     right ring finger   Family History  Problem Relation Age of Onset  . Ovarian cancer Mother   . Renal Disease Mother   . Heart disease Mother   . Diabetes  Mother   . Hypertension Mother   . Breast cancer Neg Hx    Social History   Socioeconomic History  . Marital status: Married    Spouse name: Not on file  . Number of children: 4  . Years of education: Not on file  . Highest education level: Not on file  Occupational History  . Not on file  Social Needs  . Financial resource strain: Not on file  . Food insecurity:    Worry: Not on file    Inability: Not on file  . Transportation needs:    Medical: Not on file    Non-medical: Not on file  Tobacco Use  . Smoking status: Never Smoker  . Smokeless tobacco: Never Used  Substance and Sexual Activity  . Alcohol use: No    Alcohol/week: 0.0 standard drinks  . Drug use: No  . Sexual activity: Not on file  Lifestyle  . Physical activity:    Days per week: Not on file    Minutes per session: Not on file  . Stress: Not on file  Relationships  . Social connections:    Talks on phone: Not on file    Gets together: Not on file    Attends religious service: Not on file    Active member of club or organization: Not  on file    Attends meetings of clubs or organizations: Not on file    Relationship status: Not on file  Other Topics Concern  . Not on file  Social History Narrative  . Not on file    Outpatient Encounter Medications as of 03/05/2018  Medication Sig  . acyclovir (ZOVIRAX) 400 MG tablet As directed  . Cholecalciferol (VITAMIN D) 2000 UNITS tablet Take 2,000 Units by mouth daily.  Marland Kitchen diltiazem (DILACOR XR) 240 MG 24 hr capsule Take 1 capsule (240 mg total) by mouth daily.  . dorzolamide-timolol (COSOPT) 22.3-6.8 MG/ML ophthalmic solution INSTILL 1 DROP INTO EACH EYE TWICE DAILY  . fluticasone (FLONASE) 50 MCG/ACT nasal spray USE TWO SPRAY(S) IN EACH NOSTRIL ONCE DAILY  . folic acid (FOLVITE) 1 MG tablet Take 1 mg by mouth 2 (two) times daily.  Marland Kitchen gabapentin (NEURONTIN) 300 MG capsule Take 1 capsule (300 mg total) by mouth at bedtime.  . InFLIXimab (REMICADE IV) Inject  into the vein every 6 (six) weeks. Per Dr Jefm Bryant  . latanoprost (XALATAN) 0.005 % ophthalmic solution   . losartan (COZAAR) 100 MG tablet TAKE 1 TABLET DAILY  . lovastatin (MEVACOR) 40 MG tablet Take 1 tablet (40 mg total) by mouth daily.  . methotrexate 25 MG/ML SOLN Inject 17.5 mg into the skin once a week. Patient takes 17.5 mg (0.7 ml) on Monday or Tuesday of each week.  Marland Kitchen omeprazole (PRILOSEC) 20 MG capsule TAKE 1 CAPSULE DAILY  . ondansetron (ZOFRAN) 4 MG tablet Take 1 tablet (4 mg total) by mouth every 6 (six) hours as needed for nausea or vomiting.  . polyethylene glycol (MIRALAX / GLYCOLAX) packet Take 17 g by mouth daily.  . psyllium (METAMUCIL) 58.6 % packet Take 1 packet by mouth daily.  Marland Kitchen triamterene-hydrochlorothiazide (MAXZIDE-25) 37.5-25 MG tablet Take 1 tablet by mouth daily.  . [DISCONTINUED] cephALEXin (KEFLEX) 500 MG capsule Take 1 capsule (500 mg total) by mouth 2 (two) times daily.  . [DISCONTINUED] lovastatin (MEVACOR) 40 MG tablet Take 1 tablet (40 mg total) by mouth daily.   No facility-administered encounter medications on file as of 03/05/2018.     Review of Systems  Constitutional: Negative for appetite change.       Weight loss as outlined.    HENT: Negative for congestion and sinus pressure.   Respiratory: Negative for cough, chest tightness and shortness of breath.   Cardiovascular: Negative for chest pain, palpitations and leg swelling.  Gastrointestinal: Negative for abdominal pain, diarrhea, nausea and vomiting.  Genitourinary: Negative for difficulty urinating and dysuria.  Musculoskeletal: Negative for joint swelling and myalgias.  Skin: Negative for color change and rash.  Neurological: Negative for dizziness, light-headedness and headaches.  Psychiatric/Behavioral: Negative for agitation.       Increased stress as outlined.         Objective:    Physical Exam  Constitutional: She appears well-developed and well-nourished. No distress.  HENT:    Nose: Nose normal.  Mouth/Throat: Oropharynx is clear and moist.  Neck: Neck supple. No thyromegaly present.  Cardiovascular: Normal rate and regular rhythm.  Pulmonary/Chest: Breath sounds normal. No respiratory distress. She has no wheezes.  Abdominal: Soft. Bowel sounds are normal. There is no tenderness.  Musculoskeletal: She exhibits no edema or tenderness.  Lymphadenopathy:    She has no cervical adenopathy.  Skin: No rash noted. No erythema.  Psychiatric: She has a normal mood and affect. Her behavior is normal.    BP 128/62 (BP Location: Left Arm,  Patient Position: Sitting, Cuff Size: Normal)   Pulse 64   Temp 97.6 F (36.4 C) (Oral)   Resp 18   Wt 116 lb 6.4 oz (52.8 kg)   SpO2 97%   BMI 21.99 kg/m  Wt Readings from Last 3 Encounters:  03/05/18 116 lb 6.4 oz (52.8 kg)  02/04/18 119 lb (54 kg)  01/19/18 119 lb 0.8 oz (54 kg)     Lab Results  Component Value Date   WBC 8.3 12/02/2017   HGB 11.7 (L) 12/02/2017   HCT 34.9 (L) 12/02/2017   PLT 358.0 12/02/2017   GLUCOSE 96 12/08/2017   CHOL 180 04/21/2017   TRIG 96.0 04/21/2017   HDL 74.80 04/21/2017   LDLCALC 86 04/21/2017   ALT 19 12/02/2017   AST 27 12/02/2017   NA 136 12/08/2017   K 3.5 12/08/2017   CL 99 12/08/2017   CREATININE 1.22 (H) 12/08/2017   BUN 22 12/08/2017   CO2 28 12/08/2017   TSH 1.51 12/02/2017   INR 0.92 08/07/2016       Assessment & Plan:   Problem List Items Addressed This Visit    Anemia    Follow cbc.       GERD (gastroesophageal reflux disease)    Controlled on omeprazole.        Hypercholesterolemia    On lovastin.  Follow lipid panel and liver function tests.        Relevant Medications   lovastatin (MEVACOR) 40 MG tablet   Hypertension    Blood pressure under good control.  Continue same medication regimen.  Follow pressures.  Follow metabolic panel.        Relevant Medications   lovastatin (MEVACOR) 40 MG tablet   Inflammatory arthritis ([place])    Followed  by Dr Jefm Bryant.  Receiving remicade.  Doing well.  Joints stable.        Loss of weight    Persistent.  Discussed with her today.  States she is eating.  No nausea or vomiting.  No abdominal pain.  Discussed further w/up.  She declines.  Follow.        MGUS (monoclonal gammopathy of unknown significance)    Seeing hematology.  Recommended to follow at this point.        Stress    Increased stress as outlined.  Discussed with her today.  She does not feel needs any further intervention.  Follow.         Other Visit Diagnoses    Hyperlipidemia, unspecified hyperlipidemia type       Relevant Medications   lovastatin (MEVACOR) 40 MG tablet       Einar Pheasant, MD

## 2018-03-07 ENCOUNTER — Encounter: Payer: Self-pay | Admitting: Internal Medicine

## 2018-03-07 NOTE — Assessment & Plan Note (Signed)
Increased stress as outlined.  Discussed with her today.  She does not feel needs any further intervention.  Follow.   

## 2018-03-07 NOTE — Assessment & Plan Note (Signed)
Controlled on omeprazole.   

## 2018-03-07 NOTE — Assessment & Plan Note (Signed)
Followed by Dr Jefm Bryant.  Receiving remicade.  Doing well.  Joints stable.

## 2018-03-07 NOTE — Assessment & Plan Note (Signed)
Blood pressure under good control.  Continue same medication regimen.  Follow pressures.  Follow metabolic panel.   

## 2018-03-07 NOTE — Assessment & Plan Note (Signed)
Persistent.  Discussed with her today.  States she is eating.  No nausea or vomiting.  No abdominal pain.  Discussed further w/up.  She declines.  Follow.

## 2018-03-07 NOTE — Assessment & Plan Note (Signed)
Follow cbc.  

## 2018-03-07 NOTE — Assessment & Plan Note (Signed)
Seeing hematology.  Recommended to follow at this point.

## 2018-03-07 NOTE — Assessment & Plan Note (Signed)
On lovastin.  Follow lipid panel and liver function tests.

## 2018-03-08 ENCOUNTER — Other Ambulatory Visit: Payer: Self-pay | Admitting: Internal Medicine

## 2018-03-08 DIAGNOSIS — E785 Hyperlipidemia, unspecified: Secondary | ICD-10-CM

## 2018-03-08 DIAGNOSIS — E1159 Type 2 diabetes mellitus with other circulatory complications: Secondary | ICD-10-CM

## 2018-03-08 DIAGNOSIS — I10 Essential (primary) hypertension: Secondary | ICD-10-CM

## 2018-03-08 MED ORDER — DILTIAZEM HCL ER 240 MG PO CP24: 240.0000 mg | ORAL_CAPSULE | Freq: Every day | ORAL | 0 refills | Status: DC

## 2018-03-08 NOTE — Telephone Encounter (Signed)
Copied from [place] [id number]. Topic: Quick Communication - Rx Refill/Question >> Mar 08, 2018  1:15 PM Waldemar Dickens, Sade R wrote: Medication: lovastatin (MEVACOR) 40 MG tablet , diltiazem (DILACOR XR) 240 MG 24 hr capsule  Has the patient contacted their pharmacy? Yes  Preferred Pharmacy (with phone number or street name): [facility], [place] [facility] [phone] (Phone) [phone] (Fax)    Agent: Please be advised that RX refills may take up to 3 business days. We ask that you follow-up with your pharmacy.

## 2018-04-02 ENCOUNTER — Ambulatory Visit
Admission: EM | Admit: 2018-04-02 | Discharge: 2018-04-02 | Disposition: A | Discharge status: Home / Self Care | Payer: Medicare Other | Attending: Family Medicine | Admitting: Family Medicine

## 2018-04-02 ENCOUNTER — Encounter: Payer: Self-pay | Admitting: Emergency Medicine

## 2018-04-02 ENCOUNTER — Other Ambulatory Visit: Payer: Self-pay

## 2018-04-02 DIAGNOSIS — D649 Anemia, unspecified: Secondary | ICD-10-CM | POA: Insufficient documentation

## 2018-04-02 DIAGNOSIS — Z8249 Family history of ischemic heart disease and other diseases of the circulatory system: Secondary | ICD-10-CM | POA: Diagnosis not present

## 2018-04-02 DIAGNOSIS — E7800 Pure hypercholesterolemia, unspecified: Secondary | ICD-10-CM | POA: Diagnosis not present

## 2018-04-02 DIAGNOSIS — D472 Monoclonal gammopathy: Secondary | ICD-10-CM | POA: Diagnosis not present

## 2018-04-02 DIAGNOSIS — I10 Essential (primary) hypertension: Secondary | ICD-10-CM | POA: Insufficient documentation

## 2018-04-02 DIAGNOSIS — Z881 Allergy status to other antibiotic agents status: Secondary | ICD-10-CM | POA: Diagnosis not present

## 2018-04-02 DIAGNOSIS — M8580 Other specified disorders of bone density and structure, unspecified site: Secondary | ICD-10-CM | POA: Diagnosis not present

## 2018-04-02 DIAGNOSIS — M064 Inflammatory polyarthropathy: Secondary | ICD-10-CM | POA: Diagnosis not present

## 2018-04-02 DIAGNOSIS — Z885 Allergy status to narcotic agent status: Secondary | ICD-10-CM | POA: Insufficient documentation

## 2018-04-02 DIAGNOSIS — K219 Gastro-esophageal reflux disease without esophagitis: Secondary | ICD-10-CM | POA: Insufficient documentation

## 2018-04-02 DIAGNOSIS — Z886 Allergy status to analgesic agent status: Secondary | ICD-10-CM | POA: Diagnosis not present

## 2018-04-02 DIAGNOSIS — Z888 Allergy status to other drugs, medicaments and biological substances status: Secondary | ICD-10-CM | POA: Diagnosis not present

## 2018-04-02 DIAGNOSIS — N3000 Acute cystitis without hematuria: Secondary | ICD-10-CM | POA: Diagnosis not present

## 2018-04-02 DIAGNOSIS — M48061 Spinal stenosis, lumbar region without neurogenic claudication: Secondary | ICD-10-CM | POA: Diagnosis not present

## 2018-04-02 DIAGNOSIS — Z79899 Other long term (current) drug therapy: Secondary | ICD-10-CM | POA: Insufficient documentation

## 2018-04-02 DIAGNOSIS — R300 Dysuria: Secondary | ICD-10-CM | POA: Diagnosis present

## 2018-04-02 LAB — URINALYSIS, COMPLETE (UACMP) WITH MICROSCOPIC
Bilirubin Urine: NEGATIVE
Glucose, UA: NEGATIVE mg/dL
Ketones, ur: NEGATIVE mg/dL
Protein, ur: NEGATIVE mg/dL
SPECIFIC GRAVITY, URINE: 1.015 (ref 1.005–1.030)
pH: 7 (ref 5.0–8.0)

## 2018-04-02 MED ORDER — CEPHALEXIN 500 MG PO CAPS: 500.0000 mg | ORAL_CAPSULE | Freq: Two times a day (BID) | ORAL | 0 refills | Status: DC

## 2018-04-02 NOTE — Discharge Instructions (Signed)
Antibiotic as prescribed.  Take care  Dr. Sherree Shankman  

## 2018-04-02 NOTE — ED Triage Notes (Signed)
Patient c/o burning when urinating and urinary urgency that started yesterday.   

## 2018-04-02 NOTE — ED Provider Notes (Signed)
[facility]    CSN: [id number] Arrival date & time: 04/02/18  4128  History   Chief Complaint Chief Complaint  Patient presents with  . Dysuria   HPI   84 year old female presents with urinary symptoms.  Symptoms started yesterday.  Patient reports dysuria, frequency, urgency.  No fever.  Has a history of UTIs.  No abdominal pain.  No known exacerbating relieving factors.  No other reported symptoms.  No other complaints.  PMH, Surgical Hx, Family Hx, Social History reviewed and updated as below.  Past Medical History:  Diagnosis Date  . Diverticulosis   . Fibrocystic breast disease   . GERD (gastroesophageal reflux disease)   . History of kidney stones   . Hypercholesterolemia   . Hypertension   . Hypertension   . IBS (irritable bowel syndrome)   . Inflammatory arthritis    positive anti-CCP abs, s/p prednisone, MTX, Remicade  . Nephrolithiasis   . Osteopenia    GI upset with Fosamax  . Pancreatitis 2007   s/p ERCP  . Pancreatitis   . PONV (postoperative nausea and vomiting)   . Renal insufficiency   . Spinal stenosis of lumbosacral region     Patient Active Problem List   Diagnosis Date Noted  . MGUS (monoclonal gammopathy of unknown significance) 01/19/2018  . Cramps, extremity 12/02/2017  . Abdominal bruit 01/16/2017  . Lumbar stenosis 08/18/2016  . Abdominal pain 12/03/2015  . Abnormal liver function tests 10/18/2015  . Anemia 10/18/2015  . Hyponatremia 10/07/2015  . Lip lesion 04/23/2015  . Scalp lesion 04/23/2015  . Loss of weight 04/23/2015  . Stress 04/23/2015  . Left shoulder pain 12/20/2014  . UTI (urinary tract infection) 08/20/2014  . Health care maintenance 08/20/2014  . Skin lesion 04/17/2014  . Fatigue 04/13/2014  . Shoulder pain, right 12/17/2013  . Trigger finger 08/21/2013  . Left hip pain 04/17/2013  . GERD (gastroesophageal reflux disease) 04/03/2012  . Inflammatory arthritis ([place]) 03/30/2012  . Diverticulosis  03/30/2012  . Osteopenia 03/30/2012  . Hypertension 03/30/2012  . Hypercholesterolemia 03/30/2012    Past Surgical History:  Procedure Laterality Date  . ABDOMINAL HYSTERECTOMY  1993   ovaries not removed  . BREAST BIOPSY Left 1984   negative  . BREAST CYST EXCISION Left 2010   negative  . LITHOTRIPSY    . LUMBAR LAMINECTOMY  1983  . LUMBAR LAMINECTOMY/DECOMPRESSION MICRODISCECTOMY N/A 08/18/2016   Procedure: LUMBAR LAMINECTOMY/DECOMPRESSION MICRODISCECTOMY 1 LEVEL;  Surgeon: Bayard Hugger, MD;  Location: [facility] ORS;  Service: Neurosurgery;  Laterality: N/A;  L4-5 Laminectomy, MIS, L5 foraminotomy  . TRIGGER FINGER RELEASE     right ring finger    OB History   None      Home Medications    Prior to Admission medications   Medication Sig Start Date End Date Taking? Authorizing Provider  Cholecalciferol (VITAMIN D) 2000 UNITS tablet Take 2,000 Units by mouth daily.   Yes [provider]  diltiazem (DILACOR XR) 240 MG 24 hr capsule Take 1 capsule (240 mg total) by mouth daily. 03/08/18  Yes Einar Pheasant, MD  dorzolamide-timolol (COSOPT) 22.3-6.8 MG/ML ophthalmic solution INSTILL 1 DROP INTO EACH EYE TWICE DAILY 12/17/17  Yes [provider]  fluticasone (FLONASE) 50 MCG/ACT nasal spray USE TWO SPRAY(S) IN EACH NOSTRIL ONCE DAILY 01/14/17  Yes Einar Pheasant, MD  folic acid (FOLVITE) 1 MG tablet Take 1 mg by mouth 2 (two) times daily. 12/14/17  Yes [provider]  gabapentin (NEURONTIN) 300 MG capsule  Take 1 capsule (300 mg total) by mouth at bedtime. 01/11/18  Yes Einar Pheasant, MD  InFLIXimab (REMICADE IV) Inject into the vein every 6 (six) weeks. Per Dr Jefm Bryant   Yes [provider]  losartan (COZAAR) 100 MG tablet TAKE 1 TABLET DAILY 11/24/17  Yes Einar Pheasant, MD  lovastatin (MEVACOR) 40 MG tablet Take 1 tablet (40 mg total) by mouth daily. 03/05/18  Yes Einar Pheasant, MD  methotrexate 25 MG/ML SOLN Inject 17.5 mg into the skin once a  week. Patient takes 17.5 mg (0.7 ml) on Monday or Tuesday of each week.   Yes [provider]  omeprazole (PRILOSEC) 20 MG capsule TAKE 1 CAPSULE DAILY 03/04/18  Yes Einar Pheasant, MD  ondansetron (ZOFRAN) 4 MG tablet Take 1 tablet (4 mg total) by mouth every 6 (six) hours as needed for nausea or vomiting. 08/19/16  Yes Bayard Hugger, MD  polyethylene glycol (MIRALAX / GLYCOLAX) packet Take 17 g by mouth daily.   Yes [provider]  psyllium (METAMUCIL) 58.6 % packet Take 1 packet by mouth daily.   Yes [provider]  triamterene-hydrochlorothiazide (MAXZIDE-25) 37.5-25 MG tablet Take 1 tablet by mouth daily. 01/27/18  Yes Einar Pheasant, MD  acyclovir (ZOVIRAX) 400 MG tablet As directed 01/14/17   Einar Pheasant, MD  cephALEXin (KEFLEX) 500 MG capsule Take 1 capsule (500 mg total) by mouth 2 (two) times daily. 04/02/18   Coral Spikes, DO  latanoprost (XALATAN) 0.005 % ophthalmic solution  01/18/18   [provider]    Family History Family History  Problem Relation Age of Onset  . Ovarian cancer Mother   . Renal Disease Mother   . Heart disease Mother   . Diabetes Mother   . Hypertension Mother   . Breast cancer Neg Hx     Social History Social History   Tobacco Use  . Smoking status: Never Smoker  . Smokeless tobacco: Never Used  Substance Use Topics  . Alcohol use: No    Alcohol/week: 0.0 standard drinks  . Drug use: No     Allergies   Carisoprodol; Codeine; Flagyl [metronidazole]; Nsaids; Skelaxin [metaxalone]; Tenormin [atenolol]; and Tramadol   Review of Systems Review of Systems  Constitutional: Negative for fever.  Gastrointestinal: Negative.   Genitourinary: Positive for dysuria, frequency and urgency.   Physical Exam Triage Vital Signs ED Triage Vitals  Enc Vitals Group     BP 04/02/18 0944 (!) 159/65     Pulse Rate 04/02/18 0944 60     Resp 04/02/18 0944 14     Temp 04/02/18 0944 97.9 F (36.6 C)     Temp Source  04/02/18 0944 Oral     SpO2 04/02/18 0944 100 %     Weight 04/02/18 0941 118 lb (53.5 kg)     Height 04/02/18 0941 5\' 1"  (1.549 m)     Head Circumference --      Peak Flow --      Pain Score 04/02/18 0940 6     Pain Loc --      Pain Edu? --      Excl. in [place]? --    Updated Vital Signs BP (!) 159/65 (BP Location: Left Arm)   Pulse 60   Temp 97.9 F (36.6 C) (Oral)   Resp 14   Ht 5\' 1"  (1.549 m)   Wt 53.5 kg   SpO2 100%   BMI 22.30 kg/m   Visual Acuity Right Eye Distance:   Left Eye  Distance:   Bilateral Distance:    Right Eye Near:   Left Eye Near:    Bilateral Near:     Physical Exam  Constitutional: She is oriented to person, place, and time. She appears well-developed. No distress.  HENT:  Head: Normocephalic and atraumatic.  Cardiovascular: Normal rate and regular rhythm.  Pulmonary/Chest: Effort normal and breath sounds normal. She has no wheezes. She has no rales.  Abdominal: Soft. She exhibits no distension. There is no tenderness.  Neurological: She is alert and oriented to person, place, and time.  Nursing note and vitals reviewed.  UC Treatments / Results  Labs (all labs ordered are listed, but only abnormal results are displayed) Labs Reviewed  URINALYSIS, COMPLETE (UACMP) WITH MICROSCOPIC - Abnormal; Notable for the following components:      Result Value   Color, Urine AMBER (*)    APPearance HAZY (*)    Hgb urine dipstick TRACE (*)    Nitrite   (*)    Value: TEST NOT REPORTED DUE TO COLOR INTERFERENCE OF URINE PIGMENT   Leukocytes, UA SMALL (*)    Bacteria, UA RARE (*)    All other components within normal limits  URINE CULTURE    EKG None  Radiology No results found.  Procedures Procedures (including critical care time)  Medications Ordered in UC Medications - No data to display  Initial Impression / Assessment and Plan / UC Course  I have reviewed the triage vital signs and the nursing notes.  Pertinent labs & imaging results  that were available during my care of the patient were reviewed by me and considered in my medical decision making (see chart for details).    84 year old female presents with urinary symptoms.  Sending culture.  Based on symptoms and urinalysis I am placing her on empiric Keflex while awaiting culture.  Her creatinine clearance is 30.  Keflex 500 twice daily.  Final Clinical Impressions(s) / UC Diagnoses   Final diagnoses:  Acute cystitis without hematuria     Discharge Instructions     Antibiotic as prescribed.  Take care  Dr. Lacinda Axon     ED Prescriptions    Medication Sig Dispense Auth. Provider   cephALEXin (KEFLEX) 500 MG capsule Take 1 capsule (500 mg total) by mouth 2 (two) times daily. 10 capsule Coral Spikes, DO     Controlled Substance Prescriptions [place] Controlled Substance Registry consulted? Not Applicable   Coral Spikes, DO 04/02/18 1106

## 2018-04-04 LAB — URINE CULTURE

## 2018-04-16 ENCOUNTER — Other Ambulatory Visit: Payer: Self-pay | Admitting: Internal Medicine

## 2018-04-26 DIAGNOSIS — H5789 Other specified disorders of eye and adnexa: Secondary | ICD-10-CM | POA: Diagnosis not present

## 2018-04-26 DIAGNOSIS — Z79899 Other long term (current) drug therapy: Secondary | ICD-10-CM | POA: Diagnosis not present

## 2018-04-26 DIAGNOSIS — M0609 Rheumatoid arthritis without rheumatoid factor, multiple sites: Secondary | ICD-10-CM | POA: Diagnosis not present

## 2018-04-26 DIAGNOSIS — M0579 Rheumatoid arthritis with rheumatoid factor of multiple sites without organ or systems involvement: Secondary | ICD-10-CM | POA: Diagnosis not present

## 2018-04-26 DIAGNOSIS — M25532 Pain in left wrist: Secondary | ICD-10-CM | POA: Diagnosis not present

## 2018-05-21 ENCOUNTER — Ambulatory Visit (INDEPENDENT_AMBULATORY_CARE_PROVIDER_SITE_OTHER): Payer: Medicare Other | Admitting: Internal Medicine

## 2018-05-21 ENCOUNTER — Encounter: Payer: Self-pay | Admitting: Internal Medicine

## 2018-05-21 DIAGNOSIS — K219 Gastro-esophageal reflux disease without esophagitis: Secondary | ICD-10-CM | POA: Diagnosis not present

## 2018-05-21 DIAGNOSIS — D649 Anemia, unspecified: Secondary | ICD-10-CM

## 2018-05-21 DIAGNOSIS — M1990 Unspecified osteoarthritis, unspecified site: Secondary | ICD-10-CM

## 2018-05-21 DIAGNOSIS — R945 Abnormal results of liver function studies: Secondary | ICD-10-CM

## 2018-05-21 DIAGNOSIS — R634 Abnormal weight loss: Secondary | ICD-10-CM

## 2018-05-21 DIAGNOSIS — I152 Hypertension secondary to endocrine disorders: Secondary | ICD-10-CM

## 2018-05-21 DIAGNOSIS — D472 Monoclonal gammopathy: Secondary | ICD-10-CM | POA: Diagnosis not present

## 2018-05-21 DIAGNOSIS — E1159 Type 2 diabetes mellitus with other circulatory complications: Secondary | ICD-10-CM | POA: Diagnosis not present

## 2018-05-21 DIAGNOSIS — E7800 Pure hypercholesterolemia, unspecified: Secondary | ICD-10-CM

## 2018-05-21 DIAGNOSIS — I10 Essential (primary) hypertension: Secondary | ICD-10-CM

## 2018-05-21 DIAGNOSIS — R7989 Other specified abnormal findings of blood chemistry: Secondary | ICD-10-CM

## 2018-05-21 DIAGNOSIS — M069 Rheumatoid arthritis, unspecified: Secondary | ICD-10-CM

## 2018-05-21 DIAGNOSIS — F439 Reaction to severe stress, unspecified: Secondary | ICD-10-CM | POA: Diagnosis not present

## 2018-05-21 MED ORDER — DILTIAZEM HCL ER 240 MG PO CP24: 240.0000 mg | ORAL_CAPSULE | Freq: Every day | ORAL | 1 refills | Status: DC

## 2018-05-21 MED ORDER — CLOTRIMAZOLE-BETAMETHASONE 1-0.05 % EX CREA: 1.0000 "application " | TOPICAL_CREAM | Freq: Two times a day (BID) | CUTANEOUS | 0 refills | Status: DC

## 2018-05-21 NOTE — Progress Notes (Signed)
Patient ID: Stacey Snyder, female   DOB: 09-Aug-1932, 85 y.o.   MRN: [id number]   Subjective:    Patient ID: Stacey Snyder, female    DOB: May 14, 1932, 85 y.o.   MRN: [id number]  HPI  Patient here for a scheduled follow up.  She reports that her husband was sick recently.  Around beginning of January, she became sick.  Nausea and diarrhea.  No vomiting.  Symptoms have resolved now.  Feels back to her baseline.  No chest pain.  No sob.  No acid reflux.  No abdominal pain.  Bowels moving.  No urine change.  Increased stress with her husband's medical issues.  Discussed with her today.  She feels she is handling things relatively well.  Does not feel needs any further intervention.     Past Medical History:  Diagnosis Date  . Diverticulosis   . Fibrocystic breast disease   . GERD (gastroesophageal reflux disease)   . History of kidney stones   . Hypercholesterolemia   . Hypertension   . Hypertension   . IBS (irritable bowel syndrome)   . Inflammatory arthritis    positive anti-CCP abs, s/p prednisone, MTX, Remicade  . Nephrolithiasis   . Osteopenia    GI upset with Fosamax  . Pancreatitis 2007   s/p ERCP  . Pancreatitis   . PONV (postoperative nausea and vomiting)   . Renal insufficiency   . Spinal stenosis of lumbosacral region    Past Surgical History:  Procedure Laterality Date  . ABDOMINAL HYSTERECTOMY  1993   ovaries not removed  . BREAST BIOPSY Left 1984   negative  . BREAST CYST EXCISION Left 2010   negative  . LITHOTRIPSY    . LUMBAR LAMINECTOMY  1983  . LUMBAR LAMINECTOMY/DECOMPRESSION MICRODISCECTOMY N/A 08/18/2016   Procedure: LUMBAR LAMINECTOMY/DECOMPRESSION MICRODISCECTOMY 1 LEVEL;  Surgeon: Bayard Hugger, MD;  Location: [facility] ORS;  Service: Neurosurgery;  Laterality: N/A;  L4-5 Laminectomy, MIS, L5 foraminotomy  . TRIGGER FINGER RELEASE     right ring finger   Family History  Problem Relation Age of Onset  . Ovarian cancer Mother   . Renal  Disease Mother   . Heart disease Mother   . Diabetes Mother   . Hypertension Mother   . Breast cancer Neg Hx    Social History   Socioeconomic History  . Marital status: Married    Spouse name: Not on file  . Number of children: 4  . Years of education: Not on file  . Highest education level: Not on file  Occupational History  . Not on file  Social Needs  . Financial resource strain: Not on file  . Food insecurity:    Worry: Not on file    Inability: Not on file  . Transportation needs:    Medical: Not on file    Non-medical: Not on file  Tobacco Use  . Smoking status: Never Smoker  . Smokeless tobacco: Never Used  Substance and Sexual Activity  . Alcohol use: No    Alcohol/week: 0.0 standard drinks  . Drug use: No  . Sexual activity: Not on file  Lifestyle  . Physical activity:    Days per week: Not on file    Minutes per session: Not on file  . Stress: Not on file  Relationships  . Social connections:    Talks on phone: Not on file    Gets together: Not on file    Attends religious service: Not on file  Active member of club or organization: Not on file    Attends meetings of clubs or organizations: Not on file    Relationship status: Not on file  Other Topics Concern  . Not on file  Social History Narrative  . Not on file    Outpatient Encounter Medications as of 05/21/2018  Medication Sig  . acyclovir (ZOVIRAX) 400 MG tablet As directed  . Cholecalciferol (VITAMIN D) 2000 UNITS tablet Take 2,000 Units by mouth daily.  . clotrimazole-betamethasone (LOTRISONE) cream Apply 1 application topically 2 (two) times daily.  Marland Kitchen diltiazem (DILACOR XR) 240 MG 24 hr capsule Take 1 capsule (240 mg total) by mouth daily.  . dorzolamide-timolol (COSOPT) 22.3-6.8 MG/ML ophthalmic solution INSTILL 1 DROP INTO EACH EYE TWICE DAILY  . fluticasone (FLONASE) 50 MCG/ACT nasal spray USE TWO SPRAY(S) IN EACH NOSTRIL ONCE DAILY  . folic acid (FOLVITE) 1 MG tablet Take 1 mg by  mouth 2 (two) times daily.  Marland Kitchen gabapentin (NEURONTIN) 300 MG capsule Take 1 capsule (300 mg total) by mouth at bedtime.  . InFLIXimab (REMICADE IV) Inject into the vein every 6 (six) weeks. Per Dr Jefm Bryant  . latanoprost (XALATAN) 0.005 % ophthalmic solution   . losartan (COZAAR) 100 MG tablet TAKE 1 TABLET DAILY  . lovastatin (MEVACOR) 40 MG tablet Take 1 tablet (40 mg total) by mouth daily.  . methotrexate 25 MG/ML SOLN Inject 17.5 mg into the skin once a week. Patient takes 17.5 mg (0.7 ml) on Monday or Tuesday of each week.  Marland Kitchen omeprazole (PRILOSEC) 20 MG capsule TAKE 1 CAPSULE DAILY  . ondansetron (ZOFRAN) 4 MG tablet Take 1 tablet (4 mg total) by mouth every 6 (six) hours as needed for nausea or vomiting.  . polyethylene glycol (MIRALAX / GLYCOLAX) packet Take 17 g by mouth daily.  . psyllium (METAMUCIL) 58.6 % packet Take 1 packet by mouth daily.  Marland Kitchen triamterene-hydrochlorothiazide (MAXZIDE-25) 37.5-25 MG tablet TAKE 1 TABLET DAILY  . [DISCONTINUED] cephALEXin (KEFLEX) 500 MG capsule Take 1 capsule (500 mg total) by mouth 2 (two) times daily.  . [DISCONTINUED] diltiazem (DILACOR XR) 240 MG 24 hr capsule Take 1 capsule (240 mg total) by mouth daily.   No facility-administered encounter medications on file as of 05/21/2018.     Review of Systems  Constitutional: Negative for appetite change and unexpected weight change.  HENT: Negative for congestion and sinus pressure.   Respiratory: Negative for cough, chest tightness and shortness of breath.   Cardiovascular: Negative for chest pain, palpitations and leg swelling.  Gastrointestinal: Negative for abdominal pain, diarrhea, nausea and vomiting.  Genitourinary: Negative for difficulty urinating and dysuria.  Musculoskeletal: Negative for joint swelling and myalgias.  Skin: Negative for color change and rash.  Neurological: Negative for dizziness, light-headedness and headaches.  Psychiatric/Behavioral: Negative for agitation and dysphoric  mood.       Objective:    Physical Exam Constitutional:      General: She is not in acute distress.    Appearance: Normal appearance.  HENT:     Nose: Nose normal. No congestion.     Mouth/Throat:     Pharynx: No oropharyngeal exudate or posterior oropharyngeal erythema.  Neck:     Musculoskeletal: Neck supple. No muscular tenderness.     Thyroid: No thyromegaly.  Cardiovascular:     Rate and Rhythm: Normal rate and regular rhythm.  Pulmonary:     Effort: No respiratory distress.     Breath sounds: Normal breath sounds. No wheezing.  Abdominal:  General: Bowel sounds are normal.     Palpations: Abdomen is soft.     Tenderness: There is no abdominal tenderness.  Musculoskeletal:        General: No swelling or tenderness.  Lymphadenopathy:     Cervical: No cervical adenopathy.  Skin:    Findings: No erythema or rash.  Neurological:     Mental Status: She is alert.  Psychiatric:        Mood and Affect: Mood normal.        Thought Content: Thought content normal.     BP 122/60 (BP Location: Left Arm, Patient Position: Sitting)   Pulse 68   Temp 97.8 F (36.6 C) (Oral)   Resp 16   Wt 115 lb (52.2 kg)   SpO2 96%   BMI 21.73 kg/m  Wt Readings from Last 3 Encounters:  05/21/18 115 lb (52.2 kg)  04/02/18 118 lb (53.5 kg)  03/05/18 116 lb 6.4 oz (52.8 kg)     Lab Results  Component Value Date   WBC 8.3 12/02/2017   HGB 11.7 (L) 12/02/2017   HCT 34.9 (L) 12/02/2017   PLT 358.0 12/02/2017   GLUCOSE 96 12/08/2017   CHOL 180 04/21/2017   TRIG 96.0 04/21/2017   HDL 74.80 04/21/2017   LDLCALC 86 04/21/2017   ALT 19 12/02/2017   AST 27 12/02/2017   NA 136 12/08/2017   K 3.5 12/08/2017   CL 99 12/08/2017   CREATININE 1.22 (H) 12/08/2017   BUN 22 12/08/2017   CO2 28 12/08/2017   TSH 1.51 12/02/2017   INR 0.92 08/07/2016       Assessment & Plan:   Problem List Items Addressed This Visit    Abnormal liver function tests    Follow liver panel.          Anemia    Follow cbc.       GERD (gastroesophageal reflux disease)    Controlled on omeprazole.        Hypercholesterolemia    On lovastatin.  Follow lipid panel and liver function tests.        Relevant Medications   diltiazem (DILACOR XR) 240 MG 24 hr capsule   Other Relevant Orders   Hepatic function panel   Lipid panel   Hypertension    Blood pressure under good control.  Continue same medication regimen.  Follow pressures.  Follow metabolic panel.        Relevant Medications   diltiazem (DILACOR XR) 240 MG 24 hr capsule   Other Relevant Orders   Basic metabolic panel   Loss of weight    Discussed with her today.  Discussed nutritional shakes, etc.  Follow.  Reports has good appetite.        MGUS (monoclonal gammopathy of unknown significance)    Was followed by hematology.  Follow.       Rheumatoid arthritis ([place])    Followed by Dr Jefm Bryant.  Receiving remicade.        Stress    Increased stress as outlined.  Discussed with her today.  Desires no further intervention.  Follow.        Other Visit Diagnoses    Hypertension associated with diabetes ([place])       Relevant Medications   diltiazem (DILACOR XR) 240 MG 24 hr capsule       Einar Pheasant, MD

## 2018-05-23 ENCOUNTER — Encounter: Payer: Self-pay | Admitting: Internal Medicine

## 2018-05-23 NOTE — Assessment & Plan Note (Signed)
Was followed by hematology.  Follow.

## 2018-05-23 NOTE — Assessment & Plan Note (Signed)
On lovastatin.  Follow lipid panel and liver function tests.   

## 2018-05-23 NOTE — Assessment & Plan Note (Signed)
Increased stress as outlined.  Discussed with her today.  Desires no further intervention.  Follow.

## 2018-05-23 NOTE — Assessment & Plan Note (Signed)
Controlled on omeprazole.   

## 2018-05-23 NOTE — Assessment & Plan Note (Signed)
Blood pressure under good control.  Continue same medication regimen.  Follow pressures.  Follow metabolic panel.   

## 2018-05-23 NOTE — Assessment & Plan Note (Signed)
Follow cbc.  

## 2018-05-23 NOTE — Assessment & Plan Note (Signed)
Followed by Dr Jefm Bryant.  Receiving remicade.

## 2018-05-23 NOTE — Assessment & Plan Note (Signed)
Discussed with her today.  Discussed nutritional shakes, etc.  Follow.  Reports has good appetite.

## 2018-05-23 NOTE — Assessment & Plan Note (Signed)
Follow liver panel.  

## 2018-06-02 DIAGNOSIS — H4043X4 Glaucoma secondary to eye inflammation, bilateral, indeterminate stage: Secondary | ICD-10-CM | POA: Diagnosis not present

## 2018-06-08 ENCOUNTER — Other Ambulatory Visit: Payer: Self-pay | Admitting: Internal Medicine

## 2018-06-08 DIAGNOSIS — E785 Hyperlipidemia, unspecified: Secondary | ICD-10-CM

## 2018-06-08 MED ORDER — LOVASTATIN 40 MG PO TABS: 40.0000 mg | ORAL_TABLET | Freq: Every day | ORAL | 0 refills | Status: DC

## 2018-06-08 MED ORDER — GABAPENTIN 300 MG PO CAPS: 300.0000 mg | ORAL_CAPSULE | Freq: Every day | ORAL | 1 refills | Status: DC

## 2018-06-08 NOTE — Telephone Encounter (Signed)
Copied from [place] [id number]. Topic: Quick Communication - Rx Refill/Question >> Jun 08, 2018  1:16 PM Mcneil, Ja-Kwan wrote: Medication: lovastatin (MEVACOR) 40 MG tablet and gabapentin (NEURONTIN) 300 MG capsule  Has the patient contacted their pharmacy? yes   Preferred Pharmacy (with phone number or street name): [facility] [place], [place] to [facility] [employer] [facility] [phone] (Phone)  [phone] (Fax)  Agent: Please be advised that RX refills may take up to 3 business days. We ask that you follow-up with your pharmacy.

## 2018-06-21 DIAGNOSIS — M0579 Rheumatoid arthritis with rheumatoid factor of multiple sites without organ or systems involvement: Secondary | ICD-10-CM | POA: Diagnosis not present

## 2018-06-21 DIAGNOSIS — Z79899 Other long term (current) drug therapy: Secondary | ICD-10-CM | POA: Diagnosis not present

## 2018-07-13 ENCOUNTER — Other Ambulatory Visit: Payer: Self-pay | Admitting: Internal Medicine

## 2018-07-13 ENCOUNTER — Inpatient Hospital Stay: Payer: Medicare Other | Attending: Hematology and Oncology

## 2018-07-13 DIAGNOSIS — D472 Monoclonal gammopathy: Secondary | ICD-10-CM

## 2018-07-13 DIAGNOSIS — D649 Anemia, unspecified: Secondary | ICD-10-CM | POA: Insufficient documentation

## 2018-07-13 LAB — CBC WITH DIFFERENTIAL/PLATELET
ABS IMMATURE GRANULOCYTES: 0.02 10*3/uL (ref 0.00–0.07)
BASOS PCT: 1 %
Basophils Absolute: 0 10*3/uL (ref 0.0–0.1)
EOS ABS: 0.2 10*3/uL (ref 0.0–0.5)
Eosinophils Relative: 3 %
HCT: 36.3 % (ref 36.0–46.0)
Hemoglobin: 12.1 g/dL (ref 12.0–15.0)
Immature Granulocytes: 0 %
Lymphocytes Relative: 45 %
Lymphs Abs: 2.9 10*3/uL (ref 0.7–4.0)
MCH: 31.2 pg (ref 26.0–34.0)
MCHC: 33.3 g/dL (ref 30.0–36.0)
MCV: 93.6 fL (ref 80.0–100.0)
MONO ABS: 0.5 10*3/uL (ref 0.1–1.0)
Monocytes Relative: 8 %
NEUTROS ABS: 2.8 10*3/uL (ref 1.7–7.7)
Neutrophils Relative %: 43 %
PLATELETS: 286 10*3/uL (ref 150–400)
RBC: 3.88 MIL/uL (ref 3.87–5.11)
RDW: 14 % (ref 11.5–15.5)
WBC: 6.4 10*3/uL (ref 4.0–10.5)
nRBC: 0 % (ref 0.0–0.2)

## 2018-07-13 LAB — COMPREHENSIVE METABOLIC PANEL
ALBUMIN: 4.2 g/dL (ref 3.5–5.0)
ALK PHOS: 83 U/L (ref 38–126)
ALT: 14 U/L (ref 0–44)
AST: 24 U/L (ref 15–41)
Anion gap: 7 (ref 5–15)
BUN: 27 mg/dL — ABNORMAL HIGH (ref 8–23)
CHLORIDE: 98 mmol/L (ref 98–111)
CO2: 27 mmol/L (ref 22–32)
CREATININE: 1.05 mg/dL — AB (ref 0.44–1.00)
Calcium: 9.4 mg/dL (ref 8.9–10.3)
GFR calc Af Amer: 57 mL/min — ABNORMAL LOW (ref >60)
GFR calc non Af Amer: 49 mL/min — ABNORMAL LOW (ref >60)
Glucose, Bld: 104 mg/dL — ABNORMAL HIGH (ref 70–99)
Potassium: 3.4 mmol/L — ABNORMAL LOW (ref 3.5–5.1)
SODIUM: 132 mmol/L — AB (ref 135–145)
Total Bilirubin: 1 mg/dL (ref 0.3–1.2)
Total Protein: 7.5 g/dL (ref 6.5–8.1)

## 2018-07-13 LAB — IRON AND TIBC
IRON: 87 ug/dL (ref 28–170)
SATURATION RATIOS: 27 % (ref 10.4–31.8)
TIBC: 328 ug/dL (ref 250–450)
UIBC: 241 ug/dL

## 2018-07-13 LAB — FERRITIN: Ferritin: 198 ng/mL (ref 11–307)

## 2018-07-14 LAB — MULTIPLE MYELOMA PANEL, SERUM
ALBUMIN/GLOB SERPL: 1.3 (ref 0.7–1.7)
ALPHA 1: 0.2 g/dL (ref 0.0–0.4)
Albumin SerPl Elph-Mcnc: 3.8 g/dL (ref 2.9–4.4)
Alpha2 Glob SerPl Elph-Mcnc: 0.7 g/dL (ref 0.4–1.0)
B-Globulin SerPl Elph-Mcnc: 0.9 g/dL (ref 0.7–1.3)
Gamma Glob SerPl Elph-Mcnc: 1.2 g/dL (ref 0.4–1.8)
Globulin, Total: 3 g/dL (ref 2.2–3.9)
IGA: 223 mg/dL (ref 64–422)
IGM (IMMUNOGLOBULIN M), SRM: 235 mg/dL — AB (ref 26–217)
IgG (Immunoglobin G), Serum: 1201 mg/dL (ref 700–1600)
Total Protein ELP: 6.8 g/dL (ref 6.0–8.5)

## 2018-07-14 LAB — KAPPA/LAMBDA LIGHT CHAINS
KAPPA FREE LGHT CHN: 37.1 mg/L — AB (ref 3.3–19.4)
KAPPA, LAMDA LIGHT CHAIN RATIO: 1.56 (ref 0.26–1.65)
LAMDA FREE LIGHT CHAINS: 23.8 mg/L (ref 5.7–26.3)

## 2018-07-15 ENCOUNTER — Other Ambulatory Visit: Payer: Self-pay

## 2018-07-15 DIAGNOSIS — D472 Monoclonal gammopathy: Secondary | ICD-10-CM

## 2018-07-20 ENCOUNTER — Telehealth: Payer: Self-pay | Admitting: Internal Medicine

## 2018-07-20 ENCOUNTER — Ambulatory Visit: Payer: Medicare Other | Admitting: Internal Medicine

## 2018-07-20 ENCOUNTER — Inpatient Hospital Stay: Payer: Medicare Other | Admitting: Hematology and Oncology

## 2018-07-20 NOTE — Telephone Encounter (Signed)
I left voicemail for patient notifying her of these results. I advised patient to call back with any questions.

## 2018-07-20 NOTE — Telephone Encounter (Signed)
Heather/Brooke-please inform patient that labs look okay;   Recommend follow-up in 4 months-MD-/CBC CMP.

## 2018-08-16 DIAGNOSIS — Z79899 Other long term (current) drug therapy: Secondary | ICD-10-CM | POA: Diagnosis not present

## 2018-08-16 DIAGNOSIS — M0579 Rheumatoid arthritis with rheumatoid factor of multiple sites without organ or systems involvement: Secondary | ICD-10-CM | POA: Diagnosis not present

## 2018-08-16 DIAGNOSIS — M0609 Rheumatoid arthritis without rheumatoid factor, multiple sites: Secondary | ICD-10-CM | POA: Diagnosis not present

## 2018-08-20 ENCOUNTER — Telehealth: Payer: Self-pay | Admitting: Internal Medicine

## 2018-08-20 NOTE — Telephone Encounter (Signed)
Pt had labs done at [facility] clinic on 04/13. Pt is not sure if that's the labs that's needed. Please advise? Thank you!

## 2018-08-23 NOTE — Telephone Encounter (Signed)
Requested lab results from [facility] that patient had done on 08/16/18

## 2018-08-24 NOTE — Telephone Encounter (Signed)
Ok

## 2018-08-30 ENCOUNTER — Other Ambulatory Visit: Payer: Self-pay | Admitting: Internal Medicine

## 2018-08-30 ENCOUNTER — Other Ambulatory Visit: Payer: Medicare Other

## 2018-08-30 DIAGNOSIS — K219 Gastro-esophageal reflux disease without esophagitis: Secondary | ICD-10-CM

## 2018-09-02 ENCOUNTER — Telehealth: Payer: Self-pay | Admitting: Internal Medicine

## 2018-09-02 ENCOUNTER — Other Ambulatory Visit: Payer: Self-pay

## 2018-09-02 ENCOUNTER — Ambulatory Visit: Payer: Medicare Other | Admitting: Internal Medicine

## 2018-09-02 DIAGNOSIS — E785 Hyperlipidemia, unspecified: Secondary | ICD-10-CM

## 2018-09-02 MED ORDER — LOVASTATIN 40 MG PO TABS: 40.0000 mg | ORAL_TABLET | Freq: Every day | ORAL | 0 refills | Status: DC

## 2018-09-02 NOTE — Progress Notes (Signed)
Medication was sent to pharmacy.  Mevacor 90 with no refills.  [place],cma

## 2018-09-02 NOTE — Telephone Encounter (Signed)
Copied from [place] [id number]. Topic: Quick Communication - Rx Refill/Question >> Sep 02, 2018  1:56 PM Richardo Priest, NT wrote: Medication:  lovastatin (MEVACOR) 40 MG tablet  Has the patient contacted their pharmacy? Yes patient states she is in need of a refill. Has no more. Preferred Pharmacy (with phone number or street name):  [facility] [place], [place] to [facility] [employer] [facility] [phone] (Phone) [phone] (Fax)  Agent: Please be advised that RX refills may take up to 3 business days. We ask that you follow-up with your pharmacy.

## 2018-09-21 ENCOUNTER — Telehealth: Payer: Self-pay | Admitting: *Deleted

## 2018-09-21 ENCOUNTER — Other Ambulatory Visit: Payer: Self-pay | Admitting: Internal Medicine

## 2018-09-21 DIAGNOSIS — E785 Hyperlipidemia, unspecified: Secondary | ICD-10-CM

## 2018-09-21 NOTE — Telephone Encounter (Signed)
LMTCB to reschedule f/u appt. Was due for a 2 mth f/u in March.

## 2018-09-23 ENCOUNTER — Other Ambulatory Visit: Payer: Self-pay

## 2018-09-23 ENCOUNTER — Ambulatory Visit (INDEPENDENT_AMBULATORY_CARE_PROVIDER_SITE_OTHER): Payer: Medicare Other | Admitting: Internal Medicine

## 2018-09-23 ENCOUNTER — Encounter: Payer: Self-pay | Admitting: Internal Medicine

## 2018-09-23 DIAGNOSIS — D649 Anemia, unspecified: Secondary | ICD-10-CM

## 2018-09-23 DIAGNOSIS — R634 Abnormal weight loss: Secondary | ICD-10-CM

## 2018-09-23 DIAGNOSIS — E7800 Pure hypercholesterolemia, unspecified: Secondary | ICD-10-CM | POA: Diagnosis not present

## 2018-09-23 DIAGNOSIS — F439 Reaction to severe stress, unspecified: Secondary | ICD-10-CM | POA: Diagnosis not present

## 2018-09-23 DIAGNOSIS — M069 Rheumatoid arthritis, unspecified: Secondary | ICD-10-CM | POA: Diagnosis not present

## 2018-09-23 DIAGNOSIS — I10 Essential (primary) hypertension: Secondary | ICD-10-CM | POA: Diagnosis not present

## 2018-09-23 DIAGNOSIS — K219 Gastro-esophageal reflux disease without esophagitis: Secondary | ICD-10-CM

## 2018-09-23 DIAGNOSIS — R945 Abnormal results of liver function studies: Secondary | ICD-10-CM

## 2018-09-23 DIAGNOSIS — R7989 Other specified abnormal findings of blood chemistry: Secondary | ICD-10-CM

## 2018-09-23 MED ORDER — ACYCLOVIR 400 MG PO TABS: ORAL_TABLET | ORAL | 0 refills | Status: DC

## 2018-09-23 NOTE — Progress Notes (Signed)
Patient ID: Stacey Snyder, female   DOB: June 29, 1932, 85 y.o.   MRN: [id number]   Virtual Visit via telephone Note  This visit type was conducted due to national recommendations for restrictions regarding the COVID-19 pandemic (e.g. social distancing).  This format is felt to be most appropriate for this patient at this time.  All issues noted in this document were discussed and addressed.  No physical exam was performed (except for noted visual exam findings with Video Visits).   I connected with Stacey Snyder by telephone and verified that I am speaking with the correct person using two identifiers. Location patient: home Location provider: work Persons participating in the virtual visit: patient, provider  I discussed the limitations, risks, security and privacy concerns of performing an evaluation and management service by telephone and the availability of in person appointments.  The patient expressed understanding and agreed to proceed.   Reason for visit: scheduled follow up.   HPI: Reports increased stress regarding her husband's health issues.  Discussed with her today.  She desires no further intervention.  Overall she feels she is handling things relatively well.  Stays active.  No chest pain.  No sob.  No acid reflux. On omeprazole.  No abdominal pain.  Reports no recent GI flares.  Discussed recent labs.  Drawn through rheumatology.  Had remicade infusion 08/16/18.  States her weight is stable.  Eating well.  Good appetite.     ROS: See pertinent positives and negatives per HPI.  Past Medical History:  Diagnosis Date  . Diverticulosis   . Fibrocystic breast disease   . GERD (gastroesophageal reflux disease)   . History of kidney stones   . Hypercholesterolemia   . Hypertension   . Hypertension   . IBS (irritable bowel syndrome)   . Inflammatory arthritis    positive anti-CCP abs, s/p prednisone, MTX, Remicade  . Nephrolithiasis   . Osteopenia    GI upset  with Fosamax  . Pancreatitis 2007   s/p ERCP  . Pancreatitis   . PONV (postoperative nausea and vomiting)   . Renal insufficiency   . Spinal stenosis of lumbosacral region     Past Surgical History:  Procedure Laterality Date  . ABDOMINAL HYSTERECTOMY  1993   ovaries not removed  . BREAST BIOPSY Left 1984   negative  . BREAST CYST EXCISION Left 2010   negative  . LITHOTRIPSY    . LUMBAR LAMINECTOMY  1983  . LUMBAR LAMINECTOMY/DECOMPRESSION MICRODISCECTOMY N/A 08/18/2016   Procedure: LUMBAR LAMINECTOMY/DECOMPRESSION MICRODISCECTOMY 1 LEVEL;  Surgeon: Bayard Hugger, MD;  Location: [facility] ORS;  Service: Neurosurgery;  Laterality: N/A;  L4-5 Laminectomy, MIS, L5 foraminotomy  . TRIGGER FINGER RELEASE     right ring finger    Family History  Problem Relation Age of Onset  . Ovarian cancer Mother   . Renal Disease Mother   . Heart disease Mother   . Diabetes Mother   . Hypertension Mother   . Breast cancer Neg Hx     SOCIAL HX: reviewed.    Current Outpatient Medications:  .  Cholecalciferol (VITAMIN D) 2000 UNITS tablet, Take 2,000 Units by mouth daily., Disp: , Rfl:  .  clotrimazole-betamethasone (LOTRISONE) cream, Apply 1 application topically 2 (two) times daily., Disp: 30 g, Rfl: 0 .  diltiazem (DILACOR XR) 240 MG 24 hr capsule, Take 1 capsule (240 mg total) by mouth daily., Disp: 90 capsule, Rfl: 1 .  dorzolamide-timolol (COSOPT) 22.3-6.8 MG/ML ophthalmic solution, INSTILL 1 DROP INTO  EACH EYE TWICE DAILY, Disp: , Rfl: 5 .  fluticasone (FLONASE) 50 MCG/ACT nasal spray, USE TWO SPRAY(S) IN EACH NOSTRIL ONCE DAILY, Disp: 16 g, Rfl: 11 .  folic acid (FOLVITE) 1 MG tablet, Take 1 mg by mouth 2 (two) times daily., Disp: , Rfl: 1 .  gabapentin (NEURONTIN) 300 MG capsule, Take 1 capsule (300 mg total) by mouth at bedtime., Disp: 90 capsule, Rfl: 1 .  InFLIXimab (REMICADE IV), Inject into the vein every 6 (six) weeks. Per Dr Jefm Bryant, Disp: , Rfl:  .  latanoprost (XALATAN) 0.005 %  ophthalmic solution, , Disp: , Rfl:  .  losartan (COZAAR) 100 MG tablet, TAKE 1 TABLET DAILY, Disp: 90 tablet, Rfl: 1 .  lovastatin (MEVACOR) 40 MG tablet, TAKE 1 TABLET DAILY, Disp: 90 tablet, Rfl: 1 .  methotrexate 25 MG/ML SOLN, Inject 17.5 mg into the skin once a week. Patient takes 17.5 mg (0.7 ml) on Monday or Tuesday of each week., Disp: , Rfl:  .  omeprazole (PRILOSEC) 20 MG capsule, TAKE 1 CAPSULE DAILY, Disp: 90 capsule, Rfl: 1 .  ondansetron (ZOFRAN) 4 MG tablet, Take 1 tablet (4 mg total) by mouth every 6 (six) hours as needed for nausea or vomiting., Disp: 20 tablet, Rfl: 0 .  polyethylene glycol (MIRALAX / GLYCOLAX) packet, Take 17 g by mouth daily., Disp: , Rfl:  .  psyllium (METAMUCIL) 58.6 % packet, Take 1 packet by mouth daily., Disp: , Rfl:  .  triamterene-hydrochlorothiazide (MAXZIDE-25) 37.5-25 MG tablet, TAKE 1 TABLET DAILY, Disp: 90 tablet, Rfl: 1 .  acyclovir (ZOVIRAX) 400 MG tablet, Use as directed., Disp: 40 tablet, Rfl: 0  EXAM:  GENERAL: alert.  Sounds to be in no acute distress.  Answering questions appropriately.    PSYCH/NEURO: pleasant and cooperative, no obvious depression or anxiety, speech and thought processing grossly intact  ASSESSMENT AND PLAN:  Discussed the following assessment and plan:  Abnormal liver function tests  Anemia, unspecified type  Gastroesophageal reflux disease, esophagitis presence not specified  Hypercholesterolemia  Essential hypertension  Loss of weight  Rheumatoid arthritis, involving unspecified site, unspecified rheumatoid factor presence (HCC)  Stress  Abnormal liver function tests Follow liver panel.   Anemia hgb 08/16/18 - 11.7.  Follow cbc.   GERD (gastroesophageal reflux disease) Controlled on omeprazole.   Hypercholesterolemia On lovastatin.  Follow lipid panel and liver function tests.    Hypertension Blood pressure under good control.  Continue same medication regimen.  Follow pressures.  Follow  metabolic panel.    Loss of weight States her weight is stable.  Eating better.  Follow.    Rheumatoid arthritis ([place]) Followed by Dr Jefm Bryant.  Receiving remicade infusion.  Last 08/2016.    Stress Increased stress as outlined.  Discussed with her today.  She feels she is handling things relatively well.  Follow.      I discussed the assessment and treatment plan with the patient. The patient was provided an opportunity to ask questions and all were answered. The patient agreed with the plan and demonstrated an understanding of the instructions.   The patient was advised to call back or seek an in-person evaluation if the symptoms worsen or if the condition fails to improve as anticipated.  I provided 20 minutes of non-face-to-face time during this encounter.   Einar Pheasant, MD

## 2018-09-26 ENCOUNTER — Encounter: Payer: Self-pay | Admitting: Internal Medicine

## 2018-09-26 NOTE — Assessment & Plan Note (Signed)
hgb 08/16/18 - 11.7.  Follow cbc.

## 2018-09-26 NOTE — Assessment & Plan Note (Signed)
Follow liver panel.  

## 2018-09-26 NOTE — Assessment & Plan Note (Signed)
Followed by Dr Jefm Bryant.  Receiving remicade infusion.  Last 08/2016.

## 2018-09-26 NOTE — Assessment & Plan Note (Signed)
On lovastatin.  Follow lipid panel and liver function tests.   

## 2018-09-26 NOTE — Assessment & Plan Note (Signed)
Controlled on omeprazole.   

## 2018-09-26 NOTE — Assessment & Plan Note (Signed)
Blood pressure under good control.  Continue same medication regimen.  Follow pressures.  Follow metabolic panel.   

## 2018-09-26 NOTE — Assessment & Plan Note (Signed)
Increased stress as outlined.  Discussed with her today.  She feels she is handling things relatively well.  Follow.

## 2018-09-26 NOTE — Assessment & Plan Note (Signed)
States her weight is stable.  Eating better.  Follow.

## 2018-10-04 ENCOUNTER — Telehealth: Payer: Self-pay | Admitting: Internal Medicine

## 2018-10-04 NOTE — Telephone Encounter (Signed)
Lm to call office and set up a yearly follow up, do not schedule at [facility]pe, insurance will not pay for cpe.

## 2018-10-14 ENCOUNTER — Other Ambulatory Visit: Payer: Self-pay

## 2018-10-14 ENCOUNTER — Ambulatory Visit
Admission: EM | Admit: 2018-10-14 | Discharge: 2018-10-14 | Disposition: A | Discharge status: Home / Self Care | Payer: Medicare Other | Attending: Family Medicine | Admitting: Family Medicine

## 2018-10-14 DIAGNOSIS — N3000 Acute cystitis without hematuria: Secondary | ICD-10-CM | POA: Diagnosis not present

## 2018-10-14 LAB — URINALYSIS, COMPLETE (UACMP) WITH MICROSCOPIC
Bacteria, UA: NONE SEEN
Squamous Epithelial / LPF: NONE SEEN (ref 0–5)

## 2018-10-14 MED ORDER — CEPHALEXIN 500 MG PO CAPS: 500.0000 mg | ORAL_CAPSULE | Freq: Two times a day (BID) | ORAL | 0 refills | Status: DC

## 2018-10-14 NOTE — ED Triage Notes (Signed)
Patient complains of urinary frequency, urinary urgency, chills and fatigue x yesterday.

## 2018-10-14 NOTE — ED Provider Notes (Signed)
[facility]    CSN: [id number] Arrival date & time: 10/14/18  2094  History   Chief Complaint Chief Complaint  Patient presents with  . Urinary Frequency   HPI  85 year female presents with UTI.  Patient reports her symptoms started yesterday.  She reports urinary frequency, urgency, and dysuria.  Also reports chills and associated fatigue.  No back pain or flank pain.  No fever.  She has taken some Azo without resolution.  No known exacerbating factors.  No other reported symptoms.  No other complaints or concerns at this time.  PMH, Surgical Hx, Family Hx, Social History reviewed and updated as below.  Past Medical History:  Diagnosis Date  . Diverticulosis   . Fibrocystic breast disease   . GERD (gastroesophageal reflux disease)   . History of kidney stones   . Hypercholesterolemia   . Hypertension   . Hypertension   . IBS (irritable bowel syndrome)   . Inflammatory arthritis    positive anti-CCP abs, s/p prednisone, MTX, Remicade  . Nephrolithiasis   . Osteopenia    GI upset with Fosamax  . Pancreatitis 2007   s/p ERCP  . Pancreatitis   . PONV (postoperative nausea and vomiting)   . Renal insufficiency   . Spinal stenosis of lumbosacral region    Patient Active Problem List   Diagnosis Date Noted  . MGUS (monoclonal gammopathy of unknown significance) 01/19/2018  . Cramps, extremity 12/02/2017  . Abdominal bruit 01/16/2017  . Lumbar stenosis 08/18/2016  . Abdominal pain 12/03/2015  . Abnormal liver function tests 10/18/2015  . Anemia 10/18/2015  . Hyponatremia 10/07/2015  . Lip lesion 04/23/2015  . Scalp lesion 04/23/2015  . Loss of weight 04/23/2015  . Stress 04/23/2015  . Left shoulder pain 12/20/2014  . UTI (urinary tract infection) 08/20/2014  . Health care maintenance 08/20/2014  . Skin lesion 04/17/2014  . Fatigue 04/13/2014  . Shoulder pain, right 12/17/2013  . Trigger finger 08/21/2013  . Left hip pain 04/17/2013  . GERD  (gastroesophageal reflux disease) 04/03/2012  . Rheumatoid arthritis ([place]) 03/30/2012  . Diverticulosis 03/30/2012  . Osteopenia 03/30/2012  . Hypertension 03/30/2012  . Hypercholesterolemia 03/30/2012   Past Surgical History:  Procedure Laterality Date  . ABDOMINAL HYSTERECTOMY  1993   ovaries not removed  . BREAST BIOPSY Left 1984   negative  . BREAST CYST EXCISION Left 2010   negative  . LITHOTRIPSY    . LUMBAR LAMINECTOMY  1983  . LUMBAR LAMINECTOMY/DECOMPRESSION MICRODISCECTOMY N/A 08/18/2016   Procedure: LUMBAR LAMINECTOMY/DECOMPRESSION MICRODISCECTOMY 1 LEVEL;  Surgeon: Bayard Hugger, MD;  Location: [facility];  Service: Neurosurgery;  Laterality: N/A;  L4-5 Laminectomy, MIS, L5 foraminotomy  . TRIGGER FINGER RELEASE     right ring finger    OB History   No obstetric history on file.    Home Medications    Prior to Admission medications   Medication Sig Start Date End Date Taking? Authorizing Provider  acyclovir (ZOVIRAX) 400 MG tablet Use as directed. 09/23/18  Yes Einar Pheasant, MD  Cholecalciferol (VITAMIN D) 2000 UNITS tablet Take 2,000 Units by mouth daily.   Yes [provider]  clotrimazole-betamethasone (LOTRISONE) cream Apply 1 application topically 2 (two) times daily. 05/21/18  Yes Einar Pheasant, MD  diltiazem (DILACOR XR) 240 MG 24 hr capsule Take 1 capsule (240 mg total) by mouth daily. 05/21/18  Yes Einar Pheasant, MD  dorzolamide-timolol (COSOPT) 22.3-6.8 MG/ML ophthalmic solution INSTILL 1 DROP INTO EACH EYE TWICE DAILY 12/17/17  Yes [provider]  fluticasone (FLONASE) 50 MCG/ACT nasal spray USE TWO SPRAY(S) IN EACH NOSTRIL ONCE DAILY 01/14/17  Yes Einar Pheasant, MD  folic acid (FOLVITE) 1 MG tablet Take 1 mg by mouth 2 (two) times daily. 12/14/17  Yes [provider]  gabapentin (NEURONTIN) 300 MG capsule Take 1 capsule (300 mg total) by mouth at bedtime. 06/08/18  Yes Einar Pheasant, MD  InFLIXimab (REMICADE IV) Inject into the  vein every 6 (six) weeks. Per Dr Jefm Bryant   Yes [provider]  latanoprost (XALATAN) 0.005 % ophthalmic solution  01/18/18  Yes [provider]  losartan (COZAAR) 100 MG tablet TAKE 1 TABLET DAILY 09/21/18  Yes Crecencio Mc, MD  lovastatin (MEVACOR) 40 MG tablet TAKE 1 TABLET DAILY 09/21/18  Yes Crecencio Mc, MD  methotrexate 25 MG/ML SOLN Inject 17.5 mg into the skin once a week. Patient takes 17.5 mg (0.7 ml) on Monday or Tuesday of each week.   Yes [provider]  omeprazole (PRILOSEC) 20 MG capsule TAKE 1 CAPSULE DAILY 08/30/18  Yes Einar Pheasant, MD  ondansetron (ZOFRAN) 4 MG tablet Take 1 tablet (4 mg total) by mouth every 6 (six) hours as needed for nausea or vomiting. 08/19/16  Yes Bayard Hugger, MD  polyethylene glycol (MIRALAX / GLYCOLAX) packet Take 17 g by mouth daily.   Yes [provider]  psyllium (METAMUCIL) 58.6 % packet Take 1 packet by mouth daily.   Yes [provider]  triamterene-hydrochlorothiazide (MAXZIDE-25) 37.5-25 MG tablet TAKE 1 TABLET DAILY 07/13/18  Yes Crecencio Mc, MD  cephALEXin (KEFLEX) 500 MG capsule Take 1 capsule (500 mg total) by mouth 2 (two) times daily. 10/14/18   Coral Spikes, DO    Family History Family History  Problem Relation Age of Onset  . Ovarian cancer Mother   . Renal Disease Mother   . Heart disease Mother   . Diabetes Mother   . Hypertension Mother   . Breast cancer Neg Hx     Social History Social History   Tobacco Use  . Smoking status: Never Smoker  . Smokeless tobacco: Never Used  Substance Use Topics  . Alcohol use: No    Alcohol/week: 0.0 standard drinks  . Drug use: No     Allergies   Carisoprodol, Codeine, Flagyl [metronidazole], Nsaids, Skelaxin [metaxalone], Tenormin [atenolol], and Tramadol   Review of Systems Review of Systems  Constitutional: Positive for fatigue. Negative for fever.  Genitourinary: Positive for dysuria, frequency and urgency.    Physical Exam Triage Vital Signs ED Triage Vitals  Enc Vitals Group     BP 10/14/18 1000 (!) 135/95     Pulse Rate 10/14/18 1000 (!) 58     Resp 10/14/18 1000 18     Temp 10/14/18 1000 97.9 F (36.6 C)     Temp Source 10/14/18 1000 Oral     SpO2 10/14/18 1000 100 %     Weight 10/14/18 0956 115 lb (52.2 kg)     Height 10/14/18 0956 5' (1.524 m)     Head Circumference --      Peak Flow --      Pain Score 10/14/18 0956 7     Pain Loc --      Pain Edu? --      Excl. in [place]? --    Updated Vital Signs BP (!) 135/95 (BP Location: Left Arm)   Pulse (!) 58   Temp 97.9 F (36.6 C) (Oral)  Resp 18   Ht 5' (1.524 m)   Wt 52.2 kg   SpO2 100%   BMI 22.46 kg/m   Visual Acuity Right Eye Distance:   Left Eye Distance:   Bilateral Distance:    Right Eye Near:   Left Eye Near:    Bilateral Near:     Physical Exam Vitals signs and nursing note reviewed.  Constitutional:      General: She is not in acute distress.    Appearance: Normal appearance.  HENT:     Head: Normocephalic and atraumatic.  Eyes:     General:        Right eye: No discharge.        Left eye: No discharge.     Conjunctiva/sclera: Conjunctivae normal.  Cardiovascular:     Rate and Rhythm: Regular rhythm. Bradycardia present.  Abdominal:     General: There is no distension.     Palpations: Abdomen is soft.     Tenderness: There is no abdominal tenderness.  Neurological:     Mental Status: She is alert.  Psychiatric:        Mood and Affect: Mood normal.        Behavior: Behavior normal.    UC Treatments / Results  Labs (all labs ordered are listed, but only abnormal results are displayed) Labs Reviewed  URINALYSIS, COMPLETE (UACMP) WITH MICROSCOPIC - Abnormal; Notable for the following components:      Result Value   Color, Urine ORANGE (*)    APPearance CLOUDY (*)    Glucose, UA   (*)    Value: TEST NOT REPORTED DUE TO COLOR INTERFERENCE OF URINE PIGMENT   Hgb urine dipstick   (*)    Value:  TEST NOT REPORTED DUE TO COLOR INTERFERENCE OF URINE PIGMENT   Bilirubin Urine   (*)    Value: TEST NOT REPORTED DUE TO COLOR INTERFERENCE OF URINE PIGMENT   Ketones, ur   (*)    Value: TEST NOT REPORTED DUE TO COLOR INTERFERENCE OF URINE PIGMENT   Protein, ur   (*)    Value: TEST NOT REPORTED DUE TO COLOR INTERFERENCE OF URINE PIGMENT   Nitrite   (*)    Value: TEST NOT REPORTED DUE TO COLOR INTERFERENCE OF URINE PIGMENT   Leukocytes,Ua   (*)    Value: TEST NOT REPORTED DUE TO COLOR INTERFERENCE OF URINE PIGMENT   All other components within normal limits  URINE CULTURE    EKG None  Radiology No results found.  Procedures Procedures (including critical care time)  Medications Ordered in UC Medications - No data to display  Initial Impression / Assessment and Plan / UC Course  I have reviewed the triage vital signs and the nursing notes.  Pertinent labs & imaging results that were available during my care of the patient were reviewed by me and considered in my medical decision making (see chart for details).    85 year old female presents with UTI. Pyuria on UA. Sending culture. Placing on Keflex. Of note creatinine clearance calculated - 33.  Final Clinical Impressions(s) / UC Diagnoses   Final diagnoses:  Acute cystitis without hematuria   Discharge Instructions   None    ED Prescriptions    Medication Sig Dispense Auth. Provider   cephALEXin (KEFLEX) 500 MG capsule Take 1 capsule (500 mg total) by mouth 2 (two) times daily. 14 capsule Coral Spikes, DO     Controlled Substance Prescriptions [place] Controlled Substance Registry consulted?  Not Applicable   Coral Spikes, DO 10/14/18 1052

## 2018-10-17 LAB — URINE CULTURE: Culture: >=100000 — AB

## 2018-10-18 ENCOUNTER — Telehealth (HOSPITAL_COMMUNITY): Payer: Self-pay | Admitting: Emergency Medicine

## 2018-10-18 NOTE — Telephone Encounter (Signed)
Patient contacted and made aware of all results, all questions answered.   

## 2018-10-18 NOTE — Telephone Encounter (Signed)
Urine culture was positive for e coli and was given keflex  at urgent care visit. Attempted to reach patient. No answer at this time.   

## 2018-10-25 DIAGNOSIS — M0579 Rheumatoid arthritis with rheumatoid factor of multiple sites without organ or systems involvement: Secondary | ICD-10-CM | POA: Diagnosis not present

## 2018-10-25 DIAGNOSIS — Z79899 Other long term (current) drug therapy: Secondary | ICD-10-CM | POA: Diagnosis not present

## 2018-10-29 DIAGNOSIS — H4043X4 Glaucoma secondary to eye inflammation, bilateral, indeterminate stage: Secondary | ICD-10-CM | POA: Diagnosis not present

## 2018-10-29 DIAGNOSIS — H353131 Nonexudative age-related macular degeneration, bilateral, early dry stage: Secondary | ICD-10-CM | POA: Diagnosis not present

## 2018-11-16 ENCOUNTER — Inpatient Hospital Stay: Payer: Medicare Other | Admitting: Internal Medicine

## 2018-11-16 ENCOUNTER — Other Ambulatory Visit: Payer: Self-pay | Admitting: *Deleted

## 2018-11-16 ENCOUNTER — Inpatient Hospital Stay: Payer: Medicare Other

## 2018-11-16 DIAGNOSIS — D472 Monoclonal gammopathy: Secondary | ICD-10-CM

## 2018-11-16 NOTE — Assessment & Plan Note (Deleted)
#  MGUS-no quantifiable protein noted on protein electrophoresis however poorly-defined band of restricted protein mobility is detected in the gamma globulins. It is unlikely that this may represent a monoclonal protein; however, immunofixation analysis is indicated for further evaluation.  #Patient has chronic kidney disease; mild anemia [fairly steady; not getting any worse]-no evidence of ongoing hypercalcemia-some concern for any multiple myeloma-like disease is small.  I would recommend kappa lambda light chain ratio immunofixation in approximately 6 months.  # Mild Anemia sec to RA.  Hemoglobin around 11.  Stable.  # CKD- III-creatinine 1.2 -1.3.  STABLE.   # RA- on infliximab/mxt SQ [Dr.Kernodle]  # follow up in 6 months; labs-1 week prior [cbc/cmp/iron studies/ferritin; Multiple myeloma panel; K/l light chains]  # Thank you Dr. for allowing me to participate in the care of your pleasant patient. Please do not hesitate to contact me with questions or concerns in the interim.  

## 2018-11-16 NOTE — Progress Notes (Deleted)
[facility] CONSULT NOTE  Patient Care Team: Einar Pheasant, MD as PCP - General (Internal Medicine)  CHIEF COMPLAINTS/PURPOSE OF CONSULTATION: MGUS  # MGUS- SPEP- NEG; but poorly-defined band of restricted protein mobility is detected in the gamma globulins.   #Rheumatoid arthritis [SQ MXT; Humira; Dr.Kernodle]; CKD stage III;   Oncology History   No history exists.   HISTORY OF PRESENTING ILLNESS:  Stacey Snyder 85 y.o.  female long-standing history of rheumatoid arthritis has been referred to [place] for further evaluation of monoclonal protein.  Patient denies any worsening bone pain.  Patient complains of mild to moderate fatigue going on for the last many months.  Denies any worsening tingling or numbness in extremities.  No nausea no vomiting.  No weight loss.   Review of Systems  Constitutional: Positive for malaise/fatigue. Negative for chills, diaphoresis, fever and weight loss.  HENT: Negative for nosebleeds and sore throat.   Eyes: Negative for double vision.  Respiratory: Negative for cough, hemoptysis, sputum production, shortness of breath and wheezing.   Cardiovascular: Negative for chest pain, palpitations, orthopnea and leg swelling.  Gastrointestinal: Negative for abdominal pain, blood in stool, constipation, diarrhea, heartburn, melena, nausea and vomiting.  Genitourinary: Negative for dysuria, frequency and urgency.  Musculoskeletal: Positive for back pain and joint pain.  Skin: Negative.  Negative for itching and rash.  Neurological: Negative for dizziness, tingling, focal weakness, weakness and headaches.  Endo/Heme/Allergies: Does not bruise/bleed easily.  Psychiatric/Behavioral: Negative for depression. The patient is not nervous/anxious and does not have insomnia.      MEDICAL HISTORY:  Past Medical History:  Diagnosis Date  . Diverticulosis   . Fibrocystic breast disease   . GERD (gastroesophageal reflux disease)   . History of  kidney stones   . Hypercholesterolemia   . Hypertension   . Hypertension   . IBS (irritable bowel syndrome)   . Inflammatory arthritis    positive anti-CCP abs, s/p prednisone, MTX, Remicade  . Nephrolithiasis   . Osteopenia    GI upset with Fosamax  . Pancreatitis 2007   s/p ERCP  . Pancreatitis   . PONV (postoperative nausea and vomiting)   . Renal insufficiency   . Spinal stenosis of lumbosacral region     SURGICAL HISTORY: Past Surgical History:  Procedure Laterality Date  . ABDOMINAL HYSTERECTOMY  1993   ovaries not removed  . BREAST BIOPSY Left 1984   negative  . BREAST CYST EXCISION Left 2010   negative  . LITHOTRIPSY    . LUMBAR LAMINECTOMY  1983  . LUMBAR LAMINECTOMY/DECOMPRESSION MICRODISCECTOMY N/A 08/18/2016   Procedure: LUMBAR LAMINECTOMY/DECOMPRESSION MICRODISCECTOMY 1 LEVEL;  Surgeon: Bayard Hugger, MD;  Location: [facility] OR[facility];  Service: Neurosurgery;  Laterality: N/A;  L4-5 Laminectomy, MIS, L5 foraminotomy  . TRIGGER FINGER RELEASE     right ring finger    SOCIAL HISTORY: Social History   Socioeconomic History  . Marital status: Married    Spouse name: Not on file  . Number of children: 4  . Years of education: Not on file  . Highest education level: Not on file  Occupational History  . Not on file  Social Needs  . Financial resource strain: Not on file  . Food insecurity    Worry: Not on file    Inability: Not on file  . Transportation needs    Medical: Not on file    Non-medical: Not on file  Tobacco Use  . Smoking status: Never Smoker  . Smokeless tobacco:  Never Used  Substance and Sexual Activity  . Alcohol use: No    Alcohol/week: 0.0 standard drinks  . Drug use: No  . Sexual activity: Not on file  Lifestyle  . Physical activity    Days per week: Not on file    Minutes per session: Not on file  . Stress: Not on file  Relationships  . Social Herbalist on phone: Not on file    Gets together: Not on file    Attends  religious service: Not on file    Active member of club or organization: Not on file    Attends meetings of clubs or organizations: Not on file    Relationship status: Not on file  . Intimate partner violence    Fear of current or ex partner: Not on file    Emotionally abused: Not on file    Physically abused: Not on file    Forced sexual activity: Not on file  Other Topics Concern  . Not on file  Social History Narrative  . Not on file    FAMILY HISTORY: Family History  Problem Relation Age of Onset  . Ovarian cancer Mother   . Renal Disease Mother   . Heart disease Mother   . Diabetes Mother   . Hypertension Mother   . Breast cancer Neg Hx     ALLERGIES:  is allergic to carisoprodol; codeine; flagyl [metronidazole]; nsaids; skelaxin [metaxalone]; tenormin [atenolol]; and tramadol.  MEDICATIONS:  Current Outpatient Medications  Medication Sig Dispense Refill  . acyclovir (ZOVIRAX) 400 MG tablet Use as directed. 40 tablet 0  . cephALEXin (KEFLEX) 500 MG capsule Take 1 capsule (500 mg total) by mouth 2 (two) times daily. 14 capsule 0  . Cholecalciferol (VITAMIN D) 2000 UNITS tablet Take 2,000 Units by mouth daily.    . clotrimazole-betamethasone (LOTRISONE) cream Apply 1 application topically 2 (two) times daily. 30 g 0  . diltiazem (DILACOR XR) 240 MG 24 hr capsule Take 1 capsule (240 mg total) by mouth daily. 90 capsule 1  . dorzolamide-timolol (COSOPT) 22.3-6.8 MG/ML ophthalmic solution INSTILL 1 DROP INTO EACH EYE TWICE DAILY  5  . fluticasone (FLONASE) 50 MCG/ACT nasal spray USE TWO SPRAY(S) IN EACH NOSTRIL ONCE DAILY 16 g 11  . folic acid (FOLVITE) 1 MG tablet Take 1 mg by mouth 2 (two) times daily.  1  . gabapentin (NEURONTIN) 300 MG capsule Take 1 capsule (300 mg total) by mouth at bedtime. 90 capsule 1  . InFLIXimab (REMICADE IV) Inject into the vein every 6 (six) weeks. Per Dr Jefm Bryant    . latanoprost (XALATAN) 0.005 % ophthalmic solution     . losartan (COZAAR)  100 MG tablet TAKE 1 TABLET DAILY 90 tablet 1  . lovastatin (MEVACOR) 40 MG tablet TAKE 1 TABLET DAILY 90 tablet 1  . methotrexate 25 MG/ML SOLN Inject 17.5 mg into the skin once a week. Patient takes 17.5 mg (0.7 ml) on Monday or Tuesday of each week.    Marland Kitchen omeprazole (PRILOSEC) 20 MG capsule TAKE 1 CAPSULE DAILY 90 capsule 1  . ondansetron (ZOFRAN) 4 MG tablet Take 1 tablet (4 mg total) by mouth every 6 (six) hours as needed for nausea or vomiting. 20 tablet 0  . polyethylene glycol (MIRALAX / GLYCOLAX) packet Take 17 g by mouth daily.    . psyllium (METAMUCIL) 58.6 % packet Take 1 packet by mouth daily.    Marland Kitchen triamterene-hydrochlorothiazide (MAXZIDE-25) 37.5-25 MG tablet TAKE 1 TABLET  DAILY 90 tablet 1   No current facility-administered medications for this visit.       Marland Kitchen  PHYSICAL EXAMINATION: ECOG PERFORMANCE STATUS: 1 - Symptomatic but completely ambulatory  There were no vitals filed for this visit. There were no vitals filed for this visit.  Physical Exam  Constitutional: She is oriented to person, place, and time and well-developed, well-nourished, and in no distress.  HENT:  Head: Normocephalic and atraumatic.  Mouth/Throat: Oropharynx is clear and moist. No oropharyngeal exudate.  Eyes: Pupils are equal, round, and reactive to light.  Neck: Normal range of motion. Neck supple.  Cardiovascular: Normal rate and regular rhythm.  Pulmonary/Chest: No respiratory distress. She has no wheezes.  Abdominal: Soft. Bowel sounds are normal. She exhibits no distension and no mass. There is no abdominal tenderness. There is no rebound and no guarding.  Musculoskeletal: Normal range of motion.        General: No tenderness or edema.  Neurological: She is alert and oriented to person, place, and time.  Skin: Skin is warm.  Psychiatric: Affect normal.     LABORATORY DATA:  I have reviewed the data as listed Lab Results  Component Value Date   WBC 6.4 07/13/2018   HGB 12.1  07/13/2018   HCT 36.3 07/13/2018   MCV 93.6 07/13/2018   PLT 286 07/13/2018   Recent Labs    12/02/17 0943 12/08/17 1100 07/13/18 1037  NA 137 136 132*  K 3.6 3.5 3.4*  CL 100 99 98  CO2 28 28 27   GLUCOSE 93 96 104*  BUN 25* 22 27*  CREATININE 1.30* 1.22* 1.05*  CALCIUM 11.1* 10.3 9.4  GFRNONAA  --   --  49*  GFRAA  --   --  57*  PROT 7.3 6.7 7.5  ALBUMIN 4.3  --  4.2  AST 27  --  24  ALT 19  --  14  ALKPHOS 85  --  83  BILITOT 0.6  --  1.0  BILIDIR 0.1  --   --     RADIOGRAPHIC STUDIES: I have personally reviewed the radiological images as listed and agreed with the findings in the report. No results found.  ASSESSMENT & PLAN:   No problem-specific Assessment & Plan notes found for this encounter.  All questions were answered. The patient knows to call the clinic with any problems, questions or concerns.    Cammie Sickle, MD 11/16/2018 9:38 AM

## 2018-12-03 ENCOUNTER — Ambulatory Visit: Payer: Medicare Other | Admitting: Internal Medicine

## 2018-12-03 ENCOUNTER — Other Ambulatory Visit: Payer: Medicare Other

## 2018-12-06 DIAGNOSIS — M0579 Rheumatoid arthritis with rheumatoid factor of multiple sites without organ or systems involvement: Secondary | ICD-10-CM | POA: Diagnosis not present

## 2018-12-20 ENCOUNTER — Other Ambulatory Visit: Payer: Self-pay | Admitting: Internal Medicine

## 2018-12-20 DIAGNOSIS — E1159 Type 2 diabetes mellitus with other circulatory complications: Secondary | ICD-10-CM

## 2018-12-20 NOTE — Telephone Encounter (Signed)
Please advise 

## 2018-12-20 NOTE — Telephone Encounter (Signed)
Copied from [place] [phone]. Topic: Quick Communication - Rx Refill/Question >> Dec 20, 2018  9:21 AM Parke Poisson wrote: Medication: diltiazem (DILACOR XR) 240 MG 24 hr capsule  Has the patient contacted their pharmacy? No Pt states it is quicker when she calls office and only has a few more pills left  Preferred Pharmacy (with phone number or street name): [facility] [place] [place] [facility] [phone] (Phone) [phone] (Fax)    Agent: Please be advised that RX refills may take up to 3 business days. We ask that you follow-up with your pharmacy.

## 2018-12-21 MED ORDER — DILTIAZEM HCL ER 240 MG PO CP24: 240.0000 mg | ORAL_CAPSULE | Freq: Every day | ORAL | 1 refills | Status: DC

## 2018-12-27 ENCOUNTER — Other Ambulatory Visit: Payer: Self-pay

## 2018-12-27 DIAGNOSIS — E1159 Type 2 diabetes mellitus with other circulatory complications: Secondary | ICD-10-CM

## 2018-12-27 MED ORDER — GABAPENTIN 300 MG PO CAPS: 300.0000 mg | ORAL_CAPSULE | Freq: Every day | ORAL | 1 refills | Status: DC

## 2018-12-27 MED ORDER — DILTIAZEM HCL ER 240 MG PO CP24: 240.0000 mg | ORAL_CAPSULE | Freq: Every day | ORAL | 1 refills | Status: DC

## 2018-12-27 NOTE — Telephone Encounter (Signed)
[facility] and cancelled Diltiazem Rx, per pt's. Request.

## 2018-12-27 NOTE — Addendum Note (Signed)
Addended by: Denman George on: 12/27/2018 01:06 PM   Modules accepted: Orders

## 2018-12-27 NOTE — Telephone Encounter (Signed)
Left message for pt to return call regarding prescription for Diltiazem. Medication was sent to local pharmacy on 12/21/18. Need to confirm if pt would like to pick up the prescription from the local pharmacy due to being out of the medication.

## 2018-12-27 NOTE — Telephone Encounter (Signed)
Call placed to [facility] to cancel Rx for Diltiazem per pt's request.  Was advised by [facility] that there is [facility] pick up available, and pt. Would not have to get out of car, or they could mail it to her.  Called pt.  Explained options for getting her Rx of Diltiazem through [facility].  Stated she prefers to have the order sent to [facility], and does not want to have it filled at [facility].  Also is requesting to have refill of Gabapentin sent to [facility].  Stated I've been on phone all morning to try and get this handled.  Advised will honor her request and send refills to [facility].

## 2018-12-27 NOTE — Telephone Encounter (Signed)
Pt is calling and concerned today because she has been out of medi since Tuesday, she says a request has been sent that Dr Nicki Reaper should have, just worried taking care of Dementia pt with husband. Medication: diltiazem (DILACOR XR) 240 MG 24 hr capsule [facility] [place] [place] [facility] [employer] [phone] (Phone)

## 2018-12-27 NOTE — Telephone Encounter (Signed)
Rx sent in to [facility] per patients request

## 2018-12-27 NOTE — Telephone Encounter (Signed)
Pt aware.

## 2018-12-27 NOTE — Telephone Encounter (Signed)
Pt would like [facility] sent [facility]. Pt stated she didn't want to go into [facility] and wait in line and she would have to take her husband with her. So it would be better to have it sent to other pharmacy / please advise

## 2019-01-15 ENCOUNTER — Other Ambulatory Visit: Payer: Self-pay | Admitting: Internal Medicine

## 2019-01-17 DIAGNOSIS — M25532 Pain in left wrist: Secondary | ICD-10-CM | POA: Diagnosis not present

## 2019-01-17 DIAGNOSIS — M810 Age-related osteoporosis without current pathological fracture: Secondary | ICD-10-CM | POA: Diagnosis not present

## 2019-01-17 DIAGNOSIS — Z79899 Other long term (current) drug therapy: Secondary | ICD-10-CM | POA: Diagnosis not present

## 2019-01-17 DIAGNOSIS — M0579 Rheumatoid arthritis with rheumatoid factor of multiple sites without organ or systems involvement: Secondary | ICD-10-CM | POA: Diagnosis not present

## 2019-01-17 DIAGNOSIS — H5789 Other specified disorders of eye and adnexa: Secondary | ICD-10-CM | POA: Diagnosis not present

## 2019-02-14 ENCOUNTER — Other Ambulatory Visit: Payer: Self-pay | Admitting: Internal Medicine

## 2019-02-14 DIAGNOSIS — K219 Gastro-esophageal reflux disease without esophagitis: Secondary | ICD-10-CM

## 2019-02-16 ENCOUNTER — Other Ambulatory Visit: Payer: Self-pay

## 2019-02-18 ENCOUNTER — Ambulatory Visit (INDEPENDENT_AMBULATORY_CARE_PROVIDER_SITE_OTHER): Payer: Medicare Other | Admitting: Internal Medicine

## 2019-02-18 ENCOUNTER — Other Ambulatory Visit: Payer: Self-pay

## 2019-02-18 VITALS — BP 130/62 | HR 55 | Temp 95.8°F | Resp 16 | Ht 61.0 in | Wt 118.0 lb

## 2019-02-18 DIAGNOSIS — R252 Cramp and spasm: Secondary | ICD-10-CM

## 2019-02-18 DIAGNOSIS — M25511 Pain in right shoulder: Secondary | ICD-10-CM | POA: Diagnosis not present

## 2019-02-18 DIAGNOSIS — K5790 Diverticulosis of intestine, part unspecified, without perforation or abscess without bleeding: Secondary | ICD-10-CM

## 2019-02-18 DIAGNOSIS — R945 Abnormal results of liver function studies: Secondary | ICD-10-CM | POA: Diagnosis not present

## 2019-02-18 DIAGNOSIS — R7989 Other specified abnormal findings of blood chemistry: Secondary | ICD-10-CM

## 2019-02-18 DIAGNOSIS — R21 Rash and other nonspecific skin eruption: Secondary | ICD-10-CM | POA: Diagnosis not present

## 2019-02-18 DIAGNOSIS — I10 Essential (primary) hypertension: Secondary | ICD-10-CM

## 2019-02-18 DIAGNOSIS — K219 Gastro-esophageal reflux disease without esophagitis: Secondary | ICD-10-CM

## 2019-02-18 DIAGNOSIS — R634 Abnormal weight loss: Secondary | ICD-10-CM

## 2019-02-18 DIAGNOSIS — D649 Anemia, unspecified: Secondary | ICD-10-CM

## 2019-02-18 DIAGNOSIS — Z0000 Encounter for general adult medical examination without abnormal findings: Secondary | ICD-10-CM

## 2019-02-18 DIAGNOSIS — M069 Rheumatoid arthritis, unspecified: Secondary | ICD-10-CM | POA: Diagnosis not present

## 2019-02-18 DIAGNOSIS — Z23 Encounter for immunization: Secondary | ICD-10-CM

## 2019-02-18 DIAGNOSIS — L989 Disorder of the skin and subcutaneous tissue, unspecified: Secondary | ICD-10-CM | POA: Diagnosis not present

## 2019-02-18 DIAGNOSIS — E7800 Pure hypercholesterolemia, unspecified: Secondary | ICD-10-CM

## 2019-02-18 LAB — LIPID PANEL
Cholesterol: 169 mg/dL (ref 0–200)
HDL: 65.3 mg/dL (ref >39.00)
LDL Cholesterol: 85 mg/dL (ref 0–99)
NonHDL: 103.93
Total CHOL/HDL Ratio: 3
Triglycerides: 94 mg/dL (ref 0.0–149.0)
VLDL: 18.8 mg/dL (ref 0.0–40.0)

## 2019-02-18 LAB — HEPATIC FUNCTION PANEL
ALT: 11 U/L (ref 0–35)
AST: 23 U/L (ref 0–37)
Albumin: 4.3 g/dL (ref 3.5–5.2)
Alkaline Phosphatase: 89 U/L (ref 39–117)
Bilirubin, Direct: 0.1 mg/dL (ref 0.0–0.3)
Total Bilirubin: 0.5 mg/dL (ref 0.2–1.2)
Total Protein: 7.5 g/dL (ref 6.0–8.3)

## 2019-02-18 LAB — BASIC METABOLIC PANEL
BUN: 30 mg/dL — ABNORMAL HIGH (ref 6–23)
CO2: 29 mEq/L (ref 19–32)
Calcium: 9.7 mg/dL (ref 8.4–10.5)
Chloride: 94 mEq/L — ABNORMAL LOW (ref 96–112)
Creatinine, Ser: 1.3 mg/dL — ABNORMAL HIGH (ref 0.40–1.20)
GFR: 39.04 mL/min — ABNORMAL LOW (ref >60.00)
Glucose, Bld: 99 mg/dL (ref 70–99)
Potassium: 3.6 mEq/L (ref 3.5–5.1)
Sodium: 131 mEq/L — ABNORMAL LOW (ref 135–145)

## 2019-02-18 LAB — CBC WITH DIFFERENTIAL/PLATELET
Basophils Absolute: 0.1 10*3/uL (ref 0.0–0.1)
Basophils Relative: 1.2 % (ref 0.0–3.0)
Eosinophils Absolute: 0.2 10*3/uL (ref 0.0–0.7)
Eosinophils Relative: 2.8 % (ref 0.0–5.0)
HCT: 34.3 % — ABNORMAL LOW (ref 36.0–46.0)
Hemoglobin: 11.6 g/dL — ABNORMAL LOW (ref 12.0–15.0)
Lymphocytes Relative: 41 % (ref 12.0–46.0)
Lymphs Abs: 2.7 10*3/uL (ref 0.7–4.0)
MCHC: 33.7 g/dL (ref 30.0–36.0)
MCV: 94.7 fl (ref 78.0–100.0)
Monocytes Absolute: 0.7 10*3/uL (ref 0.1–1.0)
Monocytes Relative: 10.7 % (ref 3.0–12.0)
Neutro Abs: 2.9 10*3/uL (ref 1.4–7.7)
Neutrophils Relative %: 44.3 % (ref 43.0–77.0)
Platelets: 289 10*3/uL (ref 150.0–400.0)
RBC: 3.63 Mil/uL — ABNORMAL LOW (ref 3.87–5.11)
RDW: 13.4 % (ref 11.5–15.5)
WBC: 6.5 10*3/uL (ref 4.0–10.5)

## 2019-02-18 LAB — MAGNESIUM: Magnesium: 2 mg/dL (ref 1.5–2.5)

## 2019-02-18 LAB — TSH: TSH: 1.48 u[IU]/mL (ref 0.35–4.50)

## 2019-02-18 MED ORDER — TRIAMCINOLONE ACETONIDE 0.1 % EX CREA: 1.0000 "application " | TOPICAL_CREAM | Freq: Two times a day (BID) | CUTANEOUS | 0 refills | Status: DC

## 2019-02-18 NOTE — Progress Notes (Signed)
Patient ID: Stacey Snyder, female   DOB: 23-Jul-1932, 85 y.o.   MRN: [id number]   Subjective:    Patient ID: Stacey Snyder, female    DOB: [birth year], 85 y.o.   MRN: [id number]  HPI  Patient with past history of diverticulosis, GERD, hypertension and hypercholesterolemia.  She comes in today to follow up on these issues as well as for a complete physical exam.  She reports increased stress.  Stress related to her husband's health issues.  Has to do a lot around the house.  Discussed with her today.  She desires no further intervention.  Reports she is eating.  No chest pain.  No sob.  No abdominal pain, nausea or vomiting.  Bowels moving.  No recent GI flares.  Does report some right shoulder pain.  Some limited rom.  Unable to abduct her arm - increased pain.  Discussed further w/up and evaluation.  Discussed xray, etc. She declines.  States has f/u soon with Dr Lavonne Chick.  Also reports increased cramps in her feet and toes.  Noticed recently.  States she is staying hydrated.  Eating.  Did not lose weight from last check.  Also reports rash - forearms.  States she was working in her yard and pulling limbs, weeds, etc.  Rash started after this.     Past Medical History:  Diagnosis Date  . Diverticulosis   . Fibrocystic breast disease   . GERD (gastroesophageal reflux disease)   . History of kidney stones   . Hypercholesterolemia   . Hypertension   . Hypertension   . IBS (irritable bowel syndrome)   . Inflammatory arthritis    positive anti-CCP abs, s/p prednisone, MTX, Remicade  . Nephrolithiasis   . Osteopenia    GI upset with Fosamax  . Pancreatitis 2007   s/p ERCP  . Pancreatitis   . PONV (postoperative nausea and vomiting)   . Renal insufficiency   . Spinal stenosis of lumbosacral region    Past Surgical History:  Procedure Laterality Date  . ABDOMINAL HYSTERECTOMY  1993   ovaries not removed  . BREAST BIOPSY Left 1984   negative  . BREAST CYST EXCISION Left 2010    negative  . LITHOTRIPSY    . LUMBAR LAMINECTOMY  1983  . LUMBAR LAMINECTOMY/DECOMPRESSION MICRODISCECTOMY N/A 08/18/2016   Procedure: LUMBAR LAMINECTOMY/DECOMPRESSION MICRODISCECTOMY 1 LEVEL;  Surgeon: Bayard Hugger, MD;  Location: [facility] ORS;  Service: Neurosurgery;  Laterality: N/A;  L4-5 Laminectomy, MIS, L5 foraminotomy  . TRIGGER FINGER RELEASE     right ring finger   Family History  Problem Relation Age of Onset  . Ovarian cancer Mother   . Renal Disease Mother   . Heart disease Mother   . Diabetes Mother   . Hypertension Mother   . Breast cancer Neg Hx    Social History   Socioeconomic History  . Marital status: Married    Spouse name: Not on file  . Number of children: 4  . Years of education: Not on file  . Highest education level: Not on file  Occupational History  . Not on file  Social Needs  . Financial resource strain: Not on file  . Food insecurity    Worry: Not on file    Inability: Not on file  . Transportation needs    Medical: Not on file    Non-medical: Not on file  Tobacco Use  . Smoking status: Never Smoker  . Smokeless tobacco: Never Used  Substance and Sexual  Activity  . Alcohol use: No    Alcohol/week: 0.0 standard drinks  . Drug use: No  . Sexual activity: Not on file  Lifestyle  . Physical activity    Days per week: Not on file    Minutes per session: Not on file  . Stress: Not on file  Relationships  . Social Herbalist on phone: Not on file    Gets together: Not on file    Attends religious service: Not on file    Active member of club or organization: Not on file    Attends meetings of clubs or organizations: Not on file    Relationship status: Not on file  Other Topics Concern  . Not on file  Social History Narrative  . Not on file    Outpatient Encounter Medications as of 02/18/2019  Medication Sig  . acyclovir (ZOVIRAX) 400 MG tablet Use as directed.  . cephALEXin (KEFLEX) 500 MG capsule Take 1 capsule (500 mg  total) by mouth 2 (two) times daily.  . Cholecalciferol (VITAMIN D) 2000 UNITS tablet Take 2,000 Units by mouth daily.  . clotrimazole-betamethasone (LOTRISONE) cream Apply 1 application topically 2 (two) times daily.  Marland Kitchen diltiazem (DILACOR XR) 240 MG 24 hr capsule Take 1 capsule (240 mg total) by mouth daily.  . dorzolamide-timolol (COSOPT) 22.3-6.8 MG/ML ophthalmic solution INSTILL 1 DROP INTO EACH EYE TWICE DAILY  . fluticasone (FLONASE) 50 MCG/ACT nasal spray USE TWO SPRAY(S) IN EACH NOSTRIL ONCE DAILY  . folic acid (FOLVITE) 1 MG tablet Take 1 mg by mouth 2 (two) times daily.  Marland Kitchen gabapentin (NEURONTIN) 300 MG capsule Take 1 capsule (300 mg total) by mouth at bedtime.  . InFLIXimab (REMICADE IV) Inject into the vein every 6 (six) weeks. Per Dr Jefm Bryant  . latanoprost (XALATAN) 0.005 % ophthalmic solution   . losartan (COZAAR) 100 MG tablet TAKE 1 TABLET DAILY  . lovastatin (MEVACOR) 40 MG tablet TAKE 1 TABLET DAILY  . methotrexate 25 MG/ML SOLN Inject 17.5 mg into the skin once a week. Patient takes 17.5 mg (0.7 ml) on Monday or Tuesday of each week.  Marland Kitchen omeprazole (PRILOSEC) 20 MG capsule TAKE 1 CAPSULE DAILY  . ondansetron (ZOFRAN) 4 MG tablet Take 1 tablet (4 mg total) by mouth every 6 (six) hours as needed for nausea or vomiting.  . polyethylene glycol (MIRALAX / GLYCOLAX) packet Take 17 g by mouth daily.  . psyllium (METAMUCIL) 58.6 % packet Take 1 packet by mouth daily.  Marland Kitchen triamcinolone cream (KENALOG) 0.1 % Apply 1 application topically 2 (two) times daily.  Marland Kitchen triamterene-hydrochlorothiazide (MAXZIDE-25) 37.5-25 MG tablet TAKE 1 TABLET DAILY   No facility-administered encounter medications on file as of 02/18/2019.    Review of Systems  Constitutional: Negative for appetite change and unexpected weight change.  HENT: Negative for congestion and sinus pressure.   Eyes: Negative for pain and visual disturbance.  Respiratory: Negative for cough, chest tightness and shortness of breath.    Cardiovascular: Negative for chest pain, palpitations and leg swelling.  Gastrointestinal: Negative for abdominal pain, diarrhea, nausea and vomiting.  Genitourinary: Negative for difficulty urinating and dysuria.  Musculoskeletal: Negative for joint swelling and myalgias.       Right shoulder pain.    Skin: Positive for rash. Negative for color change.  Neurological: Negative for dizziness, light-headedness and headaches.  Hematological: Negative for adenopathy. Does not bruise/bleed easily.  Psychiatric/Behavioral: Negative for agitation and dysphoric mood.       Objective:  Physical Exam Constitutional:      General: She is not in acute distress.    Appearance: Normal appearance. She is well-developed.  HENT:     Head: Normocephalic and atraumatic.     Right Ear: External ear normal.     Left Ear: External ear normal.  Eyes:     General: No scleral icterus.       Right eye: No discharge.        Left eye: No discharge.     Conjunctiva/sclera: Conjunctivae normal.  Neck:     Musculoskeletal: Neck supple. No muscular tenderness.     Thyroid: No thyromegaly.  Cardiovascular:     Rate and Rhythm: Normal rate and regular rhythm.  Pulmonary:     Effort: No tachypnea, accessory muscle usage or respiratory distress.     Breath sounds: Normal breath sounds. No decreased breath sounds or wheezing.  Chest:     Breasts:        Right: No inverted nipple, mass, nipple discharge or tenderness (no axillary adenopathy).        Left: No inverted nipple, mass, nipple discharge or tenderness (no axilarry adenopathy).  Abdominal:     General: Bowel sounds are normal.     Palpations: Abdomen is soft.     Tenderness: There is no abdominal tenderness.  Musculoskeletal:        General: No swelling or tenderness.  Lymphadenopathy:     Cervical: No cervical adenopathy.  Skin:    Findings: No erythema.     Comments: Erythematous based rash - forearms.    Neurological:     Mental Status:  She is alert and oriented to person, place, and time.  Psychiatric:        Mood and Affect: Mood normal.        Behavior: Behavior normal.     BP 130/62   Pulse (!) 55   Temp (!) 95.8 F (35.4 C)   Resp 16   Ht 5\' 1"  (1.549 m)   Wt 118 lb (53.5 kg)   SpO2 98%   BMI 22.30 kg/m  Wt Readings from Last 3 Encounters:  02/18/19 118 lb (53.5 kg)  10/14/18 115 lb (52.2 kg)  05/21/18 115 lb (52.2 kg)     Lab Results  Component Value Date   WBC 6.5 02/18/2019   HGB 11.6 (L) 02/18/2019   HCT 34.3 (L) 02/18/2019   PLT 289.0 02/18/2019   GLUCOSE 99 02/18/2019   CHOL 169 02/18/2019   TRIG 94.0 02/18/2019   HDL 65.30 02/18/2019   LDLCALC 85 02/18/2019   ALT 11 02/18/2019   AST 23 02/18/2019   NA 131 (L) 02/18/2019   K 3.6 02/18/2019   CL 94 (L) 02/18/2019   CREATININE 1.30 (H) 02/18/2019   BUN 30 (H) 02/18/2019   CO2 29 02/18/2019   TSH 1.48 02/18/2019   INR 0.92 08/07/2016       Assessment & Plan:   Problem List Items Addressed This Visit    Abnormal liver function tests    Follow liver panel.       Anemia - Primary    Check cbc.        Cramps, extremity    Increased muscle cramps as outlined.  Discussed stretches.  Check routine labs including magnesium and electrolytes.        Relevant Orders   CBC with Differential/Platelet (Completed)   Basic metabolic panel (Completed)   Magnesium (Completed)   Diverticulosis    No  recent flare.        GERD (gastroesophageal reflux disease)    Controlled on prilosec.        Health care maintenance    Physical today 02/18/19.  Declines mammogram.  Colonoscopy 2013.  Followed by GI.  Will pursue f/u bone density after current issues sorted through       Hypercholesterolemia    On lovastatin.  Follow lipid panel and liver function tests.        Relevant Orders   Hepatic function panel (Completed)   Lipid panel (Completed)   Hypertension    Blood pressure under good control.  Continue same medication regimen.   Follow pressures.  Follow metabolic panel.        Relevant Orders   TSH (Completed)   Loss of weight    Weight stable (actually increased) from last check.        Rash    States started after working out in her yard.  Triamcinolone cream as directed.  Also has some skin lesions that she would like dermatology evaluation.  Refer to dermatology.        Rheumatoid arthritis ([place])    Followed by Dr Jefm Bryant.  Received remicade infusions.  Has f/u soon with Dr Jefm Bryant.        Right shoulder pain    Right shoulder pain and limited rom as outlined.  Increased pain with attempts at abduction.  Discussed further w/up.  She declines.  States has f/u with Dr Jefm Bryant soon and wants to hold on any further intervention at this time.        Skin lesion    Persistent skin lesions - refer to dermatology.       Relevant Orders   Ambulatory referral to Dermatology       Einar Pheasant, MD

## 2019-02-18 NOTE — Assessment & Plan Note (Addendum)
Physical today 02/18/19.  Declines mammogram.  Colonoscopy 2013.  Followed by GI.  Will pursue f/u bone density after current issues sorted through

## 2019-02-19 ENCOUNTER — Encounter: Payer: Self-pay | Admitting: Internal Medicine

## 2019-02-19 ENCOUNTER — Other Ambulatory Visit: Payer: Self-pay | Admitting: Internal Medicine

## 2019-02-19 DIAGNOSIS — R21 Rash and other nonspecific skin eruption: Secondary | ICD-10-CM | POA: Insufficient documentation

## 2019-02-19 DIAGNOSIS — R7989 Other specified abnormal findings of blood chemistry: Secondary | ICD-10-CM

## 2019-02-19 DIAGNOSIS — E871 Hypo-osmolality and hyponatremia: Secondary | ICD-10-CM

## 2019-02-19 DIAGNOSIS — M25511 Pain in right shoulder: Secondary | ICD-10-CM | POA: Insufficient documentation

## 2019-02-19 NOTE — Assessment & Plan Note (Signed)
Follow liver panel.  

## 2019-02-19 NOTE — Assessment & Plan Note (Signed)
Weight stable (actually increased) from last check.

## 2019-02-19 NOTE — Assessment & Plan Note (Signed)
Controlled on prilosec.   

## 2019-02-19 NOTE — Assessment & Plan Note (Signed)
Persistent skin lesions - refer to dermatology.

## 2019-02-19 NOTE — Progress Notes (Signed)
Order placed for f/u met b.  

## 2019-02-19 NOTE — Assessment & Plan Note (Signed)
States started after working out in her yard.  Triamcinolone cream as directed.  Also has some skin lesions that she would like dermatology evaluation.  Refer to dermatology.

## 2019-02-19 NOTE — Assessment & Plan Note (Signed)
Check cbc 

## 2019-02-19 NOTE — Assessment & Plan Note (Signed)
Increased muscle cramps as outlined.  Discussed stretches.  Check routine labs including magnesium and electrolytes.

## 2019-02-19 NOTE — Assessment & Plan Note (Signed)
No recent flare.  

## 2019-02-19 NOTE — Assessment & Plan Note (Signed)
Followed by Dr Jefm Bryant.  Received remicade infusions.  Has f/u soon with Dr Jefm Bryant.

## 2019-02-19 NOTE — Assessment & Plan Note (Signed)
On lovastatin.  Follow lipid panel and liver function tests.   

## 2019-02-19 NOTE — Assessment & Plan Note (Signed)
Right shoulder pain and limited rom as outlined.  Increased pain with attempts at abduction.  Discussed further w/up.  She declines.  States has f/u with Dr Jefm Bryant soon and wants to hold on any further intervention at this time.

## 2019-02-19 NOTE — Assessment & Plan Note (Signed)
Blood pressure under good control.  Continue same medication regimen.  Follow pressures.  Follow metabolic panel.   

## 2019-03-07 DIAGNOSIS — M810 Age-related osteoporosis without current pathological fracture: Secondary | ICD-10-CM | POA: Diagnosis not present

## 2019-03-07 DIAGNOSIS — M0579 Rheumatoid arthritis with rheumatoid factor of multiple sites without organ or systems involvement: Secondary | ICD-10-CM | POA: Diagnosis not present

## 2019-03-07 DIAGNOSIS — Z79899 Other long term (current) drug therapy: Secondary | ICD-10-CM | POA: Diagnosis not present

## 2019-03-07 LAB — HM DEXA SCAN

## 2019-03-11 ENCOUNTER — Other Ambulatory Visit: Payer: Self-pay

## 2019-03-11 ENCOUNTER — Ambulatory Visit (INDEPENDENT_AMBULATORY_CARE_PROVIDER_SITE_OTHER): Payer: Medicare Other | Admitting: Internal Medicine

## 2019-03-11 ENCOUNTER — Telehealth: Payer: Self-pay | Admitting: Internal Medicine

## 2019-03-11 ENCOUNTER — Other Ambulatory Visit (INDEPENDENT_AMBULATORY_CARE_PROVIDER_SITE_OTHER): Payer: Medicare Other

## 2019-03-11 DIAGNOSIS — R3915 Urgency of urination: Secondary | ICD-10-CM

## 2019-03-11 DIAGNOSIS — E871 Hypo-osmolality and hyponatremia: Secondary | ICD-10-CM

## 2019-03-11 DIAGNOSIS — R300 Dysuria: Secondary | ICD-10-CM

## 2019-03-11 DIAGNOSIS — N3000 Acute cystitis without hematuria: Secondary | ICD-10-CM

## 2019-03-11 DIAGNOSIS — R7989 Other specified abnormal findings of blood chemistry: Secondary | ICD-10-CM

## 2019-03-11 LAB — URINALYSIS, ROUTINE W REFLEX MICROSCOPIC
Bilirubin Urine: NEGATIVE
Ketones, ur: NEGATIVE
Nitrite: NEGATIVE
Specific Gravity, Urine: 1.01 (ref 1.000–1.030)
Total Protein, Urine: NEGATIVE
Urine Glucose: NEGATIVE
Urobilinogen, UA: 0.2 (ref 0.0–1.0)
pH: 7.5 (ref 5.0–8.0)

## 2019-03-11 LAB — BASIC METABOLIC PANEL
BUN: 27 mg/dL — ABNORMAL HIGH (ref 6–23)
CO2: 30 mEq/L (ref 19–32)
Calcium: 9.5 mg/dL (ref 8.4–10.5)
Chloride: 95 mEq/L — ABNORMAL LOW (ref 96–112)
Creatinine, Ser: 1.07 mg/dL (ref 0.40–1.20)
GFR: 48.86 mL/min — ABNORMAL LOW (ref >60.00)
Glucose, Bld: 82 mg/dL (ref 70–99)
Potassium: 3.3 mEq/L — ABNORMAL LOW (ref 3.5–5.1)
Sodium: 132 mEq/L — ABNORMAL LOW (ref 135–145)

## 2019-03-11 MED ORDER — ALIGN 4 MG PO CAPS: ORAL_CAPSULE | ORAL | 0 refills | Status: AC

## 2019-03-11 MED ORDER — CEFDINIR 300 MG PO CAPS: 300.0000 mg | ORAL_CAPSULE | Freq: Two times a day (BID) | ORAL | 0 refills | Status: DC

## 2019-03-11 NOTE — Telephone Encounter (Signed)
Pt has appt for labs today at 10:45am. She expressed complaints of burning when going to the bathroom and little output. She thinks she may have a UTI and would like to know if Dr. Nicki Reaper can put in an order for her to have urinalysis so she can give a sample while she is here today. Please advise.

## 2019-03-11 NOTE — Addendum Note (Signed)
Addended by: Leeanne Rio on: 03/11/2019 12:28 PM   Modules accepted: Orders

## 2019-03-11 NOTE — Telephone Encounter (Signed)
Pt mentioned this again during her lab visit since nobody has called her back. She states that other than the burning, she has urgency & feels chills up her arms after urinating. Sx's started two days ago.   Pt left a urine specimen during her lab visit. I advised her that someone would call her back today.

## 2019-03-11 NOTE — Telephone Encounter (Signed)
Symptoms listed below. Pt scheduled for telephone visit at 5.

## 2019-03-11 NOTE — Progress Notes (Signed)
Patient ID: Stacey Snyder, female   DOB: [birth year], 85 y.o.   MRN: [id number]   Virtual Visit via telephone Note  This visit type was conducted due to national recommendations for restrictions regarding the COVID-19 pandemic (e.g. social distancing).  This format is felt to be most appropriate for this patient at this time.  All issues noted in this document were discussed and addressed.  No physical exam was performed (except for noted visual exam findings with Video Visits).   I connected with Jalqueline Arseneault by telephone and verified that I am speaking with the correct person using two identifiers. Location patient: home Location provider: work  Persons participating in the telephone visit: patient, provider  I discussed the limitations, risks, security and privacy concerns of performing an evaluation and management service by telephone and the availability of in person appointments.  The patient expressed understanding and agreed to proceed.   Reason for visit: work in appt  HPI: She reports she first noticed symptoms approximately two weeks ago.  Drank soda water and symptoms improved.  Flared again several days ago.  Last night symptoms increased.  Some chills.  Some dysuria.  No hematuria.  Some fatigue.  Some back discomfort.  No vomiting.  No nausea.  No documented fever.  Is eating and drinking.  No vaginal symptoms and no bowel change.     ROS: See pertinent positives and negatives per HPI.  Past Medical History:  Diagnosis Date  . Diverticulosis   . Fibrocystic breast disease   . GERD (gastroesophageal reflux disease)   . History of kidney stones   . Hypercholesterolemia   . Hypertension   . Hypertension   . IBS (irritable bowel syndrome)   . Inflammatory arthritis    positive anti-CCP abs, s/p prednisone, MTX, Remicade  . Nephrolithiasis   . Osteopenia    GI upset with Fosamax  . Pancreatitis 2007   s/p ERCP  . Pancreatitis   . PONV (postoperative  nausea and vomiting)   . Renal insufficiency   . Spinal stenosis of lumbosacral region     Past Surgical History:  Procedure Laterality Date  . ABDOMINAL HYSTERECTOMY  1993   ovaries not removed  . BREAST BIOPSY Left 1984   negative  . BREAST CYST EXCISION Left 2010   negative  . LITHOTRIPSY    . LUMBAR LAMINECTOMY  1983  . LUMBAR LAMINECTOMY/DECOMPRESSION MICRODISCECTOMY N/A 08/18/2016   Procedure: LUMBAR LAMINECTOMY/DECOMPRESSION MICRODISCECTOMY 1 LEVEL;  Surgeon: Bayard Hugger, MD;  Location: [facility] ORS;  Service: Neurosurgery;  Laterality: N/A;  L4-5 Laminectomy, MIS, L5 foraminotomy  . TRIGGER FINGER RELEASE     right ring finger    Family History  Problem Relation Age of Onset  . Ovarian cancer Mother   . Renal Disease Mother   . Heart disease Mother   . Diabetes Mother   . Hypertension Mother   . Breast cancer Neg Hx     SOCIAL HX: reviewed.    Current Outpatient Medications:  .  acyclovir (ZOVIRAX) 400 MG tablet, Use as directed., Disp: 40 tablet, Rfl: 0 .  cefdinir (OMNICEF) 300 MG capsule, Take 1 capsule (300 mg total) by mouth 2 (two) times daily., Disp: 10 capsule, Rfl: 0 .  Cholecalciferol (VITAMIN D) 2000 UNITS tablet, Take 2,000 Units by mouth daily., Disp: , Rfl:  .  clotrimazole-betamethasone (LOTRISONE) cream, Apply 1 application topically 2 (two) times daily., Disp: 30 g, Rfl: 0 .  diltiazem (DILACOR XR) 240 MG 24 hr capsule,  Take 1 capsule (240 mg total) by mouth daily., Disp: 90 capsule, Rfl: 1 .  dorzolamide-timolol (COSOPT) 22.3-6.8 MG/ML ophthalmic solution, INSTILL 1 DROP INTO EACH EYE TWICE DAILY, Disp: , Rfl: 5 .  fluticasone (FLONASE) 50 MCG/ACT nasal spray, USE TWO SPRAY(S) IN EACH NOSTRIL ONCE DAILY, Disp: 16 g, Rfl: 11 .  folic acid (FOLVITE) 1 MG tablet, Take 1 mg by mouth 2 (two) times daily., Disp: , Rfl: 1 .  gabapentin (NEURONTIN) 300 MG capsule, Take 1 capsule (300 mg total) by mouth at bedtime., Disp: 90 capsule, Rfl: 1 .  InFLIXimab  (REMICADE IV), Inject into the vein every 6 (six) weeks. Per Dr Jefm Bryant, Disp: , Rfl:  .  latanoprost (XALATAN) 0.005 % ophthalmic solution, , Disp: , Rfl:  .  losartan (COZAAR) 100 MG tablet, TAKE 1 TABLET DAILY, Disp: 90 tablet, Rfl: 1 .  lovastatin (MEVACOR) 40 MG tablet, TAKE 1 TABLET DAILY, Disp: 90 tablet, Rfl: 1 .  methotrexate 25 MG/ML SOLN, Inject 17.5 mg into the skin once a week. Patient takes 17.5 mg (0.7 ml) on Monday or Tuesday of each week., Disp: , Rfl:  .  omeprazole (PRILOSEC) 20 MG capsule, TAKE 1 CAPSULE DAILY, Disp: 90 capsule, Rfl: 1 .  ondansetron (ZOFRAN) 4 MG tablet, Take 1 tablet (4 mg total) by mouth every 6 (six) hours as needed for nausea or vomiting., Disp: 20 tablet, Rfl: 0 .  polyethylene glycol (MIRALAX / GLYCOLAX) packet, Take 17 g by mouth daily., Disp: , Rfl:  .  Probiotic Product (ALIGN) 4 MG CAPS, Take one capsule daily, Disp: 30 capsule, Rfl: 0 .  psyllium (METAMUCIL) 58.6 % packet, Take 1 packet by mouth daily., Disp: , Rfl:  .  triamcinolone cream (KENALOG) 0.1 %, Apply 1 application topically 2 (two) times daily., Disp: 30 g, Rfl: 0 .  triamterene-hydrochlorothiazide (MAXZIDE-25) 37.5-25 MG tablet, TAKE 1 TABLET DAILY, Disp: 90 tablet, Rfl: 1  EXAM:  GENERAL: alert.  Sounds to be in no acute distress. Answering questions appropriately.    PSYCH/NEURO: pleasant and cooperative, no obvious depression or anxiety, speech and thought processing grossly intact  ASSESSMENT AND PLAN:  Discussed the following assessment and plan:  UTI (urinary tract infection) Symptoms as outlined.  Urinalysis reviewed.  Appears to be c/w UTI.  Treat with omnicef as directed.  Probiotic as directed.  Follow closely.  Will notify of urine culture results once available.      I discussed the assessment and treatment plan with the patient. The patient was provided an opportunity to ask questions and all were answered. The patient agreed with the plan and demonstrated an  understanding of the instructions.   The patient was advised to call back or seek an in-person evaluation if the symptoms worsen or if the condition fails to improve as anticipated.  I provided 15 minutes of non-face-to-face time during this encounter.   Einar Pheasant, MD

## 2019-03-11 NOTE — Telephone Encounter (Signed)
Please call pt and get symptoms.  Doxy appt.    I am ok to run a urine, please place orders.

## 2019-03-12 ENCOUNTER — Other Ambulatory Visit: Payer: Self-pay | Admitting: Internal Medicine

## 2019-03-12 DIAGNOSIS — E876 Hypokalemia: Secondary | ICD-10-CM

## 2019-03-12 NOTE — Progress Notes (Signed)
Order placed for f/u potassium check.  

## 2019-03-13 LAB — URINE CULTURE

## 2019-03-14 ENCOUNTER — Other Ambulatory Visit: Payer: Medicare Other

## 2019-03-14 ENCOUNTER — Encounter: Payer: Self-pay | Admitting: Internal Medicine

## 2019-03-14 NOTE — Assessment & Plan Note (Signed)
Symptoms as outlined.  Urinalysis reviewed.  Appears to be c/w UTI.  Treat with omnicef as directed.  Probiotic as directed.  Follow closely.  Will notify of urine culture results once available.

## 2019-03-28 ENCOUNTER — Other Ambulatory Visit: Payer: Self-pay

## 2019-03-29 ENCOUNTER — Other Ambulatory Visit (INDEPENDENT_AMBULATORY_CARE_PROVIDER_SITE_OTHER): Payer: Medicare Other

## 2019-03-29 ENCOUNTER — Other Ambulatory Visit: Payer: Self-pay

## 2019-03-29 DIAGNOSIS — E876 Hypokalemia: Secondary | ICD-10-CM

## 2019-03-29 LAB — POTASSIUM: Potassium: 3.7 mEq/L (ref 3.5–5.1)

## 2019-04-08 DIAGNOSIS — K219 Gastro-esophageal reflux disease without esophagitis: Secondary | ICD-10-CM | POA: Diagnosis not present

## 2019-04-08 DIAGNOSIS — M810 Age-related osteoporosis without current pathological fracture: Secondary | ICD-10-CM | POA: Diagnosis not present

## 2019-04-14 DIAGNOSIS — L281 Prurigo nodularis: Secondary | ICD-10-CM | POA: Diagnosis not present

## 2019-04-14 DIAGNOSIS — L300 Nummular dermatitis: Secondary | ICD-10-CM | POA: Diagnosis not present

## 2019-04-30 ENCOUNTER — Other Ambulatory Visit: Payer: Self-pay | Admitting: Internal Medicine

## 2019-06-02 DIAGNOSIS — M810 Age-related osteoporosis without current pathological fracture: Secondary | ICD-10-CM | POA: Diagnosis not present

## 2019-06-18 ENCOUNTER — Other Ambulatory Visit: Payer: Self-pay

## 2019-06-18 ENCOUNTER — Ambulatory Visit
Admission: EM | Admit: 2019-06-18 | Discharge: 2019-06-18 | Disposition: A | Discharge status: Home / Self Care | Payer: Medicare Other | Attending: Family Medicine | Admitting: Family Medicine

## 2019-06-18 DIAGNOSIS — S61411A Laceration without foreign body of right hand, initial encounter: Secondary | ICD-10-CM

## 2019-06-18 DIAGNOSIS — W208XXA Other cause of strike by thrown, projected or falling object, initial encounter: Secondary | ICD-10-CM | POA: Diagnosis not present

## 2019-06-18 NOTE — ED Triage Notes (Signed)
Pt presents with c/o right hand laceration after washing machine lid fell and closed on her hand this morning. Pt has lacerated area to the top of her right hand along with some bruising.

## 2019-06-18 NOTE — ED Provider Notes (Signed)
[facility]    CSN: [id number] Arrival date & time: 06/18/19  0935      History   Chief Complaint Chief Complaint  Patient presents with  . Extremity Laceration    right hand   HPI  86 year old female presents with a laceration to the dorsum of her right hand.  Patient states that she was putting close in the washing machine and the lid fell directly on her hand and she suffered a laceration.  She has curvilinear laceration to the dorsum of the right hand.  Mild bleeding.  Pain 8/10 in severity.  Patient believes that she will need stitches.  No relieving factors.  No other injuries.  No other complaints or concerns at this time.  Past Medical History:  Diagnosis Date  . Diverticulosis   . Fibrocystic breast disease   . GERD (gastroesophageal reflux disease)   . History of kidney stones   . Hypercholesterolemia   . Hypertension   . Hypertension   . IBS (irritable bowel syndrome)   . Inflammatory arthritis    positive anti-CCP abs, s/p prednisone, MTX, Remicade  . Nephrolithiasis   . Osteopenia    GI upset with Fosamax  . Pancreatitis 2007   s/p ERCP  . Pancreatitis   . PONV (postoperative nausea and vomiting)   . Renal insufficiency   . Spinal stenosis of lumbosacral region     Patient Active Problem List   Diagnosis Date Noted  . Right shoulder pain 02/19/2019  . Rash 02/19/2019  . MGUS (monoclonal gammopathy of unknown significance) 01/19/2018  . Cramps, extremity 12/02/2017  . Abdominal bruit 01/16/2017  . Lumbar stenosis 08/18/2016  . Abdominal pain 12/03/2015  . Abnormal liver function tests 10/18/2015  . Anemia 10/18/2015  . Hyponatremia 10/07/2015  . Lip lesion 04/23/2015  . Scalp lesion 04/23/2015  . Loss of weight 04/23/2015  . Stress 04/23/2015  . Left shoulder pain 12/20/2014  . UTI (urinary tract infection) 08/20/2014  . Health care maintenance 08/20/2014  . Skin lesion 04/17/2014  . Fatigue 04/13/2014  . Shoulder pain, right  12/17/2013  . Trigger finger 08/21/2013  . Left hip pain 04/17/2013  . GERD (gastroesophageal reflux disease) 04/03/2012  . Rheumatoid arthritis ([place]) 03/30/2012  . Diverticulosis 03/30/2012  . Osteopenia 03/30/2012  . Hypertension 03/30/2012  . Hypercholesterolemia 03/30/2012    Past Surgical History:  Procedure Laterality Date  . ABDOMINAL HYSTERECTOMY  1993   ovaries not removed  . BREAST BIOPSY Left 1984   negative  . BREAST CYST EXCISION Left 2010   negative  . LITHOTRIPSY    . LUMBAR LAMINECTOMY  1983  . LUMBAR LAMINECTOMY/DECOMPRESSION MICRODISCECTOMY N/A 08/18/2016   Procedure: LUMBAR LAMINECTOMY/DECOMPRESSION MICRODISCECTOMY 1 LEVEL;  Surgeon: Bayard Hugger, MD;  Location: [facility] ORS;  Service: Neurosurgery;  Laterality: N/A;  L4-5 Laminectomy, MIS, L5 foraminotomy  . TRIGGER FINGER RELEASE     right ring finger    OB History   No obstetric history on file.      Home Medications    Prior to Admission medications   Medication Sig Start Date End Date Taking? Authorizing Provider  Cholecalciferol (VITAMIN D) 2000 UNITS tablet Take 2,000 Units by mouth daily.   Yes [provider]  clotrimazole-betamethasone (LOTRISONE) cream Apply 1 application topically 2 (two) times daily. 05/21/18  Yes Einar Pheasant, MD  diltiazem (DILACOR XR) 240 MG 24 hr capsule Take 1 capsule (240 mg total) by mouth daily. 12/27/18  Yes Einar Pheasant, MD  dorzolamide-timolol (COSOPT)  22.3-6.8 MG/ML ophthalmic solution INSTILL 1 DROP INTO EACH EYE TWICE DAILY 12/17/17  Yes [provider]  fluticasone (FLONASE) 50 MCG/ACT nasal spray USE TWO SPRAY(S) IN EACH NOSTRIL ONCE DAILY 01/14/17  Yes Einar Pheasant, MD  folic acid (FOLVITE) 1 MG tablet Take 1 mg by mouth 2 (two) times daily. 12/14/17  Yes [provider]  gabapentin (NEURONTIN) 300 MG capsule Take 1 capsule (300 mg total) by mouth at bedtime. 12/27/18  Yes Einar Pheasant, MD  InFLIXimab (REMICADE IV) Inject into the  vein every 6 (six) weeks. Per Dr Jefm Bryant   Yes [provider]  latanoprost (XALATAN) 0.005 % ophthalmic solution  01/18/18  Yes [provider]  losartan (COZAAR) 100 MG tablet TAKE 1 TABLET DAILY 05/02/19  Yes Einar Pheasant, MD  lovastatin (MEVACOR) 40 MG tablet TAKE 1 TABLET DAILY 09/21/18  Yes Crecencio Mc, MD  methotrexate 25 MG/ML SOLN Inject 17.5 mg into the skin once a week. Patient takes 17.5 mg (0.7 ml) on Monday or Tuesday of each week.   Yes [provider]  omeprazole (PRILOSEC) 20 MG capsule TAKE 1 CAPSULE DAILY 02/14/19  Yes Einar Pheasant, MD  ondansetron (ZOFRAN) 4 MG tablet Take 1 tablet (4 mg total) by mouth every 6 (six) hours as needed for nausea or vomiting. 08/19/16  Yes Bayard Hugger, MD  polyethylene glycol (MIRALAX / GLYCOLAX) packet Take 17 g by mouth daily.   Yes [provider]  Probiotic Product (ALIGN) 4 MG CAPS Take one capsule daily 03/11/19  Yes Einar Pheasant, MD  psyllium (METAMUCIL) 58.6 % packet Take 1 packet by mouth daily.   Yes [provider]  triamcinolone cream (KENALOG) 0.1 % Apply 1 application topically 2 (two) times daily. 02/18/19  Yes Einar Pheasant, MD  triamterene-hydrochlorothiazide (MAXZIDE-25) 37.5-25 MG tablet TAKE 1 TABLET DAILY 01/17/19  Yes Einar Pheasant, MD  zoledronic acid (RECLAST) 5 MG/100ML SOLN injection Inject 5 mg into the vein once.   Yes [provider]    Family History Family History  Problem Relation Age of Onset  . Ovarian cancer Mother   . Renal Disease Mother   . Heart disease Mother   . Diabetes Mother   . Hypertension Mother   . Breast cancer Neg Hx     Social History Social History   Tobacco Use  . Smoking status: Never Smoker  . Smokeless tobacco: Never Used  Substance Use Topics  . Alcohol use: No    Alcohol/week: 0.0 standard drinks  . Drug use: No     Allergies   Carisoprodol, Codeine, Flagyl [metronidazole], Nsaids, Skelaxin  [metaxalone], Tenormin [atenolol], and Tramadol   Review of Systems Review of Systems  Constitutional: Negative.   Skin: Positive for wound.   Physical Exam Triage Vital Signs ED Triage Vitals  Enc Vitals Group     BP 06/18/19 0950 (!) 137/101     Pulse Rate 06/18/19 0950 61     Resp --      Temp 06/18/19 0950 98.6 F (37 C)     Temp Source 06/18/19 0950 Oral     SpO2 06/18/19 0950 99 %     Weight 06/18/19 0946 114 lb (51.7 kg)     Height 06/18/19 0946 5' 1.5" (1.562 m)     Head Circumference --      Peak Flow --      Pain Score 06/18/19 0945 8     Pain Loc --      Pain Edu? --  Excl. in [place]? --    Updated Vital Signs BP (!) 137/101 (BP Location: Left Arm)   Pulse 61   Temp 98.6 F (37 C) (Oral)   Ht 5' 1.5" (1.562 m)   Wt 51.7 kg   SpO2 99%   BMI 21.19 kg/m   Visual Acuity Right Eye Distance:   Left Eye Distance:   Bilateral Distance:    Right Eye Near:   Left Eye Near:    Bilateral Near:     Physical Exam Vitals and nursing note reviewed.  Constitutional:      General: She is not in acute distress.    Appearance: Normal appearance. She is not ill-appearing.  HENT:     Head: Normocephalic and atraumatic.  Eyes:     General:        Right eye: No discharge.        Left eye: No discharge.     Conjunctiva/sclera: Conjunctivae normal.  Pulmonary:     Effort: Pulmonary effort is normal. No respiratory distress.  Skin:    Comments: Right hand -dorsum of the right hand with a 3 cm curvilinear laceration.  Minimal bleeding.  Neurological:     Mental Status: She is alert.  Psychiatric:        Mood and Affect: Mood normal.        Behavior: Behavior normal.    UC Treatments / Results  Labs (all labs ordered are listed, but only abnormal results are displayed) Labs Reviewed - No data to display  EKG   Radiology No results found.  Procedures Laceration Repair  Date/Time: 06/18/2019 10:38 AM Performed by: Coral Spikes, DO Authorized by:  Coral Spikes, DO   Consent:    Consent obtained:  Verbal   Consent given by:  Parent and patient Anesthesia (see MAR for exact dosages):    Anesthesia method:  Local infiltration   Local anesthetic:  Lidocaine 1% WITH epi Laceration details:    Location:  Hand   Hand location:  R hand, dorsum   Length (cm):  3 Repair type:    Repair type:  Simple Pre-procedure details:    Preparation:  Patient was prepped and draped in usual sterile fashion Exploration:    Hemostasis achieved with:  Epinephrine and direct pressure   Contaminated: no   Treatment:    Area cleansed with:  Betadine   Amount of cleaning:  Standard Skin repair:    Repair method:  Sutures   Suture size:  5-0   Suture material:  Nylon   Suture technique:  Simple interrupted   Number of sutures:  4 Approximation:    Approximation:  Close Post-procedure details:    Dressing:  Antibiotic ointment and non-adherent dressing   Patient tolerance of procedure:  Tolerated well, no immediate complications   (including critical care time)  Medications Ordered in UC Medications - No data to display  Initial Impression / Assessment and Plan / UC Course  I have reviewed the triage vital signs and the nursing notes.  Pertinent labs & imaging results that were available during my care of the patient were reviewed by me and considered in my medical decision making (see chart for details).    86 year old female presents with a laceration.  Repaired as above.  Sutures out in 7 days.  Supportive care.  Final Clinical Impressions(s) / UC Diagnoses   Final diagnoses:  Laceration of right hand without foreign body, initial encounter     Discharge Instructions  Keep clean.  Do not submerge in water.  Sutures out in 7 days.   Take care  Dr. Lacinda Axon    ED Prescriptions    None     PDMP not reviewed this encounter.   Coral Spikes, [place] 06/18/19 1039

## 2019-06-18 NOTE — Discharge Instructions (Signed)
Keep clean.  Do not submerge in water.  Sutures out in 7 days.   Take care  Dr. Lacinda Axon

## 2019-06-19 ENCOUNTER — Ambulatory Visit: Payer: Medicare Other | Attending: Internal Medicine

## 2019-06-19 DIAGNOSIS — Z23 Encounter for immunization: Secondary | ICD-10-CM

## 2019-06-19 NOTE — Progress Notes (Signed)
   Covid-19 Vaccination Clinic  Name:  Stacey Snyder    MRN: [id number] DOB: May 24, 1932  06/19/2019  [name] was observed post Covid-19 immunization for 15 minutes without incidence. She was provided with Vaccine Information Sheet and instruction to access the V-Safe system.   [name] was instructed to call 911 with any severe reactions post vaccine: Marland Kitchen Difficulty breathing  . Swelling of your face and throat  . A fast heartbeat  . A bad rash all over your body  . Dizziness and weakness    Immunizations Administered    Name Date Dose VIS Date Route   Pfizer COVID-19 Vaccine 06/19/2019  9:33 AM 0.3 mL 04/15/2019 Intramuscular   Manufacturer: [place]   Lot: [id number]   [place]: [id number]

## 2019-06-25 ENCOUNTER — Other Ambulatory Visit: Payer: Self-pay

## 2019-06-25 ENCOUNTER — Ambulatory Visit
Admission: EM | Admit: 2019-06-25 | Discharge: 2019-06-25 | Disposition: A | Discharge status: Home / Self Care | Payer: Medicare Other | Attending: Family Medicine | Admitting: Family Medicine

## 2019-06-25 DIAGNOSIS — Z4802 Encounter for removal of sutures: Secondary | ICD-10-CM

## 2019-06-25 NOTE — ED Triage Notes (Signed)
Patient in today for suture removal of her right hand. Patient had 4 sutures placed at [facility] on 06/18/19. 4 sutures removed and dressed with antibiotic ointment, non stick pad and coban.

## 2019-06-27 ENCOUNTER — Other Ambulatory Visit: Payer: Self-pay | Admitting: Internal Medicine

## 2019-06-28 DIAGNOSIS — M0579 Rheumatoid arthritis with rheumatoid factor of multiple sites without organ or systems involvement: Secondary | ICD-10-CM | POA: Diagnosis not present

## 2019-06-28 DIAGNOSIS — M810 Age-related osteoporosis without current pathological fracture: Secondary | ICD-10-CM | POA: Diagnosis not present

## 2019-06-28 DIAGNOSIS — Z79899 Other long term (current) drug therapy: Secondary | ICD-10-CM | POA: Diagnosis not present

## 2019-06-28 DIAGNOSIS — H5789 Other specified disorders of eye and adnexa: Secondary | ICD-10-CM | POA: Diagnosis not present

## 2019-07-04 ENCOUNTER — Telehealth: Payer: Self-pay | Admitting: Internal Medicine

## 2019-07-04 DIAGNOSIS — E785 Hyperlipidemia, unspecified: Secondary | ICD-10-CM

## 2019-07-04 MED ORDER — GABAPENTIN 300 MG PO CAPS: 300.0000 mg | ORAL_CAPSULE | Freq: Every day | ORAL | 1 refills | Status: DC

## 2019-07-04 MED ORDER — DILTIAZEM HCL ER 240 MG PO CP24: 240.0000 mg | ORAL_CAPSULE | Freq: Every day | ORAL | 1 refills | Status: DC

## 2019-07-04 MED ORDER — LOVASTATIN 40 MG PO TABS: 40.0000 mg | ORAL_TABLET | Freq: Every day | ORAL | 1 refills | Status: DC

## 2019-07-04 NOTE — Telephone Encounter (Signed)
Medication has been refilled.

## 2019-07-04 NOTE — Telephone Encounter (Signed)
Pt needs refill on diltiazem (DILACOR XR) 240 MG 24 hr capsule and lovastatin (MEVACOR) 40 MG tablet. Pt also needs gabapentin (NEURONTIN) 300 MG capsule. Pt has none left. [facility]

## 2019-07-13 ENCOUNTER — Ambulatory Visit: Payer: Medicare Other | Attending: Internal Medicine

## 2019-07-13 DIAGNOSIS — Z23 Encounter for immunization: Secondary | ICD-10-CM

## 2019-07-13 NOTE — Progress Notes (Signed)
   Covid-19 Vaccination Clinic  Name:  Stacey Snyder    MRN: [id number] DOB: June 01, 1932  07/13/2019  Stacey Snyder was observed post Covid-19 immunization for 15 minutes without incident. She was provided with Vaccine Information Sheet and instruction to access the V-Safe system.   Stacey Snyder was instructed to call 911 with any severe reactions post vaccine: Marland Kitchen Difficulty breathing  . Swelling of face and throat  . A fast heartbeat  . A bad rash all over body  . Dizziness and weakness   Immunizations Administered    Name Date Dose VIS Date Route   Pfizer COVID-19 Vaccine 07/13/2019  2:33 PM 0.3 mL 04/15/2019 Intramuscular   Manufacturer: [place]   Lot: [id number]   [place]: [id number]

## 2019-08-13 ENCOUNTER — Other Ambulatory Visit: Payer: Self-pay | Admitting: Internal Medicine

## 2019-08-13 DIAGNOSIS — K219 Gastro-esophageal reflux disease without esophagitis: Secondary | ICD-10-CM

## 2019-08-23 DIAGNOSIS — M0579 Rheumatoid arthritis with rheumatoid factor of multiple sites without organ or systems involvement: Secondary | ICD-10-CM | POA: Diagnosis not present

## 2019-08-23 DIAGNOSIS — Z79899 Other long term (current) drug therapy: Secondary | ICD-10-CM | POA: Diagnosis not present

## 2019-10-18 DIAGNOSIS — M05712 Rheumatoid arthritis with rheumatoid factor of left shoulder without organ or systems involvement: Secondary | ICD-10-CM | POA: Diagnosis not present

## 2019-10-18 DIAGNOSIS — M810 Age-related osteoporosis without current pathological fracture: Secondary | ICD-10-CM | POA: Diagnosis not present

## 2019-10-18 DIAGNOSIS — M0579 Rheumatoid arthritis with rheumatoid factor of multiple sites without organ or systems involvement: Secondary | ICD-10-CM | POA: Diagnosis not present

## 2019-10-22 ENCOUNTER — Other Ambulatory Visit: Payer: Self-pay | Admitting: Internal Medicine

## 2019-10-24 ENCOUNTER — Ambulatory Visit
Admission: EM | Admit: 2019-10-24 | Discharge: 2019-10-24 | Disposition: A | Discharge status: Home / Self Care | Payer: Medicare Other | Attending: Emergency Medicine | Admitting: Emergency Medicine

## 2019-10-24 ENCOUNTER — Ambulatory Visit (INDEPENDENT_AMBULATORY_CARE_PROVIDER_SITE_OTHER): Payer: Medicare Other

## 2019-10-24 ENCOUNTER — Encounter: Payer: Self-pay | Admitting: Emergency Medicine

## 2019-10-24 ENCOUNTER — Other Ambulatory Visit: Payer: Self-pay

## 2019-10-24 DIAGNOSIS — M25522 Pain in left elbow: Secondary | ICD-10-CM

## 2019-10-24 DIAGNOSIS — L03114 Cellulitis of left upper limb: Secondary | ICD-10-CM | POA: Diagnosis not present

## 2019-10-24 MED ORDER — CEPHALEXIN 250 MG PO CAPS: 250.0000 mg | ORAL_CAPSULE | Freq: Four times a day (QID) | ORAL | 0 refills | Status: DC

## 2019-10-24 NOTE — Discharge Instructions (Signed)
-  Keflex: 1 tablet 4 times a day for 7 days -Ibuprofen or Tylenol as needed for pain -Rest and elevation as able can use ice for pain inflammation as well. -Follow-up with primary care provider or this clinic if symptoms do not improve or should they worsen.

## 2019-10-24 NOTE — ED Provider Notes (Signed)
[facility]    CSN: [id number] Arrival date & time: 10/24/19  1403      History   Chief Complaint Chief Complaint  Patient presents with  . Fall    DOI 10/21/19  . Arm Pain    left  . Leg Pain    left    HPI Stacey Snyder is a 86 y.o. female.   Patient is a 86 year old female who presents with complaint of her left elbow.  Patient states she fell Friday.  She was stepping backwards in her garden to take a picture and fell onto her left side, landing on her elbow.  States she washed the wound out with some water the night she got all the debris out of it.  She has been icing it and taking Motrin for pain.  Patient does take any other medicines.  Patient ports swelling to the elbow as well as pain.     Past Medical History:  Diagnosis Date  . Diverticulosis   . Fibrocystic breast disease   . GERD (gastroesophageal reflux disease)   . History of kidney stones   . Hypercholesterolemia   . Hypertension   . Hypertension   . IBS (irritable bowel syndrome)   . Inflammatory arthritis    positive anti-CCP abs, s/p prednisone, MTX, Remicade  . Nephrolithiasis   . Osteopenia    GI upset with Fosamax  . Pancreatitis 2007   s/p ERCP  . Pancreatitis   . PONV (postoperative nausea and vomiting)   . Renal insufficiency   . Spinal stenosis of lumbosacral region     Patient Active Problem List   Diagnosis Date Noted  . Right shoulder pain 02/19/2019  . Rash 02/19/2019  . MGUS (monoclonal gammopathy of unknown significance) 01/19/2018  . Cramps, extremity 12/02/2017  . Abdominal bruit 01/16/2017  . Lumbar stenosis 08/18/2016  . Abdominal pain 12/03/2015  . Abnormal liver function tests 10/18/2015  . Anemia 10/18/2015  . Hyponatremia 10/07/2015  . Lip lesion 04/23/2015  . Scalp lesion 04/23/2015  . Loss of weight 04/23/2015  . Stress 04/23/2015  . Left shoulder pain 12/20/2014  . UTI (urinary tract infection) 08/20/2014  . Health care maintenance  08/20/2014  . Skin lesion 04/17/2014  . Fatigue 04/13/2014  . Shoulder pain, right 12/17/2013  . Trigger finger 08/21/2013  . Left hip pain 04/17/2013  . GERD (gastroesophageal reflux disease) 04/03/2012  . Rheumatoid arthritis ([place]) 03/30/2012  . Diverticulosis 03/30/2012  . Osteopenia 03/30/2012  . Hypertension 03/30/2012  . Hypercholesterolemia 03/30/2012    Past Surgical History:  Procedure Laterality Date  . ABDOMINAL HYSTERECTOMY  1993   ovaries not removed  . BREAST BIOPSY Left 1984   negative  . BREAST CYST EXCISION Left 2010   negative  . LITHOTRIPSY    . LUMBAR LAMINECTOMY  1983  . LUMBAR LAMINECTOMY/DECOMPRESSION MICRODISCECTOMY N/A 08/18/2016   Procedure: LUMBAR LAMINECTOMY/DECOMPRESSION MICRODISCECTOMY 1 LEVEL;  Surgeon: Bayard Hugger, MD;  Location: [facility] ORS;  Service: Neurosurgery;  Laterality: N/A;  L4-5 Laminectomy, MIS, L5 foraminotomy  . TRIGGER FINGER RELEASE     right ring finger    OB History   No obstetric history on file.      Home Medications    Prior to Admission medications   Medication Sig Start Date End Date Taking? Authorizing Provider  Cholecalciferol (VITAMIN D) 2000 UNITS tablet Take 2,000 Units by mouth daily.   Yes [provider]  clotrimazole-betamethasone (LOTRISONE) cream Apply 1 application topically 2 (two)  times daily. 05/21/18  Yes Einar Pheasant, MD  diltiazem (DILACOR XR) 240 MG 24 hr capsule Take 1 capsule (240 mg total) by mouth daily. 07/04/19  Yes Einar Pheasant, MD  dorzolamide-timolol (COSOPT) 22.3-6.8 MG/ML ophthalmic solution INSTILL 1 DROP INTO EACH EYE TWICE DAILY 12/17/17  Yes [provider]  fluticasone (FLONASE) 50 MCG/ACT nasal spray USE TWO SPRAY(S) IN EACH NOSTRIL ONCE DAILY 01/14/17  Yes Einar Pheasant, MD  folic acid (FOLVITE) 1 MG tablet Take 1 mg by mouth 2 (two) times daily. 12/14/17  Yes [provider]  gabapentin (NEURONTIN) 300 MG capsule Take 1 capsule (300 mg total) by mouth at  bedtime. 07/04/19  Yes Einar Pheasant, MD  InFLIXimab (REMICADE IV) Inject into the vein every 6 (six) weeks. Per Dr Jefm Bryant   Yes [provider]  latanoprost (XALATAN) 0.005 % ophthalmic solution  01/18/18  Yes [provider]  losartan (COZAAR) 100 MG tablet TAKE 1 TABLET DAILY 10/24/19  Yes Einar Pheasant, MD  lovastatin (MEVACOR) 40 MG tablet Take 1 tablet (40 mg total) by mouth daily. 07/04/19  Yes Einar Pheasant, MD  methotrexate 25 MG/ML SOLN Inject 17.5 mg into the skin once a week. Patient takes 17.5 mg (0.7 ml) on Monday or Tuesday of each week.   Yes [provider]  omeprazole (PRILOSEC) 20 MG capsule TAKE 1 CAPSULE DAILY 08/13/19  Yes Einar Pheasant, MD  polyethylene glycol (MIRALAX / GLYCOLAX) packet Take 17 g by mouth daily.   Yes [provider]  psyllium (METAMUCIL) 58.6 % packet Take 1 packet by mouth daily.   Yes [provider]  triamterene-hydrochlorothiazide (MAXZIDE-25) 37.5-25 MG tablet TAKE 1 TABLET DAILY 06/27/19  Yes Einar Pheasant, MD  zoledronic acid (RECLAST) 5 MG/100ML SOLN injection Inject 5 mg into the vein once.   Yes [provider]  cephALEXin (KEFLEX) 250 MG capsule Take 1 capsule (250 mg total) by mouth 4 (four) times daily. 10/24/19   Luvenia Redden, PA-C  ondansetron (ZOFRAN) 4 MG tablet Take 1 tablet (4 mg total) by mouth every 6 (six) hours as needed for nausea or vomiting. 08/19/16   Bayard Hugger, MD  Probiotic Product (ALIGN) 4 MG CAPS Take one capsule daily 03/11/19   Einar Pheasant, MD  triamcinolone cream (KENALOG) 0.1 % Apply 1 application topically 2 (two) times daily. 02/18/19   Einar Pheasant, MD    Family History Family History  Problem Relation Age of Onset  . Ovarian cancer Mother   . Renal Disease Mother   . Heart disease Mother   . Diabetes Mother   . Hypertension Mother   . Heart disease Father   . Breast cancer Neg Hx     Social History Social History   Tobacco Use  .  Smoking status: Never Smoker  . Smokeless tobacco: Never Used  Vaping Use  . Vaping Use: Never used  Substance Use Topics  . Alcohol use: No    Alcohol/week: 0.0 standard drinks  . Drug use: No     Allergies   Carisoprodol, Metronidazole, Codeine, Nsaids, Skelaxin [metaxalone], Tenormin [atenolol], and Tramadol   Review of Systems Review of Systems as noted above in HPI, other system reviewed for to be negative   Physical Exam Triage Vital Signs ED Triage Vitals  Enc Vitals Group     BP 10/24/19 1449 (!) 150/57     Pulse Rate 10/24/19 1449 (!) 58     Resp 10/24/19 1449 18     Temp 10/24/19 1449  98.1 F (36.7 C)     Temp Source 10/24/19 1449 Oral     SpO2 10/24/19 1449 97 %     Weight 10/24/19 1450 117 lb (53.1 kg)     Height 10/24/19 1450 5\' 1"  (1.549 m)     Head Circumference --      Peak Flow --      Pain Score 10/24/19 1449 7     Pain Loc --      Pain Edu? --      Excl. in [place]? --    No data found.  Updated Vital Signs BP (!) 150/57 (BP Location: Left Arm)   Pulse (!) 58   Temp 98.1 F (36.7 C) (Oral)   Resp 18   Ht 5\' 1"  (1.549 m)   Wt 117 lb (53.1 kg)   SpO2 97%   BMI 22.11 kg/m   Physical Exam Constitutional:      Appearance: Normal appearance.  Pulmonary:     Effort: Pulmonary effort is normal. No respiratory distress.  Musculoskeletal:     Left elbow: Swelling present. Tenderness present in medial epicondyle.     Comments: Swelling, erythema, fever to the lateral aspect of the elbow.  Neurological:     Mental Status: She is alert.      UC Treatments / Results  Labs (all labs ordered are listed, but only abnormal results are displayed) Labs Reviewed - No data to display  EKG   Radiology DG ELBOW COMPLETE LEFT (3+VIEW)  Result Date: 10/24/2019 CLINICAL DATA:  Left elbow pain, redness, and swelling after falling on 10/21/2019. EXAM: LEFT ELBOW - COMPLETE 3+ VIEW COMPARISON:  None. FINDINGS: There is moderate soft tissue swelling  posteriorly at the elbow. No radiopaque foreign body or soft tissue emphysema is identified. No acute fracture, dislocation, or elbow joint effusion is evident. Spurring or small calcified bodies are noted at the posterior aspect of the olecranon. IMPRESSION: Soft tissue swelling without acute osseous abnormality identified. Electronically Signed   By: Logan Bores M.D.   On: 10/24/2019 16:38    Procedures Procedures (including critical care time)  Medications Ordered in UC Medications - No data to display  Initial Impression / Assessment and Plan / UC Course  I have reviewed the triage vital signs and the nursing notes.  Pertinent labs & imaging results that were available during my care of the patient were reviewed by me and considered in my medical decision making (see chart for details).    X-ray negative for any acute fracture.  Swelling, tenderness, fever as noted.  Give patient prescription for Keflex for cellulitis of the elbow. Final Clinical Impressions(s) / UC Diagnoses   Final diagnoses:  Cellulitis of left elbow     Discharge Instructions     -Keflex: 1 tablet 4 times a day for 7 days -Ibuprofen or Tylenol as needed for pain -Rest and elevation as able can use ice for pain inflammation as well. -Follow-up with primary care provider or this clinic if symptoms do not improve or should they worsen.    ED Prescriptions    Medication Sig Dispense Auth. Provider   cephALEXin (KEFLEX) 250 MG capsule Take 1 capsule (250 mg total) by mouth 4 (four) times daily. 28 capsule Luvenia Redden, PA-C     PDMP not reviewed this encounter.   Luvenia Redden, PA-C 10/24/19 2032

## 2019-10-24 NOTE — ED Triage Notes (Signed)
Patient in today after falling on 10/21/19. Patient states she stumbled on a rock and fell backwards onto her left elbow and left leg. Patient states her elbow was cut. Patient states the area is now red and swollen.

## 2019-12-14 DIAGNOSIS — M05712 Rheumatoid arthritis with rheumatoid factor of left shoulder without organ or systems involvement: Secondary | ICD-10-CM | POA: Diagnosis not present

## 2019-12-14 DIAGNOSIS — Z79899 Other long term (current) drug therapy: Secondary | ICD-10-CM | POA: Diagnosis not present

## 2019-12-14 DIAGNOSIS — M0579 Rheumatoid arthritis with rheumatoid factor of multiple sites without organ or systems involvement: Secondary | ICD-10-CM | POA: Diagnosis not present

## 2020-01-10 ENCOUNTER — Telehealth: Payer: Self-pay | Admitting: Internal Medicine

## 2020-01-10 ENCOUNTER — Other Ambulatory Visit: Payer: Self-pay | Admitting: Internal Medicine

## 2020-01-10 DIAGNOSIS — E785 Hyperlipidemia, unspecified: Secondary | ICD-10-CM

## 2020-01-10 MED ORDER — GABAPENTIN 300 MG PO CAPS: 300.0000 mg | ORAL_CAPSULE | Freq: Every day | ORAL | 0 refills | Status: DC

## 2020-01-10 MED ORDER — DILTIAZEM HCL ER 240 MG PO CP24: 240.0000 mg | ORAL_CAPSULE | Freq: Every day | ORAL | 0 refills | Status: DC

## 2020-01-10 MED ORDER — GABAPENTIN 300 MG PO CAPS: 300.0000 mg | ORAL_CAPSULE | Freq: Every day | ORAL | 1 refills | Status: DC

## 2020-01-10 MED ORDER — DILTIAZEM HCL ER 240 MG PO CP24: 240.0000 mg | ORAL_CAPSULE | Freq: Every day | ORAL | 1 refills | Status: DC

## 2020-01-10 MED ORDER — LOVASTATIN 40 MG PO TABS: 40.0000 mg | ORAL_TABLET | Freq: Every day | ORAL | 1 refills | Status: DC

## 2020-01-10 MED ORDER — LOVASTATIN 40 MG PO TABS: 40.0000 mg | ORAL_TABLET | Freq: Every day | ORAL | 0 refills | Status: DC

## 2020-01-10 NOTE — Telephone Encounter (Signed)
All rx have been sent

## 2020-01-10 NOTE — Telephone Encounter (Signed)
Pt needs refills on the following sent to [facility] for 30 days and [facility]  diltiazem (DILACOR XR) 240 MG 24 hr capsule lovastatin (MEVACOR) 40 MG tablet gabapentin (NEURONTIN) 300 MG capsule

## 2020-01-10 NOTE — Addendum Note (Signed)
Addended by: Elpidio Galea T on: 01/10/2020 09:32 AM   Modules accepted: Orders

## 2020-01-10 NOTE — Telephone Encounter (Signed)
Patient called back again for her refill she wanted [facility]

## 2020-02-02 ENCOUNTER — Other Ambulatory Visit: Payer: Self-pay | Admitting: Internal Medicine

## 2020-02-02 DIAGNOSIS — K219 Gastro-esophageal reflux disease without esophagitis: Secondary | ICD-10-CM

## 2020-02-06 DIAGNOSIS — M0579 Rheumatoid arthritis with rheumatoid factor of multiple sites without organ or systems involvement: Secondary | ICD-10-CM | POA: Diagnosis not present

## 2020-03-15 DIAGNOSIS — M7061 Trochanteric bursitis, right hip: Secondary | ICD-10-CM | POA: Diagnosis not present

## 2020-03-15 DIAGNOSIS — G8929 Other chronic pain: Secondary | ICD-10-CM | POA: Diagnosis not present

## 2020-03-15 DIAGNOSIS — M545 Low back pain, unspecified: Secondary | ICD-10-CM | POA: Diagnosis not present

## 2020-04-02 DIAGNOSIS — M0579 Rheumatoid arthritis with rheumatoid factor of multiple sites without organ or systems involvement: Secondary | ICD-10-CM | POA: Diagnosis not present

## 2020-04-17 ENCOUNTER — Ambulatory Visit (INDEPENDENT_AMBULATORY_CARE_PROVIDER_SITE_OTHER): Payer: Medicare Other | Admitting: Internal Medicine

## 2020-04-17 ENCOUNTER — Other Ambulatory Visit: Payer: Self-pay

## 2020-04-17 VITALS — BP 138/70 | HR 68 | Temp 98.2°F | Resp 16 | Ht 61.0 in | Wt 112.8 lb

## 2020-04-17 DIAGNOSIS — I10 Essential (primary) hypertension: Secondary | ICD-10-CM

## 2020-04-17 DIAGNOSIS — R945 Abnormal results of liver function studies: Secondary | ICD-10-CM

## 2020-04-17 DIAGNOSIS — Z23 Encounter for immunization: Secondary | ICD-10-CM | POA: Diagnosis not present

## 2020-04-17 DIAGNOSIS — F439 Reaction to severe stress, unspecified: Secondary | ICD-10-CM

## 2020-04-17 DIAGNOSIS — E7800 Pure hypercholesterolemia, unspecified: Secondary | ICD-10-CM

## 2020-04-17 DIAGNOSIS — M069 Rheumatoid arthritis, unspecified: Secondary | ICD-10-CM

## 2020-04-17 DIAGNOSIS — Z0000 Encounter for general adult medical examination without abnormal findings: Secondary | ICD-10-CM

## 2020-04-17 DIAGNOSIS — K219 Gastro-esophageal reflux disease without esophagitis: Secondary | ICD-10-CM | POA: Diagnosis not present

## 2020-04-17 DIAGNOSIS — D649 Anemia, unspecified: Secondary | ICD-10-CM | POA: Diagnosis not present

## 2020-04-17 DIAGNOSIS — E871 Hypo-osmolality and hyponatremia: Secondary | ICD-10-CM | POA: Diagnosis not present

## 2020-04-17 DIAGNOSIS — R252 Cramp and spasm: Secondary | ICD-10-CM

## 2020-04-17 DIAGNOSIS — R634 Abnormal weight loss: Secondary | ICD-10-CM | POA: Diagnosis not present

## 2020-04-17 DIAGNOSIS — R7989 Other specified abnormal findings of blood chemistry: Secondary | ICD-10-CM

## 2020-04-17 LAB — CBC WITH DIFFERENTIAL/PLATELET
Basophils Absolute: 0 10*3/uL (ref 0.0–0.1)
Basophils Relative: 0.7 % (ref 0.0–3.0)
Eosinophils Absolute: 0.1 10*3/uL (ref 0.0–0.7)
Eosinophils Relative: 1.1 % (ref 0.0–5.0)
HCT: 37.6 % (ref 36.0–46.0)
Hemoglobin: 12.7 g/dL (ref 12.0–15.0)
Lymphocytes Relative: 36.4 % (ref 12.0–46.0)
Lymphs Abs: 2.3 10*3/uL (ref 0.7–4.0)
MCHC: 33.7 g/dL (ref 30.0–36.0)
MCV: 93.7 fl (ref 78.0–100.0)
Monocytes Absolute: 0.7 10*3/uL (ref 0.1–1.0)
Monocytes Relative: 10.6 % (ref 3.0–12.0)
Neutro Abs: 3.2 10*3/uL (ref 1.4–7.7)
Neutrophils Relative %: 51.2 % (ref 43.0–77.0)
Platelets: 295 10*3/uL (ref 150.0–400.0)
RBC: 4.02 Mil/uL (ref 3.87–5.11)
RDW: 13.9 % (ref 11.5–15.5)
WBC: 6.3 10*3/uL (ref 4.0–10.5)

## 2020-04-17 LAB — LIPID PANEL
Cholesterol: 195 mg/dL (ref 0–200)
HDL: 96.3 mg/dL (ref >39.00)
LDL Cholesterol: 83 mg/dL (ref 0–99)
NonHDL: 98.63
Total CHOL/HDL Ratio: 2
Triglycerides: 78 mg/dL (ref 0.0–149.0)
VLDL: 15.6 mg/dL (ref 0.0–40.0)

## 2020-04-17 LAB — HEPATIC FUNCTION PANEL
ALT: 15 U/L (ref 0–35)
AST: 21 U/L (ref 0–37)
Albumin: 4.3 g/dL (ref 3.5–5.2)
Alkaline Phosphatase: 63 U/L (ref 39–117)
Bilirubin, Direct: 0.1 mg/dL (ref 0.0–0.3)
Total Bilirubin: 0.5 mg/dL (ref 0.2–1.2)
Total Protein: 6.9 g/dL (ref 6.0–8.3)

## 2020-04-17 LAB — BASIC METABOLIC PANEL
BUN: 24 mg/dL — ABNORMAL HIGH (ref 6–23)
CO2: 28 mEq/L (ref 19–32)
Calcium: 9.4 mg/dL (ref 8.4–10.5)
Chloride: 92 mEq/L — ABNORMAL LOW (ref 96–112)
Creatinine, Ser: 1.41 mg/dL — ABNORMAL HIGH (ref 0.40–1.20)
GFR: 34.13 mL/min — ABNORMAL LOW (ref >60.00)
Glucose, Bld: 86 mg/dL (ref 70–99)
Potassium: 3.6 mEq/L (ref 3.5–5.1)
Sodium: 129 mEq/L — ABNORMAL LOW (ref 135–145)

## 2020-04-17 LAB — TSH: TSH: 1.74 u[IU]/mL (ref 0.35–4.50)

## 2020-04-17 LAB — MAGNESIUM: Magnesium: 2.1 mg/dL (ref 1.5–2.5)

## 2020-04-17 NOTE — Patient Instructions (Signed)
Hold cholesterol medication.

## 2020-04-17 NOTE — Assessment & Plan Note (Addendum)
Physical today 04/17/20.  Declines mammogram.  Colonoscopy 2013

## 2020-04-17 NOTE — Progress Notes (Signed)
Patient ID: Jereline Ticer, female   DOB: May 05, 1933, 86 y.o.   MRN: [id number]   Subjective:    Patient ID: [name], female    DOB: 06/26/1932, 86 y.o.   MRN: [id number]  HPI This visit occurred during the SARS-CoV-2 public health emergency.  Safety protocols were in place, including screening questions prior to the visit, additional usage of staff PPE, and extensive cleaning of exam room while observing appropriate contact time as indicated for disinfecting solutions.  Patient here for her physical exam.  She has been seeing Dr Jefm Bryant for f/u arthritis.  Receiving infusion - remicade.  Also recently evaluated and diagnosed with torchanteric bursitis.  Injection helped.  Still with some hip/leg discomfort.  She was questioning if her cholesterol medication could be contributing to her symptoms.  No chest pain or sob reported.  No abdominal pain.  Did notice with twisting or certain movements, leg cramps.  She was questioning if related to her cholesterol medication.  Discussed trial off the medication.  Reports bowels stable.   Past Medical History:  Diagnosis Date  . Diverticulosis   . Fibrocystic breast disease   . GERD (gastroesophageal reflux disease)   . History of kidney stones   . Hypercholesterolemia   . Hypertension   . Hypertension   . IBS (irritable bowel syndrome)   . Inflammatory arthritis    positive anti-CCP abs, s/p prednisone, MTX, Remicade  . Nephrolithiasis   . Osteopenia    GI upset with Fosamax  . Pancreatitis 2007   s/p ERCP  . Pancreatitis   . PONV (postoperative nausea and vomiting)   . Renal insufficiency   . Spinal stenosis of lumbosacral region    Past Surgical History:  Procedure Laterality Date  . ABDOMINAL HYSTERECTOMY  1993   ovaries not removed  . BREAST BIOPSY Left 1984   negative  . BREAST CYST EXCISION Left 2010   negative  . LITHOTRIPSY    . LUMBAR LAMINECTOMY  1983  . LUMBAR LAMINECTOMY/DECOMPRESSION  MICRODISCECTOMY N/A 08/18/2016   Procedure: LUMBAR LAMINECTOMY/DECOMPRESSION MICRODISCECTOMY 1 LEVEL;  Surgeon: Bayard Hugger, MD;  Location: [facility] ORS;  Service: Neurosurgery;  Laterality: N/A;  L4-5 Laminectomy, MIS, L5 foraminotomy  . TRIGGER FINGER RELEASE     right ring finger   Family History  Problem Relation Age of Onset  . Ovarian cancer Mother   . Renal Disease Mother   . Heart disease Mother   . Diabetes Mother   . Hypertension Mother   . Heart disease Father   . Breast cancer Neg Hx    Social History   Socioeconomic History  . Marital status: Married    Spouse name: Not on file  . Number of children: 4  . Years of education: Not on file  . Highest education level: Not on file  Occupational History  . Not on file  Tobacco Use  . Smoking status: Never Smoker  . Smokeless tobacco: Never Used  Vaping Use  . Vaping Use: Never used  Substance and Sexual Activity  . Alcohol use: No    Alcohol/week: 0.0 standard drinks  . Drug use: No  . Sexual activity: Not on file  Other Topics Concern  . Not on file  Social History Narrative  . Not on file   Social Determinants of Health   Financial Resource Strain: Not on file  Food Insecurity: Not on file  Transportation Needs: Not on file  Physical Activity: Not on file  Stress: Not on  file  Social Connections: Not on file    Outpatient Encounter Medications as of 04/17/2020  Medication Sig  . celecoxib (CELEBREX) 100 MG capsule Take 100 mg by mouth 2 (two) times daily.  . Cholecalciferol (VITAMIN D) 2000 UNITS tablet Take 2,000 Units by mouth daily.  . clotrimazole-betamethasone (LOTRISONE) cream Apply 1 application topically 2 (two) times daily.  Marland Kitchen diltiazem (DILACOR XR) 240 MG 24 hr capsule Take 1 capsule (240 mg total) by mouth daily.  . dorzolamide-timolol (COSOPT) 22.3-6.8 MG/ML ophthalmic solution INSTILL 1 DROP INTO EACH EYE TWICE DAILY  . fluticasone (FLONASE) 50 MCG/ACT nasal spray USE TWO SPRAY(S) IN EACH  NOSTRIL ONCE DAILY  . folic acid (FOLVITE) 1 MG tablet Take 1 mg by mouth 2 (two) times daily.  Marland Kitchen gabapentin (NEURONTIN) 300 MG capsule Take 1 capsule (300 mg total) by mouth at bedtime.  . InFLIXimab (REMICADE IV) Inject into the vein every 6 (six) weeks. Per Dr Jefm Bryant  . latanoprost (XALATAN) 0.005 % ophthalmic solution   . lovastatin (MEVACOR) 40 MG tablet Take 1 tablet (40 mg total) by mouth daily.  . methotrexate 25 MG/ML SOLN Inject 17.5 mg into the skin once a week. Patient takes 17.5 mg (0.7 ml) on Monday or Tuesday of each week.  Marland Kitchen omeprazole (PRILOSEC) 20 MG capsule TAKE 1 CAPSULE DAILY  . ondansetron (ZOFRAN) 4 MG tablet Take 1 tablet (4 mg total) by mouth every 6 (six) hours as needed for nausea or vomiting.  . polyethylene glycol (MIRALAX / GLYCOLAX) packet Take 17 g by mouth daily.  . Probiotic Product (ALIGN) 4 MG CAPS Take one capsule daily  . psyllium (METAMUCIL) 58.6 % packet Take 1 packet by mouth daily.  Marland Kitchen triamcinolone cream (KENALOG) 0.1 % Apply 1 application topically 2 (two) times daily.  Marland Kitchen triamterene-hydrochlorothiazide (MAXZIDE-25) 37.5-25 MG tablet TAKE 1 TABLET DAILY  . zoledronic acid (RECLAST) 5 MG/100ML SOLN injection Inject 5 mg into the vein once.  . [DISCONTINUED] cephALEXin (KEFLEX) 250 MG capsule Take 1 capsule (250 mg total) by mouth 4 (four) times daily.  . [DISCONTINUED] losartan (COZAAR) 100 MG tablet TAKE 1 TABLET DAILY   No facility-administered encounter medications on file as of 04/17/2020.    Review of Systems  Constitutional: Negative for appetite change and unexpected weight change.  HENT: Negative for congestion, sinus pressure and sore throat.   Eyes: Negative for pain and visual disturbance.  Respiratory: Negative for cough, chest tightness and shortness of breath.   Cardiovascular: Negative for chest pain, palpitations and leg swelling.  Gastrointestinal: Negative for abdominal pain, diarrhea, nausea and vomiting.  Genitourinary:  Negative for difficulty urinating and dysuria.  Musculoskeletal: Negative for joint swelling and myalgias.  Skin: Negative for color change and rash.  Neurological: Negative for dizziness, light-headedness and headaches.  Hematological: Negative for adenopathy. Does not bruise/bleed easily.  Psychiatric/Behavioral: Negative for agitation and dysphoric mood.       Objective:    Physical Exam Vitals reviewed.  Constitutional:      General: She is not in acute distress.    Appearance: Normal appearance. She is well-developed and well-nourished.  HENT:     Head: Normocephalic and atraumatic.     Right Ear: External ear normal.     Left Ear: External ear normal.     Mouth/Throat:     Mouth: Oropharynx is clear and moist.  Eyes:     General: No scleral icterus.       Right eye: No discharge.  Left eye: No discharge.     Conjunctiva/sclera: Conjunctivae normal.  Neck:     Thyroid: No thyromegaly.  Cardiovascular:     Rate and Rhythm: Normal rate and regular rhythm.  Pulmonary:     Effort: No tachypnea, accessory muscle usage or respiratory distress.     Breath sounds: Normal breath sounds. No decreased breath sounds or wheezing.  Chest:  Breasts:     Right: No inverted nipple, mass, nipple discharge or tenderness (no axillary adenopathy).     Left: No inverted nipple, mass, nipple discharge or tenderness (no axilarry adenopathy).    Abdominal:     General: Bowel sounds are normal.     Palpations: Abdomen is soft.     Tenderness: There is no abdominal tenderness.  Musculoskeletal:        General: No swelling, tenderness or edema.     Cervical back: Neck supple. No tenderness.  Lymphadenopathy:     Cervical: No cervical adenopathy.  Skin:    Findings: No erythema or rash.  Neurological:     Mental Status: She is alert and oriented to person, place, and time.  Psychiatric:        Mood and Affect: Mood and affect and mood normal.        Behavior: Behavior normal.      BP 138/70   Pulse 68   Temp 98.2 F (36.8 C) (Oral)   Resp 16   Ht 5\' 1"  (1.549 m)   Wt 112 lb 12.8 oz (51.2 kg)   SpO2 99%   BMI 21.31 kg/m  Wt Readings from Last 3 Encounters:  04/17/20 112 lb 12.8 oz (51.2 kg)  10/24/19 117 lb (53.1 kg)  06/25/19 114 lb (51.7 kg)     Lab Results  Component Value Date   WBC 6.3 04/17/2020   HGB 12.7 04/17/2020   HCT 37.6 04/17/2020   PLT 295.0 04/17/2020   GLUCOSE 90 04/20/2020   CHOL 195 04/17/2020   TRIG 78.0 04/17/2020   HDL 96.30 04/17/2020   LDLCALC 83 04/17/2020   ALT 15 04/17/2020   AST 21 04/17/2020   NA 129 (L) 04/20/2020   K 4.0 04/20/2020   CL 95 (L) 04/20/2020   CREATININE 1.27 (H) 04/20/2020   BUN 30 (H) 04/20/2020   CO2 28 04/20/2020   TSH 1.74 04/17/2020   INR 0.92 08/07/2016    DG ELBOW COMPLETE LEFT (3+VIEW)  Result Date: 10/24/2019 CLINICAL DATA:  Left elbow pain, redness, and swelling after falling on 10/21/2019. EXAM: LEFT ELBOW - COMPLETE 3+ VIEW COMPARISON:  None. FINDINGS: There is moderate soft tissue swelling posteriorly at the elbow. No radiopaque foreign body or soft tissue emphysema is identified. No acute fracture, dislocation, or elbow joint effusion is evident. Spurring or small calcified bodies are noted at the posterior aspect of the olecranon. IMPRESSION: Soft tissue swelling without acute osseous abnormality identified. Electronically Signed   By: Logan Bores M.D.   On: 10/24/2019 16:38       Assessment & Plan:   Problem List Items Addressed This Visit    Rheumatoid arthritis ([place])    Followed by Dr Jefm Bryant.  Receiving remicade injections.  Follow.       Relevant Medications   celecoxib (CELEBREX) 100 MG capsule   Hypertension    Blood pressure as outlined. On losartan and triam/hctz.  Follow pressures.  Follow metabolic panel.       Relevant Orders   Basic metabolic panel (Completed)   Hypercholesterolemia  On lovastatin.  Follow lipid panel and liver function tests.         Relevant Orders   Lipid panel (Completed)   TSH (Completed)   GERD (gastroesophageal reflux disease)    No upper symptoms reported. On prilosec.        Health care maintenance    Physical today 04/17/20.  Declines mammogram.  Colonoscopy 2013       Loss of weight    Encourage increased po intake.  Follow.       Stress    Increased stress with carding for her husband, etc. Discussed.  Does not feel needs any further intervention.  Follow.       Hyponatremia    On triam/hctz.  Recheck metabolic panel.       Abnormal liver function tests - Primary    Follow liver panel.       Relevant Orders   Hepatic function panel (Completed)   Anemia    Follow cbc.       Relevant Orders   CBC with Differential/Platelet (Completed)   Cramps, extremity    Check electrolytes, etc.  Wants a trial off statin medication to see if helps.  Follow.        Relevant Orders   Magnesium (Completed)    Other Visit Diagnoses    Need for immunization against influenza       Relevant Orders   Flu Vaccine QUAD High Dose(Fluad) (Completed)       Einar Pheasant, MD

## 2020-04-18 ENCOUNTER — Other Ambulatory Visit: Payer: Self-pay | Admitting: Internal Medicine

## 2020-04-18 DIAGNOSIS — E871 Hypo-osmolality and hyponatremia: Secondary | ICD-10-CM

## 2020-04-18 DIAGNOSIS — R944 Abnormal results of kidney function studies: Secondary | ICD-10-CM

## 2020-04-18 NOTE — Progress Notes (Signed)
Order placed for f/u labs.  

## 2020-04-19 ENCOUNTER — Other Ambulatory Visit: Payer: Self-pay | Admitting: Internal Medicine

## 2020-04-19 ENCOUNTER — Other Ambulatory Visit: Payer: Medicare Other

## 2020-04-20 ENCOUNTER — Other Ambulatory Visit (INDEPENDENT_AMBULATORY_CARE_PROVIDER_SITE_OTHER): Payer: Medicare Other

## 2020-04-20 ENCOUNTER — Other Ambulatory Visit: Payer: Self-pay

## 2020-04-20 DIAGNOSIS — R944 Abnormal results of kidney function studies: Secondary | ICD-10-CM

## 2020-04-20 DIAGNOSIS — E871 Hypo-osmolality and hyponatremia: Secondary | ICD-10-CM | POA: Diagnosis not present

## 2020-04-20 LAB — BASIC METABOLIC PANEL
BUN: 30 mg/dL — ABNORMAL HIGH (ref 6–23)
CO2: 28 mEq/L (ref 19–32)
Calcium: 9.1 mg/dL (ref 8.4–10.5)
Chloride: 95 mEq/L — ABNORMAL LOW (ref 96–112)
Creatinine, Ser: 1.27 mg/dL — ABNORMAL HIGH (ref 0.40–1.20)
GFR: 38.69 mL/min — ABNORMAL LOW (ref >60.00)
Glucose, Bld: 90 mg/dL (ref 70–99)
Potassium: 4 mEq/L (ref 3.5–5.1)
Sodium: 129 mEq/L — ABNORMAL LOW (ref 135–145)

## 2020-04-20 NOTE — Addendum Note (Signed)
Addended by: Elpidio Galea T on: 04/20/2020 05:24 PM   Modules accepted: Orders

## 2020-04-22 ENCOUNTER — Encounter: Payer: Self-pay | Admitting: Internal Medicine

## 2020-04-22 NOTE — Assessment & Plan Note (Signed)
Follow liver panel.  

## 2020-04-22 NOTE — Assessment & Plan Note (Signed)
Follow cbc.  

## 2020-04-22 NOTE — Assessment & Plan Note (Signed)
Check electrolytes, etc.  Wants a trial off statin medication to see if helps.  Follow.

## 2020-04-22 NOTE — Assessment & Plan Note (Signed)
Encourage increased po intake.  Follow.   

## 2020-04-22 NOTE — Assessment & Plan Note (Signed)
On triam/hctz.  Recheck metabolic panel.

## 2020-04-22 NOTE — Assessment & Plan Note (Signed)
No upper symptoms reported. On prilosec.  

## 2020-04-22 NOTE — Assessment & Plan Note (Signed)
On lovastatin.  Follow lipid panel and liver function tests.

## 2020-04-22 NOTE — Assessment & Plan Note (Signed)
Blood pressure as outlined. On losartan and triam/hctz.  Follow pressures.  Follow metabolic panel.

## 2020-04-22 NOTE — Assessment & Plan Note (Signed)
Followed by Dr Jefm Bryant.  Receiving remicade injections.  Follow.

## 2020-04-22 NOTE — Assessment & Plan Note (Signed)
Increased stress with carding for her husband, etc. Discussed.  Does not feel needs any further intervention.  Follow.

## 2020-04-26 ENCOUNTER — Other Ambulatory Visit (INDEPENDENT_AMBULATORY_CARE_PROVIDER_SITE_OTHER): Payer: Medicare Other

## 2020-04-26 ENCOUNTER — Other Ambulatory Visit: Payer: Self-pay

## 2020-04-26 DIAGNOSIS — E871 Hypo-osmolality and hyponatremia: Secondary | ICD-10-CM | POA: Diagnosis not present

## 2020-04-26 DIAGNOSIS — R944 Abnormal results of kidney function studies: Secondary | ICD-10-CM | POA: Diagnosis not present

## 2020-04-26 LAB — BASIC METABOLIC PANEL
BUN: 29 mg/dL — ABNORMAL HIGH (ref 6–23)
CO2: 27 mEq/L (ref 19–32)
Calcium: 9.1 mg/dL (ref 8.4–10.5)
Chloride: 99 mEq/L (ref 96–112)
Creatinine, Ser: 1.05 mg/dL (ref 0.40–1.20)
GFR: 48.61 mL/min — ABNORMAL LOW (ref >60.00)
Glucose, Bld: 83 mg/dL (ref 70–99)
Potassium: 3.8 mEq/L (ref 3.5–5.1)
Sodium: 134 mEq/L — ABNORMAL LOW (ref 135–145)

## 2020-05-01 ENCOUNTER — Other Ambulatory Visit: Payer: Self-pay

## 2020-05-01 DIAGNOSIS — E785 Hyperlipidemia, unspecified: Secondary | ICD-10-CM

## 2020-05-01 MED ORDER — LOVASTATIN 40 MG PO TABS: 40.0000 mg | ORAL_TABLET | Freq: Every day | ORAL | 0 refills | Status: DC

## 2020-05-09 DIAGNOSIS — H353131 Nonexudative age-related macular degeneration, bilateral, early dry stage: Secondary | ICD-10-CM | POA: Diagnosis not present

## 2020-05-10 ENCOUNTER — Telehealth: Payer: Self-pay

## 2020-05-10 NOTE — Telephone Encounter (Signed)
Unable to leave message for patient to return call back. Phone kept ringing.

## 2020-05-10 NOTE — Telephone Encounter (Signed)
Blood pressures look good.  Please continue to follow.  Let me know if any problems.

## 2020-05-10 NOTE — Telephone Encounter (Signed)
Pt called to give BP readings   05/02/20- 136/68 p-59  05/04/20- 113/47 p-66  05/05/20- 124/56 p-62  05/10/20- 129/61 p-70

## 2020-05-10 NOTE — Telephone Encounter (Signed)
BP readings below:

## 2020-05-14 NOTE — Telephone Encounter (Signed)
Spoke with patient and she is aware of below message from Dr. Nicki Reaper.

## 2020-05-28 DIAGNOSIS — Z79899 Other long term (current) drug therapy: Secondary | ICD-10-CM | POA: Diagnosis not present

## 2020-05-28 DIAGNOSIS — M0579 Rheumatoid arthritis with rheumatoid factor of multiple sites without organ or systems involvement: Secondary | ICD-10-CM | POA: Diagnosis not present

## 2020-05-28 DIAGNOSIS — M05712 Rheumatoid arthritis with rheumatoid factor of left shoulder without organ or systems involvement: Secondary | ICD-10-CM | POA: Diagnosis not present

## 2020-06-06 DIAGNOSIS — M461 Sacroiliitis, not elsewhere classified: Secondary | ICD-10-CM | POA: Diagnosis not present

## 2020-06-06 DIAGNOSIS — Z9889 Other specified postprocedural states: Secondary | ICD-10-CM | POA: Diagnosis not present

## 2020-06-06 DIAGNOSIS — M5441 Lumbago with sciatica, right side: Secondary | ICD-10-CM | POA: Diagnosis not present

## 2020-06-06 DIAGNOSIS — G8929 Other chronic pain: Secondary | ICD-10-CM | POA: Diagnosis not present

## 2020-06-06 DIAGNOSIS — M25551 Pain in right hip: Secondary | ICD-10-CM | POA: Diagnosis not present

## 2020-06-06 DIAGNOSIS — M7061 Trochanteric bursitis, right hip: Secondary | ICD-10-CM | POA: Diagnosis not present

## 2020-06-13 DIAGNOSIS — G8929 Other chronic pain: Secondary | ICD-10-CM | POA: Diagnosis not present

## 2020-06-13 DIAGNOSIS — M25551 Pain in right hip: Secondary | ICD-10-CM | POA: Diagnosis not present

## 2020-06-13 DIAGNOSIS — M533 Sacrococcygeal disorders, not elsewhere classified: Secondary | ICD-10-CM | POA: Diagnosis not present

## 2020-06-13 DIAGNOSIS — M5441 Lumbago with sciatica, right side: Secondary | ICD-10-CM | POA: Diagnosis not present

## 2020-06-15 ENCOUNTER — Other Ambulatory Visit: Payer: Self-pay

## 2020-06-15 ENCOUNTER — Ambulatory Visit
Admission: EM | Admit: 2020-06-15 | Discharge: 2020-06-15 | Disposition: A | Discharge status: Home / Self Care | Payer: Medicare Other | Attending: Family Medicine | Admitting: Family Medicine

## 2020-06-15 DIAGNOSIS — N3000 Acute cystitis without hematuria: Secondary | ICD-10-CM | POA: Diagnosis not present

## 2020-06-15 LAB — URINALYSIS, COMPLETE (UACMP) WITH MICROSCOPIC: WBC, UA: >50 WBC/hpf (ref 0–5)

## 2020-06-15 MED ORDER — CEFDINIR 300 MG PO CAPS: 300.0000 mg | ORAL_CAPSULE | Freq: Two times a day (BID) | ORAL | 0 refills | Status: DC

## 2020-06-15 MED ORDER — PHENAZOPYRIDINE HCL 100 MG PO TABS: 100.0000 mg | ORAL_TABLET | Freq: Three times a day (TID) | ORAL | 0 refills | Status: DC | PRN

## 2020-06-15 NOTE — ED Provider Notes (Signed)
[facility]    CSN: [id number] Arrival date & time: 06/15/20  0907      History   Chief Complaint Chief Complaint  Patient presents with  . Dysuria   HPI  87 year old female presents with urinary symptoms.  Patient has a history of recurrent UTI.  Reports that her symptoms started on Wednesday.  She reports dysuria, urinary frequency, and urgency.  Also reports nausea.  No abdominal pain.  No fever.  Denies hematuria.  Pain 8/10 in severity.  No relieving factors.  No other complaints.  Past Medical History:  Diagnosis Date  . Diverticulosis   . Fibrocystic breast disease   . GERD (gastroesophageal reflux disease)   . History of kidney stones   . Hypercholesterolemia   . Hypertension   . Hypertension   . IBS (irritable bowel syndrome)   . Inflammatory arthritis    positive anti-CCP abs, s/p prednisone, MTX, Remicade  . Nephrolithiasis   . Osteopenia    GI upset with Fosamax  . Pancreatitis 2007   s/p ERCP  . Pancreatitis   . PONV (postoperative nausea and vomiting)   . Renal insufficiency   . Spinal stenosis of lumbosacral region     Patient Active Problem List   Diagnosis Date Noted  . Right shoulder pain 02/19/2019  . Rash 02/19/2019  . MGUS (monoclonal gammopathy of unknown significance) 01/19/2018  . Cramps, extremity 12/02/2017  . Abdominal bruit 01/16/2017  . Lumbar stenosis 08/18/2016  . Abdominal pain 12/03/2015  . Abnormal liver function tests 10/18/2015  . Anemia 10/18/2015  . Hyponatremia 10/07/2015  . Lip lesion 04/23/2015  . Scalp lesion 04/23/2015  . Loss of weight 04/23/2015  . Stress 04/23/2015  . Left shoulder pain 12/20/2014  . UTI (urinary tract infection) 08/20/2014  . Health care maintenance 08/20/2014  . Skin lesion 04/17/2014  . Fatigue 04/13/2014  . Shoulder pain, right 12/17/2013  . Trigger finger 08/21/2013  . Left hip pain 04/17/2013  . GERD (gastroesophageal reflux disease) 04/03/2012  . Rheumatoid arthritis  ([place]) 03/30/2012  . Diverticulosis 03/30/2012  . Osteopenia 03/30/2012  . Hypertension 03/30/2012  . Hypercholesterolemia 03/30/2012    Past Surgical History:  Procedure Laterality Date  . ABDOMINAL HYSTERECTOMY  1993   ovaries not removed  . BREAST BIOPSY Left 1984   negative  . BREAST CYST EXCISION Left 2010   negative  . LITHOTRIPSY    . LUMBAR LAMINECTOMY  1983  . LUMBAR LAMINECTOMY/DECOMPRESSION MICRODISCECTOMY N/A 08/18/2016   Procedure: LUMBAR LAMINECTOMY/DECOMPRESSION MICRODISCECTOMY 1 LEVEL;  Surgeon: Bayard Hugger, MD;  Location: [facility] ORS;  Service: Neurosurgery;  Laterality: N/A;  L4-5 Laminectomy, MIS, L5 foraminotomy  . TRIGGER FINGER RELEASE     right ring finger    OB History   No obstetric history on file.      Home Medications    Prior to Admission medications   Medication Sig Start Date End Date Taking? Authorizing Provider  cefdinir (OMNICEF) 300 MG capsule Take 1 capsule (300 mg total) by mouth 2 (two) times daily. 06/15/20  Yes Cook, Jayce G, DO  celecoxib (CELEBREX) 100 MG capsule Take 100 mg by mouth 2 (two) times daily. 04/05/20  Yes [provider]  Cholecalciferol (VITAMIN D) 2000 UNITS tablet Take 2,000 Units by mouth daily.   Yes [provider]  clotrimazole-betamethasone (LOTRISONE) cream Apply 1 application topically 2 (two) times daily. 05/21/18  Yes Einar Pheasant, MD  diltiazem (DILACOR XR) 240 MG 24 hr capsule Take 1 capsule (240  mg total) by mouth daily. 01/10/20  Yes Einar Pheasant, MD  dorzolamide-timolol (COSOPT) 22.3-6.8 MG/ML ophthalmic solution INSTILL 1 DROP INTO EACH EYE TWICE DAILY 12/17/17  Yes [provider]  fluticasone (FLONASE) 50 MCG/ACT nasal spray USE TWO SPRAY(S) IN EACH NOSTRIL ONCE DAILY 01/14/17  Yes Einar Pheasant, MD  folic acid (FOLVITE) 1 MG tablet Take 1 mg by mouth 2 (two) times daily. 12/14/17  Yes [provider]  gabapentin (NEURONTIN) 300 MG capsule Take 1 capsule (300 mg total) by  mouth at bedtime. 01/10/20  Yes Einar Pheasant, MD  InFLIXimab (REMICADE IV) Inject into the vein every 6 (six) weeks. Per Dr Jefm Bryant   Yes [provider]  latanoprost (XALATAN) 0.005 % ophthalmic solution  01/18/18  Yes [provider]  losartan (COZAAR) 100 MG tablet TAKE 1 TABLET DAILY 04/19/20  Yes Einar Pheasant, MD  lovastatin (MEVACOR) 40 MG tablet Take 1 tablet (40 mg total) by mouth daily. 05/01/20  Yes Einar Pheasant, MD  methotrexate 25 MG/ML SOLN Inject 17.5 mg into the skin once a week. Patient takes 17.5 mg (0.7 ml) on Monday or Tuesday of each week.   Yes [provider]  omeprazole (PRILOSEC) 20 MG capsule TAKE 1 CAPSULE DAILY 02/02/20  Yes Einar Pheasant, MD  ondansetron (ZOFRAN) 4 MG tablet Take 1 tablet (4 mg total) by mouth every 6 (six) hours as needed for nausea or vomiting. 08/19/16  Yes Bayard Hugger, MD  phenazopyridine (PYRIDIUM) 100 MG tablet Take 1 tablet (100 mg total) by mouth 3 (three) times daily as needed for pain. 06/15/20  Yes Cook, Jayce G, DO  polyethylene glycol (MIRALAX / GLYCOLAX) packet Take 17 g by mouth daily.   Yes [provider]  Probiotic Product (ALIGN) 4 MG CAPS Take one capsule daily 03/11/19  Yes Einar Pheasant, MD  psyllium (METAMUCIL) 58.6 % packet Take 1 packet by mouth daily.   Yes [provider]  triamcinolone cream (KENALOG) 0.1 % Apply 1 application topically 2 (two) times daily. 02/18/19  Yes Einar Pheasant, MD  triamterene-hydrochlorothiazide (MAXZIDE-25) 37.5-25 MG tablet TAKE 1 TABLET DAILY 06/27/19  Yes Einar Pheasant, MD  zoledronic acid (RECLAST) 5 MG/100ML SOLN injection Inject 5 mg into the vein once.   Yes [provider]    Family History Family History  Problem Relation Age of Onset  . Ovarian cancer Mother   . Renal Disease Mother   . Heart disease Mother   . Diabetes Mother   . Hypertension Mother   . Heart disease Father   . Breast cancer Neg Hx     Social  History Social History   Tobacco Use  . Smoking status: Never Smoker  . Smokeless tobacco: Never Used  Vaping Use  . Vaping Use: Never used  Substance Use Topics  . Alcohol use: No    Alcohol/week: 0.0 standard drinks  . Drug use: No     Allergies   Carisoprodol, Metronidazole, Codeine, Nsaids, Skelaxin [metaxalone], Tenormin [atenolol], and Tramadol   Review of Systems Review of Systems  Constitutional: Negative for fever.  Gastrointestinal: Positive for nausea.  Genitourinary: Positive for dysuria, frequency and urgency.   Physical Exam Triage Vital Signs ED Triage Vitals  Enc Vitals Group     BP 06/15/20 0920 (!) 158/64     Pulse Rate 06/15/20 0920 64     Resp 06/15/20 0920 18     Temp 06/15/20 0920 98 F (36.7 C)     Temp Source 06/15/20 0920  Oral     SpO2 06/15/20 0920 100 %     Weight 06/15/20 0918 111 lb (50.3 kg)     Height 06/15/20 0918 5\' 1"  (1.549 m)     Head Circumference --      Peak Flow --      Pain Score 06/15/20 0918 8     Pain Loc --      Pain Edu? --      Excl. in [place]? --    Updated Vital Signs BP (!) 158/64 (BP Location: Left Arm)   Pulse 64   Temp 98 F (36.7 C) (Oral)   Resp 18   Ht 5\' 1"  (1.549 m)   Wt 50.3 kg   SpO2 100%   BMI 20.97 kg/m   Visual Acuity Right Eye Distance:   Left Eye Distance:   Bilateral Distance:    Right Eye Near:   Left Eye Near:    Bilateral Near:     Physical Exam Vitals and nursing note reviewed.  Constitutional:      General: She is not in acute distress.    Appearance: Normal appearance. She is not ill-appearing.  HENT:     Head: Normocephalic and atraumatic.  Eyes:     General:        Right eye: No discharge.        Left eye: No discharge.     Conjunctiva/sclera: Conjunctivae normal.  Cardiovascular:     Rate and Rhythm: Normal rate and regular rhythm.  Pulmonary:     Effort: Pulmonary effort is normal.     Breath sounds: Normal breath sounds. No wheezing, rhonchi or rales.  Abdominal:      General: There is no distension.     Palpations: Abdomen is soft.     Tenderness: There is no abdominal tenderness.  Neurological:     Mental Status: She is alert.  Psychiatric:        Mood and Affect: Mood normal.        Behavior: Behavior normal.    UC Treatments / Results  Labs (all labs ordered are listed, but only abnormal results are displayed) Labs Reviewed  URINALYSIS, COMPLETE (UACMP) WITH MICROSCOPIC - Abnormal; Notable for the following components:      Result Value   Color, Urine ORANGE (*)    APPearance CLOUDY (*)    Glucose, UA   (*)    Value: TEST NOT REPORTED DUE TO COLOR INTERFERENCE OF URINE PIGMENT   Hgb urine dipstick   (*)    Value: TEST NOT REPORTED DUE TO COLOR INTERFERENCE OF URINE PIGMENT   Bilirubin Urine   (*)    Value: TEST NOT REPORTED DUE TO COLOR INTERFERENCE OF URINE PIGMENT   Ketones, ur   (*)    Value: TEST NOT REPORTED DUE TO COLOR INTERFERENCE OF URINE PIGMENT   Protein, ur   (*)    Value: TEST NOT REPORTED DUE TO COLOR INTERFERENCE OF URINE PIGMENT   Nitrite   (*)    Value: TEST NOT REPORTED DUE TO COLOR INTERFERENCE OF URINE PIGMENT   Leukocytes,Ua   (*)    Value: TEST NOT REPORTED DUE TO COLOR INTERFERENCE OF URINE PIGMENT   Bacteria, UA MANY (*)    All other components within normal limits  URINE CULTURE    EKG   Radiology No results found.  Procedures Procedures (including critical care time)  Medications Ordered in UC Medications - No data to display  Initial Impression / Assessment  and Plan / UC Course  I have reviewed the triage vital signs and the nursing notes.  Pertinent labs & imaging results that were available during my care of the patient were reviewed by me and considered in my medical decision making (see chart for details).    87 year old female presents with UTI.  This is a recurrent problem for the patient.  Pyuria and bacteria on microscopy.  Sending culture.  Placing on Omnicef and Pyridium.  Final  Clinical Impressions(s) / UC Diagnoses   Final diagnoses:  Acute cystitis without hematuria   Discharge Instructions   None    ED Prescriptions    Medication Sig Dispense Auth. Provider   cefdinir (OMNICEF) 300 MG capsule Take 1 capsule (300 mg total) by mouth 2 (two) times daily. 14 capsule Cook, Jayce G, DO   phenazopyridine (PYRIDIUM) 100 MG tablet Take 1 tablet (100 mg total) by mouth 3 (three) times daily as needed for pain. 10 tablet Coral Spikes, DO     PDMP not reviewed this encounter.   Coral Spikes, [place] 06/15/20 1007

## 2020-06-15 NOTE — ED Triage Notes (Signed)
Pt c/o pain and burning with urination, urinary frequency and nausea that has been increasing over the past few days. Pt also reports body aches since yesterday. Pt denies fever, abdominal pain, hematuria or other symptoms.

## 2020-06-17 LAB — URINE CULTURE: Culture: >=100000 — AB

## 2020-06-18 ENCOUNTER — Other Ambulatory Visit: Payer: Self-pay | Admitting: Internal Medicine

## 2020-07-06 ENCOUNTER — Other Ambulatory Visit: Payer: Self-pay | Admitting: Internal Medicine

## 2020-07-06 DIAGNOSIS — M7061 Trochanteric bursitis, right hip: Secondary | ICD-10-CM | POA: Diagnosis not present

## 2020-07-06 DIAGNOSIS — M1711 Unilateral primary osteoarthritis, right knee: Secondary | ICD-10-CM | POA: Diagnosis not present

## 2020-07-06 DIAGNOSIS — K219 Gastro-esophageal reflux disease without esophagitis: Secondary | ICD-10-CM

## 2020-07-06 DIAGNOSIS — M25561 Pain in right knee: Secondary | ICD-10-CM | POA: Diagnosis not present

## 2020-07-06 DIAGNOSIS — M5441 Lumbago with sciatica, right side: Secondary | ICD-10-CM | POA: Diagnosis not present

## 2020-07-06 DIAGNOSIS — G8929 Other chronic pain: Secondary | ICD-10-CM | POA: Diagnosis not present

## 2020-07-06 DIAGNOSIS — M533 Sacrococcygeal disorders, not elsewhere classified: Secondary | ICD-10-CM | POA: Diagnosis not present

## 2020-07-09 ENCOUNTER — Telehealth: Payer: Self-pay

## 2020-07-09 NOTE — Telephone Encounter (Signed)
Patient refused to go to ED/UC tonight. She does not have a ride, and she refuses to call ambulance. She stated she is feeling fine right now and she is going to go lay down on the couch and will see PCP tomorrow. Patient stated she is unsure of the blood because she took pepto and that makes her stool black. She is feeling alittle weak.

## 2020-07-09 NOTE — Telephone Encounter (Signed)
Patient has appointment with PCP tomorrow.  [facility] RECORD [facility] Patient Name: Stacey Snyder Gender: Female DOB: March 07, 1933 Age: 87 Y 75 M Return Phone Number: [phone] (Primary) Address: City/State/Zip: [place] [address] Client [facility] Client [facility] [place] - Day Physician Einar Pheasant - MD Contact Type Call Who Is Calling Patient / Member / Family / Caregiver Call Type Triage / Clinical Relationship To Patient Self Return Phone Number [phone] (Primary) Chief Complaint Blood In Stool Reason for Call Symptomatic / Request for [place] has diarrhea and possible blood in stool. She recently took an antibiotic for a UTI. Her husband recently passed away. Translation No Nurse Assessment Nurse: Windle Guard, RN, Lesa Date/Time (Eastern Time): 07/09/2020 1:38:08 PM Confirm and document reason for call. If symptomatic, describe symptoms. ---Caller states she has diarrhea. She has been using peptobismol. She is weak and she took an Imodium Does the patient have any new or worsening symptoms? ---Yes Will a triage be completed? ---Yes Related visit to physician within the last 2 weeks? ---No Does the PT have any chronic conditions? (i.e. diabetes, asthma, this includes High risk factors for pregnancy, etc.) ---Yes List chronic conditions. ---diverticulitis, HTN Is this a behavioral health or substance abuse call? ---No Guidelines Guideline Title Affirmed Question Affirmed Notes Nurse Date/Time (Eastern Time) Diarrhea [1] MODERATE diarrhea (e.g., 4-6 times / day more than normal) AND [2] present > 48 hours (2 days) Windle Guard, RN, Lesa 07/09/2020 1:42:13 PM Disp. Time Eilene Ghazi Time) Disposition Final User 07/09/2020 1:48:26 PM See PCP within 24 Hours Yes Conner, RN, Lesa PLEASE NOTE: All timestamps  contained within this report are represented as [place] Standard Time. CONFIDENTIALTY NOTICE: This fax transmission is intended only for the addressee. It contains information that is legally privileged, confidential or otherwise protected from use or disclosure. If you are not the intended recipient, you are strictly prohibited from reviewing, disclosing, copying using or disseminating any of this information or taking any action in reliance on or regarding this information. If you have received this fax in error, please notify us immediately by telephone so that we can arrange for its return to [place]. Phone: [phone], Toll-Free: [phone], Fax: [phone] Page: 2 of 2 Call Id: [id number] Caller Disagree/Comply Comply Caller Understands Yes PreDisposition Did not know what to do Care Advice Given Per Guideline SEE PCP WITHIN 24 HOURS: * IF OFFICE WILL BE OPEN: You need to be examined within the next 24 hours. Call your doctor (or NP/PA) when the office opens and make an appointment. FLUID THERAPY DURING MILD TO MODERATE DIARRHEA: * Drink more fluids, at least 8 to 10 cups daily. One cup equals 8 oz (240 ml). * WATER: For mild to moderate diarrhea, water is often the best liquid to drink. You should also eat some salty foods (e.g., potato chips, pretzels, saltine crackers). This is important to make sure you are getting enough salt, sugars, and fluids to meet your body's needs. * SPORTS DRINKS: You can also drink halfstrength sports drinks (e.g., Gatorade, Powerade) to help treat and prevent dehydration. Mix the sports drink half and half with water. * Avoid caffeinated beverages. Reason: Caffeine is mildly dehydrating. * Avoid alcohol beverages (e.g., beer, wine, hard liquor). * Avoid carbonated soft drinks (soda) as these can make your diarrhea worse. FOOD AND NUTRITION DURING MILD TO MODERATE DIARRHEA: * Maintaining some food intake during episodes of diarrhea  is important. * Begin with  boiled starches / cereals (e.g., potatoes, rice, noodles, wheat, oats) with a small amount of salt to taste. * AVOID milk and dairy products if these make your diarrhea worse. CONTAGIOUSNESS: * Wash your hands after using the bathroom. * Wash your hands before fixing or eating food. * Wash soiled towels, sheets, or clothes separately. * Do not share towels or sheets. * Do not swim for 2 weeks after diarrhea is gone. CALL BACK IF: * Signs of dehydration occur (e.g., no urine over 12 hours, very dry mouth, lightheaded, etc.) * Bloody stools * Constant or severe abdomen pain * You become worse CARE ADVICE given per Diarrhea (Adult) guideline. Comments User: Starleen Blue, RN Date/Time Eilene Ghazi Time): 07/09/2020 1:48:01 PM Caller states she has an appt tomorrow Referrals REFERRED TO PCP OFFICE

## 2020-07-09 NOTE — Telephone Encounter (Signed)
If she is weak and passing blood in stool, she needs to be seen this pm.

## 2020-07-09 NOTE — Telephone Encounter (Signed)
See attached - for information for appt.

## 2020-07-10 ENCOUNTER — Other Ambulatory Visit: Payer: Self-pay

## 2020-07-10 ENCOUNTER — Ambulatory Visit (INDEPENDENT_AMBULATORY_CARE_PROVIDER_SITE_OTHER): Payer: Medicare Other | Admitting: Internal Medicine

## 2020-07-10 VITALS — BP 138/76 | HR 72 | Temp 97.6°F | Resp 16 | Ht 61.0 in | Wt 104.0 lb

## 2020-07-10 DIAGNOSIS — R110 Nausea: Secondary | ICD-10-CM | POA: Diagnosis not present

## 2020-07-10 DIAGNOSIS — M069 Rheumatoid arthritis, unspecified: Secondary | ICD-10-CM

## 2020-07-10 DIAGNOSIS — R945 Abnormal results of liver function studies: Secondary | ICD-10-CM | POA: Diagnosis not present

## 2020-07-10 DIAGNOSIS — R197 Diarrhea, unspecified: Secondary | ICD-10-CM

## 2020-07-10 DIAGNOSIS — K5790 Diverticulosis of intestine, part unspecified, without perforation or abscess without bleeding: Secondary | ICD-10-CM | POA: Diagnosis not present

## 2020-07-10 DIAGNOSIS — E871 Hypo-osmolality and hyponatremia: Secondary | ICD-10-CM | POA: Diagnosis not present

## 2020-07-10 DIAGNOSIS — R634 Abnormal weight loss: Secondary | ICD-10-CM

## 2020-07-10 DIAGNOSIS — I10 Essential (primary) hypertension: Secondary | ICD-10-CM | POA: Diagnosis not present

## 2020-07-10 DIAGNOSIS — D649 Anemia, unspecified: Secondary | ICD-10-CM

## 2020-07-10 DIAGNOSIS — F439 Reaction to severe stress, unspecified: Secondary | ICD-10-CM

## 2020-07-10 DIAGNOSIS — R7989 Other specified abnormal findings of blood chemistry: Secondary | ICD-10-CM

## 2020-07-10 LAB — CBC WITH DIFFERENTIAL/PLATELET
Basophils Absolute: 0 10*3/uL (ref 0.0–0.1)
Basophils Relative: 0.4 % (ref 0.0–3.0)
Eosinophils Absolute: 0.1 10*3/uL (ref 0.0–0.7)
Eosinophils Relative: 1 % (ref 0.0–5.0)
HCT: 34.1 % — ABNORMAL LOW (ref 36.0–46.0)
Hemoglobin: 11.6 g/dL — ABNORMAL LOW (ref 12.0–15.0)
Lymphocytes Relative: 23.5 % (ref 12.0–46.0)
Lymphs Abs: 1.2 10*3/uL (ref 0.7–4.0)
MCHC: 33.9 g/dL (ref 30.0–36.0)
MCV: 92.3 fl (ref 78.0–100.0)
Monocytes Absolute: 0.1 10*3/uL (ref 0.1–1.0)
Monocytes Relative: 2.8 % — ABNORMAL LOW (ref 3.0–12.0)
Neutro Abs: 3.7 10*3/uL (ref 1.4–7.7)
Neutrophils Relative %: 72.3 % (ref 43.0–77.0)
Platelets: 499 10*3/uL — ABNORMAL HIGH (ref 150.0–400.0)
RBC: 3.69 Mil/uL — ABNORMAL LOW (ref 3.87–5.11)
RDW: 13.6 % (ref 11.5–15.5)
WBC: 5.2 10*3/uL (ref 4.0–10.5)

## 2020-07-10 LAB — BASIC METABOLIC PANEL
BUN: 33 mg/dL — ABNORMAL HIGH (ref 6–23)
CO2: 28 mEq/L (ref 19–32)
Calcium: 9 mg/dL (ref 8.4–10.5)
Chloride: 92 mEq/L — ABNORMAL LOW (ref 96–112)
Creatinine, Ser: 1.06 mg/dL (ref 0.40–1.20)
GFR: 47.99 mL/min — ABNORMAL LOW (ref >60.00)
Glucose, Bld: 87 mg/dL (ref 70–99)
Potassium: 4 mEq/L (ref 3.5–5.1)
Sodium: 128 mEq/L — ABNORMAL LOW (ref 135–145)

## 2020-07-10 LAB — HEPATIC FUNCTION PANEL
ALT: 48 U/L — ABNORMAL HIGH (ref 0–35)
AST: 31 U/L (ref 0–37)
Albumin: 3.6 g/dL (ref 3.5–5.2)
Alkaline Phosphatase: 93 U/L (ref 39–117)
Bilirubin, Direct: 0.2 mg/dL (ref 0.0–0.3)
Total Bilirubin: 0.7 mg/dL (ref 0.2–1.2)
Total Protein: 6.2 g/dL (ref 6.0–8.3)

## 2020-07-10 NOTE — Progress Notes (Addendum)
Patient ID: Cortlyn Cannell, female   DOB: 1932/11/04, 87 y.o.   MRN: [id number]   Subjective:    Patient ID: [name], female    DOB: 1932-11-19, 87 y.o.   MRN: [id number]  HPI This visit occurred during the SARS-CoV-2 public health emergency.  Safety protocols were in place, including screening questions prior to the visit, additional usage of staff PPE, and extensive cleaning of exam room while observing appropriate contact time as indicated for disinfecting solutions.  Patient here for a scheduled follow up.  Scheduled to follow up regarding her blood pressure.  She reports she has not been feeling well.  Her husband passed 06/23/20.  Increased stress related to this.  She was diagnosed with UTI 06/15/20.  Treated with omnicef.  Started having diarrhea.  Previously on probiotic.  Diarrhea present for approximately two weeks.  Previous nausea.  None now.  No vomiting.  Is eating better. Feels better today.  Drinking ensure.  Has been taking pepto bismol.  Also took imodium yesterday am.  No diarrhea since.  No abdominal pain.  Noticed dark stool after taking pepto bismol.  No BRB.  No fever.  No cough or congestion.  No sob.    Past Medical History:  Diagnosis Date  . Diverticulosis   . Fibrocystic breast disease   . GERD (gastroesophageal reflux disease)   . History of kidney stones   . Hypercholesterolemia   . Hypertension   . Hypertension   . IBS (irritable bowel syndrome)   . Inflammatory arthritis    positive anti-CCP abs, s/p prednisone, MTX, Remicade  . Nephrolithiasis   . Osteopenia    GI upset with Fosamax  . Pancreatitis 2007   s/p ERCP  . Pancreatitis   . PONV (postoperative nausea and vomiting)   . Renal insufficiency   . Spinal stenosis of lumbosacral region    Past Surgical History:  Procedure Laterality Date  . ABDOMINAL HYSTERECTOMY  1993   ovaries not removed  . BREAST BIOPSY Left 1984   negative  . BREAST CYST EXCISION Left 2010    negative  . LITHOTRIPSY    . LUMBAR LAMINECTOMY  1983  . LUMBAR LAMINECTOMY/DECOMPRESSION MICRODISCECTOMY N/A 08/18/2016   Procedure: LUMBAR LAMINECTOMY/DECOMPRESSION MICRODISCECTOMY 1 LEVEL;  Surgeon: Bayard Hugger, MD;  Location: [facility] ORS;  Service: Neurosurgery;  Laterality: N/A;  L4-5 Laminectomy, MIS, L5 foraminotomy  . TRIGGER FINGER RELEASE     right ring finger   Family History  Problem Relation Age of Onset  . Ovarian cancer Mother   . Renal Disease Mother   . Heart disease Mother   . Diabetes Mother   . Hypertension Mother   . Heart disease Father   . Breast cancer Neg Hx    Social History   Socioeconomic History  . Marital status: Married    Spouse name: Not on file  . Number of children: 4  . Years of education: Not on file  . Highest education level: Not on file  Occupational History  . Not on file  Tobacco Use  . Smoking status: Never Smoker  . Smokeless tobacco: Never Used  Vaping Use  . Vaping Use: Never used  Substance and Sexual Activity  . Alcohol use: No    Alcohol/week: 0.0 standard drinks  . Drug use: No  . Sexual activity: Not on file  Other Topics Concern  . Not on file  Social History Narrative  . Not on file   Social Determinants of Health  Financial Resource Strain: Not on file  Food Insecurity: Not on file  Transportation Needs: Not on file  Physical Activity: Not on file  Stress: Not on file  Social Connections: Not on file    Outpatient Encounter Medications as of 07/10/2020  Medication Sig  . celecoxib (CELEBREX) 100 MG capsule Take 100 mg by mouth 2 (two) times daily.  . Cholecalciferol (VITAMIN D) 2000 UNITS tablet Take 2,000 Units by mouth daily.  . clotrimazole-betamethasone (LOTRISONE) cream Apply 1 application topically 2 (two) times daily.  Marland Kitchen DILT-XR 240 MG 24 hr capsule TAKE 1 CAPSULE DAILY  . dorzolamide-timolol (COSOPT) 22.3-6.8 MG/ML ophthalmic solution INSTILL 1 DROP INTO EACH EYE TWICE DAILY  . fluticasone (FLONASE)  50 MCG/ACT nasal spray USE TWO SPRAY(S) IN EACH NOSTRIL ONCE DAILY  . folic acid (FOLVITE) 1 MG tablet Take 1 mg by mouth 2 (two) times daily.  Marland Kitchen gabapentin (NEURONTIN) 300 MG capsule TAKE 1 CAPSULE AT BEDTIME  . InFLIXimab (REMICADE IV) Inject into the vein every 6 (six) weeks. Per Dr Jefm Bryant  . latanoprost (XALATAN) 0.005 % ophthalmic solution   . losartan (COZAAR) 100 MG tablet TAKE 1 TABLET DAILY  . lovastatin (MEVACOR) 40 MG tablet Take 1 tablet (40 mg total) by mouth daily.  . methotrexate 25 MG/ML SOLN Inject 17.5 mg into the skin once a week. Patient takes 17.5 mg (0.7 ml) on Monday or Tuesday of each week.  Marland Kitchen omeprazole (PRILOSEC) 20 MG capsule TAKE 1 CAPSULE DAILY  . ondansetron (ZOFRAN) 4 MG tablet Take 1 tablet (4 mg total) by mouth every 6 (six) hours as needed for nausea or vomiting.  . phenazopyridine (PYRIDIUM) 100 MG tablet Take 1 tablet (100 mg total) by mouth 3 (three) times daily as needed for pain.  . polyethylene glycol (MIRALAX / GLYCOLAX) packet Take 17 g by mouth daily.  . Probiotic Product (ALIGN) 4 MG CAPS Take one capsule daily  . psyllium (METAMUCIL) 58.6 % packet Take 1 packet by mouth daily.  Marland Kitchen triamcinolone cream (KENALOG) 0.1 % Apply 1 application topically 2 (two) times daily.  Marland Kitchen triamterene-hydrochlorothiazide (MAXZIDE-25) 37.5-25 MG tablet TAKE 1 TABLET DAILY  . zoledronic acid (RECLAST) 5 MG/100ML SOLN injection Inject 5 mg into the vein once.  . [DISCONTINUED] cefdinir (OMNICEF) 300 MG capsule Take 1 capsule (300 mg total) by mouth 2 (two) times daily.   No facility-administered encounter medications on file as of 07/10/2020.    Review of Systems  Constitutional: Positive for fatigue.       Weight loss.  Eating better now.  Previous decreased appetite.  Drinking ensure.    HENT: Negative for congestion and sinus pressure.   Respiratory: Negative for cough, chest tightness and shortness of breath.   Cardiovascular: Negative for chest pain, palpitations  and leg swelling.  Gastrointestinal: Positive for diarrhea. Negative for abdominal pain.       No nausea or vomiting currently.    Genitourinary: Negative for difficulty urinating and dysuria.       Previous urinary symptoms resolved.   Musculoskeletal: Negative for gait problem and myalgias.  Skin: Negative for color change and rash.  Neurological: Negative for dizziness, light-headedness and headaches.  Psychiatric/Behavioral: Negative for agitation.       Increased stress as outlined.        Objective:    Physical Exam Vitals reviewed.  Constitutional:      General: She is not in acute distress.    Appearance: Normal appearance.  HENT:  Head: Normocephalic and atraumatic.     Right Ear: External ear normal.     Left Ear: External ear normal.     Mouth/Throat:     Mouth: Oropharynx is clear and moist.  Eyes:     General: No scleral icterus.       Right eye: No discharge.        Left eye: No discharge.     Conjunctiva/sclera: Conjunctivae normal.  Neck:     Thyroid: No thyromegaly.  Cardiovascular:     Rate and Rhythm: Normal rate and regular rhythm.  Pulmonary:     Effort: No respiratory distress.     Breath sounds: Normal breath sounds. No wheezing.  Abdominal:     General: Bowel sounds are normal.     Palpations: Abdomen is soft.     Tenderness: There is no abdominal tenderness.  Genitourinary:    Comments: Declined rectal exam.  Musculoskeletal:        General: No swelling, tenderness or edema.     Cervical back: Neck supple. No tenderness.  Lymphadenopathy:     Cervical: No cervical adenopathy.  Skin:    Findings: No erythema or rash.  Neurological:     Mental Status: She is alert.  Psychiatric:        Mood and Affect: Mood normal.        Behavior: Behavior normal.     BP 138/76   Pulse 72   Temp 97.6 F (36.4 C) (Oral)   Resp 16   Ht 5\' 1"  (1.549 m)   Wt 104 lb (47.2 kg)   SpO2 98%   BMI 19.65 kg/m  Wt Readings from Last 3 Encounters:   07/10/20 104 lb (47.2 kg)  06/15/20 111 lb (50.3 kg)  04/17/20 112 lb 12.8 oz (51.2 kg)     Lab Results  Component Value Date   WBC 5.2 07/10/2020   HGB 11.6 (L) 07/10/2020   HCT 34.1 (L) 07/10/2020   PLT 499.0 (H) 07/10/2020   GLUCOSE 87 07/10/2020   CHOL 195 04/17/2020   TRIG 78.0 04/17/2020   HDL 96.30 04/17/2020   LDLCALC 83 04/17/2020   ALT 48 (H) 07/10/2020   AST 31 07/10/2020   NA 128 (L) 07/10/2020   K 4.0 07/10/2020   CL 92 (L) 07/10/2020   CREATININE 1.06 07/10/2020   BUN 33 (H) 07/10/2020   CO2 28 07/10/2020   TSH 1.74 04/17/2020   INR 0.92 08/07/2016       Assessment & Plan:   Problem List Items Addressed This Visit    Abnormal liver function tests    Recheck liver panel today.       Relevant Orders   Hepatic function panel (Completed)   Anemia    Recheck cbc today.  She declines rectal exam.  Agreed to hemoccult cards.  Recently took pepto bismol.  Could have caused - black stool.        Relevant Orders   CBC with Differential/Platelet (Completed)   Diarrhea    Persistent diarrhea as outlined.  Was treated recently for UTI.  Check stool studies.  Discussed possible c.diff.  Probiotics.  Follow.       Relevant Orders   Gastrointestinal Pathogen Panel PCR   Fecal occult blood, imunochemical   Diverticulosis    Denies any abdominal pain.  Recent bowel issue does not feel similar to diverticular flare.  No diarrhea now per pt.  Follow.       Hypertension - Primary  Blood pressure as outlined.  On losartan and triam/hctz.  Follow pressures.  Will continue current medication for now.  She is eating better.  Diarrhea per her report resolved.  Check metabolic panel.       Relevant Orders   Basic metabolic panel (Completed)   Hyponatremia    Previous problems with low sodium.  Given recent diarrhea, decreased po intake and she reported she drank an increased amount of free water yesterday.  Check metabolic panel today.        Loss of weight     Weight loss with recent stress and diarrhea.  She is eating better now.  Has started ensure supplements.  Follow.        Rheumatoid arthritis ([place])    Followed by Dr Jefm Bryant.  Stable. Receiving remicade injections.       Stress    Increased stress as outlined.  Discussed.  Has good support.  Does not feel needs any further intervention at this time.  Follow.           Addendum.  Pt with weight loss. Persistent low sodium.  Improved on most recent check, but still decreased.  Called pt.  Discussed low sodium.  Off triam/hctz.  Remain off for now.  Decreased appetite.  Persistent nausea.  Weight loss.  Discussed the need for further w/up.  Previous history of presumed benign cystic lesions of pancreas.  Given having to take care of her husband, etc, she had desires no further testing previously.  Discussed today.  She did agree with CT scan to further evaluate.    Dr Nicki Reaper Einar Pheasant, MD

## 2020-07-10 NOTE — Telephone Encounter (Signed)
LMTCB to confirm nothing more acute going on

## 2020-07-11 ENCOUNTER — Other Ambulatory Visit
Admission: RE | Admit: 2020-07-11 | Discharge: 2020-07-11 | Disposition: A | Discharge status: Home / Self Care | Payer: Medicare Other | Source: Ambulatory Visit | Attending: Internal Medicine | Admitting: Internal Medicine

## 2020-07-11 ENCOUNTER — Other Ambulatory Visit: Payer: Self-pay | Admitting: Internal Medicine

## 2020-07-11 ENCOUNTER — Encounter: Payer: Self-pay | Admitting: Internal Medicine

## 2020-07-11 DIAGNOSIS — R197 Diarrhea, unspecified: Secondary | ICD-10-CM | POA: Insufficient documentation

## 2020-07-11 DIAGNOSIS — R7989 Other specified abnormal findings of blood chemistry: Secondary | ICD-10-CM

## 2020-07-11 DIAGNOSIS — D649 Anemia, unspecified: Secondary | ICD-10-CM

## 2020-07-11 DIAGNOSIS — R945 Abnormal results of liver function studies: Secondary | ICD-10-CM

## 2020-07-11 DIAGNOSIS — E871 Hypo-osmolality and hyponatremia: Secondary | ICD-10-CM

## 2020-07-11 LAB — GASTROINTESTINAL PANEL BY PCR, STOOL (REPLACES STOOL CULTURE)

## 2020-07-11 LAB — OCCULT BLOOD X 1 CARD TO LAB, STOOL: Fecal Occult Bld: NEGATIVE

## 2020-07-11 NOTE — Assessment & Plan Note (Signed)
Previous problems with low sodium.  Given recent diarrhea, decreased po intake and she reported she drank an increased amount of free water yesterday.  Check metabolic panel today.

## 2020-07-11 NOTE — Assessment & Plan Note (Signed)
Increased stress as outlined.  Discussed. Has good support.  Does not feel needs any further intervention at this time.  Follow.  

## 2020-07-11 NOTE — Assessment & Plan Note (Signed)
Followed by Dr Jefm Bryant.  Stable. Receiving remicade injections.

## 2020-07-11 NOTE — Assessment & Plan Note (Signed)
Denies any abdominal pain.  Recent bowel issue does not feel similar to diverticular flare.  No diarrhea now per pt.  Follow.

## 2020-07-11 NOTE — Assessment & Plan Note (Signed)
Recheck liver panel today.  

## 2020-07-11 NOTE — Assessment & Plan Note (Signed)
Blood pressure as outlined.  On losartan and triam/hctz.  Follow pressures.  Will continue current medication for now.  She is eating better.  Diarrhea per her report resolved.  Check metabolic panel.

## 2020-07-11 NOTE — Progress Notes (Signed)
Orders placed for f/u labs.  

## 2020-07-11 NOTE — Assessment & Plan Note (Signed)
Persistent diarrhea as outlined.  Was treated recently for UTI.  Check stool studies.  Discussed possible c.diff.  Probiotics.  Follow.

## 2020-07-11 NOTE — Assessment & Plan Note (Signed)
Recheck cbc today.  She declines rectal exam.  Agreed to hemoccult cards.  Recently took pepto bismol.  Could have caused - black stool.

## 2020-07-11 NOTE — Assessment & Plan Note (Signed)
Weight loss with recent stress and diarrhea.  She is eating better now.  Has started ensure supplements.  Follow.

## 2020-07-12 ENCOUNTER — Other Ambulatory Visit: Payer: Self-pay

## 2020-07-12 ENCOUNTER — Other Ambulatory Visit (INDEPENDENT_AMBULATORY_CARE_PROVIDER_SITE_OTHER): Payer: Medicare Other

## 2020-07-12 DIAGNOSIS — N6019 Diffuse cystic mastopathy of unspecified breast: Secondary | ICD-10-CM | POA: Diagnosis not present

## 2020-07-12 DIAGNOSIS — K589 Irritable bowel syndrome without diarrhea: Secondary | ICD-10-CM | POA: Diagnosis not present

## 2020-07-12 DIAGNOSIS — K5790 Diverticulosis of intestine, part unspecified, without perforation or abscess without bleeding: Secondary | ICD-10-CM | POA: Diagnosis not present

## 2020-07-12 DIAGNOSIS — R197 Diarrhea, unspecified: Secondary | ICD-10-CM | POA: Diagnosis not present

## 2020-07-12 DIAGNOSIS — I10 Essential (primary) hypertension: Secondary | ICD-10-CM | POA: Diagnosis not present

## 2020-07-12 DIAGNOSIS — M8580 Other specified disorders of bone density and structure, unspecified site: Secondary | ICD-10-CM | POA: Diagnosis not present

## 2020-07-12 DIAGNOSIS — D649 Anemia, unspecified: Secondary | ICD-10-CM

## 2020-07-12 DIAGNOSIS — R945 Abnormal results of liver function studies: Secondary | ICD-10-CM | POA: Diagnosis not present

## 2020-07-12 DIAGNOSIS — M069 Rheumatoid arthritis, unspecified: Secondary | ICD-10-CM | POA: Diagnosis not present

## 2020-07-12 DIAGNOSIS — E871 Hypo-osmolality and hyponatremia: Secondary | ICD-10-CM | POA: Diagnosis not present

## 2020-07-12 DIAGNOSIS — E785 Hyperlipidemia, unspecified: Secondary | ICD-10-CM | POA: Diagnosis not present

## 2020-07-12 DIAGNOSIS — Z7951 Long term (current) use of inhaled steroids: Secondary | ICD-10-CM | POA: Diagnosis not present

## 2020-07-12 DIAGNOSIS — M5441 Lumbago with sciatica, right side: Secondary | ICD-10-CM | POA: Diagnosis not present

## 2020-07-12 DIAGNOSIS — R7989 Other specified abnormal findings of blood chemistry: Secondary | ICD-10-CM

## 2020-07-12 DIAGNOSIS — M461 Sacroiliitis, not elsewhere classified: Secondary | ICD-10-CM | POA: Diagnosis not present

## 2020-07-12 DIAGNOSIS — G8929 Other chronic pain: Secondary | ICD-10-CM | POA: Diagnosis not present

## 2020-07-12 DIAGNOSIS — M7061 Trochanteric bursitis, right hip: Secondary | ICD-10-CM | POA: Diagnosis not present

## 2020-07-12 LAB — IBC + FERRITIN
Ferritin: 498.1 ng/mL — ABNORMAL HIGH (ref 10.0–291.0)
Iron: 41 ug/dL — ABNORMAL LOW (ref 42–145)
Saturation Ratios: 14.4 % — ABNORMAL LOW (ref 20.0–50.0)
Transferrin: 204 mg/dL — ABNORMAL LOW (ref 212.0–360.0)

## 2020-07-12 LAB — BASIC METABOLIC PANEL
BUN: 27 mg/dL — ABNORMAL HIGH (ref 6–23)
CO2: 28 mEq/L (ref 19–32)
Calcium: 8.9 mg/dL (ref 8.4–10.5)
Chloride: 93 mEq/L — ABNORMAL LOW (ref 96–112)
Creatinine, Ser: 1.19 mg/dL (ref 0.40–1.20)
GFR: 41.77 mL/min — ABNORMAL LOW (ref >60.00)
Glucose, Bld: 138 mg/dL — ABNORMAL HIGH (ref 70–99)
Potassium: 3.4 mEq/L — ABNORMAL LOW (ref 3.5–5.1)
Sodium: 128 mEq/L — ABNORMAL LOW (ref 135–145)

## 2020-07-12 LAB — HEPATIC FUNCTION PANEL
ALT: 32 U/L (ref 0–35)
AST: 20 U/L (ref 0–37)
Albumin: 3.5 g/dL (ref 3.5–5.2)
Alkaline Phosphatase: 91 U/L (ref 39–117)
Bilirubin, Direct: 0.1 mg/dL (ref 0.0–0.3)
Total Bilirubin: 0.3 mg/dL (ref 0.2–1.2)
Total Protein: 6.6 g/dL (ref 6.0–8.3)

## 2020-07-13 ENCOUNTER — Other Ambulatory Visit: Payer: Self-pay | Admitting: Internal Medicine

## 2020-07-13 ENCOUNTER — Other Ambulatory Visit (INDEPENDENT_AMBULATORY_CARE_PROVIDER_SITE_OTHER): Payer: Medicare Other

## 2020-07-13 DIAGNOSIS — E871 Hypo-osmolality and hyponatremia: Secondary | ICD-10-CM

## 2020-07-13 DIAGNOSIS — M7061 Trochanteric bursitis, right hip: Secondary | ICD-10-CM | POA: Diagnosis not present

## 2020-07-13 NOTE — Progress Notes (Signed)
Order placed for f/u sodium lab.

## 2020-07-13 NOTE — Addendum Note (Signed)
Addended by: Leeanne Rio on: 07/13/2020 02:41 PM   Modules accepted: Orders

## 2020-07-14 LAB — SODIUM: Sodium: 130 mmol/L — ABNORMAL LOW (ref 135–146)

## 2020-07-15 NOTE — Addendum Note (Signed)
Addended by: Alisa Graff on: 07/15/2020 05:47 PM   Modules accepted: Orders

## 2020-07-16 ENCOUNTER — Other Ambulatory Visit (INDEPENDENT_AMBULATORY_CARE_PROVIDER_SITE_OTHER): Payer: Medicare Other

## 2020-07-16 ENCOUNTER — Telehealth: Payer: Self-pay

## 2020-07-16 DIAGNOSIS — R197 Diarrhea, unspecified: Secondary | ICD-10-CM

## 2020-07-16 DIAGNOSIS — R109 Unspecified abdominal pain: Secondary | ICD-10-CM

## 2020-07-16 DIAGNOSIS — R634 Abnormal weight loss: Secondary | ICD-10-CM

## 2020-07-16 LAB — FECAL OCCULT BLOOD, IMMUNOCHEMICAL: Fecal Occult Bld: POSITIVE — AB

## 2020-07-16 NOTE — Telephone Encounter (Signed)
LMTCB

## 2020-07-16 NOTE — Telephone Encounter (Signed)
Received a call from [facility] stating that Stacey Snyder has a positive IFOB.

## 2020-07-16 NOTE — Telephone Encounter (Signed)
Notify pt that her stool test was positive for blood.  I had talked with her over the weekend.  Have ordered CT scan.  Given heme positive, I would like for her to see GI.  Let me know if agreeable and will place order for referral.  Also, will need f/u non fasting lab - to be drawn in [place].  She wanted scheduled with CT scan.

## 2020-07-17 NOTE — Telephone Encounter (Signed)
Order placed for GI referral.   

## 2020-07-17 NOTE — Telephone Encounter (Signed)
Spoke with patient. She is agreeable to GI referral- [facility]. She is going to have her CT Scan done on 3/24. Will do labs then. She is feeling better day by day and has been able to eat a little bit better. She is going to start an iron supplement. Gave recommendations on iron supplement. She will get at the pharmacy

## 2020-07-18 DIAGNOSIS — M5441 Lumbago with sciatica, right side: Secondary | ICD-10-CM | POA: Diagnosis not present

## 2020-07-18 DIAGNOSIS — M7061 Trochanteric bursitis, right hip: Secondary | ICD-10-CM | POA: Diagnosis not present

## 2020-07-18 DIAGNOSIS — M069 Rheumatoid arthritis, unspecified: Secondary | ICD-10-CM | POA: Diagnosis not present

## 2020-07-18 DIAGNOSIS — I10 Essential (primary) hypertension: Secondary | ICD-10-CM | POA: Diagnosis not present

## 2020-07-18 DIAGNOSIS — E785 Hyperlipidemia, unspecified: Secondary | ICD-10-CM | POA: Diagnosis not present

## 2020-07-18 DIAGNOSIS — G8929 Other chronic pain: Secondary | ICD-10-CM | POA: Diagnosis not present

## 2020-07-20 DIAGNOSIS — M069 Rheumatoid arthritis, unspecified: Secondary | ICD-10-CM | POA: Diagnosis not present

## 2020-07-20 DIAGNOSIS — M5441 Lumbago with sciatica, right side: Secondary | ICD-10-CM | POA: Diagnosis not present

## 2020-07-20 DIAGNOSIS — E785 Hyperlipidemia, unspecified: Secondary | ICD-10-CM | POA: Diagnosis not present

## 2020-07-20 DIAGNOSIS — M7061 Trochanteric bursitis, right hip: Secondary | ICD-10-CM | POA: Diagnosis not present

## 2020-07-20 DIAGNOSIS — I10 Essential (primary) hypertension: Secondary | ICD-10-CM | POA: Diagnosis not present

## 2020-07-20 DIAGNOSIS — G8929 Other chronic pain: Secondary | ICD-10-CM | POA: Diagnosis not present

## 2020-07-23 DIAGNOSIS — M05712 Rheumatoid arthritis with rheumatoid factor of left shoulder without organ or systems involvement: Secondary | ICD-10-CM | POA: Diagnosis not present

## 2020-07-24 DIAGNOSIS — E785 Hyperlipidemia, unspecified: Secondary | ICD-10-CM | POA: Diagnosis not present

## 2020-07-24 DIAGNOSIS — I10 Essential (primary) hypertension: Secondary | ICD-10-CM | POA: Diagnosis not present

## 2020-07-24 DIAGNOSIS — G8929 Other chronic pain: Secondary | ICD-10-CM | POA: Diagnosis not present

## 2020-07-24 DIAGNOSIS — M7061 Trochanteric bursitis, right hip: Secondary | ICD-10-CM | POA: Diagnosis not present

## 2020-07-24 DIAGNOSIS — M069 Rheumatoid arthritis, unspecified: Secondary | ICD-10-CM | POA: Diagnosis not present

## 2020-07-24 DIAGNOSIS — M5441 Lumbago with sciatica, right side: Secondary | ICD-10-CM | POA: Diagnosis not present

## 2020-07-25 ENCOUNTER — Other Ambulatory Visit
Admission: RE | Admit: 2020-07-25 | Discharge: 2020-07-25 | Disposition: A | Discharge status: Home / Self Care | Payer: Medicare Other | Attending: Internal Medicine | Admitting: Internal Medicine

## 2020-07-25 ENCOUNTER — Other Ambulatory Visit: Payer: Self-pay | Admitting: Internal Medicine

## 2020-07-25 DIAGNOSIS — D649 Anemia, unspecified: Secondary | ICD-10-CM

## 2020-07-25 DIAGNOSIS — E871 Hypo-osmolality and hyponatremia: Secondary | ICD-10-CM

## 2020-07-25 LAB — CBC WITH DIFFERENTIAL/PLATELET
Abs Immature Granulocytes: 0.05 10*3/uL (ref 0.00–0.07)
Basophils Absolute: 0 10*3/uL (ref 0.0–0.1)
Basophils Relative: 1 %
Eosinophils Absolute: 0.1 10*3/uL (ref 0.0–0.5)
Eosinophils Relative: 3 %
HCT: 33.6 % — ABNORMAL LOW (ref 36.0–46.0)
Hemoglobin: 11 g/dL — ABNORMAL LOW (ref 12.0–15.0)
Immature Granulocytes: 1 %
Lymphocytes Relative: 45 %
Lymphs Abs: 1.7 10*3/uL (ref 0.7–4.0)
MCH: 30.7 pg (ref 26.0–34.0)
MCHC: 32.7 g/dL (ref 30.0–36.0)
MCV: 93.9 fL (ref 80.0–100.0)
Monocytes Absolute: 0.4 10*3/uL (ref 0.1–1.0)
Monocytes Relative: 10 %
Neutro Abs: 1.5 10*3/uL — ABNORMAL LOW (ref 1.7–7.7)
Neutrophils Relative %: 40 %
Platelets: 320 10*3/uL (ref 150–400)
RBC: 3.58 MIL/uL — ABNORMAL LOW (ref 3.87–5.11)
RDW: 14 % (ref 11.5–15.5)
WBC: 3.7 10*3/uL — ABNORMAL LOW (ref 4.0–10.5)
nRBC: 0 % (ref 0.0–0.2)

## 2020-07-25 LAB — BASIC METABOLIC PANEL
Anion gap: 7 (ref 5–15)
BUN: 24 mg/dL — ABNORMAL HIGH (ref 8–23)
CO2: 27 mmol/L (ref 22–32)
Calcium: 9 mg/dL (ref 8.9–10.3)
Chloride: 103 mmol/L (ref 98–111)
Creatinine, Ser: 1.01 mg/dL — ABNORMAL HIGH (ref 0.44–1.00)
GFR, Estimated: 55 mL/min — ABNORMAL LOW (ref >60)
Glucose, Bld: 109 mg/dL — ABNORMAL HIGH (ref 70–99)
Potassium: 3.6 mmol/L (ref 3.5–5.1)
Sodium: 137 mmol/L (ref 135–145)

## 2020-07-25 NOTE — Progress Notes (Signed)
Order placed for f/u labs.  

## 2020-07-26 ENCOUNTER — Ambulatory Visit
Admission: RE | Admit: 2020-07-26 | Discharge: 2020-07-26 | Disposition: A | Discharge status: Home / Self Care | Payer: Medicare Other | Source: Ambulatory Visit | Attending: Internal Medicine | Admitting: Internal Medicine

## 2020-07-26 ENCOUNTER — Other Ambulatory Visit: Payer: Self-pay

## 2020-07-26 DIAGNOSIS — R110 Nausea: Secondary | ICD-10-CM | POA: Diagnosis not present

## 2020-07-26 DIAGNOSIS — I700 Atherosclerosis of aorta: Secondary | ICD-10-CM | POA: Diagnosis not present

## 2020-07-26 DIAGNOSIS — R945 Abnormal results of liver function studies: Secondary | ICD-10-CM | POA: Diagnosis not present

## 2020-07-26 DIAGNOSIS — D649 Anemia, unspecified: Secondary | ICD-10-CM | POA: Insufficient documentation

## 2020-07-26 DIAGNOSIS — R634 Abnormal weight loss: Secondary | ICD-10-CM | POA: Insufficient documentation

## 2020-07-26 DIAGNOSIS — K862 Cyst of pancreas: Secondary | ICD-10-CM | POA: Diagnosis not present

## 2020-07-26 DIAGNOSIS — R7989 Other specified abnormal findings of blood chemistry: Secondary | ICD-10-CM

## 2020-07-26 DIAGNOSIS — N200 Calculus of kidney: Secondary | ICD-10-CM | POA: Diagnosis not present

## 2020-07-27 DIAGNOSIS — E785 Hyperlipidemia, unspecified: Secondary | ICD-10-CM | POA: Diagnosis not present

## 2020-07-27 DIAGNOSIS — G8929 Other chronic pain: Secondary | ICD-10-CM | POA: Diagnosis not present

## 2020-07-27 DIAGNOSIS — M7061 Trochanteric bursitis, right hip: Secondary | ICD-10-CM | POA: Diagnosis not present

## 2020-07-27 DIAGNOSIS — I10 Essential (primary) hypertension: Secondary | ICD-10-CM | POA: Diagnosis not present

## 2020-07-27 DIAGNOSIS — M069 Rheumatoid arthritis, unspecified: Secondary | ICD-10-CM | POA: Diagnosis not present

## 2020-07-27 DIAGNOSIS — M5441 Lumbago with sciatica, right side: Secondary | ICD-10-CM | POA: Diagnosis not present

## 2020-07-30 ENCOUNTER — Encounter: Payer: Self-pay | Admitting: Internal Medicine

## 2020-07-30 DIAGNOSIS — G8929 Other chronic pain: Secondary | ICD-10-CM | POA: Diagnosis not present

## 2020-07-30 DIAGNOSIS — I10 Essential (primary) hypertension: Secondary | ICD-10-CM | POA: Diagnosis not present

## 2020-07-30 DIAGNOSIS — M7061 Trochanteric bursitis, right hip: Secondary | ICD-10-CM | POA: Diagnosis not present

## 2020-07-30 DIAGNOSIS — M069 Rheumatoid arthritis, unspecified: Secondary | ICD-10-CM | POA: Diagnosis not present

## 2020-07-30 DIAGNOSIS — M5441 Lumbago with sciatica, right side: Secondary | ICD-10-CM | POA: Diagnosis not present

## 2020-07-30 DIAGNOSIS — I700 Atherosclerosis of aorta: Secondary | ICD-10-CM | POA: Insufficient documentation

## 2020-07-30 DIAGNOSIS — E785 Hyperlipidemia, unspecified: Secondary | ICD-10-CM | POA: Diagnosis not present

## 2020-08-02 DIAGNOSIS — G8929 Other chronic pain: Secondary | ICD-10-CM | POA: Diagnosis not present

## 2020-08-02 DIAGNOSIS — E785 Hyperlipidemia, unspecified: Secondary | ICD-10-CM | POA: Diagnosis not present

## 2020-08-02 DIAGNOSIS — M069 Rheumatoid arthritis, unspecified: Secondary | ICD-10-CM | POA: Diagnosis not present

## 2020-08-02 DIAGNOSIS — M5441 Lumbago with sciatica, right side: Secondary | ICD-10-CM | POA: Diagnosis not present

## 2020-08-02 DIAGNOSIS — I10 Essential (primary) hypertension: Secondary | ICD-10-CM | POA: Diagnosis not present

## 2020-08-02 DIAGNOSIS — M7061 Trochanteric bursitis, right hip: Secondary | ICD-10-CM | POA: Diagnosis not present

## 2020-08-07 DIAGNOSIS — G8929 Other chronic pain: Secondary | ICD-10-CM | POA: Diagnosis not present

## 2020-08-07 DIAGNOSIS — M7061 Trochanteric bursitis, right hip: Secondary | ICD-10-CM | POA: Diagnosis not present

## 2020-08-07 DIAGNOSIS — E785 Hyperlipidemia, unspecified: Secondary | ICD-10-CM | POA: Diagnosis not present

## 2020-08-07 DIAGNOSIS — M069 Rheumatoid arthritis, unspecified: Secondary | ICD-10-CM | POA: Diagnosis not present

## 2020-08-07 DIAGNOSIS — M5441 Lumbago with sciatica, right side: Secondary | ICD-10-CM | POA: Diagnosis not present

## 2020-08-07 DIAGNOSIS — I10 Essential (primary) hypertension: Secondary | ICD-10-CM | POA: Diagnosis not present

## 2020-08-11 DIAGNOSIS — K589 Irritable bowel syndrome without diarrhea: Secondary | ICD-10-CM | POA: Diagnosis not present

## 2020-08-11 DIAGNOSIS — G8929 Other chronic pain: Secondary | ICD-10-CM | POA: Diagnosis not present

## 2020-08-11 DIAGNOSIS — M461 Sacroiliitis, not elsewhere classified: Secondary | ICD-10-CM | POA: Diagnosis not present

## 2020-08-11 DIAGNOSIS — N6019 Diffuse cystic mastopathy of unspecified breast: Secondary | ICD-10-CM | POA: Diagnosis not present

## 2020-08-11 DIAGNOSIS — I10 Essential (primary) hypertension: Secondary | ICD-10-CM | POA: Diagnosis not present

## 2020-08-11 DIAGNOSIS — Z7951 Long term (current) use of inhaled steroids: Secondary | ICD-10-CM | POA: Diagnosis not present

## 2020-08-11 DIAGNOSIS — M8580 Other specified disorders of bone density and structure, unspecified site: Secondary | ICD-10-CM | POA: Diagnosis not present

## 2020-08-11 DIAGNOSIS — M069 Rheumatoid arthritis, unspecified: Secondary | ICD-10-CM | POA: Diagnosis not present

## 2020-08-11 DIAGNOSIS — K5790 Diverticulosis of intestine, part unspecified, without perforation or abscess without bleeding: Secondary | ICD-10-CM | POA: Diagnosis not present

## 2020-08-11 DIAGNOSIS — M5441 Lumbago with sciatica, right side: Secondary | ICD-10-CM | POA: Diagnosis not present

## 2020-08-11 DIAGNOSIS — M7061 Trochanteric bursitis, right hip: Secondary | ICD-10-CM | POA: Diagnosis not present

## 2020-08-11 DIAGNOSIS — E785 Hyperlipidemia, unspecified: Secondary | ICD-10-CM | POA: Diagnosis not present

## 2020-08-15 DIAGNOSIS — M069 Rheumatoid arthritis, unspecified: Secondary | ICD-10-CM | POA: Diagnosis not present

## 2020-08-15 DIAGNOSIS — G8929 Other chronic pain: Secondary | ICD-10-CM | POA: Diagnosis not present

## 2020-08-15 DIAGNOSIS — E785 Hyperlipidemia, unspecified: Secondary | ICD-10-CM | POA: Diagnosis not present

## 2020-08-15 DIAGNOSIS — M5441 Lumbago with sciatica, right side: Secondary | ICD-10-CM | POA: Diagnosis not present

## 2020-08-15 DIAGNOSIS — I10 Essential (primary) hypertension: Secondary | ICD-10-CM | POA: Diagnosis not present

## 2020-08-15 DIAGNOSIS — M7061 Trochanteric bursitis, right hip: Secondary | ICD-10-CM | POA: Diagnosis not present

## 2020-08-20 DIAGNOSIS — M533 Sacrococcygeal disorders, not elsewhere classified: Secondary | ICD-10-CM | POA: Diagnosis not present

## 2020-08-20 DIAGNOSIS — Z9889 Other specified postprocedural states: Secondary | ICD-10-CM | POA: Diagnosis not present

## 2020-08-20 DIAGNOSIS — G8929 Other chronic pain: Secondary | ICD-10-CM | POA: Diagnosis not present

## 2020-08-20 DIAGNOSIS — M5416 Radiculopathy, lumbar region: Secondary | ICD-10-CM | POA: Diagnosis not present

## 2020-08-20 DIAGNOSIS — M7061 Trochanteric bursitis, right hip: Secondary | ICD-10-CM | POA: Diagnosis not present

## 2020-08-20 DIAGNOSIS — M4807 Spinal stenosis, lumbosacral region: Secondary | ICD-10-CM | POA: Diagnosis not present

## 2020-08-20 DIAGNOSIS — M5441 Lumbago with sciatica, right side: Secondary | ICD-10-CM | POA: Diagnosis not present

## 2020-08-21 ENCOUNTER — Ambulatory Visit: Payer: Medicare Other | Admitting: Internal Medicine

## 2020-08-23 ENCOUNTER — Other Ambulatory Visit: Payer: Self-pay | Admitting: Student

## 2020-08-23 DIAGNOSIS — M5416 Radiculopathy, lumbar region: Secondary | ICD-10-CM

## 2020-08-23 DIAGNOSIS — M4807 Spinal stenosis, lumbosacral region: Secondary | ICD-10-CM

## 2020-08-23 DIAGNOSIS — Z9889 Other specified postprocedural states: Secondary | ICD-10-CM

## 2020-08-23 DIAGNOSIS — E785 Hyperlipidemia, unspecified: Secondary | ICD-10-CM | POA: Diagnosis not present

## 2020-08-23 DIAGNOSIS — M5441 Lumbago with sciatica, right side: Secondary | ICD-10-CM | POA: Diagnosis not present

## 2020-08-23 DIAGNOSIS — M069 Rheumatoid arthritis, unspecified: Secondary | ICD-10-CM | POA: Diagnosis not present

## 2020-08-23 DIAGNOSIS — G8929 Other chronic pain: Secondary | ICD-10-CM

## 2020-08-23 DIAGNOSIS — I10 Essential (primary) hypertension: Secondary | ICD-10-CM | POA: Diagnosis not present

## 2020-08-23 DIAGNOSIS — M7061 Trochanteric bursitis, right hip: Secondary | ICD-10-CM | POA: Diagnosis not present

## 2020-09-01 ENCOUNTER — Other Ambulatory Visit: Payer: Self-pay

## 2020-09-01 ENCOUNTER — Ambulatory Visit
Admission: RE | Admit: 2020-09-01 | Discharge: 2020-09-01 | Disposition: A | Discharge status: Home / Self Care | Payer: Medicare Other | Source: Ambulatory Visit | Attending: Student | Admitting: Student

## 2020-09-01 DIAGNOSIS — M4807 Spinal stenosis, lumbosacral region: Secondary | ICD-10-CM | POA: Diagnosis not present

## 2020-09-01 DIAGNOSIS — M5416 Radiculopathy, lumbar region: Secondary | ICD-10-CM | POA: Diagnosis not present

## 2020-09-01 DIAGNOSIS — G8929 Other chronic pain: Secondary | ICD-10-CM | POA: Diagnosis not present

## 2020-09-01 DIAGNOSIS — Z9889 Other specified postprocedural states: Secondary | ICD-10-CM | POA: Diagnosis not present

## 2020-09-01 DIAGNOSIS — M545 Low back pain, unspecified: Secondary | ICD-10-CM | POA: Diagnosis not present

## 2020-09-01 DIAGNOSIS — M5441 Lumbago with sciatica, right side: Secondary | ICD-10-CM | POA: Diagnosis not present

## 2020-09-03 ENCOUNTER — Ambulatory Visit (INDEPENDENT_AMBULATORY_CARE_PROVIDER_SITE_OTHER): Payer: Medicare Other | Admitting: Internal Medicine

## 2020-09-03 ENCOUNTER — Other Ambulatory Visit: Payer: Self-pay

## 2020-09-03 VITALS — BP 106/68 | HR 79 | Temp 97.0°F | Resp 16 | Ht 61.0 in | Wt 105.0 lb

## 2020-09-03 DIAGNOSIS — D649 Anemia, unspecified: Secondary | ICD-10-CM

## 2020-09-03 DIAGNOSIS — D472 Monoclonal gammopathy: Secondary | ICD-10-CM | POA: Diagnosis not present

## 2020-09-03 DIAGNOSIS — I700 Atherosclerosis of aorta: Secondary | ICD-10-CM | POA: Diagnosis not present

## 2020-09-03 DIAGNOSIS — K219 Gastro-esophageal reflux disease without esophagitis: Secondary | ICD-10-CM

## 2020-09-03 DIAGNOSIS — E7800 Pure hypercholesterolemia, unspecified: Secondary | ICD-10-CM

## 2020-09-03 DIAGNOSIS — F439 Reaction to severe stress, unspecified: Secondary | ICD-10-CM

## 2020-09-03 DIAGNOSIS — M069 Rheumatoid arthritis, unspecified: Secondary | ICD-10-CM

## 2020-09-03 DIAGNOSIS — E871 Hypo-osmolality and hyponatremia: Secondary | ICD-10-CM | POA: Diagnosis not present

## 2020-09-03 DIAGNOSIS — R634 Abnormal weight loss: Secondary | ICD-10-CM

## 2020-09-03 DIAGNOSIS — M48061 Spinal stenosis, lumbar region without neurogenic claudication: Secondary | ICD-10-CM

## 2020-09-03 DIAGNOSIS — R7989 Other specified abnormal findings of blood chemistry: Secondary | ICD-10-CM

## 2020-09-03 DIAGNOSIS — I10 Essential (primary) hypertension: Secondary | ICD-10-CM | POA: Diagnosis not present

## 2020-09-03 DIAGNOSIS — R945 Abnormal results of liver function studies: Secondary | ICD-10-CM

## 2020-09-03 LAB — BASIC METABOLIC PANEL
BUN: 19 mg/dL (ref 6–23)
CO2: 32 mEq/L (ref 19–32)
Calcium: 9.6 mg/dL (ref 8.4–10.5)
Chloride: 94 mEq/L — ABNORMAL LOW (ref 96–112)
Creatinine, Ser: 0.96 mg/dL (ref 0.40–1.20)
GFR: 53.99 mL/min — ABNORMAL LOW (ref >60.00)
Glucose, Bld: 106 mg/dL — ABNORMAL HIGH (ref 70–99)
Potassium: 3.6 mEq/L (ref 3.5–5.1)
Sodium: 133 mEq/L — ABNORMAL LOW (ref 135–145)

## 2020-09-03 LAB — CBC WITH DIFFERENTIAL/PLATELET
Basophils Absolute: 0 10*3/uL (ref 0.0–0.1)
Basophils Relative: 0.7 % (ref 0.0–3.0)
Eosinophils Absolute: 0.2 10*3/uL (ref 0.0–0.7)
Eosinophils Relative: 2.4 % (ref 0.0–5.0)
HCT: 35.2 % — ABNORMAL LOW (ref 36.0–46.0)
Hemoglobin: 11.6 g/dL — ABNORMAL LOW (ref 12.0–15.0)
Lymphocytes Relative: 30.9 % (ref 12.0–46.0)
Lymphs Abs: 2 10*3/uL (ref 0.7–4.0)
MCHC: 33 g/dL (ref 30.0–36.0)
MCV: 94.6 fl (ref 78.0–100.0)
Monocytes Absolute: 0.6 10*3/uL (ref 0.1–1.0)
Monocytes Relative: 9.4 % (ref 3.0–12.0)
Neutro Abs: 3.7 10*3/uL (ref 1.4–7.7)
Neutrophils Relative %: 56.6 % (ref 43.0–77.0)
Platelets: 263 10*3/uL (ref 150.0–400.0)
RBC: 3.72 Mil/uL — ABNORMAL LOW (ref 3.87–5.11)
RDW: 14.4 % (ref 11.5–15.5)
WBC: 6.6 10*3/uL (ref 4.0–10.5)

## 2020-09-03 LAB — HM DIABETES FOOT EXAM

## 2020-09-03 NOTE — Progress Notes (Signed)
Patient ID: Stacey Snyder, female   DOB: Oct 02, 1932, 87 y.o.   MRN: [id number]   Subjective:    Patient ID: Stacey Snyder, female    DOB: October 26, 1932, 87 y.o.   MRN: [id number]  HPI This visit occurred during the SARS-CoV-2 public health emergency.  Safety protocols were in place, including screening questions prior to the visit, additional usage of staff PPE, and extensive cleaning of exam room while observing appropriate contact time as indicated for disinfecting solutions.  Patient here for a scheduled follow up.  Still with increased stress.  Husband passed 06/23/20.  Discussed.  She feels she is handling things relatively well.  Last visit, was having persistent diarrhea.  Reports feels better now.  No nausea, vomiting or diarrhea currently.  Occasional constipation. Takes miralax.  She is eating - 2 meals per day. Drinking boost.  Weight is stable from last check.  Still down from previous checks.  Has appt with GI next week.  No chest pain.  Breathing stable.  She did start back on triam/hctz - taking 1/2 tablet per day.  Had previously stopped secondary to decreased GFR and hyponatremia.  Back on 1/2 tablet now for two weeks. Denies any abdominal pain.  Does report low back pain - down to knee - numb down leg.  Just had MRI and plans to f/u with ortho to discuss results and further treatment.  Just had CT scan abdomen - 2.7 cm cystic lesion arising from pancreatic head.  Recommended f/u MRI in one year.  Overall feels better.   Past Medical History:  Diagnosis Date  . Diverticulosis   . Fibrocystic breast disease   . GERD (gastroesophageal reflux disease)   . History of kidney stones   . Hypercholesterolemia   . Hypertension   . Hypertension   . IBS (irritable bowel syndrome)   . Inflammatory arthritis    positive anti-CCP abs, s/p prednisone, MTX, Remicade  . Nephrolithiasis   . Osteopenia    GI upset with Fosamax  . Pancreatitis 2007   s/p ERCP  . Pancreatitis    . PONV (postoperative nausea and vomiting)   . Renal insufficiency   . Spinal stenosis of lumbosacral region    Past Surgical History:  Procedure Laterality Date  . ABDOMINAL HYSTERECTOMY  1993   ovaries not removed  . BREAST BIOPSY Left 1984   negative  . BREAST CYST EXCISION Left 2010   negative  . LITHOTRIPSY    . LUMBAR LAMINECTOMY  1983  . LUMBAR LAMINECTOMY/DECOMPRESSION MICRODISCECTOMY N/A 08/18/2016   Procedure: LUMBAR LAMINECTOMY/DECOMPRESSION MICRODISCECTOMY 1 LEVEL;  Surgeon: Bayard Hugger, MD;  Location: [facility] ORS;  Service: Neurosurgery;  Laterality: N/A;  L4-5 Laminectomy, MIS, L5 foraminotomy  . TRIGGER FINGER RELEASE     right ring finger   Family History  Problem Relation Age of Onset  . Ovarian cancer Mother   . Renal Disease Mother   . Heart disease Mother   . Diabetes Mother   . Hypertension Mother   . Heart disease Father   . Breast cancer Neg Hx    Social History   Socioeconomic History  . Marital status: Married    Spouse name: Not on file  . Number of children: 4  . Years of education: Not on file  . Highest education level: Not on file  Occupational History  . Not on file  Tobacco Use  . Smoking status: Never Smoker  . Smokeless tobacco: Never Used  Vaping Use  .  Vaping Use: Never used  Substance and Sexual Activity  . Alcohol use: No    Alcohol/week: 0.0 standard drinks  . Drug use: No  . Sexual activity: Not on file  Other Topics Concern  . Not on file  Social History Narrative  . Not on file   Social Determinants of Health   Financial Resource Strain: Not on file  Food Insecurity: Not on file  Transportation Needs: Not on file  Physical Activity: Not on file  Stress: Not on file  Social Connections: Not on file    . Outpatient Encounter Medications as of 09/03/2020  Medication Sig  . cyanocobalamin 1000 MCG tablet Take 1,000 mcg by mouth daily.  . ferrous sulfate 325 (65 FE) MG EC tablet Take 325 mg by mouth 3 (three) times  daily with meals.  . celecoxib (CELEBREX) 100 MG capsule Take 100 mg by mouth 2 (two) times daily.  . Cholecalciferol (VITAMIN D) 2000 UNITS tablet Take 2,000 Units by mouth daily.  . clotrimazole-betamethasone (LOTRISONE) cream Apply 1 application topically 2 (two) times daily.  Marland Kitchen DILT-XR 240 MG 24 hr capsule TAKE 1 CAPSULE DAILY  . dorzolamide-timolol (COSOPT) 22.3-6.8 MG/ML ophthalmic solution INSTILL 1 DROP INTO EACH EYE TWICE DAILY  . fluticasone (FLONASE) 50 MCG/ACT nasal spray USE TWO SPRAY(S) IN EACH NOSTRIL ONCE DAILY  . folic acid (FOLVITE) 1 MG tablet Take 1 mg by mouth 2 (two) times daily.  Marland Kitchen gabapentin (NEURONTIN) 300 MG capsule TAKE 1 CAPSULE AT BEDTIME  . InFLIXimab (REMICADE IV) Inject into the vein every 6 (six) weeks. Per Dr Jefm Bryant  . latanoprost (XALATAN) 0.005 % ophthalmic solution   . losartan (COZAAR) 100 MG tablet TAKE 1 TABLET DAILY  . lovastatin (MEVACOR) 40 MG tablet Take 1 tablet (40 mg total) by mouth daily.  . methotrexate 25 MG/ML SOLN Inject 17.5 mg into the skin once a week. Patient takes 17.5 mg (0.7 ml) on Monday or Tuesday of each week.  Marland Kitchen omeprazole (PRILOSEC) 20 MG capsule TAKE 1 CAPSULE DAILY  . ondansetron (ZOFRAN) 4 MG tablet Take 1 tablet (4 mg total) by mouth every 6 (six) hours as needed for nausea or vomiting.  . phenazopyridine (PYRIDIUM) 100 MG tablet Take 1 tablet (100 mg total) by mouth 3 (three) times daily as needed for pain.  . polyethylene glycol (MIRALAX / GLYCOLAX) packet Take 17 g by mouth daily.  . Probiotic Product (ALIGN) 4 MG CAPS Take one capsule daily  . psyllium (METAMUCIL) 58.6 % packet Take 1 packet by mouth daily.  Marland Kitchen triamcinolone cream (KENALOG) 0.1 % Apply 1 application topically 2 (two) times daily.  Marland Kitchen triamterene-hydrochlorothiazide (MAXZIDE-25) 37.5-25 MG tablet TAKE 1 TABLET DAILY  . zoledronic acid (RECLAST) 5 MG/100ML SOLN injection Inject 5 mg into the vein once.   No facility-administered encounter medications on  file as of 09/03/2020.    Review of Systems  Constitutional: Negative for appetite change and unexpected weight change.  HENT: Negative for congestion and sinus pressure.   Respiratory: Negative for cough, chest tightness and shortness of breath.   Cardiovascular: Negative for chest pain, palpitations and leg swelling.  Gastrointestinal: Negative for abdominal pain, diarrhea, nausea and vomiting.  Genitourinary: Negative for difficulty urinating and dysuria.  Musculoskeletal: Negative for joint swelling and myalgias.  Skin: Negative for color change and rash.  Neurological: Negative for dizziness, light-headedness and headaches.  Psychiatric/Behavioral: Negative for agitation and dysphoric mood.       Objective:    Physical Exam Vitals reviewed.  Constitutional:      General: She is not in acute distress.    Appearance: Normal appearance.  HENT:     Head: Normocephalic and atraumatic.     Right Ear: External ear normal.     Left Ear: External ear normal.  Eyes:     General: No scleral icterus.       Right eye: No discharge.        Left eye: No discharge.     Conjunctiva/sclera: Conjunctivae normal.  Neck:     Thyroid: No thyromegaly.  Cardiovascular:     Rate and Rhythm: Normal rate and regular rhythm.  Pulmonary:     Effort: No respiratory distress.     Breath sounds: Normal breath sounds. No wheezing.  Abdominal:     General: Bowel sounds are normal.     Palpations: Abdomen is soft.     Tenderness: There is no abdominal tenderness.  Musculoskeletal:        General: No swelling or tenderness.     Cervical back: Neck supple. No tenderness.  Lymphadenopathy:     Cervical: No cervical adenopathy.  Skin:    Findings: No erythema or rash.  Neurological:     Mental Status: She is alert.  Psychiatric:        Mood and Affect: Mood normal.        Behavior: Behavior normal.     BP 106/68   Pulse 79   Temp (!) 97 F (36.1 C) (Temporal)   Resp 16   Ht 5\' 1"  (1.549  m)   Wt 105 lb (47.6 kg)   SpO2 99%   BMI 19.84 kg/m  Wt Readings from Last 3 Encounters:  09/03/20 105 lb (47.6 kg)  07/10/20 104 lb (47.2 kg)  06/15/20 111 lb (50.3 kg)     Lab Results  Component Value Date   WBC 6.6 09/03/2020   HGB 11.6 (L) 09/03/2020   HCT 35.2 (L) 09/03/2020   PLT 263.0 09/03/2020   GLUCOSE 106 (H) 09/03/2020   CHOL 195 04/17/2020   TRIG 78.0 04/17/2020   HDL 96.30 04/17/2020   LDLCALC 83 04/17/2020   ALT 32 07/12/2020   AST 20 07/12/2020   NA 133 (L) 09/03/2020   K 3.6 09/03/2020   CL 94 (L) 09/03/2020   CREATININE 0.96 09/03/2020   BUN 19 09/03/2020   CO2 32 09/03/2020   TSH 1.74 04/17/2020   INR 0.92 08/07/2016    MR LUMBAR SPINE WO CONTRAST  Result Date: 09/02/2020 CLINICAL DATA:  Low back pain worsening recently. Pain radiates to the right leg and foot. EXAM: MRI LUMBAR SPINE WITHOUT CONTRAST TECHNIQUE: Multiplanar, multisequence MR imaging of the lumbar spine was performed. No intravenous contrast was administered. COMPARISON:  07/22/2016 FINDINGS: Segmentation:  5 lumbar type vertebral bodies. Alignment: 2 mm retrolisthesis L1-2. 2 mm anterolisthesis L2-3. 4 mm anterolisthesis L3-4. Vertebrae:  No fracture or primary bone lesion. Conus medullaris and cauda equina: Conus extends to the T12-L1 level. Conus and cauda equina appear normal. Paraspinal and other soft tissues: Negative Disc levels: T12-L1: Normal L1-2: 2 mm retrolisthesis. Shallow disc herniation with upward migration behind the inferior aspect of L1. This is new since 2018. This does not appear to cause distinct neural compression. L2-3: 2 mm anterolisthesis. Moderate disc bulge. Facet and ligamentous hypertrophy. Multifactorial spinal stenosis at this level that could cause neural compression on either or both sides. Stenosis has worsened since 2018. L3-4: Right hemilaminectomy and facetectomy. 4 mm degenerative anterolisthesis. Shallow  disc protrusion with biforaminal extension right more  than left. Facet degeneration and hypertrophy on the left. Stenosis of the subarticular lateral recess on the left that could cause neural compression. Bilateral foraminal stenosis that could cause neural compression. All these findings are worsened since 2018. L4-5: Previous hemilaminectomy on the right. Disc degeneration with shallow protrusion. Slight caudal down turning. Mild stenosis of the lateral recesses and foramina but without distinct neural compression. Laminectomy is newly performed since the previous exam. L5-S1: Normal appearance of the disc. Mild facet osteoarthritis. No stenosis. IMPRESSION: L1-2: New shallow central disc herniation with upward migration. This indents the thecal sac slightly but does not appear to cause neural compression. L2-3: Multifactorial spinal stenosis, worsened since 2018, due to bulging of the disc in combination with facet ligamentous hypertrophy. This could be symptomatic. L3-4: Previous right hemilaminectomy and facetectomy. 4 mm degenerative anterolisthesis. Protrusion of the disc with biforaminal extension right more than left. Pronounced facet hypertrophy on the left. Stenosis could occur in the left lateral recess and in both neural foramina. Findings are worsened since 2018. L4-5: Interval right hemilaminectomy. Shallow protrusion of the disc with slight caudal down turning. Mild narrowing of the lateral recesses and foramina but without likely neural compression. Electronically Signed   By: Nelson Chimes M.D.   On: 09/02/2020 19:53       Assessment & Plan:   Problem List Items Addressed This Visit    Abnormal liver function tests    Recent repeat liver panel wnl.       Anemia - Primary    Recheck cbc.        Relevant Medications   cyanocobalamin 1000 MCG tablet   ferrous sulfate 325 (65 FE) MG EC tablet   Other Relevant Orders   CBC with Differential/Platelet (Completed)   Aortic atherosclerosis (HCC)    Continue lovastatin.       GERD  (gastroesophageal reflux disease)    No acid reflux, nausea or vomiting reported.  Continues on prilosec.        Hypercholesterolemia    Continue lovastatin.  Follow lipid panel and liver function tests.       Hypertension    Continue losartan. She has recently started back on 1/2 triam/hctz.  Check metabolic panel - to confirm renal function and sodium wnl.  Follow pressures.  Follow metabolic panel.       Hyponatremia    Eating better.  She did restart triam/hctz 1/2 tablet per day.  Recheck sodium today.       Relevant Orders   Basic metabolic panel (Completed)   Loss of weight    Weight stable from last check, decreased from previous checks.  CT as outlined.  Is eating better now.  Diarrhea better.  Has f/u planned with GI.  Discuss above and discuss further w/up regarding pancreatic lesion. Continue increased po intake and nutritional supplements.       Lumbar stenosis    Is s/p lumbar laminectomy.  Increased pain recently.  Saw ortho.  MRI as outlined.  Has f/up planned to discuss further w/up, evaluation and treatment.        MGUS (monoclonal gammopathy of unknown significance)    Reviewed. Overdue f/u.  D/w her regarding f/u.       Rheumatoid arthritis ([place])    Receiving remicade injections.  Followed by Dr Jefm Bryant.        Stress    Discussed increased stress.  She has good support.  Does not feel needs any  further intervention.  Follow.           Einar Pheasant, MD

## 2020-09-04 ENCOUNTER — Other Ambulatory Visit: Payer: Self-pay | Admitting: Internal Medicine

## 2020-09-04 ENCOUNTER — Telehealth: Payer: Self-pay | Admitting: Internal Medicine

## 2020-09-04 DIAGNOSIS — E871 Hypo-osmolality and hyponatremia: Secondary | ICD-10-CM

## 2020-09-04 NOTE — Telephone Encounter (Signed)
Patient returned Trisha's phone call. 

## 2020-09-04 NOTE — Progress Notes (Signed)
Order placed for f/u sodium.  ?

## 2020-09-04 NOTE — Telephone Encounter (Signed)
See result note.  

## 2020-09-05 DIAGNOSIS — M069 Rheumatoid arthritis, unspecified: Secondary | ICD-10-CM | POA: Diagnosis not present

## 2020-09-05 DIAGNOSIS — M5441 Lumbago with sciatica, right side: Secondary | ICD-10-CM | POA: Diagnosis not present

## 2020-09-05 DIAGNOSIS — E785 Hyperlipidemia, unspecified: Secondary | ICD-10-CM | POA: Diagnosis not present

## 2020-09-05 DIAGNOSIS — G8929 Other chronic pain: Secondary | ICD-10-CM | POA: Diagnosis not present

## 2020-09-05 DIAGNOSIS — I10 Essential (primary) hypertension: Secondary | ICD-10-CM | POA: Diagnosis not present

## 2020-09-05 DIAGNOSIS — M7061 Trochanteric bursitis, right hip: Secondary | ICD-10-CM | POA: Diagnosis not present

## 2020-09-09 ENCOUNTER — Encounter: Payer: Self-pay | Admitting: Internal Medicine

## 2020-09-09 NOTE — Assessment & Plan Note (Signed)
Receiving remicade injections.  Followed by Dr Jefm Bryant.

## 2020-09-09 NOTE — Assessment & Plan Note (Signed)
Eating better.  She did restart triam/hctz 1/2 tablet per day.  Recheck sodium today.

## 2020-09-09 NOTE — Assessment & Plan Note (Signed)
Continue lovastatin 

## 2020-09-09 NOTE — Assessment & Plan Note (Signed)
Continue lovastatin.  Follow lipid panel and liver function tests.   

## 2020-09-09 NOTE — Assessment & Plan Note (Addendum)
Weight stable from last check, decreased from previous checks.  CT as outlined.  Is eating better now.  Diarrhea better.  Has f/u planned with GI.  Discuss above and discuss further w/up regarding pancreatic lesion. Continue increased po intake and nutritional supplements.

## 2020-09-09 NOTE — Assessment & Plan Note (Signed)
Discussed increased stress.  She has good support.  Does not feel needs any further intervention.  Follow.

## 2020-09-09 NOTE — Assessment & Plan Note (Signed)
Is s/p lumbar laminectomy.  Increased pain recently.  Saw ortho.  MRI as outlined.  Has f/up planned to discuss further w/up, evaluation and treatment.

## 2020-09-09 NOTE — Assessment & Plan Note (Signed)
Recent repeat liver panel wnl.

## 2020-09-09 NOTE — Assessment & Plan Note (Signed)
Recheck cbc.  

## 2020-09-09 NOTE — Assessment & Plan Note (Signed)
Continue losartan. She has recently started back on 1/2 triam/hctz.  Check metabolic panel - to confirm renal function and sodium wnl.  Follow pressures.  Follow metabolic panel.

## 2020-09-09 NOTE — Assessment & Plan Note (Signed)
No acid reflux, nausea or vomiting reported.  Continues on prilosec.

## 2020-09-09 NOTE — Assessment & Plan Note (Signed)
Reviewed. Overdue f/u.  D/w her regarding f/u.

## 2020-09-10 DIAGNOSIS — R195 Other fecal abnormalities: Secondary | ICD-10-CM | POA: Diagnosis not present

## 2020-09-10 DIAGNOSIS — R197 Diarrhea, unspecified: Secondary | ICD-10-CM | POA: Diagnosis not present

## 2020-09-10 DIAGNOSIS — R634 Abnormal weight loss: Secondary | ICD-10-CM | POA: Diagnosis not present

## 2020-09-10 DIAGNOSIS — K862 Cyst of pancreas: Secondary | ICD-10-CM | POA: Diagnosis not present

## 2020-09-12 ENCOUNTER — Encounter: Payer: Self-pay | Admitting: Internal Medicine

## 2020-09-12 ENCOUNTER — Telehealth: Payer: Self-pay | Admitting: Internal Medicine

## 2020-09-12 DIAGNOSIS — K862 Cyst of pancreas: Secondary | ICD-10-CM | POA: Insufficient documentation

## 2020-09-12 NOTE — Telephone Encounter (Signed)
See me before calling her.  Please call Stacey Snyder and let her know that she is overdue a f/u with hematology.  It has been a while that she has been seen.  They were following her blood (protein in her blood).  Let me know if agreeable for f/u and will place a new referral.

## 2020-09-13 ENCOUNTER — Other Ambulatory Visit: Payer: Self-pay | Admitting: Internal Medicine

## 2020-09-13 DIAGNOSIS — E785 Hyperlipidemia, unspecified: Secondary | ICD-10-CM

## 2020-09-14 NOTE — Telephone Encounter (Signed)
LMTCB

## 2020-09-17 DIAGNOSIS — M05712 Rheumatoid arthritis with rheumatoid factor of left shoulder without organ or systems involvement: Secondary | ICD-10-CM | POA: Diagnosis not present

## 2020-09-18 ENCOUNTER — Other Ambulatory Visit (INDEPENDENT_AMBULATORY_CARE_PROVIDER_SITE_OTHER): Payer: Medicare Other

## 2020-09-18 ENCOUNTER — Other Ambulatory Visit: Payer: Self-pay

## 2020-09-18 DIAGNOSIS — E871 Hypo-osmolality and hyponatremia: Secondary | ICD-10-CM

## 2020-09-18 LAB — SODIUM: Sodium: 135 mEq/L (ref 135–145)

## 2020-09-19 NOTE — Telephone Encounter (Signed)
Patient aware and does not want to see them right now. She is getting ready to see neurology about her back and wants to take care of this first.

## 2020-09-19 NOTE — Telephone Encounter (Signed)
LMTCB

## 2020-09-21 ENCOUNTER — Telehealth: Payer: Self-pay | Admitting: Internal Medicine

## 2020-09-21 NOTE — Telephone Encounter (Signed)
Left message for patient to call back and schedule Medicare Annual Wellness Visit (AWV) in office.   If not able to come in office, please offer to do virtually or by telephone.   Due for AWVI  Please schedule at anytime with Nurse Health Advisor.   

## 2020-09-27 DIAGNOSIS — M5416 Radiculopathy, lumbar region: Secondary | ICD-10-CM | POA: Diagnosis not present

## 2020-10-09 DIAGNOSIS — M48062 Spinal stenosis, lumbar region with neurogenic claudication: Secondary | ICD-10-CM | POA: Diagnosis not present

## 2020-10-09 DIAGNOSIS — M5126 Other intervertebral disc displacement, lumbar region: Secondary | ICD-10-CM | POA: Diagnosis not present

## 2020-10-09 DIAGNOSIS — M5416 Radiculopathy, lumbar region: Secondary | ICD-10-CM | POA: Diagnosis not present

## 2020-10-14 ENCOUNTER — Other Ambulatory Visit: Payer: Self-pay | Admitting: Internal Medicine

## 2020-10-24 ENCOUNTER — Telehealth: Payer: Self-pay | Admitting: Internal Medicine

## 2020-10-24 NOTE — Telephone Encounter (Signed)
Left message for patient to call back and schedule Medicare Annual Wellness Visit (AWV) in office.   If not able to come in office, please offer to do virtually or by telephone.   Due for AWVI  Please schedule at anytime with Nurse Health Advisor.   

## 2020-10-29 DIAGNOSIS — M05712 Rheumatoid arthritis with rheumatoid factor of left shoulder without organ or systems involvement: Secondary | ICD-10-CM | POA: Diagnosis not present

## 2020-11-04 ENCOUNTER — Ambulatory Visit
Admission: EM | Admit: 2020-11-04 | Discharge: 2020-11-04 | Disposition: A | Discharge status: Home / Self Care | Payer: Medicare Other | Attending: Emergency Medicine | Admitting: Emergency Medicine

## 2020-11-04 ENCOUNTER — Other Ambulatory Visit: Payer: Self-pay

## 2020-11-04 ENCOUNTER — Encounter: Payer: Self-pay | Admitting: Gynecology

## 2020-11-04 DIAGNOSIS — N3000 Acute cystitis without hematuria: Secondary | ICD-10-CM

## 2020-11-04 LAB — POCT URINALYSIS DIP (DEVICE)
Bilirubin Urine: NEGATIVE
Glucose, UA: NEGATIVE mg/dL
Ketones, ur: NEGATIVE mg/dL
Nitrite: NEGATIVE
Protein, ur: NEGATIVE mg/dL
Specific Gravity, Urine: 1.02 (ref 1.005–1.030)
Urobilinogen, UA: 0.2 mg/dL (ref 0.0–1.0)
pH: 7.5 (ref 5.0–8.0)

## 2020-11-04 MED ORDER — NITROFURANTOIN MONOHYD MACRO 100 MG PO CAPS: 100.0000 mg | ORAL_CAPSULE | Freq: Two times a day (BID) | ORAL | 0 refills | Status: DC

## 2020-11-04 MED ORDER — PHENAZOPYRIDINE HCL 200 MG PO TABS: 200.0000 mg | ORAL_TABLET | Freq: Three times a day (TID) | ORAL | 0 refills | Status: DC

## 2020-11-04 NOTE — ED Triage Notes (Signed)
Patient c/o recurrent urinary tract infection x last night.

## 2020-11-04 NOTE — ED Provider Notes (Signed)
[facility]    CSN: [id number] Arrival date & time: 11/04/20  0813      History   Chief Complaint No chief complaint on file.   HPI Stacey Snyder is a 87 y.o. female.   HPI  40-year-old female here for evaluation of UTI symptoms.  Patient reports that she developed urinary urgency and frequency last night that was also associated with painful urination and low back pain.  She states that she also has some suprapubic pain but that has resolved.  She took OTC cranberry tablets without much relief.  She denies fever or blood in her urine.  She has frequent UTIs with her most recent one being in February of this year.  She was treated with cefdinir at that time and her culture grew out E. coli.  Past Medical History:  Diagnosis Date   Diverticulosis    Fibrocystic breast disease    GERD (gastroesophageal reflux disease)    History of kidney stones    Hypercholesterolemia    Hypertension    Hypertension    IBS (irritable bowel syndrome)    Inflammatory arthritis    positive anti-CCP abs, s/p prednisone, MTX, Remicade   Nephrolithiasis    Osteopenia    GI upset with Fosamax   Pancreatitis 2007   s/p ERCP   Pancreatitis    PONV (postoperative nausea and vomiting)    Renal insufficiency    Spinal stenosis of lumbosacral region     Patient Active Problem List   Diagnosis Date Noted   Pancreatic cyst 09/12/2020   Aortic atherosclerosis ([place]) 07/30/2020   Diarrhea 07/10/2020   Right shoulder pain 02/19/2019   Rash 02/19/2019   MGUS (monoclonal gammopathy of unknown significance) 01/19/2018   Cramps, extremity 12/02/2017   Abdominal bruit 01/16/2017   Lumbar stenosis 08/18/2016   Abdominal pain 12/03/2015   Abnormal liver function tests 10/18/2015   Anemia 10/18/2015   Hyponatremia 10/07/2015   Lip lesion 04/23/2015   Scalp lesion 04/23/2015   Loss of weight 04/23/2015   Stress 04/23/2015   Left shoulder pain 12/20/2014   UTI (urinary tract  infection) 08/20/2014   Health care maintenance 08/20/2014   Skin lesion 04/17/2014   Fatigue 04/13/2014   Shoulder pain, right 12/17/2013   Trigger finger 08/21/2013   Left hip pain 04/17/2013   GERD (gastroesophageal reflux disease) 04/03/2012   Rheumatoid arthritis ([place]) 03/30/2012   Diverticulosis 03/30/2012   Osteopenia 03/30/2012   Hypertension 03/30/2012   Hypercholesterolemia 03/30/2012    Past Surgical History:  Procedure Laterality Date   ABDOMINAL HYSTERECTOMY  1993   ovaries not removed   BREAST BIOPSY Left 1984   negative   BREAST CYST EXCISION Left 2010   negative   LITHOTRIPSY     LUMBAR LAMINECTOMY  1983   LUMBAR LAMINECTOMY/DECOMPRESSION MICRODISCECTOMY N/A 08/18/2016   Procedure: LUMBAR LAMINECTOMY/DECOMPRESSION MICRODISCECTOMY 1 LEVEL;  Surgeon: Bayard Hugger, MD;  Location: [facility] ORS;  Service: Neurosurgery;  Laterality: N/A;  L4-5 Laminectomy, MIS, L5 foraminotomy   TRIGGER FINGER RELEASE     right ring finger    OB History   No obstetric history on file.      Home Medications    Prior to Admission medications   Medication Sig Start Date End Date Taking? Authorizing Provider  Cholecalciferol (VITAMIN D) 2000 UNITS tablet Take 2,000 Units by mouth daily.   Yes [provider]  clotrimazole-betamethasone (LOTRISONE) cream Apply 1 application topically 2 (two) times daily. 05/21/18  Yes Einar Pheasant,  MD  cyanocobalamin 1000 MCG tablet Take 1,000 mcg by mouth daily.   Yes [provider]  DILT-XR 240 MG 24 hr capsule TAKE 1 CAPSULE DAILY 06/18/20  Yes Einar Pheasant, MD  dorzolamide-timolol (COSOPT) 22.3-6.8 MG/ML ophthalmic solution INSTILL 1 DROP INTO EACH EYE TWICE DAILY 12/17/17  Yes [provider]  ferrous sulfate 325 (65 FE) MG EC tablet Take 325 mg by mouth 3 (three) times daily with meals.   Yes [provider]  fluticasone (FLONASE) 50 MCG/ACT nasal spray USE TWO SPRAY(S) IN EACH NOSTRIL ONCE DAILY 01/14/17  Yes  Einar Pheasant, MD  folic acid (FOLVITE) 1 MG tablet Take 1 mg by mouth 2 (two) times daily. 12/14/17  Yes [provider]  gabapentin (NEURONTIN) 300 MG capsule TAKE 1 CAPSULE AT BEDTIME 06/18/20  Yes Einar Pheasant, MD  InFLIXimab (REMICADE IV) Inject into the vein every 6 (six) weeks. Per Dr Jefm Bryant   Yes [provider]  latanoprost (XALATAN) 0.005 % ophthalmic solution  01/18/18  Yes [provider]  losartan (COZAAR) 100 MG tablet TAKE 1 TABLET DAILY 10/15/20  Yes Einar Pheasant, MD  lovastatin (MEVACOR) 40 MG tablet TAKE 1 TABLET DAILY 09/13/20  Yes Einar Pheasant, MD  methotrexate 25 MG/ML SOLN Inject 17.5 mg into the skin once a week. Patient takes 17.5 mg (0.7 ml) on Monday or Tuesday of each week.   Yes [provider]  nitrofurantoin, macrocrystal-monohydrate, (MACROBID) 100 MG capsule Take 1 capsule (100 mg total) by mouth 2 (two) times daily. 11/04/20  Yes Margarette Canada, NP  omeprazole (PRILOSEC) 20 MG capsule TAKE 1 CAPSULE DAILY 07/06/20  Yes Einar Pheasant, MD  ondansetron (ZOFRAN) 4 MG tablet Take 1 tablet (4 mg total) by mouth every 6 (six) hours as needed for nausea or vomiting. 08/19/16  Yes Bayard Hugger, MD  phenazopyridine (PYRIDIUM) 200 MG tablet Take 1 tablet (200 mg total) by mouth 3 (three) times daily. 11/04/20  Yes Margarette Canada, NP  polyethylene glycol (MIRALAX / GLYCOLAX) packet Take 17 g by mouth daily.   Yes [provider]  Probiotic Product (ALIGN) 4 MG CAPS Take one capsule daily 03/11/19  Yes Einar Pheasant, MD  psyllium (METAMUCIL) 58.6 % packet Take 1 packet by mouth daily.   Yes [provider]  triamcinolone cream (KENALOG) 0.1 % Apply 1 application topically 2 (two) times daily. 02/18/19  Yes Einar Pheasant, MD  triamterene-hydrochlorothiazide (MAXZIDE-25) 37.5-25 MG tablet TAKE 1 TABLET DAILY 07/06/20  Yes Einar Pheasant, MD  zoledronic acid (RECLAST) 5 MG/100ML SOLN injection Inject 5 mg into the vein once.    Yes [provider]    Family History Family History  Problem Relation Age of Onset   Ovarian cancer Mother    Renal Disease Mother    Heart disease Mother    Diabetes Mother    Hypertension Mother    Heart disease Father    Breast cancer Neg Hx     Social History Social History   Tobacco Use   Smoking status: Never   Smokeless tobacco: Never  Vaping Use   Vaping Use: Never used  Substance Use Topics   Alcohol use: No    Alcohol/week: 0.0 standard drinks   Drug use: No     Allergies   Carisoprodol, Metronidazole, Codeine, Nsaids, Skelaxin [metaxalone], Tenormin [atenolol], and Tramadol   Review of Systems Review of Systems  Constitutional:  Negative for appetite change and fever.  Gastrointestinal:  Positive for abdominal pain. Negative for diarrhea,  nausea and vomiting.  Genitourinary:  Positive for dysuria, frequency and urgency. Negative for hematuria.  Musculoskeletal:  Positive for back pain.    Physical Exam Triage Vital Signs ED Triage Vitals  Enc Vitals Group     BP 11/04/20 0848 (!) 163/60     Pulse Rate 11/04/20 0848 60     Resp 11/04/20 0848 16     Temp 11/04/20 0848 98.2 F (36.8 C)     Temp Source 11/04/20 0848 Oral     SpO2 11/04/20 0848 99 %     Weight 11/04/20 0849 105 lb (47.6 kg)     Height --      Head Circumference --      Peak Flow --      Pain Score 11/04/20 0849 0     Pain Loc --      Pain Edu? --      Excl. in [place]? --    No data found.  Updated Vital Signs BP (!) 163/60 (BP Location: Left Arm)   Pulse 60   Temp 98.2 F (36.8 C) (Oral)   Resp 16   Wt 105 lb (47.6 kg)   SpO2 99%   BMI 19.84 kg/m   Visual Acuity Right Eye Distance:   Left Eye Distance:   Bilateral Distance:    Right Eye Near:   Left Eye Near:    Bilateral Near:     Physical Exam Vitals and nursing note reviewed.  Constitutional:      Appearance: Normal appearance. She is normal weight. She is not ill-appearing.  HENT:     Head:  Normocephalic and atraumatic.  Cardiovascular:     Rate and Rhythm: Normal rate and regular rhythm.     Pulses: Normal pulses.     Heart sounds: Normal heart sounds. No murmur heard.   No gallop.  Pulmonary:     Effort: Pulmonary effort is normal.     Breath sounds: Normal breath sounds. No wheezing, rhonchi or rales.  Abdominal:     General: Abdomen is flat.     Palpations: Abdomen is soft.     Tenderness: There is no abdominal tenderness. There is no right CVA tenderness or left CVA tenderness.  Skin:    General: Skin is warm and dry.     Capillary Refill: Capillary refill takes less than 2 seconds.     Findings: No erythema or rash.  Neurological:     General: No focal deficit present.     Mental Status: She is alert and oriented to person, place, and time.  Psychiatric:        Mood and Affect: Mood normal.        Behavior: Behavior normal.        Thought Content: Thought content normal.        Judgment: Judgment normal.     UC Treatments / Results  Labs (all labs ordered are listed, but only abnormal results are displayed) Labs Reviewed  POCT URINALYSIS DIP (DEVICE) - Abnormal; Notable for the following components:      Result Value   Hgb urine dipstick TRACE (*)    Leukocytes,Ua SMALL (*)    All other components within normal limits  URINE CULTURE  POCT URINALYSIS DIPSTICK, ED / UC    EKG   Radiology No results found.  Procedures Procedures (including critical care time)  Medications Ordered in UC Medications - No data to display  Initial Impression / Assessment and Plan / UC Course  I  have reviewed the triage vital signs and the nursing notes.  Pertinent labs & imaging results that were available during my care of the patient were reviewed by me and considered in my medical decision making (see chart for details).  Patient is a very pleasant and nontoxic-appearing 87 year old female here for evaluation of UTI symptoms as outlined in HPI above.   Patient's physical exam reveals a benign cardiopulmonary exam.  Patient's abdomen is soft, flat, nontender and patient has no CVA tenderness.  Urine dip shows the presence of trace hemoglobin and small leukocytes but is nitrite negative.  Patient does have a history of recurrent UTIs that are E. coli positive.  Will send urine for culture and treat patient for UTI with Macrobid twice daily for 5 days and Pyridium to help with urinary discomfort.   Final Clinical Impressions(s) / UC Diagnoses   Final diagnoses:  Acute cystitis without hematuria     Discharge Instructions      Take the Macrobid twice daily for 5 days with food for treatment of urinary tract infection.  Use the Pyridium every 8 hours as needed for urinary discomfort.  This will turn your urine a bright red-orange.  Increase your oral fluid intake so that you increase your urine production and or flushing your urinary system.  Take an over-the-counter probiotic, such as Culturelle-Align-Activia, 1 hour after each dose of antibiotic to prevent diarrhea or yeast infections from forming.  We will culture urine and change the antibiotics if necessary.  Return for reevaluation, or see your primary care provider, for any new or worsening symptoms.      ED Prescriptions     Medication Sig Dispense Auth. Provider   nitrofurantoin, macrocrystal-monohydrate, (MACROBID) 100 MG capsule Take 1 capsule (100 mg total) by mouth 2 (two) times daily. 10 capsule Margarette Canada, NP   phenazopyridine (PYRIDIUM) 200 MG tablet Take 1 tablet (200 mg total) by mouth 3 (three) times daily. 6 tablet Margarette Canada, NP      PDMP not reviewed this encounter.   Margarette Canada, NP 11/04/20 [phone]

## 2020-11-04 NOTE — Discharge Instructions (Addendum)

## 2020-11-07 DIAGNOSIS — H4043X4 Glaucoma secondary to eye inflammation, bilateral, indeterminate stage: Secondary | ICD-10-CM | POA: Diagnosis not present

## 2020-11-07 LAB — URINE CULTURE: Culture: >=100000 — AB

## 2020-11-15 ENCOUNTER — Ambulatory Visit (INDEPENDENT_AMBULATORY_CARE_PROVIDER_SITE_OTHER): Payer: Medicare Other | Admitting: Internal Medicine

## 2020-11-15 ENCOUNTER — Encounter: Payer: Self-pay | Admitting: Internal Medicine

## 2020-11-15 ENCOUNTER — Other Ambulatory Visit: Payer: Self-pay

## 2020-11-15 DIAGNOSIS — M069 Rheumatoid arthritis, unspecified: Secondary | ICD-10-CM

## 2020-11-15 DIAGNOSIS — E7800 Pure hypercholesterolemia, unspecified: Secondary | ICD-10-CM

## 2020-11-15 DIAGNOSIS — R634 Abnormal weight loss: Secondary | ICD-10-CM | POA: Diagnosis not present

## 2020-11-15 DIAGNOSIS — K219 Gastro-esophageal reflux disease without esophagitis: Secondary | ICD-10-CM | POA: Diagnosis not present

## 2020-11-15 DIAGNOSIS — K862 Cyst of pancreas: Secondary | ICD-10-CM | POA: Diagnosis not present

## 2020-11-15 DIAGNOSIS — D472 Monoclonal gammopathy: Secondary | ICD-10-CM

## 2020-11-15 DIAGNOSIS — F439 Reaction to severe stress, unspecified: Secondary | ICD-10-CM | POA: Diagnosis not present

## 2020-11-15 DIAGNOSIS — R7989 Other specified abnormal findings of blood chemistry: Secondary | ICD-10-CM

## 2020-11-15 DIAGNOSIS — E871 Hypo-osmolality and hyponatremia: Secondary | ICD-10-CM | POA: Diagnosis not present

## 2020-11-15 DIAGNOSIS — D649 Anemia, unspecified: Secondary | ICD-10-CM | POA: Diagnosis not present

## 2020-11-15 DIAGNOSIS — I10 Essential (primary) hypertension: Secondary | ICD-10-CM

## 2020-11-15 DIAGNOSIS — R945 Abnormal results of liver function studies: Secondary | ICD-10-CM

## 2020-11-15 DIAGNOSIS — I700 Atherosclerosis of aorta: Secondary | ICD-10-CM | POA: Diagnosis not present

## 2020-11-15 MED ORDER — MUPIROCIN 2 % EX OINT: 1.0000 "application " | TOPICAL_OINTMENT | Freq: Two times a day (BID) | CUTANEOUS | 0 refills | Status: DC

## 2020-11-15 NOTE — Progress Notes (Signed)
Patient ID: Stacey Snyder, female   DOB: 07-07-1932, 87 y.o.   MRN: [id number]   Subjective:    Patient ID: Stacey Snyder, female    DOB: 11-07-1932, 87 y.o.   MRN: [id number]  HPI This visit occurred during the SARS-CoV-2 public health emergency.  Safety protocols were in place, including screening questions prior to the visit, additional usage of staff PPE, and extensive cleaning of exam room while observing appropriate contact time as indicated for disinfecting solutions.   Patient here for a scheduled follow up.  Here to follow up regarding her blood pressure, cholesterol, GI issues and her back pain.  She underwent right L2-3 and L4-5 epidural injection on October 09, 2020.  Noticed improvement in her back pain.  Had follow-up November 08, 2020.  Elected to hold on further injections.  Was seen July 3 in [facility] urgent care.  Was diagnosed with urinary tract infection.  Was treated with Macrobid and pyridium.  Urinary symptoms have resolved.  She is eating.  Weight is stable.  No chest pain or sob reported.  No abdominal pain.  Bowels doing well.  Some fatigue, but feels is stable.  Still with increased stress - still trying to cope with her husband's passing.  She did cut her finger - on the window - chasing a fly.  Has been cleaning the area and bandage in place.  Blood pressure doing well.  Is getting out some with her dog.    Past Medical History:  Diagnosis Date   Diverticulosis    Fibrocystic breast disease    GERD (gastroesophageal reflux disease)    History of kidney stones    Hypercholesterolemia    Hypertension    Hypertension    IBS (irritable bowel syndrome)    Inflammatory arthritis    positive anti-CCP abs, s/p prednisone, MTX, Remicade   Nephrolithiasis    Osteopenia    GI upset with Fosamax   Pancreatitis 2007   s/p ERCP   Pancreatitis    PONV (postoperative nausea and vomiting)    Renal insufficiency    Spinal stenosis of lumbosacral region    Past  Surgical History:  Procedure Laterality Date   ABDOMINAL HYSTERECTOMY  1993   ovaries not removed   BREAST BIOPSY Left 1984   negative   BREAST CYST EXCISION Left 2010   negative   LITHOTRIPSY     LUMBAR LAMINECTOMY  1983   LUMBAR LAMINECTOMY/DECOMPRESSION MICRODISCECTOMY N/A 08/18/2016   Procedure: LUMBAR LAMINECTOMY/DECOMPRESSION MICRODISCECTOMY 1 LEVEL;  Surgeon: Bayard Hugger, MD;  Location: [facility] OR[facility];  Service: Neurosurgery;  Laterality: N/A;  L4-5 Laminectomy, MIS, L5 foraminotomy   TRIGGER FINGER RELEASE     right ring finger   Family History  Problem Relation Age of Onset   Ovarian cancer Mother    Renal Disease Mother    Heart disease Mother    Diabetes Mother    Hypertension Mother    Heart disease Father    Breast cancer Neg Hx    Social History   Socioeconomic History   Marital status: Married    Spouse name: Not on file   Number of children: 4   Years of education: Not on file   Highest education level: Not on file  Occupational History   Not on file  Tobacco Use   Smoking status: Never   Smokeless tobacco: Never  Vaping Use   Vaping Use: Never used  Substance and Sexual Activity   Alcohol use: No  Alcohol/week: 0.0 standard drinks   Drug use: No   Sexual activity: Not on file  Other Topics Concern   Not on file  Social History Narrative   Not on file   Social Determinants of Health   Financial Resource Strain: Not on file  Food Insecurity: Not on file  Transportation Needs: Not on file  Physical Activity: Not on file  Stress: Not on file  Social Connections: Not on file    Review of Systems  Constitutional:  Negative for appetite change and unexpected weight change.  HENT:  Negative for congestion and sinus pressure.   Respiratory:  Negative for cough, chest tightness and shortness of breath.   Cardiovascular:  Negative for chest pain, palpitations and leg swelling.  Gastrointestinal:  Negative for abdominal pain, diarrhea, nausea and  vomiting.  Genitourinary:  Negative for difficulty urinating and dysuria.       No urinary symptoms currently.  Resolved.    Musculoskeletal:  Negative for joint swelling and myalgias.       Back is doing better.   Skin:  Negative for color change and rash.  Neurological:  Negative for dizziness, light-headedness and headaches.  Psychiatric/Behavioral:  Negative for agitation and dysphoric mood.       Objective:    Physical Exam Vitals reviewed.  Constitutional:      General: She is not in acute distress.    Appearance: Normal appearance.  HENT:     Head: Normocephalic and atraumatic.     Right Ear: External ear normal.     Left Ear: External ear normal.  Eyes:     General: No scleral icterus.       Right eye: No discharge.        Left eye: No discharge.     Conjunctiva/sclera: Conjunctivae normal.  Neck:     Thyroid: No thyromegaly.  Cardiovascular:     Rate and Rhythm: Normal rate and regular rhythm.  Pulmonary:     Effort: No respiratory distress.     Breath sounds: Normal breath sounds. No wheezing.  Abdominal:     General: Bowel sounds are normal.     Palpations: Abdomen is soft.     Tenderness: There is no abdominal tenderness.  Musculoskeletal:        General: No swelling or tenderness.     Cervical back: Neck supple. No tenderness.  Lymphadenopathy:     Cervical: No cervical adenopathy.  Skin:    Findings: No erythema or rash.  Neurological:     Mental Status: She is alert.  Psychiatric:        Mood and Affect: Mood normal.        Behavior: Behavior normal.    BP 130/60   Pulse 61   Temp 98.1 F (36.7 C) (Oral)   Ht 5' 1"  (1.549 m)   Wt 105 lb (47.6 kg)   BMI 19.84 kg/m  Wt Readings from Last 3 Encounters:  11/15/20 105 lb (47.6 kg)  11/04/20 105 lb (47.6 kg)  09/03/20 105 lb (47.6 kg)    Outpatient Encounter Medications as of 11/15/2020  Medication Sig   Cholecalciferol (VITAMIN D) 2000 UNITS tablet Take 2,000 Units by mouth daily.    cyanocobalamin 1000 MCG tablet Take 1,000 mcg by mouth daily.   DILT-XR 240 MG 24 hr capsule TAKE 1 CAPSULE DAILY   dorzolamide-timolol (COSOPT) 22.3-6.8 MG/ML ophthalmic solution INSTILL 1 DROP INTO EACH EYE TWICE DAILY   ferrous sulfate 325 (65 FE) MG EC tablet  Take 325 mg by mouth 3 (three) times daily with meals.   fluticasone (FLONASE) 50 MCG/ACT nasal spray USE TWO SPRAY(S) IN EACH NOSTRIL ONCE DAILY   folic acid (FOLVITE) 1 MG tablet Take 1 mg by mouth 2 (two) times daily.   gabapentin (NEURONTIN) 300 MG capsule TAKE 1 CAPSULE AT BEDTIME   InFLIXimab (REMICADE IV) Inject into the vein every 6 (six) weeks. Per Dr Jefm Bryant   latanoprost (XALATAN) 0.005 % ophthalmic solution    losartan (COZAAR) 100 MG tablet TAKE 1 TABLET DAILY   lovastatin (MEVACOR) 40 MG tablet TAKE 1 TABLET DAILY   methotrexate 25 MG/ML SOLN Inject 17.5 mg into the skin once a week. Patient takes 17.5 mg (0.7 ml) on Monday or Tuesday of each week.   mupirocin ointment (BACTROBAN) 2 % Apply 1 application topically 2 (two) times daily.   omeprazole (PRILOSEC) 20 MG capsule TAKE 1 CAPSULE DAILY   ondansetron (ZOFRAN) 4 MG tablet Take 1 tablet (4 mg total) by mouth every 6 (six) hours as needed for nausea or vomiting.   phenazopyridine (PYRIDIUM) 200 MG tablet Take 1 tablet (200 mg total) by mouth 3 (three) times daily.   polyethylene glycol (MIRALAX / GLYCOLAX) packet Take 17 g by mouth daily.   Probiotic Product (ALIGN) 4 MG CAPS Take one capsule daily   psyllium (METAMUCIL) 58.6 % packet Take 1 packet by mouth daily.   triamcinolone cream (KENALOG) 0.1 % Apply 1 application topically 2 (two) times daily.   triamterene-hydrochlorothiazide (MAXZIDE-25) 37.5-25 MG tablet TAKE 1 TABLET DAILY   zoledronic acid (RECLAST) 5 MG/100ML SOLN injection Inject 5 mg into the vein once.   [DISCONTINUED] clotrimazole-betamethasone (LOTRISONE) cream Apply 1 application topically 2 (two) times daily. (Patient not taking: Reported on  11/15/2020)   [DISCONTINUED] nitrofurantoin, macrocrystal-monohydrate, (MACROBID) 100 MG capsule Take 1 capsule (100 mg total) by mouth 2 (two) times daily. (Patient not taking: Reported on 11/15/2020)   No facility-administered encounter medications on file as of 11/15/2020.     Lab Results  Component Value Date   WBC 6.6 09/03/2020   HGB 11.6 (L) 09/03/2020   HCT 35.2 (L) 09/03/2020   PLT 263.0 09/03/2020   GLUCOSE 106 (H) 09/03/2020   CHOL 195 04/17/2020   TRIG 78.0 04/17/2020   HDL 96.30 04/17/2020   LDLCALC 83 04/17/2020   ALT 32 07/12/2020   AST 20 07/12/2020   NA 135 09/18/2020   K 3.6 09/03/2020   CL 94 (L) 09/03/2020   CREATININE 0.96 09/03/2020   BUN 19 09/03/2020   CO2 32 09/03/2020   TSH 1.74 04/17/2020   INR 0.92 08/07/2016       Assessment & Plan:   Problem List Items Addressed This Visit     Abnormal liver function tests    Liver function rechecked and was within normal limits.       Anemia    Follow CBC.       Relevant Orders   CBC with Differential/Platelet   IBC + Ferritin   Aortic atherosclerosis (HCC)    Continue lovastatin.        GERD (gastroesophageal reflux disease)    No acid reflux, nausea or vomiting reported.  Continues on prilosec.         Hypercholesterolemia    Continue lovastatin.  Follow lipid panel and liver function tests.        Relevant Orders   Hepatic function panel   Lipid panel   Hypertension    She is currently  on losartan and triam/hctz (1/2 tablet).  Blood pressure as outlined.  Doing well.  Follow pressures.  Follow metabolic panel.  Outside blood pressure readings averaging 128- 130/ 60s.       Relevant Orders   Basic metabolic panel   Hyponatremia    Last sodium check within normal limits.  Follow electrolytes.       Loss of weight    Weight stable from previous check.  Had CT scan.  Eating better.  Reports bowels are doing well.  Follow-up.       MGUS (monoclonal gammopathy of unknown  significance)    Discussed need for follow-up with hematology.  See previous phone conversation.  She declines at this time.  Notify me when agreeable.       Relevant Orders   Multiple Myeloma Panel (SPEP&IFE w/QIG)   Pancreatic cyst    Evaluated by GI 09/10/20 - pancreatic cyst seen on CT - plan MRI 1 year.        Rheumatoid arthritis ([place])    Receiving remicade injections.  Followed by Dr Jefm Bryant.         Stress    Discussed increased stress.  She has good support.  Does not feel needs any further intervention.  Follow.          Einar Pheasant, MD

## 2020-11-26 NOTE — Assessment & Plan Note (Signed)
Continue lovastatin.  Follow lipid panel and liver function tests.   

## 2020-11-26 NOTE — Assessment & Plan Note (Signed)
Discussed need for follow-up with hematology.  See previous phone conversation.  She declines at this time.  Notify me when agreeable.

## 2020-11-26 NOTE — Assessment & Plan Note (Signed)
Receiving remicade injections.  Followed by Dr Jefm Bryant.

## 2020-11-26 NOTE — Assessment & Plan Note (Signed)
Evaluated by GI 09/10/20 - pancreatic cyst seen on CT - plan MRI 1 year.

## 2020-11-26 NOTE — Assessment & Plan Note (Signed)
Last sodium check within normal limits.  Follow electrolytes.

## 2020-11-26 NOTE — Assessment & Plan Note (Signed)
Liver function rechecked and was within normal limits.

## 2020-11-26 NOTE — Assessment & Plan Note (Signed)
Discussed increased stress.  She has good support.  Does not feel needs any further intervention.  Follow.

## 2020-11-26 NOTE — Assessment & Plan Note (Signed)
No acid reflux, nausea or vomiting reported.  Continues on prilosec.

## 2020-11-26 NOTE — Assessment & Plan Note (Addendum)
She is currently on losartan and triam/hctz (1/2 tablet).  Blood pressure as outlined.  Doing well.  Follow pressures.  Follow metabolic panel.  Outside blood pressure readings averaging 128- 130/ 60s.

## 2020-11-26 NOTE — Assessment & Plan Note (Signed)
Continue lovastatin 

## 2020-11-26 NOTE — Assessment & Plan Note (Signed)
Follow CBC. 

## 2020-11-26 NOTE — Assessment & Plan Note (Signed)
Weight stable from previous check.  Had CT scan.  Eating better.  Reports bowels are doing well.  Follow-up.

## 2020-12-01 ENCOUNTER — Other Ambulatory Visit: Payer: Self-pay

## 2020-12-01 ENCOUNTER — Encounter: Payer: Self-pay | Admitting: Intensive Care

## 2020-12-01 ENCOUNTER — Emergency Department: Payer: No Typology Code available for payment source

## 2020-12-01 ENCOUNTER — Emergency Department
Admission: EM | Admit: 2020-12-01 | Discharge: 2020-12-01 | Disposition: A | Discharge status: Home / Self Care | Payer: No Typology Code available for payment source | Attending: Emergency Medicine | Admitting: Emergency Medicine

## 2020-12-01 DIAGNOSIS — M25512 Pain in left shoulder: Secondary | ICD-10-CM | POA: Insufficient documentation

## 2020-12-01 DIAGNOSIS — Z79899 Other long term (current) drug therapy: Secondary | ICD-10-CM | POA: Insufficient documentation

## 2020-12-01 DIAGNOSIS — M79632 Pain in left forearm: Secondary | ICD-10-CM | POA: Diagnosis not present

## 2020-12-01 DIAGNOSIS — I10 Essential (primary) hypertension: Secondary | ICD-10-CM | POA: Diagnosis not present

## 2020-12-01 DIAGNOSIS — M79602 Pain in left arm: Secondary | ICD-10-CM | POA: Diagnosis not present

## 2020-12-01 DIAGNOSIS — Y92410 Unspecified street and highway as the place of occurrence of the external cause: Secondary | ICD-10-CM | POA: Diagnosis not present

## 2020-12-01 DIAGNOSIS — M25522 Pain in left elbow: Secondary | ICD-10-CM | POA: Diagnosis not present

## 2020-12-01 DIAGNOSIS — M79622 Pain in left upper arm: Secondary | ICD-10-CM | POA: Diagnosis not present
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
## 2020-12-03 ENCOUNTER — Other Ambulatory Visit: Payer: Self-pay

## 2020-12-03 ENCOUNTER — Inpatient Hospital Stay
Admission: EM | Admit: 2020-12-03 | Discharge: 2020-12-05 | DRG: 378 | Disposition: A | Discharge status: Home / Self Care | Payer: Medicare Other | Attending: Internal Medicine | Admitting: Internal Medicine

## 2020-12-03 ENCOUNTER — Encounter: Payer: Self-pay | Admitting: Internal Medicine

## 2020-12-03 ENCOUNTER — Emergency Department: Payer: Medicare Other

## 2020-12-03 DIAGNOSIS — R42 Dizziness and giddiness: Secondary | ICD-10-CM | POA: Diagnosis present

## 2020-12-03 DIAGNOSIS — K5731 Diverticulosis of large intestine without perforation or abscess with bleeding: Principal | ICD-10-CM | POA: Diagnosis present

## 2020-12-03 DIAGNOSIS — E871 Hypo-osmolality and hyponatremia: Secondary | ICD-10-CM | POA: Diagnosis present

## 2020-12-03 DIAGNOSIS — M4807 Spinal stenosis, lumbosacral region: Secondary | ICD-10-CM | POA: Diagnosis present

## 2020-12-03 DIAGNOSIS — I10 Essential (primary) hypertension: Secondary | ICD-10-CM | POA: Diagnosis not present

## 2020-12-03 DIAGNOSIS — Z20822 Contact with and (suspected) exposure to covid-19: Secondary | ICD-10-CM | POA: Diagnosis present

## 2020-12-03 DIAGNOSIS — I700 Atherosclerosis of aorta: Secondary | ICD-10-CM | POA: Diagnosis not present

## 2020-12-03 DIAGNOSIS — Z841 Family history of disorders of kidney and ureter: Secondary | ICD-10-CM

## 2020-12-03 DIAGNOSIS — Z90710 Acquired absence of both cervix and uterus: Secondary | ICD-10-CM

## 2020-12-03 DIAGNOSIS — K625 Hemorrhage of anus and rectum: Secondary | ICD-10-CM | POA: Diagnosis not present

## 2020-12-03 DIAGNOSIS — Z8249 Family history of ischemic heart disease and other diseases of the circulatory system: Secondary | ICD-10-CM

## 2020-12-03 DIAGNOSIS — K862 Cyst of pancreas: Secondary | ICD-10-CM

## 2020-12-03 DIAGNOSIS — K219 Gastro-esophageal reflux disease without esophagitis: Secondary | ICD-10-CM | POA: Diagnosis present

## 2020-12-03 DIAGNOSIS — K922 Gastrointestinal hemorrhage, unspecified: Secondary | ICD-10-CM

## 2020-12-03 DIAGNOSIS — Z886 Allergy status to analgesic agent status: Secondary | ICD-10-CM

## 2020-12-03 DIAGNOSIS — Z87442 Personal history of urinary calculi: Secondary | ICD-10-CM

## 2020-12-03 DIAGNOSIS — E7800 Pure hypercholesterolemia, unspecified: Secondary | ICD-10-CM | POA: Diagnosis present

## 2020-12-03 DIAGNOSIS — Z041 Encounter for examination and observation following transport accident: Secondary | ICD-10-CM | POA: Diagnosis not present

## 2020-12-03 DIAGNOSIS — M069 Rheumatoid arthritis, unspecified: Secondary | ICD-10-CM | POA: Diagnosis present

## 2020-12-03 DIAGNOSIS — T502X5A Adverse effect of carbonic-anhydrase inhibitors, benzothiadiazides and other diuretics, initial encounter: Secondary | ICD-10-CM | POA: Diagnosis present

## 2020-12-03 DIAGNOSIS — E876 Hypokalemia: Secondary | ICD-10-CM | POA: Diagnosis present

## 2020-12-03 DIAGNOSIS — H409 Unspecified glaucoma: Secondary | ICD-10-CM | POA: Diagnosis present

## 2020-12-03 DIAGNOSIS — Z888 Allergy status to other drugs, medicaments and biological substances status: Secondary | ICD-10-CM

## 2020-12-03 DIAGNOSIS — N200 Calculus of kidney: Secondary | ICD-10-CM | POA: Diagnosis not present

## 2020-12-03 DIAGNOSIS — Z79899 Other long term (current) drug therapy: Secondary | ICD-10-CM

## 2020-12-03 DIAGNOSIS — M8580 Other specified disorders of bone density and structure, unspecified site: Secondary | ICD-10-CM | POA: Diagnosis present

## 2020-12-03 DIAGNOSIS — Z833 Family history of diabetes mellitus: Secondary | ICD-10-CM

## 2020-12-03 DIAGNOSIS — K641 Second degree hemorrhoids: Secondary | ICD-10-CM | POA: Diagnosis present

## 2020-12-03 DIAGNOSIS — Z885 Allergy status to narcotic agent status: Secondary | ICD-10-CM

## 2020-12-03 DIAGNOSIS — Z8041 Family history of malignant neoplasm of ovary: Secondary | ICD-10-CM

## 2020-12-03 LAB — CBC WITH DIFFERENTIAL/PLATELET
Abs Immature Granulocytes: 0.01 10*3/uL (ref 0.00–0.07)
Basophils Absolute: 0.1 10*3/uL (ref 0.0–0.1)
Basophils Relative: 1 %
Eosinophils Absolute: 0.2 10*3/uL (ref 0.0–0.5)
Eosinophils Relative: 2 %
HCT: 34.3 % — ABNORMAL LOW (ref 36.0–46.0)
Hemoglobin: 12.1 g/dL (ref 12.0–15.0)
Immature Granulocytes: 0 %
Lymphocytes Relative: 36 %
Lymphs Abs: 3.4 10*3/uL (ref 0.7–4.0)
MCH: 32.7 pg (ref 26.0–34.0)
MCHC: 35.3 g/dL (ref 30.0–36.0)
MCV: 92.7 fL (ref 80.0–100.0)
Monocytes Absolute: 1.1 10*3/uL — ABNORMAL HIGH (ref 0.1–1.0)
Monocytes Relative: 12 %
Neutro Abs: 4.5 10*3/uL (ref 1.7–7.7)
Neutrophils Relative %: 49 %
Platelets: 266 10*3/uL (ref 150–400)
RBC: 3.7 MIL/uL — ABNORMAL LOW (ref 3.87–5.11)
RDW: 13.7 % (ref 11.5–15.5)
WBC: 9.2 10*3/uL (ref 4.0–10.5)
nRBC: 0 % (ref 0.0–0.2)

## 2020-12-03 LAB — COMPREHENSIVE METABOLIC PANEL
ALT: 14 U/L (ref 0–44)
AST: 25 U/L (ref 15–41)
Albumin: 4.2 g/dL (ref 3.5–5.0)
Alkaline Phosphatase: 78 U/L (ref 38–126)
Anion gap: 10 (ref 5–15)
BUN: 31 mg/dL — ABNORMAL HIGH (ref 8–23)
CO2: 26 mmol/L (ref 22–32)
Calcium: 9.4 mg/dL (ref 8.9–10.3)
Chloride: 96 mmol/L — ABNORMAL LOW (ref 98–111)
Creatinine, Ser: 1.02 mg/dL — ABNORMAL HIGH (ref 0.44–1.00)
GFR, Estimated: 54 mL/min — ABNORMAL LOW (ref >60)
Glucose, Bld: 120 mg/dL — ABNORMAL HIGH (ref 70–99)
Potassium: 3.4 mmol/L — ABNORMAL LOW (ref 3.5–5.1)
Sodium: 132 mmol/L — ABNORMAL LOW (ref 135–145)
Total Bilirubin: 0.7 mg/dL (ref 0.3–1.2)
Total Protein: 7.3 g/dL (ref 6.5–8.1)

## 2020-12-03 LAB — RESP PANEL BY RT-PCR (FLU A&B, COVID) ARPGX2
Influenza A by PCR: NEGATIVE
Influenza B by PCR: NEGATIVE
SARS Coronavirus 2 by RT PCR: NEGATIVE

## 2020-12-03 LAB — CBC
HCT: 36 % (ref 36.0–46.0)
Hemoglobin: 12.5 g/dL (ref 12.0–15.0)
MCH: 32.6 pg (ref 26.0–34.0)
MCHC: 34.7 g/dL (ref 30.0–36.0)
MCV: 93.8 fL (ref 80.0–100.0)
Platelets: 287 10*3/uL (ref 150–400)
RBC: 3.84 MIL/uL — ABNORMAL LOW (ref 3.87–5.11)
RDW: 13.7 % (ref 11.5–15.5)
WBC: 8.5 10*3/uL (ref 4.0–10.5)
nRBC: 0 % (ref 0.0–0.2)

## 2020-12-03 LAB — TYPE AND SCREEN
ABO/RH(D): O POS
Antibody Screen: NEGATIVE

## 2020-12-03 LAB — PROTIME-INR
INR: 1 (ref 0.8–1.2)
Prothrombin Time: 13.3 seconds (ref 11.4–15.2)

## 2020-12-03 LAB — PHOSPHORUS: Phosphorus: 3.5 mg/dL (ref 2.5–4.6)

## 2020-12-03 LAB — MAGNESIUM: Magnesium: 1.9 mg/dL (ref 1.7–2.4)

## 2020-12-03 MED ORDER — DORZOLAMIDE HCL-TIMOLOL MAL 2-0.5 % OP SOLN
1.0000 [drp] | Freq: Two times a day (BID) | OPHTHALMIC | Status: DC
  Administered 2020-12-03 – 2020-12-05 (×4): 1 [drp] via OPHTHALMIC
  Filled 2020-12-03: qty 10

## 2020-12-03 MED ORDER — POTASSIUM CHLORIDE IN NACL 40-0.9 MEQ/L-% IV SOLN
INTRAVENOUS | Status: DC
  Filled 2020-12-03: qty 1000

## 2020-12-03 MED ORDER — ACETAMINOPHEN 650 MG RE SUPP: 650.0000 mg | Freq: Four times a day (QID) | RECTAL | Status: DC | PRN

## 2020-12-03 MED ORDER — FOLIC ACID 1 MG PO TABS
2.0000 mg | ORAL_TABLET | Freq: Every day | ORAL | Status: DC
  Administered 2020-12-04: 2 mg via ORAL
  Filled 2020-12-03: qty 2

## 2020-12-03 MED ORDER — LATANOPROST 0.005 % OP SOLN
1.0000 [drp] | Freq: Every day | OPHTHALMIC | Status: DC
  Administered 2020-12-03 – 2020-12-04 (×2): 1 [drp] via OPHTHALMIC
  Filled 2020-12-03: qty 2.5

## 2020-12-03 MED ORDER — DILTIAZEM HCL ER 240 MG PO CP24
240.0000 mg | ORAL_CAPSULE | Freq: Every day | ORAL | Status: DC
  Administered 2020-12-04 – 2020-12-05 (×2): 240 mg via ORAL
  Filled 2020-12-03: qty 1
  Filled 2020-12-03: qty 2
  Filled 2020-12-03: qty 1

## 2020-12-03 MED ORDER — PANTOPRAZOLE SODIUM 40 MG PO TBEC
40.0000 mg | DELAYED_RELEASE_TABLET | Freq: Every day | ORAL | Status: DC
  Administered 2020-12-04 – 2020-12-05 (×2): 40 mg via ORAL
  Filled 2020-12-03 (×2): qty 1

## 2020-12-03 MED ORDER — LOSARTAN POTASSIUM 50 MG PO TABS
100.0000 mg | ORAL_TABLET | Freq: Every day | ORAL | Status: DC
  Administered 2020-12-04 – 2020-12-05 (×2): 100 mg via ORAL
  Filled 2020-12-03 (×2): qty 2

## 2020-12-03 MED ORDER — PRAVASTATIN SODIUM 20 MG PO TABS
40.0000 mg | ORAL_TABLET | Freq: Every day | ORAL | Status: DC
  Administered 2020-12-04: 40 mg via ORAL
  Filled 2020-12-03: qty 2

## 2020-12-03 MED ORDER — ACETAMINOPHEN 325 MG PO TABS
650.0000 mg | ORAL_TABLET | Freq: Four times a day (QID) | ORAL | Status: DC | PRN
  Filled 2020-12-03: qty 2

## 2020-12-03 MED ORDER — IOHEXOL 350 MG/ML SOLN
50.0000 mL | Freq: Once | INTRAVENOUS | Status: AC | PRN
  Administered 2020-12-03: 50 mL via INTRAVENOUS

## 2020-12-03 MED ORDER — ONDANSETRON HCL 4 MG/2ML IJ SOLN: 4.0000 mg | Freq: Four times a day (QID) | INTRAMUSCULAR | Status: DC | PRN

## 2020-12-03 MED ORDER — ONDANSETRON HCL 4 MG PO TABS: 4.0000 mg | ORAL_TABLET | Freq: Four times a day (QID) | ORAL | Status: DC | PRN

## 2020-12-03 MED ORDER — GABAPENTIN 300 MG PO CAPS
300.0000 mg | ORAL_CAPSULE | Freq: Every day | ORAL | Status: DC
  Administered 2020-12-03 – 2020-12-04 (×2): 300 mg via ORAL
  Filled 2020-12-03 (×2): qty 1
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
## 2020-12-03 NOTE — ED Notes (Signed)
Pts Son at the bedside and provided an update.
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
## 2020-12-03 NOTE — ED Notes (Signed)
Pt provided a [place] sandwich and milk.

## 2020-12-03 NOTE — ED Notes (Signed)
Pt back from CT
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
## 2020-12-03 NOTE — ED Notes (Signed)
Pt to CT

## 2020-12-04 ENCOUNTER — Encounter: Payer: Self-pay | Admitting: Internal Medicine

## 2020-12-04 DIAGNOSIS — I10 Essential (primary) hypertension: Secondary | ICD-10-CM

## 2020-12-04 DIAGNOSIS — K862 Cyst of pancreas: Secondary | ICD-10-CM | POA: Diagnosis present

## 2020-12-04 DIAGNOSIS — E876 Hypokalemia: Secondary | ICD-10-CM | POA: Diagnosis present

## 2020-12-04 DIAGNOSIS — T502X5A Adverse effect of carbonic-anhydrase inhibitors, benzothiadiazides and other diuretics, initial encounter: Secondary | ICD-10-CM | POA: Diagnosis present

## 2020-12-04 DIAGNOSIS — K5731 Diverticulosis of large intestine without perforation or abscess with bleeding: Secondary | ICD-10-CM | POA: Diagnosis present

## 2020-12-04 DIAGNOSIS — E871 Hypo-osmolality and hyponatremia: Secondary | ICD-10-CM

## 2020-12-04 DIAGNOSIS — K922 Gastrointestinal hemorrhage, unspecified: Secondary | ICD-10-CM | POA: Diagnosis not present

## 2020-12-04 DIAGNOSIS — Z90710 Acquired absence of both cervix and uterus: Secondary | ICD-10-CM | POA: Diagnosis not present

## 2020-12-04 DIAGNOSIS — K641 Second degree hemorrhoids: Secondary | ICD-10-CM | POA: Diagnosis present

## 2020-12-04 DIAGNOSIS — H409 Unspecified glaucoma: Secondary | ICD-10-CM | POA: Diagnosis present

## 2020-12-04 DIAGNOSIS — K921 Melena: Secondary | ICD-10-CM | POA: Diagnosis not present

## 2020-12-04 DIAGNOSIS — M8580 Other specified disorders of bone density and structure, unspecified site: Secondary | ICD-10-CM | POA: Diagnosis present

## 2020-12-04 DIAGNOSIS — Z886 Allergy status to analgesic agent status: Secondary | ICD-10-CM | POA: Diagnosis not present

## 2020-12-04 DIAGNOSIS — Z841 Family history of disorders of kidney and ureter: Secondary | ICD-10-CM | POA: Diagnosis not present

## 2020-12-04 DIAGNOSIS — K5730 Diverticulosis of large intestine without perforation or abscess without bleeding: Secondary | ICD-10-CM | POA: Diagnosis not present

## 2020-12-04 DIAGNOSIS — K649 Unspecified hemorrhoids: Secondary | ICD-10-CM | POA: Diagnosis not present

## 2020-12-04 DIAGNOSIS — Z87442 Personal history of urinary calculi: Secondary | ICD-10-CM | POA: Diagnosis not present

## 2020-12-04 DIAGNOSIS — Z885 Allergy status to narcotic agent status: Secondary | ICD-10-CM | POA: Diagnosis not present

## 2020-12-04 DIAGNOSIS — K625 Hemorrhage of anus and rectum: Secondary | ICD-10-CM | POA: Diagnosis not present

## 2020-12-04 DIAGNOSIS — I700 Atherosclerosis of aorta: Secondary | ICD-10-CM | POA: Diagnosis present

## 2020-12-04 DIAGNOSIS — Z20822 Contact with and (suspected) exposure to covid-19: Secondary | ICD-10-CM | POA: Diagnosis present

## 2020-12-04 DIAGNOSIS — E7800 Pure hypercholesterolemia, unspecified: Secondary | ICD-10-CM | POA: Diagnosis present

## 2020-12-04 DIAGNOSIS — M069 Rheumatoid arthritis, unspecified: Secondary | ICD-10-CM

## 2020-12-04 DIAGNOSIS — Z79899 Other long term (current) drug therapy: Secondary | ICD-10-CM | POA: Diagnosis not present

## 2020-12-04 DIAGNOSIS — R42 Dizziness and giddiness: Secondary | ICD-10-CM | POA: Diagnosis present

## 2020-12-04 DIAGNOSIS — Z8041 Family history of malignant neoplasm of ovary: Secondary | ICD-10-CM | POA: Diagnosis not present

## 2020-12-04 DIAGNOSIS — Z833 Family history of diabetes mellitus: Secondary | ICD-10-CM | POA: Diagnosis not present

## 2020-12-04 DIAGNOSIS — Z8249 Family history of ischemic heart disease and other diseases of the circulatory system: Secondary | ICD-10-CM | POA: Diagnosis not present

## 2020-12-04 DIAGNOSIS — K219 Gastro-esophageal reflux disease without esophagitis: Secondary | ICD-10-CM | POA: Diagnosis present

## 2020-12-04 HISTORY — DX: Unspecified glaucoma: H40.9

## 2020-12-04 LAB — BASIC METABOLIC PANEL
Anion gap: 8 (ref 5–15)
BUN: 28 mg/dL — ABNORMAL HIGH (ref 8–23)
CO2: 27 mmol/L (ref 22–32)
Calcium: 9.1 mg/dL (ref 8.9–10.3)
Chloride: 100 mmol/L (ref 98–111)
Creatinine, Ser: 0.91 mg/dL (ref 0.44–1.00)
GFR, Estimated: >60 mL/min (ref >60)
Glucose, Bld: 98 mg/dL (ref 70–99)
Potassium: 3.2 mmol/L — ABNORMAL LOW (ref 3.5–5.1)
Sodium: 135 mmol/L (ref 135–145)

## 2020-12-04 LAB — HEMOGLOBIN AND HEMATOCRIT, BLOOD
HCT: 31.1 % — ABNORMAL LOW (ref 36.0–46.0)
Hemoglobin: 10.7 g/dL — ABNORMAL LOW (ref 12.0–15.0)

## 2020-12-04 MED ORDER — SODIUM CHLORIDE 0.9 % IV SOLN: INTRAVENOUS | Status: DC

## 2020-12-04 MED ORDER — PEG 3350-KCL-NA BICARB-NACL 420 G PO SOLR
4000.0000 mL | Freq: Once | ORAL | Status: AC
  Administered 2020-12-04: 4000 mL via ORAL
  Filled 2020-12-04: qty 4000

## 2020-12-04 MED ORDER — POTASSIUM CHLORIDE CRYS ER 20 MEQ PO TBCR
40.0000 meq | EXTENDED_RELEASE_TABLET | Freq: Once | ORAL | Status: AC
  Administered 2020-12-04: 40 meq via ORAL
  Filled 2020-12-04: qty 2
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
## 2020-12-05 ENCOUNTER — Encounter
Admission: EM | Disposition: A | Discharge status: Home / Self Care | Payer: Self-pay | Source: Home / Self Care | Attending: Internal Medicine

## 2020-12-05 ENCOUNTER — Inpatient Hospital Stay: Payer: Medicare Other | Admitting: Anesthesiology

## 2020-12-05 ENCOUNTER — Telehealth: Payer: Self-pay | Admitting: Internal Medicine

## 2020-12-05 ENCOUNTER — Encounter: Payer: Self-pay | Admitting: Internal Medicine

## 2020-12-05 DIAGNOSIS — E871 Hypo-osmolality and hyponatremia: Secondary | ICD-10-CM | POA: Diagnosis not present

## 2020-12-05 DIAGNOSIS — I10 Essential (primary) hypertension: Secondary | ICD-10-CM | POA: Diagnosis not present

## 2020-12-05 DIAGNOSIS — E876 Hypokalemia: Secondary | ICD-10-CM | POA: Diagnosis not present

## 2020-12-05 DIAGNOSIS — K922 Gastrointestinal hemorrhage, unspecified: Secondary | ICD-10-CM | POA: Diagnosis not present

## 2020-12-05 HISTORY — PX: COLONOSCOPY: SHX5424

## 2020-12-05 LAB — BASIC METABOLIC PANEL
Anion gap: 9 (ref 5–15)
BUN: 16 mg/dL (ref 8–23)
CO2: 24 mmol/L (ref 22–32)
Calcium: 8.9 mg/dL (ref 8.9–10.3)
Chloride: 102 mmol/L (ref 98–111)
Creatinine, Ser: 0.76 mg/dL (ref 0.44–1.00)
GFR, Estimated: >60 mL/min (ref >60)
Glucose, Bld: 92 mg/dL (ref 70–99)
Potassium: 3.5 mmol/L (ref 3.5–5.1)
Sodium: 135 mmol/L (ref 135–145)

## 2020-12-05 LAB — HM COLONOSCOPY

## 2020-12-05 LAB — HEMOGLOBIN: Hemoglobin: 11.6 g/dL — ABNORMAL LOW (ref 12.0–15.0)

## 2020-12-05 SURGERY — COLONOSCOPY
Anesthesia: General

## 2020-12-05 MED ORDER — PROPOFOL 500 MG/50ML IV EMUL
INTRAVENOUS | Status: DC | PRN
  Administered 2020-12-05: 100 ug/kg/min via INTRAVENOUS

## 2020-12-05 MED ORDER — SODIUM CHLORIDE 0.9 % IV SOLN: INTRAVENOUS | Status: DC

## 2020-12-05 MED ORDER — HYDROCORTISONE ACETATE 25 MG RE SUPP: 25.0000 mg | Freq: Two times a day (BID) | RECTAL | 0 refills | Status: AC | PRN

## 2020-12-05 MED ORDER — LIDOCAINE HCL (CARDIAC) PF 100 MG/5ML IV SOSY
PREFILLED_SYRINGE | INTRAVENOUS | Status: DC | PRN
  Administered 2020-12-05: 40 mg via INTRAVENOUS

## 2020-12-05 MED ORDER — POTASSIUM CHLORIDE CRYS ER 20 MEQ PO TBCR
20.0000 meq | EXTENDED_RELEASE_TABLET | Freq: Once | ORAL | Status: AC
  Administered 2020-12-05: 20 meq via ORAL
  Filled 2020-12-05: qty 1

## 2020-12-05 MED ORDER — PROPOFOL 10 MG/ML IV BOLUS
INTRAVENOUS | Status: DC | PRN
Start: 2020-12-05 — End: 2020-12-05
  Administered 2020-12-05: 30 mg via INTRAVENOUS
  Administered 2020-12-05 (×2): 10 mg via INTRAVENOUS

## 2020-12-05 NOTE — Anesthesia Preprocedure Evaluation (Signed)
Anesthesia Evaluation  Patient identified by MRN, date of birth, ID band Patient awake    Reviewed: Allergy & Precautions, H&P , NPO status , Patient's Chart, lab work & pertinent test results, reviewed documented beta blocker date and time   History of Anesthesia Complications (+) PONV and history of anesthetic complications  Airway Mallampati: II   Neck ROM: full    Dental  (+) Poor Dentition   Pulmonary neg pulmonary ROS,    Pulmonary exam normal        Cardiovascular Exercise Tolerance: Poor hypertension, On Medications negative cardio ROS Normal cardiovascular exam Rhythm:regular Rate:Normal     Neuro/Psych negative neurological ROS  negative psych ROS   GI/Hepatic Neg liver ROS, GERD  Medicated,  Endo/Other  negative endocrine ROS  Renal/GU Renal disease  negative genitourinary   Musculoskeletal   Abdominal   Peds  Hematology  (+) Blood dyscrasia, anemia ,   Anesthesia Other Findings Past Medical History: No date: Diverticulosis No date: Fibrocystic breast disease No date: GERD (gastroesophageal reflux disease) 12/04/2020: Glaucoma No date: History of kidney stones No date: Hypercholesterolemia No date: Hypertension No date: Hypertension No date: IBS (irritable bowel syndrome) No date: Inflammatory arthritis     Comment:  positive anti-CCP abs, s/p prednisone, MTX, Remicade No date: Nephrolithiasis No date: Osteopenia     Comment:  GI upset with Fosamax 2007: Pancreatitis     Comment:  s/p ERCP No date: Pancreatitis No date: PONV (postoperative nausea and vomiting) No date: Renal insufficiency No date: Spinal stenosis of lumbosacral region Past Surgical History: 1993: ABDOMINAL HYSTERECTOMY     Comment:  ovaries not removed 1984: BREAST BIOPSY; Left     Comment:  negative 2010: BREAST CYST EXCISION; Left     Comment:  negative No date: LITHOTRIPSY 1983: LUMBAR LAMINECTOMY 08/18/2016:  LUMBAR LAMINECTOMY/DECOMPRESSION MICRODISCECTOMY; N/A     Comment:  Procedure: LUMBAR LAMINECTOMY/DECOMPRESSION               MICRODISCECTOMY 1 LEVEL;  Surgeon: Bayard Hugger, MD;                Location: [facility] ORS;  Service: Neurosurgery;  Laterality:               N/A;  L4-5 Laminectomy, MIS, L5 foraminotomy No date: TRIGGER FINGER RELEASE     Comment:  right ring finger BMI    Body Mass Index: 19.99 kg/m     Reproductive/Obstetrics negative OB ROS                             Anesthesia Physical Anesthesia Plan  ASA: 3  Anesthesia Plan: General   Post-op Pain Management:    Induction:   PONV Risk Score and Plan:   Airway Management Planned:   Additional Equipment:   Intra-op Plan:   Post-operative Plan:   Informed Consent: I have reviewed the patients History and Physical, chart, labs and discussed the procedure including the risks, benefits and alternatives for the proposed anesthesia with the patient or authorized representative who has indicated his/her understanding and acceptance.     Dental Advisory Given  Plan Discussed with: CRNA  Anesthesia Plan Comments:         Anesthesia Quick Evaluation

## 2020-12-05 NOTE — Progress Notes (Signed)
Patient transported to Endo unit

## 2020-12-05 NOTE — Progress Notes (Signed)
Patient returned from her colonoscopy

## 2020-12-05 NOTE — Care Plan (Signed)
Colonoscopy with just diverticulosis and hemorrhoids.  Raylene Miyamoto MD, MPH [place]

## 2020-12-05 NOTE — Op Note (Signed)
[facility] Gastroenterology Patient Name: Stacey Snyder Procedure Date: 12/05/2020 1:39 PM MRN: [id number] Account #: [id number] Date of Birth: 11/22/1932 Admit Type: Inpatient Age: 87 Room: [facility] ENDO ROOM 1 Gender: Female Note Status: Finalized Procedure:             Colonoscopy Indications:           Hematochezia Providers:             Andrey Farmer MD, MD Referring MD:          Einar Pheasant, MD (Referring MD) Medicines:             Monitored Anesthesia Care Complications:         No immediate complications. Procedure:             Pre-Anesthesia Assessment:                        - Prior to the procedure, a History and Physical was                         performed, and patient medications and allergies were                         reviewed. The patient is competent. The risks and                         benefits of the procedure and the sedation options and                         risks were discussed with the patient. All questions                         were answered and informed consent was obtained.                         Patient identification and proposed procedure were                         verified by the physician, the nurse, the anesthetist                         and the technician in the endoscopy suite. Mental                         Status Examination: alert and oriented. Airway                         Examination: normal oropharyngeal airway and neck                         mobility. Respiratory Examination: clear to                         auscultation. CV Examination: normal. Prophylactic                         Antibiotics: The patient does not require prophylactic                         antibiotics.  Prior Anticoagulants: The patient has                         taken no previous anticoagulant or antiplatelet                         agents. ASA Grade Assessment: II - A patient with mild                         systemic disease.  After reviewing the risks and                         benefits, the patient was deemed in satisfactory                         condition to undergo the procedure. The anesthesia                         plan was to use monitored anesthesia care (MAC).                         Immediately prior to administration of medications,                         the patient was re-assessed for adequacy to receive                         sedatives. The heart rate, respiratory rate, oxygen                         saturations, blood pressure, adequacy of pulmonary                         ventilation, and response to care were monitored                         throughout the procedure. The physical status of the                         patient was re-assessed after the procedure.                        After obtaining informed consent, the colonoscope was                         passed under direct vision. Throughout the procedure,                         the patient's blood pressure, pulse, and oxygen                         saturations were monitored continuously. The                         Colonoscope was introduced through the anus and                         advanced to the the terminal ileum. The colonoscopy  was somewhat difficult due to restricted mobility of                         the colon. The patient tolerated the procedure well.                         The quality of the bowel preparation was good. Findings:      The perianal and digital rectal examinations were normal.      The terminal ileum appeared normal.      Multiple small and large-mouthed diverticula were found in the sigmoid       colon, descending colon, hepatic flexure and ascending colon.      Internal hemorrhoids were found during retroflexion. The hemorrhoids       were Grade II (internal hemorrhoids that prolapse but reduce       spontaneously).      The exam was otherwise without abnormality on direct  and retroflexion       views. Impression:            - The examined portion of the ileum was normal.                        - Diverticulosis in the sigmoid colon, in the                         descending colon, at the hepatic flexure and in the                         ascending colon.                        - Internal hemorrhoids.                        - The examination was otherwise normal on direct and                         retroflexion views.                        - No specimens collected. Recommendation:        - Return patient to hospital ward for possible                         discharge same day.                        - Resume previous diet.                        - Continue present medications.                        - Repeat colonoscopy is not recommended due to current                         age (80 years or older) for screening purposes.                        - Return to referring physician as previously  scheduled. Procedure Code(s):     --- Professional ---                        [phone], Colonoscopy, flexible; diagnostic, including                         collection of specimen(s) by brushing or washing, when                         performed (separate procedure) Diagnosis Code(s):     --- Professional ---                        K64.1, Second degree hemorrhoids                        K92.1, Melena (includes Hematochezia)                        K57.30, Diverticulosis of large intestine without                         perforation or abscess without bleeding CPT copyright 2019 [employer]. All rights reserved. The codes documented in this report are preliminary and upon coder review may  be revised to meet current compliance requirements. Andrey Farmer MD, MD 12/05/2020 2:21:03 PM Number of Addenda: 0 Note Initiated On: 12/05/2020 1:39 PM Scope Withdrawal Time: 0 hours 8 minutes 9 seconds  Total Procedure Duration: 0 hours 17  minutes 8 seconds  Estimated Blood Loss:  Estimated blood loss: none.      [facility]

## 2020-12-05 NOTE — Telephone Encounter (Signed)
Real called to schedule a HFU appt for one week. No appts available
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
## 2020-12-05 NOTE — Transfer of Care (Signed)
Immediate Anesthesia Transfer of Care Note  Patient: Stacey Snyder  Procedure(s) Performed: COLONOSCOPY  Patient Location: PACU and Endoscopy Unit  Anesthesia Type:General  Level of Consciousness: awake  Airway & Oxygen Therapy: Patient Spontanous Breathing  Post-op Assessment: Report given to RN and Post -op Vital signs reviewed and stable  Post vital signs: Reviewed and stable  Last Vitals:  Vitals Value Taken Time  BP    Temp    Pulse    Resp    SpO2      Last Pain:  Vitals:   12/05/20 1323  TempSrc:   PainSc: 0-No pain         Complications: No notable events documented.

## 2020-12-06 ENCOUNTER — Encounter: Payer: Self-pay | Admitting: Gastroenterology

## 2020-12-06 ENCOUNTER — Telehealth: Payer: Self-pay

## 2020-12-06 NOTE — Telephone Encounter (Signed)
Transition Care Management Follow-up Telephone Call  No TCM appointment scheduled at this time.  Date of discharge and from where: 12/05/20 from [facility]. How have you been since you were released from the hospital? I am doing much better. Denies bleeding, fever, pain, n/v/d since returning home. Using suppository.  Any questions or concerns? No  Items Reviewed: Did the pt receive and understand the discharge instructions provided? Yes  Medications obtained and verified? Yes  Any new allergies since your discharge? No  Dietary orders reviewed? Yes, regular diet Do you have support at home? Yes, daughter   [place] and Equipment/Supplies: Were home health services ordered? no  Functional Questionnaire: (I = Independent and D = Dependent) ADLs: I  Maintaining continence- I  Transferring/Ambulation- I  Managing Meds- I  Follow up appointments reviewed:  PCP Hospital f/u appt confirmed?  Awaiting appointment. Patient requests Thursday or Wednesday afternoon. appointment if possible due transportation.  Are transportation arrangements needed? No . Family assist next week.  If their condition worsens, is the pt aware to call PCP or go to the Emergency Dept.? Yes Was the patient provided with contact information for the PCP's office or ED? Yes Was to pt encouraged to call back with questions or concerns? Yes

## 2020-12-06 NOTE — Anesthesia Postprocedure Evaluation (Signed)
Anesthesia Post Note  Patient: Stacey Snyder  Procedure(s) Performed: COLONOSCOPY  Patient location during evaluation: PACU Anesthesia Type: General Level of consciousness: awake and alert Pain management: pain level controlled Vital Signs Assessment: post-procedure vital signs reviewed and stable Respiratory status: spontaneous breathing, nonlabored ventilation, respiratory function stable and patient connected to nasal cannula oxygen Cardiovascular status: blood pressure returned to baseline and stable Postop Assessment: no apparent nausea or vomiting Anesthetic complications: no   No notable events documented.   Last Vitals:  Vitals:   12/05/20 1450 12/05/20 1513  BP: (!) 155/50 (!) 164/49  Pulse: (!) 53 60  Resp: 14   Temp:  36.8 C  SpO2: 100% 100%    Last Pain:  Vitals:   12/05/20 1513  TempSrc: Oral  PainSc:                  [name]

## 2020-12-06 NOTE — Telephone Encounter (Signed)
See other result note regarding hospital follow up 12/19/20 - 12:30 - see if she can come to appt.

## 2020-12-06 NOTE — Telephone Encounter (Signed)
See if she can come 12/19/20 - 12:30.  I am not her Wednesday afternoon and I am off work Thursday 12/13/20.  If any problems, let me know.

## 2020-12-10 NOTE — Telephone Encounter (Signed)
Patient has been screened and scheduled for HFU 12/19/20. Confirmed patient is doing ok since discharge

## 2020-12-17 DIAGNOSIS — M05712 Rheumatoid arthritis with rheumatoid factor of left shoulder without organ or systems involvement: Secondary | ICD-10-CM | POA: Diagnosis not present

## 2020-12-19 ENCOUNTER — Ambulatory Visit (INDEPENDENT_AMBULATORY_CARE_PROVIDER_SITE_OTHER): Payer: Medicare Other

## 2020-12-19 ENCOUNTER — Other Ambulatory Visit: Payer: Self-pay

## 2020-12-19 ENCOUNTER — Ambulatory Visit (INDEPENDENT_AMBULATORY_CARE_PROVIDER_SITE_OTHER): Payer: Medicare Other | Admitting: Internal Medicine

## 2020-12-19 VITALS — BP 158/70 | HR 65 | Temp 96.5°F | Resp 16 | Ht 61.0 in | Wt 107.2 lb

## 2020-12-19 DIAGNOSIS — M25512 Pain in left shoulder: Secondary | ICD-10-CM

## 2020-12-19 DIAGNOSIS — I10 Essential (primary) hypertension: Secondary | ICD-10-CM

## 2020-12-19 DIAGNOSIS — K219 Gastro-esophageal reflux disease without esophagitis: Secondary | ICD-10-CM | POA: Diagnosis not present

## 2020-12-19 DIAGNOSIS — E876 Hypokalemia: Secondary | ICD-10-CM

## 2020-12-19 DIAGNOSIS — I700 Atherosclerosis of aorta: Secondary | ICD-10-CM | POA: Diagnosis not present

## 2020-12-19 DIAGNOSIS — M069 Rheumatoid arthritis, unspecified: Secondary | ICD-10-CM | POA: Diagnosis not present

## 2020-12-19 DIAGNOSIS — E871 Hypo-osmolality and hyponatremia: Secondary | ICD-10-CM | POA: Diagnosis not present

## 2020-12-19 DIAGNOSIS — D649 Anemia, unspecified: Secondary | ICD-10-CM | POA: Diagnosis not present

## 2020-12-19 DIAGNOSIS — K922 Gastrointestinal hemorrhage, unspecified: Secondary | ICD-10-CM

## 2020-12-19 DIAGNOSIS — K862 Cyst of pancreas: Secondary | ICD-10-CM

## 2020-12-19 DIAGNOSIS — E7800 Pure hypercholesterolemia, unspecified: Secondary | ICD-10-CM | POA: Diagnosis not present

## 2020-12-19 DIAGNOSIS — M19012 Primary osteoarthritis, left shoulder: Secondary | ICD-10-CM | POA: Diagnosis not present

## 2020-12-19 LAB — BASIC METABOLIC PANEL
BUN: 22 mg/dL (ref 6–23)
CO2: 29 mEq/L (ref 19–32)
Calcium: 9.6 mg/dL (ref 8.4–10.5)
Chloride: 100 mEq/L (ref 96–112)
Creatinine, Ser: 0.99 mg/dL (ref 0.40–1.20)
GFR: 51.93 mL/min — ABNORMAL LOW (ref >60.00)
Glucose, Bld: 86 mg/dL (ref 70–99)
Potassium: 3.7 mEq/L (ref 3.5–5.1)
Sodium: 137 mEq/L (ref 135–145)

## 2020-12-19 LAB — CBC WITH DIFFERENTIAL/PLATELET
Basophils Absolute: 0.1 10*3/uL (ref 0.0–0.1)
Basophils Relative: 0.8 % (ref 0.0–3.0)
Eosinophils Absolute: 0.2 10*3/uL (ref 0.0–0.7)
Eosinophils Relative: 2.9 % (ref 0.0–5.0)
HCT: 34.9 % — ABNORMAL LOW (ref 36.0–46.0)
Hemoglobin: 11.6 g/dL — ABNORMAL LOW (ref 12.0–15.0)
Lymphocytes Relative: 47.7 % — ABNORMAL HIGH (ref 12.0–46.0)
Lymphs Abs: 3.3 10*3/uL (ref 0.7–4.0)
MCHC: 33.3 g/dL (ref 30.0–36.0)
MCV: 96.9 fl (ref 78.0–100.0)
Monocytes Absolute: 0.8 10*3/uL (ref 0.1–1.0)
Monocytes Relative: 11.4 % (ref 3.0–12.0)
Neutro Abs: 2.5 10*3/uL (ref 1.4–7.7)
Neutrophils Relative %: 37.2 % — ABNORMAL LOW (ref 43.0–77.0)
Platelets: 263 10*3/uL (ref 150.0–400.0)
RBC: 3.6 Mil/uL — ABNORMAL LOW (ref 3.87–5.11)
RDW: 14.9 % (ref 11.5–15.5)
WBC: 6.8 10*3/uL (ref 4.0–10.5)

## 2020-12-19 LAB — IBC + FERRITIN
Ferritin: 137.3 ng/mL (ref 10.0–291.0)
Iron: 87 ug/dL (ref 42–145)
Saturation Ratios: 28.9 % (ref 20.0–50.0)
TIBC: 301 ug/dL (ref 250.0–450.0)
Transferrin: 215 mg/dL (ref 212.0–360.0)

## 2020-12-19 MED ORDER — HYDRALAZINE HCL 10 MG PO TABS: 10.0000 mg | ORAL_TABLET | Freq: Three times a day (TID) | ORAL | 2 refills | Status: DC
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
## 2020-12-22 ENCOUNTER — Other Ambulatory Visit: Payer: Self-pay | Admitting: Internal Medicine

## 2020-12-22 DIAGNOSIS — M25512 Pain in left shoulder: Secondary | ICD-10-CM

## 2020-12-22 DIAGNOSIS — E785 Hyperlipidemia, unspecified: Secondary | ICD-10-CM

## 2020-12-22 NOTE — Progress Notes (Signed)
Order placed for referral to ortho - Dr Roland Rack

## 2020-12-24 ENCOUNTER — Encounter: Payer: Self-pay | Admitting: Internal Medicine
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
## 2020-12-24 NOTE — Assessment & Plan Note (Signed)
Recent GI bleed.  Hospitalized.  CBC decreased to 10.7.  No further bleeding since discharge.  Recheck CBC today.

## 2020-12-24 NOTE — Assessment & Plan Note (Signed)
Continue lovastatin.  Follow lipid panel and liver function tests.   

## 2020-12-24 NOTE — Assessment & Plan Note (Signed)
Would like for her to remain off Celebrex.  Followed by rheumatology.  Has been receiving Remicade.

## 2020-12-24 NOTE — Assessment & Plan Note (Signed)
Potassium replaced in the hospital.  Off triamterene/HCTZ.  Recheck potassium today to confirm normal.

## 2020-12-24 NOTE — Assessment & Plan Note (Signed)
Just admitted with lower GI bleed.  Colonoscopy as outlined.  No active bleeding noted on exam.  Has had no further bleeding.  Recheck CBC.

## 2020-12-24 NOTE — Assessment & Plan Note (Signed)
No upper symptoms reported.  Continue Prilosec. 

## 2020-12-24 NOTE — Assessment & Plan Note (Signed)
Continue lovastatin 

## 2020-12-24 NOTE — Assessment & Plan Note (Signed)
Currently on losartan.  Off triamterene/HCTZ.  Given persistent intermittent issues with low sodium we will leave her off of the triamterene/ HCTZ.  Continue losartan.  Start hydralazine 10 mg-  3 times daily.  Follow pressures.  Recheck metabolic panel.
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
## 2020-12-24 NOTE — Assessment & Plan Note (Signed)
Low sodium noted in the hospital.  Off triamterene/HCTZ.  Recheck sodium level today.

## 2021-01-09 ENCOUNTER — Encounter: Payer: Self-pay | Admitting: Internal Medicine

## 2021-01-22 ENCOUNTER — Telehealth: Payer: Self-pay | Admitting: Internal Medicine

## 2021-01-22 NOTE — Telephone Encounter (Signed)
Rejection Reason - Patient Declined" Stacey Snyder said on Jan 22, 2021 3:37 PM  Msg from [facility] orthopedic surgery

## 2021-01-27 ENCOUNTER — Other Ambulatory Visit: Payer: Self-pay | Admitting: Internal Medicine

## 2021-01-27 DIAGNOSIS — K219 Gastro-esophageal reflux disease without esophagitis: Secondary | ICD-10-CM

## 2021-01-28 DIAGNOSIS — M05712 Rheumatoid arthritis with rheumatoid factor of left shoulder without organ or systems involvement: Secondary | ICD-10-CM | POA: Diagnosis not present

## 2021-03-12 ENCOUNTER — Ambulatory Visit: Payer: Medicare Other | Admitting: Internal Medicine

## 2021-03-14 ENCOUNTER — Ambulatory Visit (INDEPENDENT_AMBULATORY_CARE_PROVIDER_SITE_OTHER): Payer: Medicare Other | Admitting: Internal Medicine

## 2021-03-14 ENCOUNTER — Other Ambulatory Visit: Payer: Self-pay

## 2021-03-14 ENCOUNTER — Encounter: Payer: Self-pay | Admitting: Internal Medicine

## 2021-03-14 VITALS — BP 122/70 | HR 65 | Temp 97.6°F | Resp 16 | Ht 61.0 in | Wt 107.0 lb

## 2021-03-14 DIAGNOSIS — D649 Anemia, unspecified: Secondary | ICD-10-CM

## 2021-03-14 DIAGNOSIS — I10 Essential (primary) hypertension: Secondary | ICD-10-CM

## 2021-03-14 DIAGNOSIS — F439 Reaction to severe stress, unspecified: Secondary | ICD-10-CM | POA: Diagnosis not present

## 2021-03-14 DIAGNOSIS — Z23 Encounter for immunization: Secondary | ICD-10-CM

## 2021-03-14 DIAGNOSIS — K219 Gastro-esophageal reflux disease without esophagitis: Secondary | ICD-10-CM | POA: Diagnosis not present

## 2021-03-14 DIAGNOSIS — R634 Abnormal weight loss: Secondary | ICD-10-CM | POA: Diagnosis not present

## 2021-03-14 DIAGNOSIS — E7800 Pure hypercholesterolemia, unspecified: Secondary | ICD-10-CM | POA: Diagnosis not present

## 2021-03-14 DIAGNOSIS — I700 Atherosclerosis of aorta: Secondary | ICD-10-CM | POA: Diagnosis not present

## 2021-03-14 DIAGNOSIS — M25512 Pain in left shoulder: Secondary | ICD-10-CM | POA: Diagnosis not present

## 2021-03-14 DIAGNOSIS — D472 Monoclonal gammopathy: Secondary | ICD-10-CM | POA: Diagnosis not present

## 2021-03-14 DIAGNOSIS — M069 Rheumatoid arthritis, unspecified: Secondary | ICD-10-CM

## 2021-03-14 LAB — CBC WITH DIFFERENTIAL/PLATELET
Basophils Absolute: 0 10*3/uL (ref 0.0–0.1)
Basophils Relative: 0.6 % (ref 0.0–3.0)
Eosinophils Absolute: 0.2 10*3/uL (ref 0.0–0.7)
Eosinophils Relative: 2.9 % (ref 0.0–5.0)
HCT: 38.5 % (ref 36.0–46.0)
Hemoglobin: 12.5 g/dL (ref 12.0–15.0)
Lymphocytes Relative: 42 % (ref 12.0–46.0)
Lymphs Abs: 2.6 10*3/uL (ref 0.7–4.0)
MCHC: 32.5 g/dL (ref 30.0–36.0)
MCV: 93.3 fl (ref 78.0–100.0)
Monocytes Absolute: 0.7 10*3/uL (ref 0.1–1.0)
Monocytes Relative: 11.4 % (ref 3.0–12.0)
Neutro Abs: 2.6 10*3/uL (ref 1.4–7.7)
Neutrophils Relative %: 43.1 % (ref 43.0–77.0)
Platelets: 253 10*3/uL (ref 150.0–400.0)
RBC: 4.13 Mil/uL (ref 3.87–5.11)
RDW: 13.9 % (ref 11.5–15.5)
WBC: 6.1 10*3/uL (ref 4.0–10.5)

## 2021-03-14 LAB — LIPID PANEL
Cholesterol: 182 mg/dL (ref 0–200)
HDL: 72.9 mg/dL (ref >39.00)
LDL Cholesterol: 89 mg/dL (ref 0–99)
NonHDL: 109.22
Total CHOL/HDL Ratio: 2
Triglycerides: 102 mg/dL (ref 0.0–149.0)
VLDL: 20.4 mg/dL (ref 0.0–40.0)

## 2021-03-14 LAB — BASIC METABOLIC PANEL
BUN: 24 mg/dL — ABNORMAL HIGH (ref 6–23)
CO2: 31 mEq/L (ref 19–32)
Calcium: 9.7 mg/dL (ref 8.4–10.5)
Chloride: 99 mEq/L (ref 96–112)
Creatinine, Ser: 0.86 mg/dL (ref 0.40–1.20)
GFR: 61.38 mL/min (ref >60.00)
Glucose, Bld: 92 mg/dL (ref 70–99)
Potassium: 3.4 mEq/L — ABNORMAL LOW (ref 3.5–5.1)
Sodium: 137 mEq/L (ref 135–145)

## 2021-03-14 LAB — HEPATIC FUNCTION PANEL
ALT: 12 U/L (ref 0–35)
AST: 22 U/L (ref 0–37)
Albumin: 4.3 g/dL (ref 3.5–5.2)
Alkaline Phosphatase: 75 U/L (ref 39–117)
Bilirubin, Direct: 0.1 mg/dL (ref 0.0–0.3)
Total Bilirubin: 0.5 mg/dL (ref 0.2–1.2)
Total Protein: 7.1 g/dL (ref 6.0–8.3)

## 2021-03-14 LAB — IBC + FERRITIN
Ferritin: 96.6 ng/mL (ref 10.0–291.0)
Iron: 88 ug/dL (ref 42–145)
Saturation Ratios: 28.7 % (ref 20.0–50.0)
TIBC: 306.6 ug/dL (ref 250.0–450.0)
Transferrin: 219 mg/dL (ref 212.0–360.0)
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
## 2021-03-15 ENCOUNTER — Other Ambulatory Visit: Payer: Self-pay

## 2021-03-15 DIAGNOSIS — E876 Hypokalemia: Secondary | ICD-10-CM

## 2021-03-17 ENCOUNTER — Other Ambulatory Visit: Payer: Self-pay

## 2021-03-17 DIAGNOSIS — E876 Hypokalemia: Secondary | ICD-10-CM | POA: Insufficient documentation

## 2021-03-17 DIAGNOSIS — I10 Essential (primary) hypertension: Secondary | ICD-10-CM | POA: Diagnosis not present

## 2021-03-17 DIAGNOSIS — R001 Bradycardia, unspecified: Secondary | ICD-10-CM | POA: Insufficient documentation

## 2021-03-17 DIAGNOSIS — R0902 Hypoxemia: Secondary | ICD-10-CM | POA: Diagnosis not present

## 2021-03-17 DIAGNOSIS — Z79899 Other long term (current) drug therapy: Secondary | ICD-10-CM | POA: Insufficient documentation

## 2021-03-17 DIAGNOSIS — R000 Tachycardia, unspecified: Secondary | ICD-10-CM | POA: Diagnosis not present

## 2021-03-17 LAB — BASIC METABOLIC PANEL
Anion gap: 8 (ref 5–15)
BUN: 24 mg/dL — ABNORMAL HIGH (ref 8–23)
CO2: 25 mmol/L (ref 22–32)
Calcium: 9.5 mg/dL (ref 8.9–10.3)
Chloride: 101 mmol/L (ref 98–111)
Creatinine, Ser: 0.88 mg/dL (ref 0.44–1.00)
GFR, Estimated: >60 mL/min (ref >60)
Glucose, Bld: 120 mg/dL — ABNORMAL HIGH (ref 70–99)
Potassium: 3.2 mmol/L — ABNORMAL LOW (ref 3.5–5.1)
Sodium: 134 mmol/L — ABNORMAL LOW (ref 135–145)

## 2021-03-17 LAB — CBC WITH DIFFERENTIAL/PLATELET
Abs Immature Granulocytes: 0.01 10*3/uL (ref 0.00–0.07)
Basophils Absolute: 0.1 10*3/uL (ref 0.0–0.1)
Basophils Relative: 1 %
Eosinophils Absolute: 0.3 10*3/uL (ref 0.0–0.5)
Eosinophils Relative: 3 %
HCT: 39.4 % (ref 36.0–46.0)
Hemoglobin: 13.1 g/dL (ref 12.0–15.0)
Immature Granulocytes: 0 %
Lymphocytes Relative: 35 %
Lymphs Abs: 2.8 10*3/uL (ref 0.7–4.0)
MCH: 30.8 pg (ref 26.0–34.0)
MCHC: 33.2 g/dL (ref 30.0–36.0)
MCV: 92.7 fL (ref 80.0–100.0)
Monocytes Absolute: 0.8 10*3/uL (ref 0.1–1.0)
Monocytes Relative: 10 %
Neutro Abs: 3.9 10*3/uL (ref 1.7–7.7)
Neutrophils Relative %: 51 %
Platelets: 248 10*3/uL (ref 150–400)
RBC: 4.25 MIL/uL (ref 3.87–5.11)
RDW: 13.4 % (ref 11.5–15.5)
WBC: 7.9 10*3/uL (ref 4.0–10.5)
nRBC: 0 % (ref 0.0–0.2)

## 2021-03-17 NOTE — ED Triage Notes (Addendum)
Pt from home via EMS, states she took BP meds as prescribed- pt unsure what she takes, pt c/o headache. Pt denies N/V/D, CP, dizziness, SOB. Pt AOX4, NAD noted.

## 2021-03-17 NOTE — ED Notes (Signed)
Patient to waiting room in recliner by EMS from home.  Per EMS patient took blood pressure at home and it was 197/68.  EMS took bo and it was 240/92 and patient complained of headache.  Just prior to arrival -- hr 66, bp 210/76, pulse oxi 99% on room air.

## 2021-03-18 ENCOUNTER — Telehealth: Payer: Self-pay | Admitting: Internal Medicine

## 2021-03-18 ENCOUNTER — Emergency Department
Admission: EM | Admit: 2021-03-18 | Discharge: 2021-03-18 | Disposition: A | Discharge status: Home / Self Care | Payer: Medicare Other | Attending: Emergency Medicine | Admitting: Emergency Medicine

## 2021-03-18 DIAGNOSIS — E876 Hypokalemia: Secondary | ICD-10-CM

## 2021-03-18 DIAGNOSIS — I10 Essential (primary) hypertension: Secondary | ICD-10-CM

## 2021-03-18 LAB — MULTIPLE MYELOMA PANEL, SERUM
Albumin SerPl Elph-Mcnc: 3.9 g/dL (ref 2.9–4.4)
Albumin/Glob SerPl: 1.3 (ref 0.7–1.7)
Alpha 1: 0.2 g/dL (ref 0.0–0.4)
Alpha2 Glob SerPl Elph-Mcnc: 0.7 g/dL (ref 0.4–1.0)
B-Globulin SerPl Elph-Mcnc: 0.9 g/dL (ref 0.7–1.3)
Gamma Glob SerPl Elph-Mcnc: 1.4 g/dL (ref 0.4–1.8)
Globulin, Total: 3.2 g/dL (ref 2.2–3.9)
IgA/Immunoglobulin A, Serum: 302 mg/dL (ref 64–422)
IgG (Immunoglobin G), Serum: 1183 mg/dL (ref 586–1602)
IgM (Immunoglobulin M), Srm: 229 mg/dL — ABNORMAL HIGH (ref 26–217)
Total Protein: 7.1 g/dL (ref 6.0–8.5)

## 2021-03-18 MED ORDER — POTASSIUM CHLORIDE CRYS ER 20 MEQ PO TBCR
40.0000 meq | EXTENDED_RELEASE_TABLET | Freq: Once | ORAL | Status: AC
  Administered 2021-03-18: 40 meq via ORAL
  Filled 2021-03-18: qty 2

## 2021-03-18 NOTE — Assessment & Plan Note (Signed)
Currently on losartan.  Off triamterene/HCTZ.  On hydralazine 10 mg-  3 times daily.  Pressures are better.  Follow pressures.  Recheck metabolic panel. She is feeling better.

## 2021-03-18 NOTE — Discharge Instructions (Addendum)

## 2021-03-18 NOTE — Telephone Encounter (Signed)
Patient went to ED via EMS BP at ED was 184/63 pulse 66 , Per patient BP was not rechecked at ED. I have pasted lab results and ECG interp to note from ED. Patient complaint today Her BP is 206/94 pulse 99 has not slept up in ED all night, was given potassium 40 milli equivalent by mouth and released. Patient still has same complaint of head fulness and does not feel well at 9 am patient took Hydralazine 10 mg , losartan 100 mg and Dilt- XR 240 mg , and asking should she take more BP  medication , advised hold til could speak with PCP.ED advised patient call PCP for BP medication adjustment.     Labs Reviewed  BASIC METABOLIC PANEL - Abnormal; Notable for the following components:     Result Value    Sodium 134 (*)     Potassium 3.2 (*)     Glucose, Bld 120 (*)     BUN 24 (*)     All other components within normal limits CBC WITH DIFFERENTIAL/PLATELET   ____________________________________________   EKG   ED ECG REPORT I, Hinda Kehr, the attending physician, personally viewed and interpreted this ECG.   Date: 03/17/2021 EKG Time: 22: 07 Rate: 57 Rhythm: Sinus bradycardia QRS Axis: normal Intervals: normal ST/T Wave abnormalities: Non-specific ST segment / T-wave changes, but no clear evidence of acute ischemia. Narrative Interpretation: no definitive evidence of acute ischemia; does not meet STEMI criteria.

## 2021-03-18 NOTE — Telephone Encounter (Signed)
Called and spoke to pt.  She slept and has been reading.  Is feeling better.  Head is feeling better.  She has not rechecked her blood pressure.  Will increase hydralazine to 10mg  (2 tablets tid) starting today.  She will recheck her blood pressure this pm.  Offered to see her today.  She prefers to hold on coming back in for evaluation.

## 2021-03-18 NOTE — Telephone Encounter (Signed)
Patient called  in her BP readings per Dr Nicki Reaper; 03/17/21 around  12pm bp was 164/64, pulse 70. 1:15 it was 155/90, pulse 64, 1:40pm 192/70, pulse 77, 4pm dropped then went to 197/80 at 7pm. Patient called 911, ENT's checked BP, it was 240/80 at hospital. At hospital it 220/80. Patient would like to take 2 pills this morning.

## 2021-03-18 NOTE — Assessment & Plan Note (Signed)
Continue lovastatin 

## 2021-03-18 NOTE — Assessment & Plan Note (Signed)
Would like for her to remain off Celebrex.  Followed by rheumatology.  Has been receiving Remicade.

## 2021-03-18 NOTE — Assessment & Plan Note (Signed)
Discussed increased stress.  She has good support.  Does not feel needs any further intervention.  Follow.

## 2021-03-18 NOTE — Telephone Encounter (Signed)
Please call and see how her blood pressure is doing.  Thanks

## 2021-03-18 NOTE — Assessment & Plan Note (Signed)
No upper symptoms reported.  Continue Prilosec. 

## 2021-03-18 NOTE — Assessment & Plan Note (Signed)
Continue lovastatin.  Follow lipid panel and liver function tests.   

## 2021-03-18 NOTE — Assessment & Plan Note (Signed)
Weight stable from last check.  Follow.

## 2021-03-18 NOTE — Telephone Encounter (Signed)
Patient  stated " I feel better than this morning . My BP is down some" ask patient to take BP  " 164/70 pulse 76" at pm took BP medication around 2 per patient the Hydralazine 10 mg.  Patient denies head fullness or dizziness at this time.

## 2021-03-18 NOTE — Assessment & Plan Note (Signed)
Discussed need for follow-up with hematology.  See previous phone conversation.  She declines at this time.  Notify me when agreeable. Check SIEP

## 2021-03-18 NOTE — ED Provider Notes (Signed)
[facility] Emergency Department Provider Note  ____________________________________________   Event Date/Time   First MD Initiated Contact with Patient 03/18/21 [phone]     (approximate)  I have reviewed the triage vital signs and the nursing notes.   HISTORY  Chief Complaint Hypertension    HPI Stacey Snyder is a 87 y.o. female with medical history as listed below who presents for evaluation of high blood pressure and a headache.  She said that she has been having trouble for a couple of weeks with her blood pressure.  She stated will go up and then will go back down.  Her medication was recently changed around.  She sees Dr. Nicki Reaper regularly for primary care and Dr. Nicki Reaper just adjusted her blood pressure medicine about 4 days ago.  She said that her head has felt full, not exactly hurting, but not quite right, over the last week and she feels that it correlates with her blood pressure.  Tonight her head was feeling full and she checked her blood pressure and it was elevated.  She said that every time she rechecked it got higher and higher and when it got to 562 systolic she decided she should be evaluated.  She said that her head still does not feel exactly right but she feels better in general.  She denies visual changes, nausea, vomiting, abdominal pain, chest pain, and any urinary symptoms.  Nothing in particular makes the symptoms better or worse and her blood pressure does become severe.     Past Medical History:  Diagnosis Date   Diverticulosis    Fibrocystic breast disease    GERD (gastroesophageal reflux disease)    Glaucoma 12/04/2020   History of kidney stones    Hypercholesterolemia    Hypertension    Hypertension    IBS (irritable bowel syndrome)    Inflammatory arthritis    positive anti-CCP abs, s/p prednisone, MTX, Remicade   Nephrolithiasis    Osteopenia    GI upset with Fosamax   Pancreatitis 2007   s/p ERCP    Pancreatitis    PONV (postoperative nausea and vomiting)    Renal insufficiency    Spinal stenosis of lumbosacral region     Patient Active Problem List   Diagnosis Date Noted   Glaucoma 12/04/2020   Lower GI bleed 12/03/2020   Hypokalemia 12/03/2020   Pancreas cyst 09/12/2020   Aortic atherosclerosis ([place]) 07/30/2020   Diarrhea 07/10/2020   Right shoulder pain 02/19/2019   Rash 02/19/2019   MGUS (monoclonal gammopathy of unknown significance) 01/19/2018   Cramps, extremity 12/02/2017   Abdominal bruit 01/16/2017   Lumbar stenosis 08/18/2016   Abdominal pain 12/03/2015   Abnormal liver function tests 10/18/2015   Anemia 10/18/2015   Hyponatremia 10/07/2015   Lip lesion 04/23/2015   Scalp lesion 04/23/2015   Loss of weight 04/23/2015   Stress 04/23/2015   Left shoulder pain 12/20/2014   UTI (urinary tract infection) 08/20/2014   Health care maintenance 08/20/2014   Skin lesion 04/17/2014   Fatigue 04/13/2014   Shoulder pain, right 12/17/2013   Trigger finger 08/21/2013   Left hip pain 04/17/2013   GERD (gastroesophageal reflux disease) 04/03/2012   Rheumatoid arthritis ([place]) 03/30/2012   Diverticulosis 03/30/2012   Osteopenia 03/30/2012   Essential hypertension 03/30/2012   Hypercholesterolemia 03/30/2012    Past Surgical History:  Procedure Laterality Date   ABDOMINAL HYSTERECTOMY  1993   ovaries not removed   BREAST BIOPSY Left 1984   negative  BREAST CYST EXCISION Left 2010   negative   COLONOSCOPY N/A 12/05/2020   Procedure: COLONOSCOPY;  Surgeon: Lesly Rubenstein, MD;  Location: [facility] ENDOSCOPY;  Service: Endoscopy;  Laterality: N/A;   LITHOTRIPSY     LUMBAR LAMINECTOMY  1983   LUMBAR LAMINECTOMY/DECOMPRESSION MICRODISCECTOMY N/A 08/18/2016   Procedure: LUMBAR LAMINECTOMY/DECOMPRESSION MICRODISCECTOMY 1 LEVEL;  Surgeon: Bayard Hugger, MD;  Location: [facility] ORS;  Service: Neurosurgery;  Laterality: N/A;  L4-5 Laminectomy, MIS, L5 foraminotomy   TRIGGER FINGER  RELEASE     right ring finger    Prior to Admission medications   Medication Sig Start Date End Date Taking? Authorizing Provider  celecoxib (CELEBREX) 100 MG capsule Take 100 mg by mouth 2 (two) times daily. 12/13/20   [provider]  Cholecalciferol (VITAMIN D) 2000 UNITS tablet Take 2,000 Units by mouth daily.    [provider]  cyanocobalamin 1000 MCG tablet Take 1,000 mcg by mouth daily.    [provider]  DILT-XR 240 MG 24 hr capsule TAKE 1 CAPSULE DAILY 12/24/20   Einar Pheasant, MD  dorzolamide-timolol (COSOPT) 22.3-6.8 MG/ML ophthalmic solution INSTILL 1 DROP INTO EACH EYE TWICE DAILY 12/17/17   [provider]  fluticasone (FLONASE) 50 MCG/ACT nasal spray USE TWO SPRAY(S) IN EACH NOSTRIL ONCE DAILY 01/14/17   Einar Pheasant, MD  gabapentin (NEURONTIN) 300 MG capsule TAKE 1 CAPSULE AT BEDTIME 12/24/20   Einar Pheasant, MD  hydrALAZINE (APRESOLINE) 10 MG tablet Take 1 tablet (10 mg total) by mouth 3 (three) times daily. 12/19/20   Einar Pheasant, MD  InFLIXimab (REMICADE IV) Inject into the vein every 6 (six) weeks. Per Dr Jefm Bryant    [provider]  latanoprost (XALATAN) 0.005 % ophthalmic solution Place 1 drop into both eyes at bedtime. 01/18/18   [provider]  losartan (COZAAR) 100 MG tablet TAKE 1 TABLET DAILY 10/15/20   Einar Pheasant, MD  lovastatin (MEVACOR) 40 MG tablet TAKE 1 TABLET DAILY 12/24/20   Einar Pheasant, MD  methotrexate 25 MG/ML SOLN Inject 17.5 mg into the skin once a week. Patient takes 17.5 mg (0.7 ml) on Monday or Tuesday of each week.    [provider]  mupirocin ointment (BACTROBAN) 2 % Apply 1 application topically 2 (two) times daily. 11/15/20   Einar Pheasant, MD  omeprazole (PRILOSEC) 20 MG capsule TAKE 1 CAPSULE DAILY 01/28/21   Einar Pheasant, MD  polyethylene glycol [facility] / GLYCOLAX) packet Take 17 g by mouth daily.    [provider]  Probiotic Product (ALIGN) 4 MG CAPS  Take one capsule daily 03/11/19   Einar Pheasant, MD  psyllium (METAMUCIL) 58.6 % packet Take 1 packet by mouth daily.    [provider]  triamcinolone cream (KENALOG) 0.1 % Apply 1 application topically 2 (two) times daily. 02/18/19   Einar Pheasant, MD    Allergies Carisoprodol, Metronidazole, Codeine, Nsaids, Skelaxin [metaxalone], Tenormin [atenolol], and Tramadol  Family History  Problem Relation Age of Onset   Ovarian cancer Mother    Renal Disease Mother    Heart disease Mother    Diabetes Mother    Hypertension Mother    Heart disease Father    Breast cancer Neg Hx     Social History Social History   Tobacco Use   Smoking status: Never   Smokeless tobacco: Never  Vaping Use   Vaping Use: Never used  Substance Use Topics   Alcohol use: No    Alcohol/week: 0.0 standard drinks   Drug  use: No    Review of Systems Constitutional: No fever/chills Eyes: No visual changes. ENT: No sore throat. Cardiovascular: Denies chest pain. Respiratory: Denies shortness of breath. Gastrointestinal: No abdominal pain.  No nausea, no vomiting.  No diarrhea.  No constipation. Genitourinary: Negative for dysuria. Musculoskeletal: Negative for neck pain.  Negative for back pain. Integumentary: Negative for rash. Neurological: Feeling of head fullness, not exactly headache, no focal weakness or numbness.   ____________________________________________   PHYSICAL EXAM:  VITAL SIGNS: ED Triage Vitals  Enc Vitals Group     BP 03/17/21 2154 (!) 184/63     Pulse Rate 03/17/21 2154 66     Resp 03/17/21 2154 16     Temp 03/17/21 2154 98 F (36.7 C)     Temp src --      SpO2 03/17/21 2154 99 %     Weight --      Height --      Head Circumference --      Peak Flow --      Pain Score 03/17/21 2155 3     Pain Loc --      Pain Edu? --      Excl. in [place]? --     Constitutional: Alert and oriented.  Elderly but sharp, oriented, quick and very appropriate  responses. Eyes: Conjunctivae are normal.  Head: Atraumatic. Nose: No congestion/rhinnorhea. Mouth/Throat: Patient is wearing a mask. Neck: No stridor.  No meningeal signs.   Cardiovascular: Normal rate, regular rhythm. Good peripheral circulation. Respiratory: Normal respiratory effort.  No retractions. Gastrointestinal: Soft and nontender. No distention.  Musculoskeletal: No lower extremity tenderness nor edema. No gross deformities of extremities. Neurologic:  Normal speech and language. No gross focal neurologic deficits are appreciated.  Skin:  Skin is warm, dry and intact. Psychiatric: Mood and affect are normal. Speech and behavior are normal.  ____________________________________________   LABS (all labs ordered are listed, but only abnormal results are displayed)  Labs Reviewed  BASIC METABOLIC PANEL - Abnormal; Notable for the following components:      Result Value   Sodium 134 (*)    Potassium 3.2 (*)    Glucose, Bld 120 (*)    BUN 24 (*)    All other components within normal limits  CBC WITH DIFFERENTIAL/PLATELET   ____________________________________________  EKG  ED ECG REPORT I, Hinda Kehr, the attending physician, personally viewed and interpreted this ECG.  Date: 03/17/2021 EKG Time: 22: 07 Rate: 57 Rhythm: Sinus bradycardia QRS Axis: normal Intervals: normal ST/T Wave abnormalities: Non-specific ST segment / T-wave changes, but no clear evidence of acute ischemia. Narrative Interpretation: no definitive evidence of acute ischemia; does not meet STEMI criteria.  ____________________________________________   INITIAL IMPRESSION / MDM / ASSESSMENT AND PLAN / ED COURSE  As part of my medical decision making, I reviewed the following data within the [place] notes reviewed and incorporated, Labs reviewed , EKG interpreted , and Notes from prior ED visits   Differential diagnosis includes, but is not limited to, essential  hypertension, metabolic or electrolyte abnormality, less likely intracranial hemorrhage or CVA.  Vital signs are stable and reassuring other than persistent hypertension.  She has no pain in her head, but a feeling of fullness that has been present for about a week.  It seems to correlate with her blood pressure being high, although she states that her blood pressure has been bouncing up and down.  No ischemia on EKG.  No respiratory symptoms.  No chest pain.  Lab work is reassuring with some mild hypokalemia which she said has also been an issue for an extended period of time and was one of the reasons that her blood pressure medicine is being changed around.  Creatinine is stable.  I had my usual and customary essential hypertension discussion with the patient.  She has the ability to follow-up closely with her PCP who is currently changing around her medications and I explained that I do not want to make additional changes which could complicate her outpatient management.  No indication for emergent imaging.  The patient seems to feel reassured and will follow up with a phone call to her PCP in the morning.  I encouraged her to continue taking her regular medications.  For her mild hypokalemia I gave her 40 mill equivalents of potassium by mouth and encouraged her to follow-up with her PCP about this as well.  I gave my usual and customary return precautions.           ____________________________________________  FINAL CLINICAL IMPRESSION(S) / ED DIAGNOSES  Final diagnoses:  Essential hypertension  Hypokalemia     MEDICATIONS GIVEN DURING THIS VISIT:  Medications  potassium chloride SA (KLOR-CON) CR tablet 40 mEq (has no administration in time range)     ED Discharge Orders     None        Note:  This document was prepared using Dragon voice recognition software and may include unintentional dictation errors.   Hinda Kehr, MD 03/18/21 [phone]
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
## 2021-03-18 NOTE — Assessment & Plan Note (Signed)
Follow cbc.  Recently admitted for GI bleed.

## 2021-03-19 ENCOUNTER — Other Ambulatory Visit: Payer: Self-pay

## 2021-03-19 ENCOUNTER — Other Ambulatory Visit: Payer: Self-pay | Admitting: Internal Medicine

## 2021-03-19 MED ORDER — HYDRALAZINE HCL 25 MG PO TABS: 25.0000 mg | ORAL_TABLET | Freq: Three times a day (TID) | ORAL | 2 refills | Status: DC

## 2021-03-19 MED ORDER — GABAPENTIN 100 MG PO CAPS: 100.0000 mg | ORAL_CAPSULE | Freq: Every day | ORAL | 1 refills | Status: DC

## 2021-03-19 NOTE — Telephone Encounter (Signed)
Ok to cancel 300mg  neurontin prescription and send in neurontin (gabapentin) 100mg  q hs. Make sure she does not take both.  Also, please schedule 4 week f/u.

## 2021-03-19 NOTE — Telephone Encounter (Signed)
Cancelled gabapentin 300. Sent in gabapentin 100. Scheduled 4wk f/u. Patient is aware of message below.

## 2021-03-19 NOTE — Telephone Encounter (Signed)
Please call and notify her that I sent in a new rx for hydralazine 25mg .  She currently has 10mg  tablets and was instructed to take 2 (10mg ) tablets tid until her current supply runs out.  I sent in 25mg  tablets and when she gets her new rx, she is to take one (25mg ) tablet tid.  Confirm doing ok.  Make sure has f/u appt scheduled.

## 2021-03-19 NOTE — Addendum Note (Signed)
Addended by: Alisa Graff on: 03/19/2021 02:45 AM   Modules accepted: Orders

## 2021-03-19 NOTE — Telephone Encounter (Signed)
Spoke with patient. She is aware of below. She mentioned on the phone that she wanted to decrease her gabapentin to 100 mg qhs. She does not like the 300 mg. Patient stated that she has talked to you about this. Also, patient has f/u scheduled in Feb per your last note. Do you want to see her sooner or just leave as scheduled and see how she does? She is ok with your preference.

## 2021-03-22 ENCOUNTER — Other Ambulatory Visit: Payer: Self-pay | Admitting: Internal Medicine

## 2021-03-22 DIAGNOSIS — E785 Hyperlipidemia, unspecified: Secondary | ICD-10-CM

## 2021-03-25 DIAGNOSIS — M05712 Rheumatoid arthritis with rheumatoid factor of left shoulder without organ or systems involvement: Secondary | ICD-10-CM | POA: Diagnosis not present

## 2021-03-25 DIAGNOSIS — M25512 Pain in left shoulder: Secondary | ICD-10-CM | POA: Diagnosis not present

## 2021-03-25 DIAGNOSIS — G8929 Other chronic pain: Secondary | ICD-10-CM | POA: Diagnosis not present

## 2021-03-25 DIAGNOSIS — M65332 Trigger finger, left middle finger: Secondary | ICD-10-CM | POA: Diagnosis not present

## 2021-03-27 ENCOUNTER — Other Ambulatory Visit: Payer: Medicare Other

## 2021-04-08 ENCOUNTER — Other Ambulatory Visit: Payer: Self-pay | Admitting: Internal Medicine

## 2021-04-11 ENCOUNTER — Other Ambulatory Visit: Payer: Self-pay

## 2021-04-11 ENCOUNTER — Ambulatory Visit (INDEPENDENT_AMBULATORY_CARE_PROVIDER_SITE_OTHER): Payer: Medicare Other | Admitting: Internal Medicine

## 2021-04-11 ENCOUNTER — Encounter: Payer: Self-pay | Admitting: Internal Medicine

## 2021-04-11 VITALS — BP 150/70 | HR 63 | Temp 98.0°F | Ht 61.0 in | Wt 105.2 lb

## 2021-04-11 DIAGNOSIS — R519 Headache, unspecified: Secondary | ICD-10-CM

## 2021-04-11 DIAGNOSIS — D649 Anemia, unspecified: Secondary | ICD-10-CM

## 2021-04-11 DIAGNOSIS — F439 Reaction to severe stress, unspecified: Secondary | ICD-10-CM

## 2021-04-11 DIAGNOSIS — I10 Essential (primary) hypertension: Secondary | ICD-10-CM | POA: Diagnosis not present

## 2021-04-11 DIAGNOSIS — I700 Atherosclerosis of aorta: Secondary | ICD-10-CM | POA: Diagnosis not present

## 2021-04-11 DIAGNOSIS — M069 Rheumatoid arthritis, unspecified: Secondary | ICD-10-CM

## 2021-04-11 DIAGNOSIS — E7800 Pure hypercholesterolemia, unspecified: Secondary | ICD-10-CM | POA: Diagnosis not present

## 2021-04-11 DIAGNOSIS — K862 Cyst of pancreas: Secondary | ICD-10-CM | POA: Diagnosis not present

## 2021-04-11 DIAGNOSIS — E871 Hypo-osmolality and hyponatremia: Secondary | ICD-10-CM

## 2021-04-11 DIAGNOSIS — K219 Gastro-esophageal reflux disease without esophagitis: Secondary | ICD-10-CM

## 2021-04-11 DIAGNOSIS — E876 Hypokalemia: Secondary | ICD-10-CM | POA: Diagnosis not present

## 2021-04-11 LAB — BASIC METABOLIC PANEL
BUN: 21 mg/dL (ref 6–23)
CO2: 28 mEq/L (ref 19–32)
Calcium: 9.8 mg/dL (ref 8.4–10.5)
Chloride: 98 mEq/L (ref 96–112)
Creatinine, Ser: 0.93 mg/dL (ref 0.40–1.20)
GFR: 55.85 mL/min — ABNORMAL LOW (ref >60.00)
Glucose, Bld: 91 mg/dL (ref 70–99)
Potassium: 3.4 mEq/L — ABNORMAL LOW (ref 3.5–5.1)
Sodium: 133 mEq/L — ABNORMAL LOW (ref 135–145)

## 2021-04-11 MED ORDER — SPIRONOLACTONE 25 MG PO TABS: 25.0000 mg | ORAL_TABLET | Freq: Every day | ORAL | 1 refills | Status: DC

## 2021-04-11 NOTE — Progress Notes (Signed)
Patient ID: Stacey Snyder, female   DOB: [birth year], 87 y.o.   MRN: [id number]   Subjective:    Patient ID: Stacey Snyder, female    DOB: 27-Aug-1932, 87 y.o.   MRN: [id number]  This visit occurred during the SARS-CoV-2 public health emergency.  Safety protocols were in place, including screening questions prior to the visit, additional usage of staff PPE, and extensive cleaning of exam room while observing appropriate contact time as indicated for disinfecting solutions.   Patient here for a scheduled follow up.   Chief Complaint  Patient presents with   Follow-up    4 week/ eyesight has spots/ gabapentin dosage change by patient   .   HPI Was seen in ER 03/18/21 - headache and elevated blood pressure.  She reported (on the day she was evaluated), her blood pressure continued to increase.  States head did not feel right.  ER evaluation - labs revealed low potassium.  No changes made.  States since this episodes she has noticed "black spots" - eyes.  On questioning, she denies any vision loss.  States when she looks up at the light -will notice black spots moving (like floaters).  Still with intermittent headache.  Not severe.  Will notice - if looks up - sensation change in her head.  No slurred speech.  No chest pain.  Breathing stable.  No sob.  Some sinus drainage.  No cough or congestion.  No vomiting.  No abdominal pain.  Eating.  States the 100mg  neurontin did not help.  Request to go back on 300mg  gabapentin.  Took 300mg  last night and slept better.     Past Medical History:  Diagnosis Date   Diverticulosis    Fibrocystic breast disease    GERD (gastroesophageal reflux disease)    Glaucoma 12/04/2020   History of kidney stones    Hypercholesterolemia    Hypertension    Hypertension    IBS (irritable bowel syndrome)    Inflammatory arthritis    positive anti-CCP abs, s/p prednisone, MTX, Remicade   Nephrolithiasis    Osteopenia    GI upset with Fosamax    Pancreatitis 2007   s/p ERCP   Pancreatitis    PONV (postoperative nausea and vomiting)    Renal insufficiency    Spinal stenosis of lumbosacral region    Past Surgical History:  Procedure Laterality Date   ABDOMINAL HYSTERECTOMY  1993   ovaries not removed   BREAST BIOPSY Left 1984   negative   BREAST CYST EXCISION Left 2010   negative   COLONOSCOPY N/A 12/05/2020   Procedure: COLONOSCOPY;  Surgeon: Lesly Rubenstein, MD;  Location: [facility] ENDOSCOPY;  Service: Endoscopy;  Laterality: N/A;   LITHOTRIPSY     LUMBAR LAMINECTOMY  1983   LUMBAR LAMINECTOMY/DECOMPRESSION MICRODISCECTOMY N/A 08/18/2016   Procedure: LUMBAR LAMINECTOMY/DECOMPRESSION MICRODISCECTOMY 1 LEVEL;  Surgeon: Bayard Hugger, MD;  Location: [facility] ORS;  Service: Neurosurgery;  Laterality: N/A;  L4-5 Laminectomy, MIS, L5 foraminotomy   TRIGGER FINGER RELEASE     right ring finger   Family History  Problem Relation Age of Onset   Ovarian cancer Mother    Renal Disease Mother    Heart disease Mother    Diabetes Mother    Hypertension Mother    Heart disease Father    Breast cancer Neg Hx    Social History   Socioeconomic History   Marital status: Married    Spouse name: Not on file   Number of children:  4   Years of education: Not on file   Highest education level: Not on file  Occupational History   Not on file  Tobacco Use   Smoking status: Never   Smokeless tobacco: Never  Vaping Use   Vaping Use: Never used  Substance and Sexual Activity   Alcohol use: No    Alcohol/week: 0.0 standard drinks   Drug use: No   Sexual activity: Not on file  Other Topics Concern   Not on file  Social History Narrative   Not on file   Social Determinants of Health   Financial Resource Strain: Not on file  Food Insecurity: Not on file  Transportation Needs: Not on file  Physical Activity: Not on file  Stress: Not on file  Social Connections: Not on file     Review of Systems  Constitutional:  Negative for  appetite change and unexpected weight change.  HENT:  Negative for sinus pressure.        Some sinus drainage.    Respiratory:  Negative for cough, chest tightness and shortness of breath.   Cardiovascular:  Negative for chest pain, palpitations and leg swelling.  Gastrointestinal:  Negative for abdominal pain, diarrhea and vomiting.  Genitourinary:  Negative for difficulty urinating and dysuria.  Musculoskeletal:  Negative for joint swelling and myalgias.  Skin:  Negative for color change and rash.  Neurological:  Positive for light-headedness and headaches.       Describes sensation change as outlined.    Psychiatric/Behavioral:  Negative for agitation and dysphoric mood.       Objective:     BP (!) 150/70   Pulse 63   Temp 98 F (36.7 C) (Oral)   Ht 5\' 1"  (1.549 m)   Wt 105 lb 3.2 oz (47.7 kg)   SpO2 99%   BMI 19.88 kg/m  Wt Readings from Last 3 Encounters:  04/11/21 105 lb 3.2 oz (47.7 kg)  03/14/21 107 lb (48.5 kg)  12/19/20 107 lb 3.2 oz (48.6 kg)   Not orthostatic on exam.  Physical Exam Vitals reviewed.  Constitutional:      General: She is not in acute distress.    Appearance: Normal appearance.  HENT:     Head: Normocephalic and atraumatic.     Right Ear: External ear normal.     Left Ear: External ear normal.  Eyes:     General: No scleral icterus.       Right eye: No discharge.        Left eye: No discharge.     Conjunctiva/sclera: Conjunctivae normal.  Neck:     Thyroid: No thyromegaly.  Cardiovascular:     Rate and Rhythm: Normal rate and regular rhythm.  Pulmonary:     Effort: No respiratory distress.     Breath sounds: Normal breath sounds. No wheezing.  Abdominal:     General: Bowel sounds are normal.     Palpations: Abdomen is soft.     Tenderness: There is no abdominal tenderness.  Musculoskeletal:        General: No swelling or tenderness.     Cervical back: Neck supple. No tenderness.  Lymphadenopathy:     Cervical: No cervical  adenopathy.  Skin:    Findings: No erythema or rash.  Neurological:     Mental Status: She is alert.  Psychiatric:        Mood and Affect: Mood normal.        Behavior: Behavior normal.  Outpatient Encounter Medications as of 04/11/2021  Medication Sig   celecoxib (CELEBREX) 100 MG capsule Take 100 mg by mouth 2 (two) times daily.   Cholecalciferol (VITAMIN D) 2000 UNITS tablet Take 2,000 Units by mouth daily.   cyanocobalamin 1000 MCG tablet Take 1,000 mcg by mouth daily.   DILT-XR 240 MG 24 hr capsule TAKE 1 CAPSULE DAILY   dorzolamide-timolol (COSOPT) 22.3-6.8 MG/ML ophthalmic solution INSTILL 1 DROP INTO EACH EYE TWICE DAILY   fluticasone (FLONASE) 50 MCG/ACT nasal spray USE TWO SPRAY(S) IN EACH NOSTRIL ONCE DAILY   gabapentin (NEURONTIN) 300 MG capsule Take 1 capsule (300 mg total) by mouth at bedtime.   InFLIXimab (REMICADE IV) Inject into the vein every 6 (six) weeks. Per Dr Jefm Bryant   latanoprost (XALATAN) 0.005 % ophthalmic solution Place 1 drop into both eyes at bedtime.   losartan (COZAAR) 100 MG tablet TAKE 1 TABLET DAILY   lovastatin (MEVACOR) 40 MG tablet TAKE 1 TABLET DAILY   methotrexate 25 MG/ML SOLN Inject 17.5 mg into the skin once a week. Patient takes 17.5 mg (0.7 ml) on Monday or Tuesday of each week.   mupirocin ointment (BACTROBAN) 2 % Apply 1 application topically 2 (two) times daily.   omeprazole (PRILOSEC) 20 MG capsule TAKE 1 CAPSULE DAILY   polyethylene glycol (MIRALAX / GLYCOLAX) packet Take 17 g by mouth daily.   Probiotic Product (ALIGN) 4 MG CAPS Take one capsule daily   psyllium (METAMUCIL) 58.6 % packet Take 1 packet by mouth daily.   spironolactone (ALDACTONE) 25 MG tablet Take 1 tablet (25 mg total) by mouth daily.   triamcinolone cream (KENALOG) 0.1 % Apply 1 application topically 2 (two) times daily.   [DISCONTINUED] gabapentin (NEURONTIN) 100 MG capsule Take 1 capsule (100 mg total) by mouth at bedtime.   [DISCONTINUED] hydrALAZINE  (APRESOLINE) 25 MG tablet Take 1 tablet (25 mg total) by mouth 3 (three) times daily.   No facility-administered encounter medications on file as of 04/11/2021.     Lab Results  Component Value Date   WBC 7.9 03/17/2021   HGB 13.1 03/17/2021   HCT 39.4 03/17/2021   PLT 248 03/17/2021   GLUCOSE 91 04/11/2021   CHOL 182 03/14/2021   TRIG 102.0 03/14/2021   HDL 72.90 03/14/2021   LDLCALC 89 03/14/2021   ALT 12 03/14/2021   AST 22 03/14/2021   NA 133 (L) 04/11/2021   K 3.4 (L) 04/11/2021   CL 98 04/11/2021   CREATININE 0.93 04/11/2021   BUN 21 04/11/2021   CO2 28 04/11/2021   TSH 1.74 04/17/2020   INR 1.0 12/03/2020       Assessment & Plan:   Problem List Items Addressed This Visit     Anemia    Follow cbc.       Aortic atherosclerosis (HCC)    Continue lovastatin.      Relevant Medications   spironolactone (ALDACTONE) 25 MG tablet   Essential hypertension    Currently on losartan and diltiazem. Off triamterene/HCTZ.  On hydralazine 25 mg-  3 times daily.  Questioning if this is causing her to feel worse.  Will stop hydralazine.  Start aldactone 25mg  q day.  Pressure remains elevated.  See if getting blood pressure under better control - helps symptoms.  Follow pressures.  Recheck metabolic panel.        Relevant Medications   spironolactone (ALDACTONE) 25 MG tablet   GERD (gastroesophageal reflux disease)    No upper symptoms reported.  Continue Prilosec.  Headache    Headaches and sensation change as outlined.  Adjust blood pressure medication as outlined.  Stop hydralazine - feels not tolerating.  Start aldactone 25mg  q day.  Follow pressures.  Check metabolic panel.  Will need to follow electrolytes closely.  Also discussed further w/up for persistent intermittent headache and "light headedness" - MRI brain and carotid ultrasound.  Unable to take aspirin - GI bleed.  Has appt with ophthalmology 05/08/21.  flonase to help with sinus drainage.       Relevant  Medications   gabapentin (NEURONTIN) 300 MG capsule   Hypercholesterolemia    Continue lovastatin.  Follow lipid panel and liver function tests.       Relevant Medications   spironolactone (ALDACTONE) 25 MG tablet   Hypokalemia    Potassium replaced in ER.  On no potassium now.  Start aldactone as outlined.  Follow electrolytes closely.        Relevant Orders   Basic metabolic panel (Completed)   Hyponatremia - Primary    Off triam/hctz.  Trial of spironolactone.  She is eating.  Follow electrolytes closely.        Relevant Orders   Basic metabolic panel (Completed)   Pancreas cyst    Evaluated by GI 09/10/2020-pancreatic cyst seen on CT-plan MRI follow-up in 1 year      Rheumatoid arthritis ([place])    Followed by rheumatology.  Has been receiving Remicade and MTX.       Stress    Discussed increased stress.  She has good support.  Does not feel needs any further intervention.  Follow.         Einar Pheasant, MD

## 2021-04-11 NOTE — Patient Instructions (Signed)
Stop hydralazine.    Start spironolactone 25mg  - one tablet per day.     Start flonase nasal spray - 2 sprays each nostril one time per day.

## 2021-04-12 ENCOUNTER — Encounter: Payer: Self-pay | Admitting: Internal Medicine

## 2021-04-12 ENCOUNTER — Other Ambulatory Visit: Payer: Self-pay | Admitting: Internal Medicine

## 2021-04-12 DIAGNOSIS — R42 Dizziness and giddiness: Secondary | ICD-10-CM

## 2021-04-12 DIAGNOSIS — R519 Headache, unspecified: Secondary | ICD-10-CM | POA: Insufficient documentation

## 2021-04-12 MED ORDER — GABAPENTIN 300 MG PO CAPS: 300.0000 mg | ORAL_CAPSULE | Freq: Every day | ORAL | 2 refills | Status: DC
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
## 2021-04-12 NOTE — Assessment & Plan Note (Addendum)
Headaches and sensation change as outlined.  Adjust blood pressure medication as outlined.  Stop hydralazine - feels not tolerating.  Start aldactone 25mg  q day.  Follow pressures.  Check metabolic panel.  Will need to follow electrolytes closely.  Also discussed further w/up for persistent intermittent headache and "light headedness" - MRI brain and carotid ultrasound.  Unable to take aspirin - GI bleed.  Has appt with ophthalmology 05/08/21.  flonase to help with sinus drainage.

## 2021-04-12 NOTE — Assessment & Plan Note (Signed)
Currently on losartan and diltiazem. Off triamterene/HCTZ.  On hydralazine 25 mg-  3 times daily.  Questioning if this is causing her to feel worse.  Will stop hydralazine.  Start aldactone 25mg  q day.  Pressure remains elevated.  See if getting blood pressure under better control - helps symptoms.  Follow pressures.  Recheck metabolic panel.

## 2021-04-12 NOTE — Assessment & Plan Note (Signed)
Potassium replaced in ER.  On no potassium now.  Start aldactone as outlined.  Follow electrolytes closely.

## 2021-04-12 NOTE — Assessment & Plan Note (Signed)
Continue lovastatin 

## 2021-04-12 NOTE — Assessment & Plan Note (Signed)
Followed by rheumatology.  Has been receiving Remicade and MTX.  

## 2021-04-12 NOTE — Assessment & Plan Note (Signed)
Off triam/hctz.  Trial of spironolactone.  She is eating.  Follow electrolytes closely.

## 2021-04-12 NOTE — Assessment & Plan Note (Signed)
Continue lovastatin.  Follow lipid panel and liver function tests.   

## 2021-04-12 NOTE — Assessment & Plan Note (Signed)
Discussed increased stress.  She has good support.  Does not feel needs any further intervention.  Follow.

## 2021-04-12 NOTE — Progress Notes (Signed)
Order placed for MRI

## 2021-04-12 NOTE — Addendum Note (Signed)
Addended by: Nanci Pina on: 04/12/2021 01:28 PM   Modules accepted: Orders

## 2021-04-12 NOTE — Assessment & Plan Note (Signed)
No upper symptoms reported.  Continue Prilosec. 

## 2021-04-12 NOTE — Assessment & Plan Note (Signed)
Follow cbc.  

## 2021-04-18 ENCOUNTER — Other Ambulatory Visit: Payer: Medicare Other

## 2021-04-19 ENCOUNTER — Other Ambulatory Visit: Payer: Self-pay

## 2021-04-19 ENCOUNTER — Other Ambulatory Visit (INDEPENDENT_AMBULATORY_CARE_PROVIDER_SITE_OTHER): Payer: Medicare Other

## 2021-04-19 DIAGNOSIS — E876 Hypokalemia: Secondary | ICD-10-CM | POA: Diagnosis not present

## 2021-04-19 DIAGNOSIS — E871 Hypo-osmolality and hyponatremia: Secondary | ICD-10-CM

## 2021-04-19 LAB — BASIC METABOLIC PANEL
BUN: 25 mg/dL — ABNORMAL HIGH (ref 6–23)
CO2: 27 mEq/L (ref 19–32)
Calcium: 9.7 mg/dL (ref 8.4–10.5)
Chloride: 97 mEq/L (ref 96–112)
Creatinine, Ser: 1.17 mg/dL (ref 0.40–1.20)
GFR: 42.4 mL/min — ABNORMAL LOW (ref >60.00)
Glucose, Bld: 98 mg/dL (ref 70–99)
Potassium: 4.4 mEq/L (ref 3.5–5.1)
Sodium: 134 mEq/L — ABNORMAL LOW (ref 135–145)

## 2021-04-21 ENCOUNTER — Ambulatory Visit
Admission: EM | Admit: 2021-04-21 | Discharge: 2021-04-21 | Disposition: A | Discharge status: Home / Self Care | Payer: Medicare Other | Attending: Emergency Medicine | Admitting: Emergency Medicine

## 2021-04-21 ENCOUNTER — Other Ambulatory Visit: Payer: Self-pay

## 2021-04-21 DIAGNOSIS — N3000 Acute cystitis without hematuria: Secondary | ICD-10-CM | POA: Diagnosis not present

## 2021-04-21 LAB — URINALYSIS, COMPLETE (UACMP) WITH MICROSCOPIC
Bilirubin Urine: NEGATIVE
Glucose, UA: NEGATIVE mg/dL
Ketones, ur: NEGATIVE mg/dL
Leukocytes,Ua: NEGATIVE
Nitrite: NEGATIVE
Protein, ur: NEGATIVE mg/dL
Specific Gravity, Urine: 1.015 (ref 1.005–1.030)
pH: 8 (ref 5.0–8.0)

## 2021-04-21 MED ORDER — CEPHALEXIN 500 MG PO CAPS: 500.0000 mg | ORAL_CAPSULE | Freq: Two times a day (BID) | ORAL | 0 refills | Status: AC

## 2021-04-21 MED ORDER — PHENAZOPYRIDINE HCL 200 MG PO TABS: 200.0000 mg | ORAL_TABLET | Freq: Three times a day (TID) | ORAL | 0 refills | Status: DC

## 2021-04-21 NOTE — ED Provider Notes (Signed)
[facility]    CSN: [id number] Arrival date & time: 04/21/21  0900      History   Chief Complaint Chief Complaint  Patient presents with   Possible UTI    HPI Stacey Snyder is a 87 y.o. female.   HPI  87 year old female here for evaluation of urinary complaints.  Patient reports that she developed pain with urination yesterday along with increased urinary urgency, frequency, and nocturia.  She reports that it hurts when she pees and she states that when she urinates she feels the pain go all through her body.  She denies any fever, low back pain, blood in her urine, cloudiness to her urine, or abdominal pain.  Patient states that she gets UTIs about once a year.  Past Medical History:  Diagnosis Date   Diverticulosis    Fibrocystic breast disease    GERD (gastroesophageal reflux disease)    Glaucoma 12/04/2020   History of kidney stones    Hypercholesterolemia    Hypertension    Hypertension    IBS (irritable bowel syndrome)    Inflammatory arthritis    positive anti-CCP abs, s/p prednisone, MTX, Remicade   Nephrolithiasis    Osteopenia    GI upset with Fosamax   Pancreatitis 2007   s/p ERCP   Pancreatitis    PONV (postoperative nausea and vomiting)    Renal insufficiency    Spinal stenosis of lumbosacral region     Patient Active Problem List   Diagnosis Date Noted   Headache 04/12/2021   Glaucoma 12/04/2020   Lower GI bleed 12/03/2020   Hypokalemia 12/03/2020   Pancreas cyst 09/12/2020   Aortic atherosclerosis ([place]) 07/30/2020   Diarrhea 07/10/2020   Right shoulder pain 02/19/2019   Rash 02/19/2019   MGUS (monoclonal gammopathy of unknown significance) 01/19/2018   Cramps, extremity 12/02/2017   Abdominal bruit 01/16/2017   Lumbar stenosis 08/18/2016   Abdominal pain 12/03/2015   Abnormal liver function tests 10/18/2015   Anemia 10/18/2015   Hyponatremia 10/07/2015   Lip lesion 04/23/2015   Scalp lesion 04/23/2015   Loss of  weight 04/23/2015   Stress 04/23/2015   Left shoulder pain 12/20/2014   UTI (urinary tract infection) 08/20/2014   Health care maintenance 08/20/2014   Skin lesion 04/17/2014   Fatigue 04/13/2014   Shoulder pain, right 12/17/2013   Trigger finger 08/21/2013   Left hip pain 04/17/2013   GERD (gastroesophageal reflux disease) 04/03/2012   Rheumatoid arthritis ([place]) 03/30/2012   Diverticulosis 03/30/2012   Osteopenia 03/30/2012   Essential hypertension 03/30/2012   Hypercholesterolemia 03/30/2012    Past Surgical History:  Procedure Laterality Date   ABDOMINAL HYSTERECTOMY  1993   ovaries not removed   BREAST BIOPSY Left 1984   negative   BREAST CYST EXCISION Left 2010   negative   COLONOSCOPY N/A 12/05/2020   Procedure: COLONOSCOPY;  Surgeon: Lesly Rubenstein, MD;  Location: [facility];  Service: Endoscopy;  Laterality: N/A;   LITHOTRIPSY     LUMBAR LAMINECTOMY  1983   LUMBAR LAMINECTOMY/DECOMPRESSION MICRODISCECTOMY N/A 08/18/2016   Procedure: LUMBAR LAMINECTOMY/DECOMPRESSION MICRODISCECTOMY 1 LEVEL;  Surgeon: Bayard Hugger, MD;  Location: [facility] ORS;  Service: Neurosurgery;  Laterality: N/A;  L4-5 Laminectomy, MIS, L5 foraminotomy   TRIGGER FINGER RELEASE     right ring finger    OB History   No obstetric history on file.      Home Medications    Prior to Admission medications   Medication Sig Start Date End  Date Taking? Authorizing Provider  celecoxib (CELEBREX) 100 MG capsule Take 100 mg by mouth 2 (two) times daily. 12/13/20  Yes [provider]  cephALEXin (KEFLEX) 500 MG capsule Take 1 capsule (500 mg total) by mouth 2 (two) times daily for 5 days. 04/21/21 04/26/21 Yes Margarette Canada, NP  Cholecalciferol (VITAMIN D) 2000 UNITS tablet Take 2,000 Units by mouth daily.   Yes [provider]  cyanocobalamin 1000 MCG tablet Take 1,000 mcg by mouth daily.   Yes [provider]  DILT-XR 240 MG 24 hr capsule TAKE 1 CAPSULE DAILY 12/24/20  Yes  Einar Pheasant, MD  dorzolamide-timolol (COSOPT) 22.3-6.8 MG/ML ophthalmic solution INSTILL 1 DROP INTO EACH EYE TWICE DAILY 12/17/17  Yes [provider]  fluticasone (FLONASE) 50 MCG/ACT nasal spray USE TWO SPRAY(S) IN EACH NOSTRIL ONCE DAILY 01/14/17  Yes Einar Pheasant, MD  gabapentin (NEURONTIN) 300 MG capsule Take 1 capsule (300 mg total) by mouth at bedtime. 04/12/21  Yes Einar Pheasant, MD  InFLIXimab (REMICADE IV) Inject into the vein every 6 (six) weeks. Per Dr Jefm Bryant   Yes [provider]  latanoprost (XALATAN) 0.005 % ophthalmic solution Place 1 drop into both eyes at bedtime. 01/18/18  Yes [provider]  losartan (COZAAR) 100 MG tablet TAKE 1 TABLET DAILY 04/08/21  Yes Einar Pheasant, MD  lovastatin (MEVACOR) 40 MG tablet TAKE 1 TABLET DAILY 03/22/21  Yes Einar Pheasant, MD  methotrexate 25 MG/ML SOLN Inject 17.5 mg into the skin once a week. Patient takes 17.5 mg (0.7 ml) on Monday or Tuesday of each week.   Yes [provider]  mupirocin ointment (BACTROBAN) 2 % Apply 1 application topically 2 (two) times daily. 11/15/20  Yes Einar Pheasant, MD  omeprazole (PRILOSEC) 20 MG capsule TAKE 1 CAPSULE DAILY 01/28/21  Yes Einar Pheasant, MD  phenazopyridine (PYRIDIUM) 200 MG tablet Take 1 tablet (200 mg total) by mouth 3 (three) times daily. 04/21/21  Yes Margarette Canada, NP  polyethylene glycol (MIRALAX / GLYCOLAX) packet Take 17 g by mouth daily.   Yes [provider]  Probiotic Product (ALIGN) 4 MG CAPS Take one capsule daily 03/11/19  Yes Einar Pheasant, MD  psyllium (METAMUCIL) 58.6 % packet Take 1 packet by mouth daily.   Yes [provider]  spironolactone (ALDACTONE) 25 MG tablet Take 1 tablet (25 mg total) by mouth daily. 04/11/21  Yes Einar Pheasant, MD  triamcinolone cream (KENALOG) 0.1 % Apply 1 application topically 2 (two) times daily. 02/18/19  Yes Einar Pheasant, MD    Family History Family History  Problem Relation  Age of Onset   Ovarian cancer Mother    Renal Disease Mother    Heart disease Mother    Diabetes Mother    Hypertension Mother    Heart disease Father    Breast cancer Neg Hx     Social History Social History   Tobacco Use   Smoking status: Never   Smokeless tobacco: Never  Vaping Use   Vaping Use: Never used  Substance Use Topics   Alcohol use: No    Alcohol/week: 0.0 standard drinks   Drug use: No     Allergies   Carisoprodol, Metronidazole, Codeine, Nsaids, Skelaxin [metaxalone], Tenormin [atenolol], and Tramadol   Review of Systems Review of Systems  Constitutional:  Negative for activity change, appetite change and fever.  Gastrointestinal:  Negative for abdominal pain.  Genitourinary:  Positive for dysuria, frequency and urgency.  Musculoskeletal:  Negative for back pain.  Physical Exam Triage Vital Signs ED Triage Vitals  Enc Vitals Group     BP 04/21/21 1000 (!) 161/66     Pulse Rate 04/21/21 1000 88     Resp 04/21/21 1000 18     Temp 04/21/21 1000 98.3 F (36.8 C)     Temp Source 04/21/21 0956 Oral     SpO2 04/21/21 1000 98 %     Weight 04/21/21 0954 105 lb 2.6 oz (47.7 kg)     Height --      Head Circumference --      Peak Flow --      Pain Score 04/21/21 0958 10     Pain Loc --      Pain Edu? --      Excl. in [place]? --    No data found.  Updated Vital Signs BP (!) 161/66 (BP Location: Left Arm)    Pulse 88    Temp 98.3 F (36.8 C) (Oral)    Resp 18    Wt 105 lb 2.6 oz (47.7 kg)    SpO2 98%    BMI 19.87 kg/m   Visual Acuity Right Eye Distance:   Left Eye Distance:   Bilateral Distance:    Right Eye Near:   Left Eye Near:    Bilateral Near:     Physical Exam Vitals and nursing note reviewed.  Constitutional:      General: She is not in acute distress.    Appearance: Normal appearance. She is normal weight. She is not ill-appearing.  HENT:     Head: Normocephalic and atraumatic.  Cardiovascular:     Rate and Rhythm: Normal rate  and regular rhythm.     Pulses: Normal pulses.     Heart sounds: Normal heart sounds. No murmur heard.   No gallop.  Pulmonary:     Effort: Pulmonary effort is normal.     Breath sounds: Normal breath sounds. No wheezing, rhonchi or rales.  Abdominal:     General: Abdomen is flat.     Palpations: Abdomen is soft.     Tenderness: There is no abdominal tenderness. There is no right CVA tenderness or left CVA tenderness.  Skin:    General: Skin is warm and dry.     Capillary Refill: Capillary refill takes less than 2 seconds.     Findings: No erythema or rash.  Neurological:     General: No focal deficit present.     Mental Status: She is alert and oriented to person, place, and time.  Psychiatric:        Mood and Affect: Mood normal.        Behavior: Behavior normal.        Thought Content: Thought content normal.        Judgment: Judgment normal.     UC Treatments / Results  Labs (all labs ordered are listed, but only abnormal results are displayed) Labs Reviewed  URINALYSIS, COMPLETE (UACMP) WITH MICROSCOPIC - Abnormal; Notable for the following components:      Result Value   Hgb urine dipstick TRACE (*)    Bacteria, UA RARE (*)    All other components within normal limits  URINE CULTURE    EKG   Radiology No results found.  Procedures Procedures (including critical care time)  Medications Ordered in UC Medications - No data to display  Initial Impression / Assessment and Plan / UC Course  I have reviewed the triage vital signs and the nursing notes.  Pertinent labs & imaging results that were available during my care of the patient were reviewed by me and considered in my medical decision making (see chart for details).  Patient is a nontoxic-appearing 77 old female here for evaluation of painful urination with increased urgency, frequency, and nocturia that started yesterday.  She denies any cloudiness or blood in her urine.  No low back pain, fever, or  abdominal pain.  She reports that when she urinates she feels the pain radiate throughout her whole body but then dissipates when its resolved.  She has a history of UTIs and states that she gets them about once a year with similar symptoms.  Urinalysis collected at triage.  Urinalysis positive for trace hemoglobin, 11-20 WBCs, and rare bacteria.  Leukocyte Estrace and nitrites are negative.  No protein.  Specific gravity is normal at 1.015.  We will send urine for culture.  Given patient's symptomology and the white blood cells in her urine we will treat patient for UTI with Keflex twice daily for 5 days.  I will also give prescription for Pyridium that she can take every 8 hours as needed for urinary discomfort.  She has had this before and has tolerated it well.  If the urine culture grows out something that does not respond to the Keflex therapy can be adjusted at that time.   Final Clinical Impressions(s) / UC Diagnoses   Final diagnoses:  Acute cystitis without hematuria     Discharge Instructions      Take the Keflex twice daily for 5 days with food for treatment of urinary tract infection.  Use the Pyridium every 8 hours as needed for urinary discomfort.  This will turn your urine a bright red-orange.  Increase your oral fluid intake so that you increase your urine production and or flushing your urinary system.  Take an over-the-counter probiotic, such as Culturelle-Align-Activia, 1 hour after each dose of antibiotic to prevent diarrhea or yeast infections from forming.  We will culture urine and change the antibiotics if necessary.  Return for reevaluation, or see your primary care provider, for any new or worsening symptoms.      ED Prescriptions     Medication Sig Dispense Auth. Provider   cephALEXin (KEFLEX) 500 MG capsule Take 1 capsule (500 mg total) by mouth 2 (two) times daily for 5 days. 20 capsule Margarette Canada, NP   phenazopyridine (PYRIDIUM) 200 MG tablet Take  1 tablet (200 mg total) by mouth 3 (three) times daily. 6 tablet Margarette Canada, NP      PDMP not reviewed this encounter.   Margarette Canada, NP 04/21/21 1222

## 2021-04-21 NOTE — Discharge Instructions (Addendum)
Take the Keflex twice daily for 5 days with food for treatment of urinary tract infection.  Use the Pyridium every 8 hours as needed for urinary discomfort.  This will turn your urine a bright red-orange.  Increase your oral fluid intake so that you increase your urine production and or flushing your urinary system.  Take an over-the-counter probiotic, such as Culturelle-Align-Activia, 1 hour after each dose of antibiotic to prevent diarrhea or yeast infections from forming.  We will culture urine and change the antibiotics if necessary.  Return for reevaluation, or see your primary care provider, for any new or worsening symptoms.  

## 2021-04-21 NOTE — ED Triage Notes (Signed)
Patient is here today for "Possible UTI". Dysuria "hurts all over body when voiding". Started "yesterday morning". Urinary hesitancy. No Fever.

## 2021-04-23 ENCOUNTER — Ambulatory Visit
Admission: RE | Admit: 2021-04-23 | Discharge: 2021-04-23 | Disposition: A | Discharge status: Home / Self Care | Payer: Medicare Other | Source: Ambulatory Visit | Attending: Internal Medicine | Admitting: Internal Medicine

## 2021-04-23 ENCOUNTER — Other Ambulatory Visit: Payer: Self-pay

## 2021-04-23 DIAGNOSIS — G319 Degenerative disease of nervous system, unspecified: Secondary | ICD-10-CM | POA: Diagnosis not present

## 2021-04-23 DIAGNOSIS — R42 Dizziness and giddiness: Secondary | ICD-10-CM

## 2021-04-23 DIAGNOSIS — J3489 Other specified disorders of nose and nasal sinuses: Secondary | ICD-10-CM | POA: Diagnosis not present

## 2021-04-23 LAB — URINE CULTURE
Culture: 80000 — AB
Special Requests: NORMAL

## 2021-04-24 ENCOUNTER — Other Ambulatory Visit: Payer: Self-pay | Admitting: Internal Medicine

## 2021-04-24 DIAGNOSIS — R42 Dizziness and giddiness: Secondary | ICD-10-CM

## 2021-04-24 DIAGNOSIS — D329 Benign neoplasm of meninges, unspecified: Secondary | ICD-10-CM

## 2021-04-24 NOTE — Progress Notes (Signed)
Order placed for neuroogy referral.

## 2021-05-08 DIAGNOSIS — H4043X4 Glaucoma secondary to eye inflammation, bilateral, indeterminate stage: Secondary | ICD-10-CM | POA: Diagnosis not present

## 2021-05-13 ENCOUNTER — Ambulatory Visit (INDEPENDENT_AMBULATORY_CARE_PROVIDER_SITE_OTHER): Payer: Medicare Other | Admitting: Internal Medicine

## 2021-05-13 ENCOUNTER — Other Ambulatory Visit: Payer: Self-pay

## 2021-05-13 ENCOUNTER — Encounter: Payer: Self-pay | Admitting: Internal Medicine

## 2021-05-13 VITALS — BP 130/62 | HR 50 | Temp 97.5°F | Ht 61.0 in | Wt 102.6 lb

## 2021-05-13 DIAGNOSIS — E871 Hypo-osmolality and hyponatremia: Secondary | ICD-10-CM

## 2021-05-13 DIAGNOSIS — K219 Gastro-esophageal reflux disease without esophagitis: Secondary | ICD-10-CM | POA: Diagnosis not present

## 2021-05-13 DIAGNOSIS — D649 Anemia, unspecified: Secondary | ICD-10-CM

## 2021-05-13 DIAGNOSIS — D472 Monoclonal gammopathy: Secondary | ICD-10-CM | POA: Diagnosis not present

## 2021-05-13 DIAGNOSIS — R634 Abnormal weight loss: Secondary | ICD-10-CM | POA: Diagnosis not present

## 2021-05-13 DIAGNOSIS — E7800 Pure hypercholesterolemia, unspecified: Secondary | ICD-10-CM | POA: Diagnosis not present

## 2021-05-13 DIAGNOSIS — F439 Reaction to severe stress, unspecified: Secondary | ICD-10-CM | POA: Diagnosis not present

## 2021-05-13 DIAGNOSIS — M069 Rheumatoid arthritis, unspecified: Secondary | ICD-10-CM | POA: Diagnosis not present

## 2021-05-13 DIAGNOSIS — I700 Atherosclerosis of aorta: Secondary | ICD-10-CM | POA: Diagnosis not present

## 2021-05-13 DIAGNOSIS — I10 Essential (primary) hypertension: Secondary | ICD-10-CM

## 2021-05-13 LAB — BASIC METABOLIC PANEL
BUN: 27 mg/dL — ABNORMAL HIGH (ref 6–23)
CO2: 26 mEq/L (ref 19–32)
Calcium: 9.8 mg/dL (ref 8.4–10.5)
Chloride: 92 mEq/L — ABNORMAL LOW (ref 96–112)
Creatinine, Ser: 1.09 mg/dL (ref 0.40–1.20)
GFR: 46.14 mL/min — ABNORMAL LOW (ref >60.00)
Glucose, Bld: 95 mg/dL (ref 70–99)
Potassium: 4.2 mEq/L (ref 3.5–5.1)
Sodium: 125 mEq/L — ABNORMAL LOW (ref 135–145)

## 2021-05-13 LAB — HEPATIC FUNCTION PANEL
ALT: 12 U/L (ref 0–35)
AST: 22 U/L (ref 0–37)
Albumin: 4.3 g/dL (ref 3.5–5.2)
Alkaline Phosphatase: 67 U/L (ref 39–117)
Bilirubin, Direct: 0.1 mg/dL (ref 0.0–0.3)
Total Bilirubin: 0.7 mg/dL (ref 0.2–1.2)
Total Protein: 7.4 g/dL (ref 6.0–8.3)

## 2021-05-13 MED ORDER — SPIRONOLACTONE 25 MG PO TABS: 25.0000 mg | ORAL_TABLET | Freq: Every day | ORAL | 1 refills | Status: DC

## 2021-05-13 MED ORDER — GABAPENTIN 300 MG PO CAPS: 300.0000 mg | ORAL_CAPSULE | Freq: Every day | ORAL | 1 refills | Status: DC

## 2021-05-13 NOTE — Progress Notes (Signed)
Patient ID: Stacey Snyder, female   DOB: [birth year], 88 y.o.   MRN: [id number]   Subjective:    Patient ID: Stacey Snyder, female    DOB: 04-Feb-1933, 88 y.o.   MRN: [id number]  This visit occurred during the SARS-CoV-2 public health emergency.  Safety protocols were in place, including screening questions prior to the visit, additional usage of staff PPE, and extensive cleaning of exam room while observing appropriate contact time as indicated for disinfecting solutions.   Patient here for scheduled follow up.   Marland Kitchen  HPI Here to follow up regarding her blood pressure, headache and dizziness.  Last visit, was having problems with persistent elevated blood pressure.  Hydralazine was stopped.  Started on aldactone.  She reports her head is better.  Saw eye MD last week (Dr Michelene Heady) and he changed her glasses.  Has macular degeneration.  Blood pressure is better.  No chest pain.  Breathing stable.  No increased cough or congestion.  State that she has noticed at times - a feeling of a knot - top of her stomach.  (Feels more crowded).  No vomiting.  Bowels moving.  Handling stress.     Past Medical History:  Diagnosis Date   Diverticulosis    Fibrocystic breast disease    GERD (gastroesophageal reflux disease)    Glaucoma 12/04/2020   History of kidney stones    Hypercholesterolemia    Hypertension    Hypertension    IBS (irritable bowel syndrome)    Inflammatory arthritis    positive anti-CCP abs, s/p prednisone, MTX, Remicade   Nephrolithiasis    Osteopenia    GI upset with Fosamax   Pancreatitis 2007   s/p ERCP   Pancreatitis    PONV (postoperative nausea and vomiting)    Renal insufficiency    Spinal stenosis of lumbosacral region    Past Surgical History:  Procedure Laterality Date   ABDOMINAL HYSTERECTOMY  1993   ovaries not removed   BREAST BIOPSY Left 1984   negative   BREAST CYST EXCISION Left 2010   negative   COLONOSCOPY N/A 12/05/2020   Procedure:  COLONOSCOPY;  Surgeon: Lesly Rubenstein, MD;  Location: [facility];  Service: Endoscopy;  Laterality: N/A;   LITHOTRIPSY     LUMBAR LAMINECTOMY  1983   LUMBAR LAMINECTOMY/DECOMPRESSION MICRODISCECTOMY N/A 08/18/2016   Procedure: LUMBAR LAMINECTOMY/DECOMPRESSION MICRODISCECTOMY 1 LEVEL;  Surgeon: Bayard Hugger, MD;  Location: [facility];  Service: Neurosurgery;  Laterality: N/A;  L4-5 Laminectomy, MIS, L5 foraminotomy   TRIGGER FINGER RELEASE     right ring finger   Family History  Problem Relation Age of Onset   Ovarian cancer Mother    Renal Disease Mother    Heart disease Mother    Diabetes Mother    Hypertension Mother    Heart disease Father    Breast cancer Neg Hx    Social History   Socioeconomic History   Marital status: Married    Spouse name: Not on file   Number of children: 4   Years of education: Not on file   Highest education level: Not on file  Occupational History   Not on file  Tobacco Use   Smoking status: Never   Smokeless tobacco: Never  Vaping Use   Vaping Use: Never used  Substance and Sexual Activity   Alcohol use: No    Alcohol/week: 0.0 standard drinks   Drug use: No   Sexual activity: Not on file  Other Topics Concern  Not on file  Social History Narrative   Not on file   Social Determinants of Health   Financial Resource Strain: Not on file  Food Insecurity: Not on file  Transportation Needs: Not on file  Physical Activity: Not on file  Stress: Not on file  Social Connections: Not on file    Review of Systems  Constitutional:        Is eating.  Weight is down a few more pounds.    HENT:  Negative for congestion and sinus pressure.   Respiratory:  Negative for cough, chest tightness and shortness of breath.   Cardiovascular:  Negative for chest pain, palpitations and leg swelling.  Gastrointestinal:  Negative for abdominal pain, diarrhea, nausea and vomiting.  Genitourinary:  Negative for difficulty urinating and dysuria.   Musculoskeletal:  Negative for joint swelling and myalgias.  Skin:  Negative for color change and rash.  Neurological:        Dizziness better.  Head feels better.    Psychiatric/Behavioral:  Negative for agitation and dysphoric mood.       Objective:     BP 130/62 (BP Location: Left Arm, Patient Position: Sitting, Cuff Size: Normal)    Pulse (!) 50    Temp (!) 97.5 F (36.4 C) (Temporal)    Ht 5\' 1"  (1.549 m)    Wt 102 lb 9.6 oz (46.5 kg)    SpO2 98%    BMI 19.39 kg/m  Wt Readings from Last 3 Encounters:  05/13/21 102 lb 9.6 oz (46.5 kg)  04/21/21 105 lb 2.6 oz (47.7 kg)  04/11/21 105 lb 3.2 oz (47.7 kg)    Physical Exam Vitals reviewed.  Constitutional:      General: She is not in acute distress.    Appearance: Normal appearance.  HENT:     Head: Normocephalic and atraumatic.     Right Ear: External ear normal.     Left Ear: External ear normal.  Eyes:     General: No scleral icterus.       Right eye: No discharge.        Left eye: No discharge.     Conjunctiva/sclera: Conjunctivae normal.  Neck:     Thyroid: No thyromegaly.  Cardiovascular:     Rate and Rhythm: Normal rate and regular rhythm.  Pulmonary:     Effort: No respiratory distress.     Breath sounds: Normal breath sounds. No wheezing.  Abdominal:     General: Bowel sounds are normal.     Palpations: Abdomen is soft.     Tenderness: There is no abdominal tenderness.  Musculoskeletal:        General: No swelling or tenderness.     Cervical back: Neck supple. No tenderness.  Lymphadenopathy:     Cervical: No cervical adenopathy.  Skin:    Findings: No erythema or rash.  Neurological:     Mental Status: She is alert.  Psychiatric:        Mood and Affect: Mood normal.        Behavior: Behavior normal.     Outpatient Encounter Medications as of 05/13/2021  Medication Sig   celecoxib (CELEBREX) 100 MG capsule Take 100 mg by mouth 2 (two) times daily.   Cholecalciferol (VITAMIN D) 2000 UNITS tablet  Take 2,000 Units by mouth daily.   cyanocobalamin 1000 MCG tablet Take 1,000 mcg by mouth daily.   DILT-XR 240 MG 24 hr capsule TAKE 1 CAPSULE DAILY   dorzolamide-timolol (COSOPT) 22.3-6.8 MG/ML ophthalmic  solution INSTILL 1 DROP INTO EACH EYE TWICE DAILY   fluticasone (FLONASE) 50 MCG/ACT nasal spray USE TWO SPRAY(S) IN EACH NOSTRIL ONCE DAILY   InFLIXimab (REMICADE IV) Inject into the vein every 6 (six) weeks. Per Dr Jefm Bryant   latanoprost (XALATAN) 0.005 % ophthalmic solution Place 1 drop into both eyes at bedtime.   losartan (COZAAR) 100 MG tablet TAKE 1 TABLET DAILY   lovastatin (MEVACOR) 40 MG tablet TAKE 1 TABLET DAILY   methotrexate 25 MG/ML SOLN Inject 17.5 mg into the skin once a week. Patient takes 17.5 mg (0.7 ml) on Monday or Tuesday of each week.   mupirocin ointment (BACTROBAN) 2 % Apply 1 application topically 2 (two) times daily.   omeprazole (PRILOSEC) 20 MG capsule TAKE 1 CAPSULE DAILY   phenazopyridine (PYRIDIUM) 200 MG tablet Take 1 tablet (200 mg total) by mouth 3 (three) times daily.   polyethylene glycol (MIRALAX / GLYCOLAX) packet Take 17 g by mouth daily.   Probiotic Product (ALIGN) 4 MG CAPS Take one capsule daily   psyllium (METAMUCIL) 58.6 % packet Take 1 packet by mouth daily.   triamcinolone cream (KENALOG) 0.1 % Apply 1 application topically 2 (two) times daily.   [DISCONTINUED] gabapentin (NEURONTIN) 300 MG capsule Take 1 capsule (300 mg total) by mouth at bedtime.   [DISCONTINUED] spironolactone (ALDACTONE) 25 MG tablet Take 1 tablet (25 mg total) by mouth daily.   gabapentin (NEURONTIN) 300 MG capsule Take 1 capsule (300 mg total) by mouth at bedtime.   spironolactone (ALDACTONE) 25 MG tablet Take 1 tablet (25 mg total) by mouth daily.   No facility-administered encounter medications on file as of 05/13/2021.     Lab Results  Component Value Date   WBC 7.9 03/17/2021   HGB 13.1 03/17/2021   HCT 39.4 03/17/2021   PLT 248 03/17/2021   GLUCOSE 95  05/13/2021   CHOL 182 03/14/2021   TRIG 102.0 03/14/2021   HDL 72.90 03/14/2021   LDLCALC 89 03/14/2021   ALT 12 05/13/2021   AST 22 05/13/2021   NA 127 (L) 05/14/2021   K 4.2 05/13/2021   CL 92 (L) 05/13/2021   CREATININE 1.09 05/13/2021   BUN 27 (H) 05/13/2021   CO2 26 05/13/2021   TSH 1.74 04/17/2020   INR 1.0 12/03/2020    MR Brain Wo Contrast  Result Date: 04/23/2021 CLINICAL DATA:  Provided history: Dizziness, nonspecific. Additional history provided by scanning technologist: Syncopal episode 3-4 weeks ago. EXAM: MRI HEAD WITHOUT CONTRAST TECHNIQUE: Multiplanar, multiecho pulse sequences of the brain and surrounding structures were obtained without intravenous contrast. COMPARISON:  No pertinent prior exams available for comparison. FINDINGS: Brain: Mild generalized cerebral and cerebellar atrophy. Minimal multifocal T2 FLAIR hyperintense signal abnormality within the cerebral white matter, nonspecific but compatible chronic small vessel ischemic disease. 0.9 x 1.0 x 1.3 cm dural-based mass along the anterior falx, likely reflecting a meningioma (series 6, image 12) (series 7, image 14). No significant mass effect upon the underlying frontal lobes. No adjacent parenchymal edema. There is no acute infarct. No chronic intracranial blood products. No extra-axial fluid collection. No midline shift. Vascular: Maintained flow voids within the proximal large arterial vessels. Skull and upper cervical spine: No focal suspicious marrow lesion. Sinuses/Orbits: Visualized orbits show no acute finding. The left maxillary sinus is asymmetrically small. Minimal mucosal thickening within the bilateral maxillary sinuses. Trace mucosal thickening within the right ethmoid sinuses. IMPRESSION: 1.3 cm dural-based mass along the anterior falx, likely reflecting a meningioma. No significant  mass effect upon the adjacent frontal lobes. No adjacent parenchymal edema. Minimal chronic small-vessel ischemic changes  within the cerebral white matter. Mild generalized parenchymal atrophy. Mild paranasal sinus disease, as described. Electronically Signed   By: Kellie Simmering D.O.   On: 04/23/2021 12:11       Assessment & Plan:   Problem List Items Addressed This Visit     Anemia    Follow cbc.       Aortic atherosclerosis (HCC)    Continue lovastatin.      Relevant Medications   spironolactone (ALDACTONE) 25 MG tablet   Essential hypertension - Primary    On losartan and diltiazem.  Now on aldactone.  Blood pressure has improved.  Check metabolic panel.  Follow pressures.       Relevant Medications   spironolactone (ALDACTONE) 25 MG tablet   Other Relevant Orders   Basic metabolic panel (Completed)   GERD (gastroesophageal reflux disease)    No upper symptoms reported.  Continue Prilosec.      Hypercholesterolemia    Continue lovastatin.  Follow lipid panel and liver function tests.       Relevant Medications   spironolactone (ALDACTONE) 25 MG tablet   Other Relevant Orders   Hepatic function panel (Completed)   Hyponatremia    Stopped triam/hctz previously.  On spironolactone.  Recheck metabolic panel.       Relevant Orders   Basic metabolic panel (Completed)   Sodium (Completed)   Loss of weight    Has lost some weight from previous visit.  Encourage increased po intake.  Follow.        MGUS (monoclonal gammopathy of unknown significance)    Has declined f/u with hematology.  Check myeloma panel.       Rheumatoid arthritis ([place])    Followed by rheumatology.  Has been receiving Remicade and MTX.       Stress    Discussed increased stress.  She has good support.  Does not feel needs any further intervention.  Follow.         Einar Pheasant, MD

## 2021-05-14 ENCOUNTER — Other Ambulatory Visit (INDEPENDENT_AMBULATORY_CARE_PROVIDER_SITE_OTHER): Payer: Medicare Other

## 2021-05-14 ENCOUNTER — Telehealth: Payer: Self-pay | Admitting: Internal Medicine

## 2021-05-14 DIAGNOSIS — E871 Hypo-osmolality and hyponatremia: Secondary | ICD-10-CM

## 2021-05-14 LAB — SODIUM: Sodium: 127 mEq/L — ABNORMAL LOW (ref 135–145)

## 2021-05-14 NOTE — Telephone Encounter (Signed)
Letter for sodium order typed and printed.

## 2021-05-16 DIAGNOSIS — M05712 Rheumatoid arthritis with rheumatoid factor of left shoulder without organ or systems involvement: Secondary | ICD-10-CM | POA: Diagnosis not present

## 2021-05-18 ENCOUNTER — Encounter: Payer: Self-pay | Admitting: Internal Medicine

## 2021-05-18 NOTE — Assessment & Plan Note (Signed)
Continue lovastatin.  Follow lipid panel and liver function tests.   

## 2021-05-18 NOTE — Assessment & Plan Note (Signed)
Followed by rheumatology.  Has been receiving Remicade and MTX.  

## 2021-05-18 NOTE — Assessment & Plan Note (Signed)
Has lost some weight from previous visit.  Encourage increased po intake.  Follow.

## 2021-05-18 NOTE — Assessment & Plan Note (Signed)
No upper symptoms reported.  Continue Prilosec. 

## 2021-05-18 NOTE — Assessment & Plan Note (Signed)
Has declined f/u with hematology.  Check myeloma panel.

## 2021-05-18 NOTE — Assessment & Plan Note (Signed)
Continue lovastatin 

## 2021-05-18 NOTE — Assessment & Plan Note (Signed)
On losartan and diltiazem.  Now on aldactone.  Blood pressure has improved.  Check metabolic panel.  Follow pressures.

## 2021-05-18 NOTE — Assessment & Plan Note (Signed)
Stopped triam/hctz previously.  On spironolactone.  Recheck metabolic panel.

## 2021-05-18 NOTE — Assessment & Plan Note (Signed)
Follow cbc.  

## 2021-05-18 NOTE — Assessment & Plan Note (Signed)
Discussed increased stress.  She has good support.  Does not feel needs any further intervention.  Follow.

## 2021-05-20 ENCOUNTER — Other Ambulatory Visit: Payer: Self-pay

## 2021-05-20 ENCOUNTER — Encounter: Payer: Self-pay | Admitting: Family

## 2021-05-20 ENCOUNTER — Telehealth: Payer: Self-pay

## 2021-05-20 DIAGNOSIS — E871 Hypo-osmolality and hyponatremia: Secondary | ICD-10-CM

## 2021-05-20 NOTE — Telephone Encounter (Signed)
Notify Ms Figge that her sodium is 129.  (It is gradually improving - previous readings 125, 127).  Continue fluid restriction as she is doing.  Given her persistent issues with low sodium, I would like to refer her to nephrology.  Also, will need to recheck sodium level within one week to confirm continuing to improve.  Per note, I think she received her infusion last week.  Did the rash start after this? Any itching?  Would need to let Dr Jefm Bryant know if any concern regarding medication given.  Agree, will need to be evaluated if any acute issues.

## 2021-05-20 NOTE — Telephone Encounter (Signed)
I called patient & gave he results of recent sodium. She is scheduled to recheck BMP this Friday & I have ordered. She said that rash appeared yesterday morning & that it was not itchy at all. It did appear after injection but not immediatly.

## 2021-05-20 NOTE — Progress Notes (Signed)
Pt stated in Saturday that she developed a rash that looks to her like the measles or roseola. She stated she has had 4 kids & rash looks the same. Rash is on trunk of body as well as legs. No pain or itching, just some heat/warmth last night. Pt advised that if she has worsening symptoms before this visit she needed to be seen at [facility] & to call us.

## 2021-05-21 ENCOUNTER — Encounter: Payer: Self-pay | Admitting: Family

## 2021-05-21 ENCOUNTER — Ambulatory Visit (INDEPENDENT_AMBULATORY_CARE_PROVIDER_SITE_OTHER): Payer: Medicare Other | Admitting: Family

## 2021-05-21 VITALS — Ht 61.0 in

## 2021-05-21 DIAGNOSIS — L249 Irritant contact dermatitis, unspecified cause: Secondary | ICD-10-CM

## 2021-05-21 NOTE — Telephone Encounter (Signed)
Per note, pt doing ok.  Has appt to have rash evaluated today.

## 2021-05-21 NOTE — Progress Notes (Signed)
Virtual Visit via Telephone Note  I connected with Stacey Snyder on 05/21/21 at  1:30 PM EST by telephone and verified that I am speaking with the correct person using two identifiers.  Location: Patient: Home Provider: [place]   I discussed the limitations, risks, security and privacy concerns of performing an evaluation and management service by telephone and the availability of in person appointments. I also discussed with the patient that there may be a patient responsible charge related to this service. The patient expressed understanding and agreed to proceed.   History of Present Illness: 88 year old female patient presents via telephone with concerns of a rash that started on her torso, neck, back. She reports feeling itchy and having red bumps so she was concerned about measles. She reports using a new vitamin (olive oil). She has since stopped the vitamin and it has helped. She feels like she will be better by Friday and does not want to take any medications. She wanted to be reassured that this was not measles.     Observations/Objective: A&O,  NAD   Assessment and Plan: Stacey Snyder was seen today for rash.  Diagnoses and all orders for this visit:  Irritant contact dermatitis, unspecified trigger    Call the office if symptoms worsen or persist. Recheck Friday if necessary. Advised Claritin or Zyrtec as needed for itching.   Follow Up Instructions:    I discussed the assessment and treatment plan with the patient. The patient was provided an opportunity to ask questions and all were answered. The patient agreed with the plan and demonstrated an understanding of the instructions.   The patient was advised to call back or seek an in-person evaluation if the symptoms worsen or if the condition fails to improve as anticipated.  I provided 20 minutes of non-face-to-face time during this encounter.   Kennyth Arnold, FNP

## 2021-05-24 ENCOUNTER — Other Ambulatory Visit (INDEPENDENT_AMBULATORY_CARE_PROVIDER_SITE_OTHER): Payer: Medicare Other

## 2021-05-24 ENCOUNTER — Other Ambulatory Visit: Payer: Self-pay

## 2021-05-24 DIAGNOSIS — E871 Hypo-osmolality and hyponatremia: Secondary | ICD-10-CM

## 2021-05-24 LAB — BASIC METABOLIC PANEL
BUN: 46 mg/dL — ABNORMAL HIGH (ref 6–23)
CO2: 29 mEq/L (ref 19–32)
Calcium: 9.3 mg/dL (ref 8.4–10.5)
Chloride: 95 mEq/L — ABNORMAL LOW (ref 96–112)
Creatinine, Ser: 1.37 mg/dL — ABNORMAL HIGH (ref 0.40–1.20)
GFR: 35.06 mL/min — ABNORMAL LOW (ref >60.00)
Glucose, Bld: 93 mg/dL (ref 70–99)
Potassium: 4.6 mEq/L (ref 3.5–5.1)
Sodium: 129 mEq/L — ABNORMAL LOW (ref 135–145)

## 2021-05-24 NOTE — Addendum Note (Signed)
Addended by: Lars Masson on: 05/24/2021 04:30 PM   Modules accepted: Orders

## 2021-05-29 ENCOUNTER — Other Ambulatory Visit: Payer: Medicare Other

## 2021-05-30 ENCOUNTER — Other Ambulatory Visit (INDEPENDENT_AMBULATORY_CARE_PROVIDER_SITE_OTHER): Payer: Medicare Other

## 2021-05-30 ENCOUNTER — Other Ambulatory Visit: Payer: Self-pay

## 2021-05-30 DIAGNOSIS — E871 Hypo-osmolality and hyponatremia: Secondary | ICD-10-CM

## 2021-05-30 LAB — BASIC METABOLIC PANEL
BUN: 27 mg/dL — ABNORMAL HIGH (ref 6–23)
CO2: 27 mEq/L (ref 19–32)
Calcium: 9.7 mg/dL (ref 8.4–10.5)
Chloride: 95 mEq/L — ABNORMAL LOW (ref 96–112)
Creatinine, Ser: 1.14 mg/dL (ref 0.40–1.20)
GFR: 43.7 mL/min — ABNORMAL LOW (ref >60.00)
Glucose, Bld: 124 mg/dL — ABNORMAL HIGH (ref 70–99)
Potassium: 4.2 mEq/L (ref 3.5–5.1)
Sodium: 129 mEq/L — ABNORMAL LOW (ref 135–145)

## 2021-06-09 ENCOUNTER — Other Ambulatory Visit: Payer: Self-pay | Admitting: Internal Medicine

## 2021-06-18 ENCOUNTER — Other Ambulatory Visit: Payer: Self-pay

## 2021-06-18 ENCOUNTER — Ambulatory Visit (INDEPENDENT_AMBULATORY_CARE_PROVIDER_SITE_OTHER): Payer: Medicare Other | Admitting: Internal Medicine

## 2021-06-18 VITALS — BP 124/66 | HR 67 | Temp 97.9°F | Resp 16 | Ht 61.0 in | Wt 104.0 lb

## 2021-06-18 DIAGNOSIS — F439 Reaction to severe stress, unspecified: Secondary | ICD-10-CM | POA: Diagnosis not present

## 2021-06-18 DIAGNOSIS — I700 Atherosclerosis of aorta: Secondary | ICD-10-CM | POA: Diagnosis not present

## 2021-06-18 DIAGNOSIS — E785 Hyperlipidemia, unspecified: Secondary | ICD-10-CM

## 2021-06-18 DIAGNOSIS — L989 Disorder of the skin and subcutaneous tissue, unspecified: Secondary | ICD-10-CM

## 2021-06-18 DIAGNOSIS — K219 Gastro-esophageal reflux disease without esophagitis: Secondary | ICD-10-CM

## 2021-06-18 DIAGNOSIS — I10 Essential (primary) hypertension: Secondary | ICD-10-CM

## 2021-06-18 DIAGNOSIS — E7800 Pure hypercholesterolemia, unspecified: Secondary | ICD-10-CM | POA: Diagnosis not present

## 2021-06-18 DIAGNOSIS — D649 Anemia, unspecified: Secondary | ICD-10-CM | POA: Diagnosis not present

## 2021-06-18 DIAGNOSIS — K862 Cyst of pancreas: Secondary | ICD-10-CM | POA: Diagnosis not present

## 2021-06-18 DIAGNOSIS — Z0000 Encounter for general adult medical examination without abnormal findings: Secondary | ICD-10-CM

## 2021-06-18 DIAGNOSIS — M069 Rheumatoid arthritis, unspecified: Secondary | ICD-10-CM

## 2021-06-18 DIAGNOSIS — E871 Hypo-osmolality and hyponatremia: Secondary | ICD-10-CM | POA: Diagnosis not present

## 2021-06-18 DIAGNOSIS — R634 Abnormal weight loss: Secondary | ICD-10-CM

## 2021-06-18 LAB — CBC WITH DIFFERENTIAL/PLATELET
Basophils Absolute: 0 10*3/uL (ref 0.0–0.1)
Basophils Relative: 0.7 % (ref 0.0–3.0)
Eosinophils Absolute: 0.3 10*3/uL (ref 0.0–0.7)
Eosinophils Relative: 5.1 % — ABNORMAL HIGH (ref 0.0–5.0)
HCT: 34.9 % — ABNORMAL LOW (ref 36.0–46.0)
Hemoglobin: 11.7 g/dL — ABNORMAL LOW (ref 12.0–15.0)
Lymphocytes Relative: 39.7 % (ref 12.0–46.0)
Lymphs Abs: 2.5 10*3/uL (ref 0.7–4.0)
MCHC: 33.4 g/dL (ref 30.0–36.0)
MCV: 92.5 fl (ref 78.0–100.0)
Monocytes Absolute: 0.7 10*3/uL (ref 0.1–1.0)
Monocytes Relative: 11.7 % (ref 3.0–12.0)
Neutro Abs: 2.7 10*3/uL (ref 1.4–7.7)
Neutrophils Relative %: 42.8 % — ABNORMAL LOW (ref 43.0–77.0)
Platelets: 283 10*3/uL (ref 150.0–400.0)
RBC: 3.77 Mil/uL — ABNORMAL LOW (ref 3.87–5.11)
RDW: 14.5 % (ref 11.5–15.5)
WBC: 6.4 10*3/uL (ref 4.0–10.5)

## 2021-06-18 LAB — TSH: TSH: 1.71 u[IU]/mL (ref 0.35–5.50)

## 2021-06-18 MED ORDER — LOVASTATIN 40 MG PO TABS: 40.0000 mg | ORAL_TABLET | Freq: Every day | ORAL | 1 refills | Status: DC

## 2021-06-18 MED ORDER — DILTIAZEM HCL ER 240 MG PO CP24: 240.0000 mg | ORAL_CAPSULE | Freq: Every day | ORAL | 1 refills | Status: DC

## 2021-06-18 NOTE — Assessment & Plan Note (Signed)
Physical today 06/18/21.  Declines mammogram.  Colonoscopy 12/05/20 - diverticulosis, internal hemorrhoids.

## 2021-06-18 NOTE — Progress Notes (Signed)
Patient ID: Stacey Snyder, female   DOB: Sep 27, 1932, 88 y.o.   MRN: [id number]   Subjective:    Patient ID: Stacey Snyder, female    DOB: 20-Jan-1933, 88 y.o.   MRN: [id number]  This visit occurred during the SARS-CoV-2 public health emergency.  Safety protocols were in place, including screening questions prior to the visit, additional usage of staff PPE, and extensive cleaning of exam room while observing appropriate contact time as indicated for disinfecting solutions.    HPI With past history of hyponatremia, hypertension, IBS and reflux.  Here to today to follow up on these issues as well as for a complete physical exam.  Reports she feels better.  Eating better. No dizziness.  No headache.  No chest pain or sob reported.  No nausea or vomiting.  No abdominal pain.  Bowels doing well.  UTI resolved.  Persistent arm lesion.  Request referral to dermatology.    Past Medical History:  Diagnosis Date   Diverticulosis    Fibrocystic breast disease    GERD (gastroesophageal reflux disease)    Glaucoma 12/04/2020   History of kidney stones    Hypercholesterolemia    Hypertension    Hypertension    IBS (irritable bowel syndrome)    Inflammatory arthritis    positive anti-CCP abs, s/p prednisone, MTX, Remicade   Nephrolithiasis    Osteopenia    GI upset with Fosamax   Pancreatitis 2007   s/p ERCP   Pancreatitis    PONV (postoperative nausea and vomiting)    Renal insufficiency    Spinal stenosis of lumbosacral region    Past Surgical History:  Procedure Laterality Date   ABDOMINAL HYSTERECTOMY  1993   ovaries not removed   BREAST BIOPSY Left 1984   negative   BREAST CYST EXCISION Left 2010   negative   COLONOSCOPY N/A 12/05/2020   Procedure: COLONOSCOPY;  Surgeon: Lesly Rubenstein, MD;  Location: [facility] ENDOSCOPY;  Service: Endoscopy;  Laterality: N/A;   LITHOTRIPSY     LUMBAR LAMINECTOMY  1983   LUMBAR LAMINECTOMY/DECOMPRESSION MICRODISCECTOMY N/A 08/18/2016    Procedure: LUMBAR LAMINECTOMY/DECOMPRESSION MICRODISCECTOMY 1 LEVEL;  Surgeon: Bayard Hugger, MD;  Location: [facility] ORS;  Service: Neurosurgery;  Laterality: N/A;  L4-5 Laminectomy, MIS, L5 foraminotomy   TRIGGER FINGER RELEASE     right ring finger   Family History  Problem Relation Age of Onset   Ovarian cancer Mother    Renal Disease Mother    Heart disease Mother    Diabetes Mother    Hypertension Mother    Heart disease Father    Breast cancer Neg Hx    Social History   Socioeconomic History   Marital status: Married    Spouse name: Not on file   Number of children: 4   Years of education: Not on file   Highest education level: Not on file  Occupational History   Not on file  Tobacco Use   Smoking status: Never   Smokeless tobacco: Never  Vaping Use   Vaping Use: Never used  Substance and Sexual Activity   Alcohol use: No    Alcohol/week: 0.0 standard drinks   Drug use: No   Sexual activity: Not on file  Other Topics Concern   Not on file  Social History Narrative   Not on file   Social Determinants of Health   Financial Resource Strain: Not on file  Food Insecurity: Not on file  Transportation Needs: Not on file  Physical  Activity: Not on file  Stress: Not on file  Social Connections: Not on file     Review of Systems  Constitutional:  Negative for appetite change and unexpected weight change.  HENT:  Negative for congestion, sinus pressure and sore throat.   Eyes:  Negative for pain and visual disturbance.  Respiratory:  Negative for cough, chest tightness and shortness of breath.   Cardiovascular:  Negative for chest pain, palpitations and leg swelling.  Gastrointestinal:  Negative for abdominal pain, diarrhea, nausea and vomiting.  Genitourinary:  Negative for difficulty urinating and dysuria.  Musculoskeletal:  Negative for joint swelling and myalgias.  Skin:  Negative for color change and rash.  Neurological:  Negative for dizziness,  light-headedness and headaches.  Hematological:  Negative for adenopathy. Does not bruise/bleed easily.  Psychiatric/Behavioral:  Negative for agitation and dysphoric mood.       Objective:     BP 124/66    Pulse 67    Temp 97.9 F (36.6 C)    Resp 16    Ht _0  (1.549 m)    Wt 104 lb (47.2 kg)    SpO2 98%    BMI 19.65 kg/m  Wt Readings from Last 3 Encounters:  06/18/21 104 lb (47.2 kg)  05/13/21 102 lb 9.6 oz (46.5 kg)  04/21/21 105 lb 2.6 oz (47.7 kg)    Physical Exam Vitals reviewed.  Constitutional:      General: She is not in acute distress.    Appearance: Normal appearance. She is well-developed.  HENT:     Head: Normocephalic and atraumatic.     Right Ear: External ear normal.     Left Ear: External ear normal.  Eyes:     General: No scleral icterus.       Right eye: No discharge.        Left eye: No discharge.     Conjunctiva/sclera: Conjunctivae normal.  Neck:     Thyroid: No thyromegaly.  Cardiovascular:     Rate and Rhythm: Normal rate and regular rhythm.  Pulmonary:     Effort: No tachypnea, accessory muscle usage or respiratory distress.     Breath sounds: Normal breath sounds. No decreased breath sounds or wheezing.  Chest:  Breasts:    Right: No inverted nipple, mass, nipple discharge or tenderness (no axillary adenopathy).     Left: No inverted nipple, mass, nipple discharge or tenderness (no axilarry adenopathy).  Abdominal:     General: Bowel sounds are normal.     Palpations: Abdomen is soft.     Tenderness: There is no abdominal tenderness.  Musculoskeletal:        General: No swelling or tenderness.     Cervical back: Neck supple.  Lymphadenopathy:     Cervical: No cervical adenopathy.  Skin:    Findings: No erythema or rash.  Neurological:     Mental Status: She is alert and oriented to person, place, and time.  Psychiatric:        Mood and Affect: Mood normal.        Behavior: Behavior normal.     Outpatient Encounter Medications as  of 06/18/2021  Medication Sig   celecoxib (CELEBREX) 100 MG capsule Take 100 mg by mouth 2 (two) times daily.   Cholecalciferol (VITAMIN D) 2000 UNITS tablet Take 2,000 Units by mouth daily.   cyanocobalamin 1000 MCG tablet Take 1,000 mcg by mouth daily.   diltiazem (DILT-XR) 240 MG 24 hr capsule Take 1 capsule (240 mg total)  by mouth daily.   dorzolamide-timolol (COSOPT) 22.3-6.8 MG/ML ophthalmic solution INSTILL 1 DROP INTO EACH EYE TWICE DAILY   fluticasone (FLONASE) 50 MCG/ACT nasal spray USE TWO SPRAY(S) IN EACH NOSTRIL ONCE DAILY   gabapentin (NEURONTIN) 300 MG capsule Take 1 capsule (300 mg total) by mouth at bedtime.   InFLIXimab (REMICADE IV) Inject into the vein every 6 (six) weeks. Per Dr Jefm Bryant   latanoprost (XALATAN) 0.005 % ophthalmic solution Place 1 drop into both eyes at bedtime.   losartan (COZAAR) 100 MG tablet TAKE 1 TABLET DAILY   lovastatin (MEVACOR) 40 MG tablet Take 1 tablet (40 mg total) by mouth daily.   methotrexate 25 MG/ML SOLN Inject 17.5 mg into the skin once a week. Patient takes 17.5 mg (0.7 ml) on Monday or Tuesday of each week.   mupirocin ointment (BACTROBAN) 2 % Apply 1 application topically 2 (two) times daily.   omeprazole (PRILOSEC) 20 MG capsule TAKE 1 CAPSULE DAILY   phenazopyridine (PYRIDIUM) 200 MG tablet Take 1 tablet (200 mg total) by mouth 3 (three) times daily.   polyethylene glycol (MIRALAX / GLYCOLAX) packet Take 17 g by mouth daily.   Probiotic Product (ALIGN) 4 MG CAPS Take one capsule daily   psyllium (METAMUCIL) 58.6 % packet Take 1 packet by mouth daily.   triamcinolone cream (KENALOG) 0.1 % Apply 1 application topically 2 (two) times daily.   [DISCONTINUED] DILT-XR 240 MG 24 hr capsule TAKE 1 CAPSULE DAILY   [DISCONTINUED] lovastatin (MEVACOR) 40 MG tablet TAKE 1 TABLET DAILY   [DISCONTINUED] spironolactone (ALDACTONE) 25 MG tablet TAKE 1 TABLET(25 MG) BY MOUTH DAILY   No facility-administered encounter medications on file as of  06/18/2021.     Lab Results  Component Value Date   WBC 6.4 06/18/2021   HGB 11.7 (L) 06/18/2021   HCT 34.9 (L) 06/18/2021   PLT 283.0 06/18/2021   GLUCOSE 91 06/18/2021   CHOL 182 03/14/2021   TRIG 102.0 03/14/2021   HDL 72.90 03/14/2021   LDLCALC 89 03/14/2021   ALT 12 06/18/2021   AST 21 06/18/2021   NA 134 (L) 06/18/2021   K 4.2 06/18/2021   CL 97 06/18/2021   CREATININE 0.99 06/18/2021   BUN 29 (H) 06/18/2021   CO2 27 06/18/2021   TSH 1.71 06/18/2021   INR 1.0 12/03/2020    MR Brain Wo Contrast  Result Date: 04/23/2021 CLINICAL DATA:  Provided history: Dizziness, nonspecific. Additional history provided by scanning technologist: Syncopal episode 3-4 weeks ago. EXAM: MRI HEAD WITHOUT CONTRAST TECHNIQUE: Multiplanar, multiecho pulse sequences of the brain and surrounding structures were obtained without intravenous contrast. COMPARISON:  No pertinent prior exams available for comparison. FINDINGS: Brain: Mild generalized cerebral and cerebellar atrophy. Minimal multifocal T2 FLAIR hyperintense signal abnormality within the cerebral white matter, nonspecific but compatible chronic small vessel ischemic disease. 0.9 x 1.0 x 1.3 cm dural-based mass along the anterior falx, likely reflecting a meningioma (series 6, image 12) (series 7, image 14). No significant mass effect upon the underlying frontal lobes. No adjacent parenchymal edema. There is no acute infarct. No chronic intracranial blood products. No extra-axial fluid collection. No midline shift. Vascular: Maintained flow voids within the proximal large arterial vessels. Skull and upper cervical spine: No focal suspicious marrow lesion. Sinuses/Orbits: Visualized orbits show no acute finding. The left maxillary sinus is asymmetrically small. Minimal mucosal thickening within the bilateral maxillary sinuses. Trace mucosal thickening within the right ethmoid sinuses. IMPRESSION: 1.3 cm dural-based mass along the anterior falx, likely  reflecting a meningioma. No significant mass effect upon the adjacent frontal lobes. No adjacent parenchymal edema. Minimal chronic small-vessel ischemic changes within the cerebral white matter. Mild generalized parenchymal atrophy. Mild paranasal sinus disease, as described. Electronically Signed   By: Kellie Simmering D.O.   On: 04/23/2021 12:11       Assessment & Plan:   Problem List Items Addressed This Visit     Anemia    Follow cbc.       Aortic atherosclerosis (HCC)    Continue lovastatin.      Relevant Medications   diltiazem (DILT-XR) 240 MG 24 hr capsule   lovastatin (MEVACOR) 40 MG tablet   Arm lesion    Persistent arm lesion.  Refer to dermatology for further evaluation.        Relevant Orders   Ambulatory referral to Dermatology   Essential hypertension    On losartan and diltiazem.  Off aldactone.  Blood pressure is doing well.  Feels better. Check metabolic panel.  Follow pressures.       Relevant Medications   diltiazem (DILT-XR) 240 MG 24 hr capsule   lovastatin (MEVACOR) 40 MG tablet   GERD (gastroesophageal reflux disease)    No upper symptoms reported.  Continue Prilosec.      Health care maintenance    Physical today 06/18/21.  Declines mammogram.  Colonoscopy 12/05/20 - diverticulosis, internal hemorrhoids.       Hypercholesterolemia    Continue lovastatin.  Follow lipid panel and liver function tests.       Relevant Medications   diltiazem (DILT-XR) 240 MG 24 hr capsule   lovastatin (MEVACOR) 40 MG tablet   Other Relevant Orders   CBC with Differential/Platelet (Completed)   Hepatic function panel (Completed)   Hyponatremia - Primary    Off aldactone and triam/hctz.  Feeling better.  Recheck met b today.       Relevant Orders   Basic metabolic panel (Completed)   TSH (Completed)   Loss of weight    Weight up a couple of pounds from last check.  Follow.  Feels better.  Eating.       Pancreas cyst    Evaluated by GI 09/10/2020-pancreatic cyst  seen on CT-plan MRI follow-up in 1 year      Rheumatoid arthritis ([place])    Followed by rheumatology.  Has been receiving Remicade and MTX.       Stress    Discussed increased stress.  She has good support.  Does not feel needs any further intervention.  Follow.       Other Visit Diagnoses     Hyperlipidemia, unspecified hyperlipidemia type       Relevant Medications   diltiazem (DILT-XR) 240 MG 24 hr capsule   lovastatin (MEVACOR) 40 MG tablet        Einar Pheasant, MD

## 2021-06-19 LAB — BASIC METABOLIC PANEL
BUN: 29 mg/dL — ABNORMAL HIGH (ref 6–23)
CO2: 27 mEq/L (ref 19–32)
Calcium: 9.9 mg/dL (ref 8.4–10.5)
Chloride: 97 mEq/L (ref 96–112)
Creatinine, Ser: 0.99 mg/dL (ref 0.40–1.20)
GFR: 51.75 mL/min — ABNORMAL LOW (ref >60.00)
Glucose, Bld: 91 mg/dL (ref 70–99)
Potassium: 4.2 mEq/L (ref 3.5–5.1)
Sodium: 134 mEq/L — ABNORMAL LOW (ref 135–145)

## 2021-06-19 LAB — HEPATIC FUNCTION PANEL
ALT: 12 U/L (ref 0–35)
AST: 21 U/L (ref 0–37)
Albumin: 4.4 g/dL (ref 3.5–5.2)
Alkaline Phosphatase: 80 U/L (ref 39–117)
Bilirubin, Direct: 0.1 mg/dL (ref 0.0–0.3)
Total Bilirubin: 0.4 mg/dL (ref 0.2–1.2)
Total Protein: 7.8 g/dL (ref 6.0–8.3)

## 2021-06-20 ENCOUNTER — Other Ambulatory Visit: Payer: Self-pay | Admitting: Internal Medicine

## 2021-06-20 NOTE — Telephone Encounter (Signed)
The original prescription was discontinued on 04/11/2021 by Einar Pheasant, MD. Renewing this prescription may not be appropriate.

## 2021-06-20 NOTE — Telephone Encounter (Signed)
She should be off hydralazine. Please clarify with pt.  Please remove if not taking.

## 2021-06-21 ENCOUNTER — Telehealth: Payer: Self-pay

## 2021-06-21 ENCOUNTER — Telehealth: Payer: Self-pay | Admitting: Internal Medicine

## 2021-06-21 NOTE — Telephone Encounter (Signed)
See phone note.  Previously had discussed stopping this medication.  Need to confirm if off.  If so, notify pharmacy.  If taking, will let me know

## 2021-06-21 NOTE — Telephone Encounter (Signed)
I received a request for hydralazine refill.  We had previously discussed her stopping this medication.  I don't see it on her med list. Please confirm not taking and can d/c at pharmacy.

## 2021-06-21 NOTE — Telephone Encounter (Signed)
Patient is not taking hydralazine. Will d/c at pharmacy

## 2021-06-21 NOTE — Telephone Encounter (Signed)
S/w pt - not takling med any longer Pharm aware

## 2021-06-22 ENCOUNTER — Encounter: Payer: Self-pay | Admitting: Internal Medicine

## 2021-06-22 DIAGNOSIS — L989 Disorder of the skin and subcutaneous tissue, unspecified: Secondary | ICD-10-CM | POA: Insufficient documentation

## 2021-06-22 NOTE — Assessment & Plan Note (Signed)
Continue lovastatin.  Follow lipid panel and liver function tests.   

## 2021-06-22 NOTE — Assessment & Plan Note (Signed)
No upper symptoms reported.  Continue Prilosec. 

## 2021-06-22 NOTE — Assessment & Plan Note (Signed)
Discussed increased stress.  She has good support.  Does not feel needs any further intervention.  Follow.
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
## 2021-06-22 NOTE — Assessment & Plan Note (Signed)
Continue lovastatin 

## 2021-06-22 NOTE — Assessment & Plan Note (Signed)
Follow cbc.  

## 2021-06-22 NOTE — Assessment & Plan Note (Signed)
Persistent arm lesion.  Refer to dermatology for further evaluation.

## 2021-06-22 NOTE — Assessment & Plan Note (Signed)
Followed by rheumatology.  Has been receiving Remicade and MTX.  

## 2021-06-22 NOTE — Assessment & Plan Note (Signed)
On losartan and diltiazem.  Off aldactone.  Blood pressure is doing well.  Feels better. Check metabolic panel.  Follow pressures.

## 2021-06-22 NOTE — Assessment & Plan Note (Signed)
Weight up a couple of pounds from last check.  Follow.  Feels better.  Eating.

## 2021-06-22 NOTE — Assessment & Plan Note (Signed)
Off aldactone and triam/hctz.  Feeling better.  Recheck met b today.  ?

## 2021-07-01 DIAGNOSIS — M05712 Rheumatoid arthritis with rheumatoid factor of left shoulder without organ or systems involvement: Secondary | ICD-10-CM | POA: Diagnosis not present

## 2021-07-06 ENCOUNTER — Other Ambulatory Visit: Payer: Self-pay | Admitting: Internal Medicine

## 2021-07-06 DIAGNOSIS — K219 Gastro-esophageal reflux disease without esophagitis: Secondary | ICD-10-CM

## 2021-07-16 ENCOUNTER — Other Ambulatory Visit: Payer: Self-pay | Admitting: Internal Medicine

## 2021-07-16 DIAGNOSIS — D329 Benign neoplasm of meninges, unspecified: Secondary | ICD-10-CM | POA: Diagnosis not present

## 2021-08-07 ENCOUNTER — Ambulatory Visit (INDEPENDENT_AMBULATORY_CARE_PROVIDER_SITE_OTHER): Payer: Medicare Other | Admitting: Internal Medicine

## 2021-08-07 VITALS — BP 128/70 | HR 60 | Temp 97.9°F | Resp 16 | Ht 60.0 in | Wt 102.0 lb

## 2021-08-07 DIAGNOSIS — D329 Benign neoplasm of meninges, unspecified: Secondary | ICD-10-CM

## 2021-08-07 DIAGNOSIS — K219 Gastro-esophageal reflux disease without esophagitis: Secondary | ICD-10-CM

## 2021-08-07 DIAGNOSIS — I10 Essential (primary) hypertension: Secondary | ICD-10-CM | POA: Diagnosis not present

## 2021-08-07 DIAGNOSIS — E871 Hypo-osmolality and hyponatremia: Secondary | ICD-10-CM | POA: Diagnosis not present

## 2021-08-07 DIAGNOSIS — E7800 Pure hypercholesterolemia, unspecified: Secondary | ICD-10-CM

## 2021-08-07 DIAGNOSIS — F439 Reaction to severe stress, unspecified: Secondary | ICD-10-CM

## 2021-08-07 DIAGNOSIS — Z0000 Encounter for general adult medical examination without abnormal findings: Secondary | ICD-10-CM | POA: Diagnosis not present

## 2021-08-07 DIAGNOSIS — D649 Anemia, unspecified: Secondary | ICD-10-CM

## 2021-08-07 DIAGNOSIS — I700 Atherosclerosis of aorta: Secondary | ICD-10-CM | POA: Diagnosis not present

## 2021-08-07 DIAGNOSIS — K862 Cyst of pancreas: Secondary | ICD-10-CM

## 2021-08-07 DIAGNOSIS — M069 Rheumatoid arthritis, unspecified: Secondary | ICD-10-CM

## 2021-08-07 DIAGNOSIS — D472 Monoclonal gammopathy: Secondary | ICD-10-CM

## 2021-08-07 DIAGNOSIS — M25561 Pain in right knee: Secondary | ICD-10-CM

## 2021-08-07 LAB — HEPATIC FUNCTION PANEL
ALT: 12 U/L (ref 0–35)
AST: 24 U/L (ref 0–37)
Albumin: 4.5 g/dL (ref 3.5–5.2)
Alkaline Phosphatase: 97 U/L (ref 39–117)
Bilirubin, Direct: 0.1 mg/dL (ref 0.0–0.3)
Total Bilirubin: 0.6 mg/dL (ref 0.2–1.2)
Total Protein: 7.4 g/dL (ref 6.0–8.3)

## 2021-08-07 LAB — IBC + FERRITIN
Ferritin: 160.4 ng/mL (ref 10.0–291.0)
Iron: 81 ug/dL (ref 42–145)
Saturation Ratios: 28.1 % (ref 20.0–50.0)
TIBC: 288.4 ug/dL (ref 250.0–450.0)
Transferrin: 206 mg/dL — ABNORMAL LOW (ref 212.0–360.0)

## 2021-08-07 LAB — CBC WITH DIFFERENTIAL/PLATELET
Basophils Absolute: 0.1 10*3/uL (ref 0.0–0.1)
Basophils Relative: 0.9 % (ref 0.0–3.0)
Eosinophils Absolute: 0.2 10*3/uL (ref 0.0–0.7)
Eosinophils Relative: 3.8 % (ref 0.0–5.0)
HCT: 38.9 % (ref 36.0–46.0)
Hemoglobin: 12.7 g/dL (ref 12.0–15.0)
Lymphocytes Relative: 42.8 % (ref 12.0–46.0)
Lymphs Abs: 2.5 10*3/uL (ref 0.7–4.0)
MCHC: 32.7 g/dL (ref 30.0–36.0)
MCV: 94.6 fl (ref 78.0–100.0)
Monocytes Absolute: 0.7 10*3/uL (ref 0.1–1.0)
Monocytes Relative: 11.5 % (ref 3.0–12.0)
Neutro Abs: 2.4 10*3/uL (ref 1.4–7.7)
Neutrophils Relative %: 41 % — ABNORMAL LOW (ref 43.0–77.0)
Platelets: 262 10*3/uL (ref 150.0–400.0)
RBC: 4.11 Mil/uL (ref 3.87–5.11)
RDW: 13.4 % (ref 11.5–15.5)
WBC: 5.8 10*3/uL (ref 4.0–10.5)

## 2021-08-07 LAB — BASIC METABOLIC PANEL
BUN: 22 mg/dL (ref 6–23)
CO2: 30 mEq/L (ref 19–32)
Calcium: 9.9 mg/dL (ref 8.4–10.5)
Chloride: 99 mEq/L (ref 96–112)
Creatinine, Ser: 0.95 mg/dL (ref 0.40–1.20)
GFR: 54.32 mL/min — ABNORMAL LOW (ref >60.00)
Glucose, Bld: 89 mg/dL (ref 70–99)
Potassium: 3.9 mEq/L (ref 3.5–5.1)
Sodium: 137 mEq/L (ref 135–145)

## 2021-08-07 LAB — LIPID PANEL
Cholesterol: 186 mg/dL (ref 0–200)
HDL: 76.7 mg/dL (ref >39.00)
LDL Cholesterol: 92 mg/dL (ref 0–99)
NonHDL: 108.93
Total CHOL/HDL Ratio: 2
Triglycerides: 87 mg/dL (ref 0.0–149.0)
VLDL: 17.4 mg/dL (ref 0.0–40.0)

## 2021-08-07 NOTE — Progress Notes (Signed)
Patient ID: Stacey Snyder, female   DOB: March 12, 1933, 88 y.o.   MRN: [id number] ? ? ?Subjective:  ? ? Patient ID: Stacey Snyder, female    DOB: June 05, 1932, 88 y.o.   MRN: [id number] ? ?This visit occurred during the SARS-CoV-2 public health emergency.  Safety protocols were in place, including screening questions prior to the visit, additional usage of staff PPE, and extensive cleaning of exam room while observing appropriate contact time as indicated for disinfecting solutions.  ? ?Patient here for a scheduled follow up .  ? ?HPI ?Here to follow up regarding her blood pressure and sodium level.  She is staying active.  Working in her yard.  Eating.  No nausea or vomiting.  No abdominal pain.  No bowel change.  Having some right knee pain and occasional swelling.  Sees rheumatology.  Has seen ortho.  S/p injection.  Discussed celebrex.  Questions answered.  Plans to avoid. Desires no further intervention at this time.  Due to get remicade next week.  Handling stress.  ? ? ?Past Medical History:  ?Diagnosis Date  ? Diverticulosis   ? Fibrocystic breast disease   ? GERD (gastroesophageal reflux disease)   ? Glaucoma 12/04/2020  ? History of kidney stones   ? Hypercholesterolemia   ? Hypertension   ? Hypertension   ? IBS (irritable bowel syndrome)   ? Inflammatory arthritis   ? positive anti-CCP abs, s/p prednisone, MTX, Remicade  ? Nephrolithiasis   ? Osteopenia   ? GI upset with Fosamax  ? Pancreatitis 2007  ? s/p ERCP  ? Pancreatitis   ? PONV (postoperative nausea and vomiting)   ? Renal insufficiency   ? Spinal stenosis of lumbosacral region   ? ?Past Surgical History:  ?Procedure Laterality Date  ? ABDOMINAL HYSTERECTOMY  1993  ? ovaries not removed  ? BREAST BIOPSY Left 1984  ? negative  ? BREAST CYST EXCISION Left 2010  ? negative  ? COLONOSCOPY N/A 12/05/2020  ? Procedure: COLONOSCOPY;  Surgeon: Lesly Rubenstein, MD;  Location: [facility] ENDOSCOPY;  Service: Endoscopy;  Laterality: N/A;  ? LITHOTRIPSY     ? LUMBAR LAMINECTOMY  1983  ? LUMBAR LAMINECTOMY/DECOMPRESSION MICRODISCECTOMY N/A 08/18/2016  ? Procedure: LUMBAR LAMINECTOMY/DECOMPRESSION MICRODISCECTOMY 1 LEVEL;  Surgeon: Bayard Hugger, MD;  Location: [facility] ORS;  Service: Neurosurgery;  Laterality: N/A;  L4-5 Laminectomy, MIS, L5 foraminotomy  ? TRIGGER FINGER RELEASE    ? right ring finger  ? ?Family History  ?Problem Relation Age of Onset  ? Ovarian cancer Mother   ? Renal Disease Mother   ? Heart disease Mother   ? Diabetes Mother   ? Hypertension Mother   ? Heart disease Father   ? Breast cancer Neg Hx   ? ?Social History  ? ?Socioeconomic History  ? Marital status: Married  ?  Spouse name: Not on file  ? Number of children: 4  ? Years of education: Not on file  ? Highest education level: Not on file  ?Occupational History  ? Not on file  ?Tobacco Use  ? Smoking status: Never  ? Smokeless tobacco: Never  ?Vaping Use  ? Vaping Use: Never used  ?Substance and Sexual Activity  ? Alcohol use: No  ?  Alcohol/week: 0.0 standard drinks  ? Drug use: No  ? Sexual activity: Not on file  ?Other Topics Concern  ? Not on file  ?Social History Narrative  ? Not on file  ? ?Social Determinants of Health  ? ?Financial  Resource Strain: Not on file  ?Food Insecurity: Not on file  ?Transportation Needs: Not on file  ?Physical Activity: Not on file  ?Stress: Not on file  ?Social Connections: Not on file  ? ? ? ?Review of Systems  ?Constitutional:  Negative for appetite change and unexpected weight change.  ?HENT:  Negative for congestion and sinus pressure.   ?Respiratory:  Negative for cough, chest tightness and shortness of breath.   ?Cardiovascular:  Negative for chest pain, palpitations and leg swelling.  ?Gastrointestinal:  Negative for abdominal pain, diarrhea, nausea and vomiting.  ?Genitourinary:  Negative for difficulty urinating and dysuria.  ?Musculoskeletal:  Negative for myalgias.  ?     Knee pain as outlined.   ?Skin:  Negative for color change and rash.   ?Neurological:  Negative for dizziness, light-headedness and headaches.  ?Psychiatric/Behavioral:  Negative for agitation and dysphoric mood.   ? ?   ?Objective:  ?  ? ?BP 128/70   Pulse 60   Temp 97.9 ?F (36.6 ?C)   Resp 16   Ht 5' (1.524 m)   Wt 102 lb (46.3 kg)   SpO2 98%   BMI 19.92 kg/m?  ?Wt Readings from Last 3 Encounters:  ?08/07/21 102 lb (46.3 kg)  ?06/18/21 104 lb (47.2 kg)  ?05/13/21 102 lb 9.6 oz (46.5 kg)  ? ? ?Physical Exam ?Vitals reviewed.  ?Constitutional:   ?   General: She is not in acute distress. ?   Appearance: Normal appearance.  ?HENT:  ?   Head: Normocephalic and atraumatic.  ?   Right Ear: External ear normal.  ?   Left Ear: External ear normal.  ?Eyes:  ?   General: No scleral icterus.    ?   Right eye: No discharge.     ?   Left eye: No discharge.  ?   Conjunctiva/sclera: Conjunctivae normal.  ?Neck:  ?   Thyroid: No thyromegaly.  ?Cardiovascular:  ?   Rate and Rhythm: Normal rate and regular rhythm.  ?Pulmonary:  ?   Effort: No respiratory distress.  ?   Breath sounds: Normal breath sounds. No wheezing.  ?Abdominal:  ?   General: Bowel sounds are normal.  ?   Palpations: Abdomen is soft.  ?   Tenderness: There is no abdominal tenderness.  ?Musculoskeletal:     ?   General: No swelling or tenderness.  ?   Cervical back: Neck supple. No tenderness.  ?Lymphadenopathy:  ?   Cervical: No cervical adenopathy.  ?Skin: ?   Findings: No erythema or rash.  ?Neurological:  ?   Mental Status: She is alert.  ?Psychiatric:     ?   Mood and Affect: Mood normal.     ?   Behavior: Behavior normal.  ? ? ? ?Outpatient Encounter Medications as of 08/07/2021  ?Medication Sig  ? celecoxib (CELEBREX) 100 MG capsule Take 100 mg by mouth 2 (two) times daily.  ? Cholecalciferol (VITAMIN D) 2000 UNITS tablet Take 2,000 Units by mouth daily.  ? cyanocobalamin 1000 MCG tablet Take 1,000 mcg by mouth daily.  ? diltiazem (DILT-XR) 240 MG 24 hr capsule Take 1 capsule (240 mg total) by mouth daily.  ?  dorzolamide-timolol (COSOPT) 22.3-6.8 MG/ML ophthalmic solution INSTILL 1 DROP INTO EACH EYE TWICE DAILY  ? fluticasone (FLONASE) 50 MCG/ACT nasal spray USE TWO SPRAY(S) IN EACH NOSTRIL ONCE DAILY  ? gabapentin (NEURONTIN) 300 MG capsule Take 1 capsule (300 mg total) by mouth at bedtime.  ? InFLIXimab (REMICADE  IV) Inject into the vein every 6 (six) weeks. Per Dr Jefm Bryant  ? latanoprost (XALATAN) 0.005 % ophthalmic solution Place 1 drop into both eyes at bedtime.  ? losartan (COZAAR) 100 MG tablet TAKE 1 TABLET DAILY  ? lovastatin (MEVACOR) 40 MG tablet Take 1 tablet (40 mg total) by mouth daily.  ? methotrexate 25 MG/ML SOLN Inject 17.5 mg into the skin once a week. Patient takes 17.5 mg (0.7 ml) on Monday or Tuesday of each week.  ? mupirocin ointment (BACTROBAN) 2 % Apply 1 application topically 2 (two) times daily.  ? omeprazole (PRILOSEC) 20 MG capsule TAKE 1 CAPSULE DAILY.  ? phenazopyridine (PYRIDIUM) 200 MG tablet Take 1 tablet (200 mg total) by mouth 3 (three) times daily.  ? polyethylene glycol (MIRALAX / GLYCOLAX) packet Take 17 g by mouth daily.  ? Probiotic Product (ALIGN) 4 MG CAPS Take one capsule daily  ? psyllium (METAMUCIL) 58.6 % packet Take 1 packet by mouth daily.  ? triamcinolone cream (KENALOG) 0.1 % Apply 1 application topically 2 (two) times daily.  ? triamterene-hydrochlorothiazide (MAXZIDE-25) 37.5-25 MG tablet TAKE 1 TABLET DAILY  ? ?No facility-administered encounter medications on file as of 08/07/2021.  ?  ? ?Lab Results  ?Component Value Date  ? WBC 5.8 08/07/2021  ? HGB 12.7 08/07/2021  ? HCT 38.9 08/07/2021  ? PLT 262.0 08/07/2021  ? GLUCOSE 89 08/07/2021  ? CHOL 186 08/07/2021  ? TRIG 87.0 08/07/2021  ? HDL 76.70 08/07/2021  ? [employer] 92 08/07/2021  ? ALT 12 08/07/2021  ? AST 24 08/07/2021  ? NA 137 08/07/2021  ? K 3.9 08/07/2021  ? CL 99 08/07/2021  ? CREATININE 0.95 08/07/2021  ? BUN 22 08/07/2021  ? CO2 30 08/07/2021  ? TSH 1.71 06/18/2021  ? INR 1.0 12/03/2020  ? ? ?MR Brain Wo  Contrast ? ?Result Date: 04/23/2021 ?CLINICAL DATA:  Provided history: Dizziness, nonspecific. Additional history provided by scanning technologist: Syncopal episode 3-4 weeks ago. EXAM: MRI HEAD WITHOUT CONTRAST TECHNIQUE: Multi

## 2021-08-11 ENCOUNTER — Encounter: Payer: Self-pay | Admitting: Internal Medicine

## 2021-08-11 DIAGNOSIS — D329 Benign neoplasm of meninges, unspecified: Secondary | ICD-10-CM | POA: Insufficient documentation

## 2021-08-11 DIAGNOSIS — M25561 Pain in right knee: Secondary | ICD-10-CM | POA: Insufficient documentation

## 2021-08-11 NOTE — Assessment & Plan Note (Signed)
Followed by rheumatology.  Desires no further intervention at this time.  Follow.  ?

## 2021-08-11 NOTE — Assessment & Plan Note (Signed)
Has declined f/u with hematology.  Myeloma panel (03/2021) - negative M spike.  Stable.   

## 2021-08-11 NOTE — Assessment & Plan Note (Signed)
Evaluated by GI 09/10/2020-pancreatic cyst seen on CT-plan MRI follow-up in 1 year ?

## 2021-08-11 NOTE — Assessment & Plan Note (Signed)
MRI Brain without Contrast 04/23/2021: 1.3 cm dural-based mass along the anterior falx, likely reflecting a meningioma. No significant mass effect upon the adjacent frontal lobes. No adjacent parenchymal edema. Minimal chronic small-vessel ischemic changes within the cerebral white matter. Mild generalized parenchymal atrophy. Mild paranasal sinus disease, as described.  Saw neurology (Dr Manuella Ghazi) -  felt was an incidental finding, no signs and symptoms suggestive of pressure, vasogenic edema. I don't think she needs surveillance scans for now.  ? ? ?

## 2021-08-11 NOTE — Assessment & Plan Note (Signed)
Discussed increased stress.  She has good support.  Does not feel needs any further intervention.  Follow.  ?

## 2021-08-11 NOTE — Assessment & Plan Note (Signed)
Off aldactone and triam/hctz.  Feeling better.  Recheck met b today.  ?

## 2021-08-11 NOTE — Assessment & Plan Note (Signed)
On losartan and diltiazem.  Off aldactone.  Blood pressure is doing well.  Feels better. Check metabolic panel.  Follow pressures.  ?

## 2021-08-11 NOTE — Assessment & Plan Note (Signed)
Continue lovastatin 

## 2021-08-11 NOTE — Assessment & Plan Note (Signed)
Followed by rheumatology.  Has been receiving Remicade and MTX.  

## 2021-08-11 NOTE — Assessment & Plan Note (Signed)
No upper symptoms reported.  Continue Prilosec. 

## 2021-08-11 NOTE — Assessment & Plan Note (Signed)
Continue lovastatin.  Follow lipid panel and liver function tests.   

## 2021-08-11 NOTE — Assessment & Plan Note (Signed)
Follow cbc.  

## 2021-08-12 DIAGNOSIS — M05712 Rheumatoid arthritis with rheumatoid factor of left shoulder without organ or systems involvement: Secondary | ICD-10-CM | POA: Diagnosis not present

## 2021-10-02 ENCOUNTER — Other Ambulatory Visit: Payer: Self-pay | Admitting: Internal Medicine

## 2021-10-07 DIAGNOSIS — M0579 Rheumatoid arthritis with rheumatoid factor of multiple sites without organ or systems involvement: Secondary | ICD-10-CM | POA: Diagnosis not present

## 2021-10-08 ENCOUNTER — Other Ambulatory Visit: Payer: Self-pay | Admitting: Family

## 2021-10-08 DIAGNOSIS — K219 Gastro-esophageal reflux disease without esophagitis: Secondary | ICD-10-CM

## 2021-10-14 ENCOUNTER — Telehealth: Payer: Self-pay | Admitting: Internal Medicine

## 2021-10-14 NOTE — Telephone Encounter (Signed)
Santiago Glad from [employer] called in stating that there is an drug to drug interaction with medication (omeprazole (PRILOSEC) 20 MG capsule) and (methotrexate 25 MG/ML SOLN)... Santiago Glad is requesting callback at [phone] option 2... Reference number is [id number]... Santiago Glad stated that medication will be placed on hold.Marland KitchenMarland Kitchen

## 2021-10-15 NOTE — Telephone Encounter (Signed)
S/w Elberta Fortis, Pharmacist at [facility] - advised ok to fill, aware of interaction, no issues as of yet, being monitored by Rheumatology

## 2021-10-15 NOTE — Telephone Encounter (Signed)
Please call and notify - ok to refill the omeprazole.  She has been on these medications and is being monitored by rheumatology.  Let me know if any questions or problems.

## 2021-10-23 ENCOUNTER — Other Ambulatory Visit: Payer: Self-pay

## 2021-10-23 NOTE — Telephone Encounter (Signed)
Patient called and is out of her medication.

## 2021-10-24 NOTE — Telephone Encounter (Signed)
The medication is pended for mail order pharmacy.  If she is out or her medication, does she want mail order or local pharmacy.

## 2021-10-25 MED ORDER — GABAPENTIN 300 MG PO CAPS
300.0000 mg | ORAL_CAPSULE | Freq: Every day | ORAL | 1 refills | Status: DC
Start: 2021-10-25 — End: 2022-06-02

## 2021-10-25 NOTE — Telephone Encounter (Signed)
I went ahead and sent gabapentin to mail order pharmacy.  Let me know if she needs any to go to local pharmacy.

## 2021-10-30 NOTE — Telephone Encounter (Signed)
Pt called and stated that she had wanted to be sent to mail order. If any issues & she doesn't receive in timely fashion then she will cal lus back to send locally.

## 2021-11-13 ENCOUNTER — Encounter: Payer: Medicare Other | Admitting: Internal Medicine

## 2021-11-18 DIAGNOSIS — M4807 Spinal stenosis, lumbosacral region: Secondary | ICD-10-CM | POA: Diagnosis not present

## 2021-11-18 DIAGNOSIS — M0609 Rheumatoid arthritis without rheumatoid factor, multiple sites: Secondary | ICD-10-CM | POA: Diagnosis not present

## 2021-11-18 DIAGNOSIS — Z111 Encounter for screening for respiratory tuberculosis: Secondary | ICD-10-CM | POA: Diagnosis not present

## 2021-11-18 DIAGNOSIS — Z7960 Long term (current) use of unspecified immunomodulators and immunosuppressants: Secondary | ICD-10-CM | POA: Diagnosis not present

## 2021-11-18 DIAGNOSIS — M0579 Rheumatoid arthritis with rheumatoid factor of multiple sites without organ or systems involvement: Secondary | ICD-10-CM | POA: Diagnosis not present

## 2021-11-21 ENCOUNTER — Encounter: Payer: Self-pay | Admitting: Internal Medicine

## 2021-11-21 ENCOUNTER — Ambulatory Visit (INDEPENDENT_AMBULATORY_CARE_PROVIDER_SITE_OTHER): Payer: Medicare Other | Admitting: Internal Medicine

## 2021-11-21 VITALS — BP 130/64 | HR 65 | Temp 97.9°F | Resp 18 | Ht 60.0 in | Wt 102.0 lb

## 2021-11-21 DIAGNOSIS — I700 Atherosclerosis of aorta: Secondary | ICD-10-CM | POA: Diagnosis not present

## 2021-11-21 DIAGNOSIS — M069 Rheumatoid arthritis, unspecified: Secondary | ICD-10-CM

## 2021-11-21 DIAGNOSIS — I10 Essential (primary) hypertension: Secondary | ICD-10-CM

## 2021-11-21 DIAGNOSIS — E871 Hypo-osmolality and hyponatremia: Secondary | ICD-10-CM | POA: Diagnosis not present

## 2021-11-21 DIAGNOSIS — E7800 Pure hypercholesterolemia, unspecified: Secondary | ICD-10-CM

## 2021-11-21 DIAGNOSIS — F439 Reaction to severe stress, unspecified: Secondary | ICD-10-CM | POA: Diagnosis not present

## 2021-11-21 DIAGNOSIS — D472 Monoclonal gammopathy: Secondary | ICD-10-CM

## 2021-11-21 DIAGNOSIS — D329 Benign neoplasm of meninges, unspecified: Secondary | ICD-10-CM | POA: Diagnosis not present

## 2021-11-21 DIAGNOSIS — K219 Gastro-esophageal reflux disease without esophagitis: Secondary | ICD-10-CM | POA: Diagnosis not present

## 2021-11-21 DIAGNOSIS — K862 Cyst of pancreas: Secondary | ICD-10-CM | POA: Diagnosis not present

## 2021-11-21 DIAGNOSIS — L989 Disorder of the skin and subcutaneous tissue, unspecified: Secondary | ICD-10-CM

## 2021-11-21 DIAGNOSIS — D649 Anemia, unspecified: Secondary | ICD-10-CM | POA: Diagnosis not present

## 2021-11-21 LAB — LIPID PANEL
Cholesterol: 174 mg/dL (ref 0–200)
HDL: 66.7 mg/dL (ref >39.00)
LDL Cholesterol: 88 mg/dL (ref 0–99)
NonHDL: 107.16
Total CHOL/HDL Ratio: 3
Triglycerides: 98 mg/dL (ref 0.0–149.0)
VLDL: 19.6 mg/dL (ref 0.0–40.0)

## 2021-11-21 LAB — BASIC METABOLIC PANEL
BUN: 17 mg/dL (ref 6–23)
CO2: 28 mEq/L (ref 19–32)
Calcium: 9.6 mg/dL (ref 8.4–10.5)
Chloride: 98 mEq/L (ref 96–112)
Creatinine, Ser: 0.97 mg/dL (ref 0.40–1.20)
GFR: 52.87 mL/min — ABNORMAL LOW (ref >60.00)
Glucose, Bld: 91 mg/dL (ref 70–99)
Potassium: 3.6 mEq/L (ref 3.5–5.1)
Sodium: 136 mEq/L (ref 135–145)

## 2021-11-21 NOTE — Assessment & Plan Note (Signed)
MRI Brain without Contrast 04/23/2021: 1.3 cm dural-based mass along the anterior falx, likely reflecting a meningioma. No significant mass effect upon the adjacent frontal lobes. No adjacent parenchymal edema. Minimal chronic small-vessel ischemic changes within the cerebral white matter. Mild generalized parenchymal atrophy. Mild paranasal sinus disease, as described.  Saw neurology (Dr Shah) -  felt was an incidental finding, no signs and symptoms suggestive of pressure, vasogenic edema. did not feel she needed surveillance scans for now.    

## 2021-11-21 NOTE — Assessment & Plan Note (Signed)
She has good support.  Does not feel needs any further intervention.  Follow.  

## 2021-11-21 NOTE — Progress Notes (Signed)
Patient ID: Porsche Noguchi, female   DOB: 10-27-1932, 88 y.o.   MRN: [id number]   Subjective:    Patient ID: [name], female    DOB: 1932-08-06, 88 y.o.   MRN: [id number]   Patient here for a scheduled follow up.  Marland Kitchen   HPI Here to follow regarding her blood pressures.  Had f/u with Dr Posey Pronto - f/u RA - 11/18/21 - recommended continuing MTX and folic acid.  Continue remicade. Joints stable.  Stays active.  No chest pain or sob reported.  No abdominal pain or bowel change reported.  Eating well.  Does exercise 2x/day.  Helps right knee.  Does have persistent skin lesion - right forearm.  Request referral to dermatology.     Past Medical History:  Diagnosis Date   Diverticulosis    Fibrocystic breast disease    GERD (gastroesophageal reflux disease)    Glaucoma 12/04/2020   History of kidney stones    Hypercholesterolemia    Hypertension    Hypertension    IBS (irritable bowel syndrome)    Inflammatory arthritis    positive anti-CCP abs, s/p prednisone, MTX, Remicade   Nephrolithiasis    Osteopenia    GI upset with Fosamax   Pancreatitis 2007   s/p ERCP   Pancreatitis    PONV (postoperative nausea and vomiting)    Renal insufficiency    Spinal stenosis of lumbosacral region    Past Surgical History:  Procedure Laterality Date   ABDOMINAL HYSTERECTOMY  1993   ovaries not removed   BREAST BIOPSY Left 1984   negative   BREAST CYST EXCISION Left 2010   negative   COLONOSCOPY N/A 12/05/2020   Procedure: COLONOSCOPY;  Surgeon: Lesly Rubenstein, MD;  Location: [facility];  Service: Endoscopy;  Laterality: N/A;   LITHOTRIPSY     LUMBAR LAMINECTOMY  1983   LUMBAR LAMINECTOMY/DECOMPRESSION MICRODISCECTOMY N/A 08/18/2016   Procedure: LUMBAR LAMINECTOMY/DECOMPRESSION MICRODISCECTOMY 1 LEVEL;  Surgeon: Bayard Hugger, MD;  Location: [facility] ORS;  Service: Neurosurgery;  Laterality: N/A;  L4-5 Laminectomy, MIS, L5 foraminotomy   TRIGGER FINGER RELEASE     right ring  finger   Family History  Problem Relation Age of Onset   Ovarian cancer Mother    Renal Disease Mother    Heart disease Mother    Diabetes Mother    Hypertension Mother    Heart disease Father    Breast cancer Neg Hx    Social History   Socioeconomic History   Marital status: Married    Spouse name: Not on file   Number of children: 4   Years of education: Not on file   Highest education level: Not on file  Occupational History   Not on file  Tobacco Use   Smoking status: Never   Smokeless tobacco: Never  Vaping Use   Vaping Use: Never used  Substance and Sexual Activity   Alcohol use: No    Alcohol/week: 0.0 standard drinks of alcohol   Drug use: No   Sexual activity: Not on file  Other Topics Concern   Not on file  Social History Narrative   Not on file   Social Determinants of Health   Financial Resource Strain: Not on file  Food Insecurity: Not on file  Transportation Needs: Not on file  Physical Activity: Not on file  Stress: Not on file  Social Connections: Not on file     Review of Systems  Constitutional:  Negative for appetite change and  unexpected weight change.  HENT:  Negative for congestion and sinus pressure.   Respiratory:  Negative for cough, chest tightness and shortness of breath.   Cardiovascular:  Negative for chest pain, palpitations and leg swelling.  Gastrointestinal:  Negative for abdominal pain, diarrhea, nausea and vomiting.  Genitourinary:  Negative for difficulty urinating and dysuria.  Musculoskeletal:  Negative for joint swelling and myalgias.       Knee pain as outlined.  No swelling.   Skin:  Negative for rash.       Right forearm skin lesion.   Neurological:  Negative for dizziness, light-headedness and headaches.  Psychiatric/Behavioral:  Negative for agitation and dysphoric mood.        Objective:     BP 130/64 (BP Location: Left Arm, Patient Position: Sitting, Cuff Size: Small)   Pulse 65   Temp 97.9 F (36.6 C)  (Temporal)   Resp 18   Ht 5' (1.524 m)   Wt 102 lb (46.3 kg)   SpO2 97%   BMI 19.92 kg/m  Wt Readings from Last 3 Encounters:  11/21/21 102 lb (46.3 kg)  08/07/21 102 lb (46.3 kg)  06/18/21 104 lb (47.2 kg)    Physical Exam Vitals reviewed.  Constitutional:      General: She is not in acute distress.    Appearance: Normal appearance.  HENT:     Head: Normocephalic and atraumatic.     Right Ear: External ear normal.     Left Ear: External ear normal.  Eyes:     General: No scleral icterus.       Right eye: No discharge.        Left eye: No discharge.     Conjunctiva/sclera: Conjunctivae normal.  Neck:     Thyroid: No thyromegaly.  Cardiovascular:     Rate and Rhythm: Normal rate and regular rhythm.  Pulmonary:     Effort: No respiratory distress.     Breath sounds: Normal breath sounds. No wheezing.  Abdominal:     General: Bowel sounds are normal.     Palpations: Abdomen is soft.     Tenderness: There is no abdominal tenderness.  Musculoskeletal:        General: No swelling or tenderness.     Cervical back: Neck supple. No tenderness.  Lymphadenopathy:     Cervical: No cervical adenopathy.  Skin:    Findings: No erythema or rash.     Comments: Right forearm skin lesion.   Neurological:     Mental Status: She is alert.  Psychiatric:        Mood and Affect: Mood normal.        Behavior: Behavior normal.      Outpatient Encounter Medications as of 11/21/2021  Medication Sig   Cholecalciferol (VITAMIN D) 2000 UNITS tablet Take 2,000 Units by mouth daily.   cyanocobalamin 1000 MCG tablet Take 1,000 mcg by mouth daily.   diltiazem (DILT-XR) 240 MG 24 hr capsule Take 1 capsule (240 mg total) by mouth daily.   dorzolamide-timolol (COSOPT) 22.3-6.8 MG/ML ophthalmic solution INSTILL 1 DROP INTO EACH EYE TWICE DAILY   fluticasone (FLONASE) 50 MCG/ACT nasal spray USE TWO SPRAY(S) IN EACH NOSTRIL ONCE DAILY   FOLIC ACID PO Take by mouth.   gabapentin (NEURONTIN) 300  MG capsule Take 1 capsule (300 mg total) by mouth at bedtime.   InFLIXimab (REMICADE IV) Inject into the vein every 6 (six) weeks. Per Dr Jefm Bryant   latanoprost (XALATAN) 0.005 % ophthalmic solution Place 1  drop into both eyes at bedtime.   losartan (COZAAR) 100 MG tablet TAKE 1 TABLET DAILY   lovastatin (MEVACOR) 40 MG tablet Take 1 tablet (40 mg total) by mouth daily.   methotrexate 25 MG/ML SOLN Inject 17.5 mg into the skin once a week. Patient takes 17.5 mg (0.7 ml) on Monday or Tuesday of each week.   mupirocin ointment (BACTROBAN) 2 % Apply 1 application topically 2 (two) times daily.   omeprazole (PRILOSEC) 20 MG capsule TAKE 1 CAPSULE DAILY.   phenazopyridine (PYRIDIUM) 200 MG tablet Take 1 tablet (200 mg total) by mouth 3 (three) times daily.   polyethylene glycol (MIRALAX / GLYCOLAX) packet Take 17 g by mouth daily.   Probiotic Product (ALIGN) 4 MG CAPS Take one capsule daily   psyllium (METAMUCIL) 58.6 % packet Take 1 packet by mouth daily.   triamcinolone cream (KENALOG) 0.1 % Apply 1 application topically 2 (two) times daily.   triamterene-hydrochlorothiazide (MAXZIDE-25) 37.5-25 MG tablet TAKE 1 TABLET DAILY   [DISCONTINUED] celecoxib (CELEBREX) 100 MG capsule Take 100 mg by mouth 2 (two) times daily. (Patient not taking: Reported on 11/21/2021)   No facility-administered encounter medications on file as of 11/21/2021.     Lab Results  Component Value Date   WBC 5.8 08/07/2021   HGB 12.7 08/07/2021   HCT 38.9 08/07/2021   PLT 262.0 08/07/2021   GLUCOSE 91 11/21/2021   CHOL 174 11/21/2021   TRIG 98.0 11/21/2021   HDL 66.70 11/21/2021   LDLCALC 88 11/21/2021   ALT 12 08/07/2021   AST 24 08/07/2021   NA 136 11/21/2021   K 3.6 11/21/2021   CL 98 11/21/2021   CREATININE 0.97 11/21/2021   BUN 17 11/21/2021   CO2 28 11/21/2021   TSH 1.71 06/18/2021   INR 1.0 12/03/2020    MR Brain Wo Contrast  Result Date: 04/23/2021 CLINICAL DATA:  Provided history: Dizziness,  nonspecific. Additional history provided by scanning technologist: Syncopal episode 3-4 weeks ago. EXAM: MRI HEAD WITHOUT CONTRAST TECHNIQUE: Multiplanar, multiecho pulse sequences of the brain and surrounding structures were obtained without intravenous contrast. COMPARISON:  No pertinent prior exams available for comparison. FINDINGS: Brain: Mild generalized cerebral and cerebellar atrophy. Minimal multifocal T2 FLAIR hyperintense signal abnormality within the cerebral white matter, nonspecific but compatible chronic small vessel ischemic disease. 0.9 x 1.0 x 1.3 cm dural-based mass along the anterior falx, likely reflecting a meningioma (series 6, image 12) (series 7, image 14). No significant mass effect upon the underlying frontal lobes. No adjacent parenchymal edema. There is no acute infarct. No chronic intracranial blood products. No extra-axial fluid collection. No midline shift. Vascular: Maintained flow voids within the proximal large arterial vessels. Skull and upper cervical spine: No focal suspicious marrow lesion. Sinuses/Orbits: Visualized orbits show no acute finding. The left maxillary sinus is asymmetrically small. Minimal mucosal thickening within the bilateral maxillary sinuses. Trace mucosal thickening within the right ethmoid sinuses. IMPRESSION: 1.3 cm dural-based mass along the anterior falx, likely reflecting a meningioma. No significant mass effect upon the adjacent frontal lobes. No adjacent parenchymal edema. Minimal chronic small-vessel ischemic changes within the cerebral white matter. Mild generalized parenchymal atrophy. Mild paranasal sinus disease, as described. Electronically Signed   By: Kellie Simmering D.O.   On: 04/23/2021 12:11       Assessment & Plan:   Problem List Items Addressed This Visit     Anemia    Follow cbc.       Relevant Medications   FOLIC  ACID PO   Aortic atherosclerosis (HCC)    Continue lovastatin.      Essential hypertension    On losartan and  diltiazem.  Blood pressure is doing well.  Feels better. Check metabolic panel.  Follow pressures.       Relevant Orders   Basic Metabolic Panel (BMET) (Completed)   GERD (gastroesophageal reflux disease)    No upper symptoms reported.  Continue Prilosec.      Hypercholesterolemia - Primary    Continue lovastatin.  Follow lipid panel and liver function tests.       Relevant Orders   Lipid Profile (Completed)   Hyponatremia    Check metabolic panel today to confirm sodium stable.       Relevant Orders   Basic Metabolic Panel (BMET) (Completed)   Meningioma ([place])    MRI Brain without Contrast 04/23/2021: 1.3 cm dural-based mass along the anterior falx, likely reflecting a meningioma. No significant mass effect upon the adjacent frontal lobes. No adjacent parenchymal edema. Minimal chronic small-vessel ischemic changes within the cerebral white matter. Mild generalized parenchymal atrophy. Mild paranasal sinus disease, as described.  Saw neurology (Dr Manuella Ghazi) -  felt was an incidental finding, no signs and symptoms suggestive of pressure, vasogenic edema. did not feel she needed surveillance scans for now.         MGUS (monoclonal gammopathy of unknown significance)    Has declined f/u with hematology.  Myeloma panel (03/2021) - negative M spike.  Stable.        Pancreas cyst    Evaluated by GI 09/10/2020-pancreatic cyst seen on CT-plan MRI follow-up in 1 year. Confirm f/u.       Rheumatoid arthritis ([place])    Followed by rheumatology.  Has been receiving Remicade and MTX.       Skin lesion    Persistent right arm lesion.  Request referral to [facility].       Relevant Orders   Ambulatory referral to Dermatology   Stress    She has good support.  Does not feel needs any further intervention.  Follow.         Einar Pheasant, MD

## 2021-11-21 NOTE — Assessment & Plan Note (Signed)
Follow cbc.  

## 2021-11-21 NOTE — Assessment & Plan Note (Signed)
Continue lovastatin 

## 2021-11-21 NOTE — Assessment & Plan Note (Signed)
Check metabolic panel today to confirm sodium stable.

## 2021-11-21 NOTE — Assessment & Plan Note (Signed)
No upper symptoms reported.  Continue Prilosec. 

## 2021-11-21 NOTE — Assessment & Plan Note (Signed)
Persistent right arm lesion.  Request referral to [facility].

## 2021-11-21 NOTE — Assessment & Plan Note (Signed)
Followed by rheumatology.  Has been receiving Remicade and MTX.  

## 2021-11-21 NOTE — Assessment & Plan Note (Signed)
On losartan and diltiazem.  Blood pressure is doing well.  Feels better. Check metabolic panel.  Follow pressures.

## 2021-11-21 NOTE — Assessment & Plan Note (Signed)
Continue lovastatin.  Follow lipid panel and liver function tests.   

## 2021-11-21 NOTE — Assessment & Plan Note (Addendum)
Evaluated by GI 09/10/2020-pancreatic cyst seen on CT-plan MRI follow-up in 1 year. Confirm f/u.

## 2021-11-21 NOTE — Assessment & Plan Note (Signed)
Has declined f/u with hematology.  Myeloma panel (03/2021) - negative M spike.  Stable.   

## 2021-11-22 ENCOUNTER — Encounter: Payer: Self-pay | Admitting: *Deleted

## 2021-12-02 DIAGNOSIS — H353131 Nonexudative age-related macular degeneration, bilateral, early dry stage: Secondary | ICD-10-CM | POA: Diagnosis not present

## 2021-12-03 DIAGNOSIS — L439 Lichen planus, unspecified: Secondary | ICD-10-CM | POA: Diagnosis not present

## 2021-12-03 DIAGNOSIS — D485 Neoplasm of uncertain behavior of skin: Secondary | ICD-10-CM | POA: Diagnosis not present

## 2021-12-03 DIAGNOSIS — D2262 Melanocytic nevi of left upper limb, including shoulder: Secondary | ICD-10-CM | POA: Diagnosis not present

## 2021-12-03 DIAGNOSIS — D225 Melanocytic nevi of trunk: Secondary | ICD-10-CM | POA: Diagnosis not present

## 2021-12-03 DIAGNOSIS — D2272 Melanocytic nevi of left lower limb, including hip: Secondary | ICD-10-CM | POA: Diagnosis not present

## 2021-12-03 DIAGNOSIS — D2261 Melanocytic nevi of right upper limb, including shoulder: Secondary | ICD-10-CM | POA: Diagnosis not present

## 2021-12-03 DIAGNOSIS — L821 Other seborrheic keratosis: Secondary | ICD-10-CM | POA: Diagnosis not present

## 2021-12-03 DIAGNOSIS — D2271 Melanocytic nevi of right lower limb, including hip: Secondary | ICD-10-CM | POA: Diagnosis not present

## 2021-12-09 ENCOUNTER — Ambulatory Visit: Payer: Medicare Other | Admitting: Dermatology

## 2021-12-10 ENCOUNTER — Other Ambulatory Visit: Payer: Self-pay | Admitting: Internal Medicine

## 2021-12-10 DIAGNOSIS — E785 Hyperlipidemia, unspecified: Secondary | ICD-10-CM

## 2022-01-07 ENCOUNTER — Other Ambulatory Visit: Payer: Self-pay | Admitting: *Deleted

## 2022-01-07 DIAGNOSIS — K219 Gastro-esophageal reflux disease without esophagitis: Secondary | ICD-10-CM

## 2022-01-07 MED ORDER — OMEPRAZOLE 20 MG PO CPDR: 20.0000 mg | DELAYED_RELEASE_CAPSULE | Freq: Every day | ORAL | 3 refills | Status: DC

## 2022-01-20 DIAGNOSIS — M0579 Rheumatoid arthritis with rheumatoid factor of multiple sites without organ or systems involvement: Secondary | ICD-10-CM | POA: Diagnosis not present

## 2022-03-04 ENCOUNTER — Telehealth: Payer: Self-pay | Admitting: Internal Medicine

## 2022-03-04 NOTE — Telephone Encounter (Signed)
Copied from [place] [id number]. Topic: Medicare AWV >> Mar 04, 2022  9:17 AM Devoria Glassing wrote: Reason for CRM: Left message for patient to schedule Annual Wellness Visit.  Please schedule with Nurse Health Advisor Denisa O'Brien-Blaney, LPN at [facility]. This appt can be telephone or office visit.  Please call [phone] ask for [facility]

## 2022-03-17 DIAGNOSIS — M810 Age-related osteoporosis without current pathological fracture: Secondary | ICD-10-CM | POA: Diagnosis not present

## 2022-03-17 DIAGNOSIS — M0579 Rheumatoid arthritis with rheumatoid factor of multiple sites without organ or systems involvement: Secondary | ICD-10-CM | POA: Diagnosis not present

## 2022-03-17 DIAGNOSIS — Z7960 Long term (current) use of unspecified immunomodulators and immunosuppressants: Secondary | ICD-10-CM | POA: Diagnosis not present

## 2022-03-27 ENCOUNTER — Other Ambulatory Visit: Payer: Self-pay | Admitting: Internal Medicine

## 2022-03-31 ENCOUNTER — Telehealth: Payer: Self-pay | Admitting: Internal Medicine

## 2022-03-31 NOTE — Telephone Encounter (Signed)
Spoke with patient she declined AWV do not call  

## 2022-05-12 DIAGNOSIS — M0579 Rheumatoid arthritis with rheumatoid factor of multiple sites without organ or systems involvement: Secondary | ICD-10-CM | POA: Diagnosis not present

## 2022-05-22 ENCOUNTER — Ambulatory Visit
Admission: EM | Admit: 2022-05-22 | Discharge: 2022-05-22 | Disposition: A | Discharge status: Home / Self Care | Payer: Medicare Other | Attending: Emergency Medicine | Admitting: Emergency Medicine

## 2022-05-22 DIAGNOSIS — N3000 Acute cystitis without hematuria: Secondary | ICD-10-CM

## 2022-05-22 LAB — URINALYSIS, MICROSCOPIC (REFLEX): WBC, UA: >50 WBC/hpf (ref 0–5)

## 2022-05-22 LAB — URINALYSIS, ROUTINE W REFLEX MICROSCOPIC
Bilirubin Urine: NEGATIVE
Glucose, UA: NEGATIVE mg/dL
Ketones, ur: NEGATIVE mg/dL
Nitrite: NEGATIVE
Protein, ur: NEGATIVE mg/dL
Specific Gravity, Urine: 1.015 (ref 1.005–1.030)
pH: 7 (ref 5.0–8.0)

## 2022-05-22 MED ORDER — PHENAZOPYRIDINE HCL 200 MG PO TABS: 200.0000 mg | ORAL_TABLET | Freq: Three times a day (TID) | ORAL | 0 refills | Status: DC

## 2022-05-22 MED ORDER — NITROFURANTOIN MONOHYD MACRO 100 MG PO CAPS: 100.0000 mg | ORAL_CAPSULE | Freq: Two times a day (BID) | ORAL | 0 refills | Status: DC

## 2022-05-22 NOTE — ED Triage Notes (Signed)
Pt c/o dysuria onset noon today

## 2022-05-22 NOTE — ED Provider Notes (Signed)
[facility]    CSN: [id number] Arrival date & time: 05/22/22  1802      History   Chief Complaint No chief complaint on file.   HPI Stacey Snyder is a 89 y.o. female.   HPI  89 year old female here for evaluation of pain with urination.  The patient reports that she began experiencing increased frequency of urination earlier today and then around noon she developed burning with urination along with urinary urgency.  She has not had a fever she denies any blood in her urine, cloudiness to her urine, abdominal pain, back pain, nausea, or vomiting.  Past Medical History:  Diagnosis Date   Diverticulosis    Fibrocystic breast disease    GERD (gastroesophageal reflux disease)    Glaucoma 12/04/2020   History of kidney stones    Hypercholesterolemia    Hypertension    Hypertension    IBS (irritable bowel syndrome)    Inflammatory arthritis    positive anti-CCP abs, s/p prednisone, MTX, Remicade   Nephrolithiasis    Osteopenia    GI upset with Fosamax   Pancreatitis 2007   s/p ERCP   Pancreatitis    PONV (postoperative nausea and vomiting)    Renal insufficiency    Spinal stenosis of lumbosacral region     Patient Active Problem List   Diagnosis Date Noted   Right knee pain 08/11/2021   Meningioma ([place]) 08/11/2021   Arm lesion 06/22/2021   Headache 04/12/2021   Glaucoma 12/04/2020   Lower GI bleed 12/03/2020   Hypokalemia 12/03/2020   Pancreas cyst 09/12/2020   Aortic atherosclerosis ([place]) 07/30/2020   Diarrhea 07/10/2020   Right shoulder pain 02/19/2019   Rash 02/19/2019   MGUS (monoclonal gammopathy of unknown significance) 01/19/2018   Cramps, extremity 12/02/2017   Abdominal bruit 01/16/2017   Lumbar stenosis 08/18/2016   Abdominal pain 12/03/2015   Abnormal liver function tests 10/18/2015   Anemia 10/18/2015   Hyponatremia 10/07/2015   Lip lesion 04/23/2015   Scalp lesion 04/23/2015   Loss of weight 04/23/2015   Stress  04/23/2015   Left shoulder pain 12/20/2014   UTI (urinary tract infection) 08/20/2014   Health care maintenance 08/20/2014   Skin lesion 04/17/2014   Fatigue 04/13/2014   Shoulder pain, right 12/17/2013   Trigger finger 08/21/2013   Left hip pain 04/17/2013   GERD (gastroesophageal reflux disease) 04/03/2012   Rheumatoid arthritis ([place]) 03/30/2012   Diverticulosis 03/30/2012   Osteopenia 03/30/2012   Essential hypertension 03/30/2012   Hypercholesterolemia 03/30/2012    Past Surgical History:  Procedure Laterality Date   ABDOMINAL HYSTERECTOMY  1993   ovaries not removed   BREAST BIOPSY Left 1984   negative   BREAST CYST EXCISION Left 2010   negative   COLONOSCOPY N/A 12/05/2020   Procedure: COLONOSCOPY;  Surgeon: Lesly Rubenstein, MD;  Location: [facility];  Service: Endoscopy;  Laterality: N/A;   LITHOTRIPSY     LUMBAR LAMINECTOMY  1983   LUMBAR LAMINECTOMY/DECOMPRESSION MICRODISCECTOMY N/A 08/18/2016   Procedure: LUMBAR LAMINECTOMY/DECOMPRESSION MICRODISCECTOMY 1 LEVEL;  Surgeon: Bayard Hugger, MD;  Location: [facility] ORS;  Service: Neurosurgery;  Laterality: N/A;  L4-5 Laminectomy, MIS, L5 foraminotomy   TRIGGER FINGER RELEASE     right ring finger    OB History   No obstetric history on file.      Home Medications    Prior to Admission medications   Medication Sig Start Date End Date Taking? Authorizing Provider  nitrofurantoin, macrocrystal-monohydrate, (MACROBID) 100 MG  capsule Take 1 capsule (100 mg total) by mouth 2 (two) times daily. 05/22/22  Yes Margarette Canada, NP  phenazopyridine (PYRIDIUM) 200 MG tablet Take 1 tablet (200 mg total) by mouth 3 (three) times daily. 05/22/22  Yes Margarette Canada, NP  Cholecalciferol (VITAMIN D) 2000 UNITS tablet Take 2,000 Units by mouth daily.    [provider]  cyanocobalamin 1000 MCG tablet Take 1,000 mcg by mouth daily.    [provider]  DILT-XR 240 MG 24 hr capsule TAKE 1 CAPSULE DAILY 12/10/21   Dutch Quint B, FNP  dorzolamide-timolol (COSOPT) 22.3-6.8 MG/ML ophthalmic solution INSTILL 1 DROP INTO EACH EYE TWICE DAILY 12/17/17   [provider]  fluticasone (FLONASE) 50 MCG/ACT nasal spray USE TWO SPRAY(S) IN EACH NOSTRIL ONCE DAILY 01/14/17   Einar Pheasant, MD  FOLIC ACID PO Take by mouth.    [provider]  gabapentin (NEURONTIN) 300 MG capsule Take 1 capsule (300 mg total) by mouth at bedtime. 10/25/21   Einar Pheasant, MD  InFLIXimab (REMICADE IV) Inject into the vein every 6 (six) weeks. Per Dr Jefm Bryant    [provider]  latanoprost (XALATAN) 0.005 % ophthalmic solution Place 1 drop into both eyes at bedtime. 01/18/18   [provider]  losartan (COZAAR) 100 MG tablet TAKE 1 TABLET DAILY 03/28/22   Einar Pheasant, MD  lovastatin (MEVACOR) 40 MG tablet TAKE 1 TABLET DAILY 12/10/21   Dutch Quint B, FNP  methotrexate 25 MG/ML SOLN Inject 17.5 mg into the skin once a week. Patient takes 17.5 mg (0.7 ml) on Monday or Tuesday of each week.    [provider]  mupirocin ointment (BACTROBAN) 2 % Apply 1 application topically 2 (two) times daily. 11/15/20   Einar Pheasant, MD  omeprazole (PRILOSEC) 20 MG capsule Take 1 capsule (20 mg total) by mouth daily. 01/07/22   Einar Pheasant, MD  polyethylene glycol (MIRALAX / GLYCOLAX) packet Take 17 g by mouth daily.    [provider]  Probiotic Product (ALIGN) 4 MG CAPS Take one capsule daily 03/11/19   Einar Pheasant, MD  psyllium (METAMUCIL) 58.6 % packet Take 1 packet by mouth daily.    [provider]  triamcinolone cream (KENALOG) 0.1 % Apply 1 application topically 2 (two) times daily. 02/18/19   Einar Pheasant, MD  triamterene-hydrochlorothiazide [facility]) 37.5-25 MG tablet TAKE 1 TABLET DAILY 07/17/21   Kennyth Arnold, FNP    Family History Family History  Problem Relation Age of Onset   Ovarian cancer Mother    Renal Disease Mother    Heart disease Mother    Diabetes  Mother    Hypertension Mother    Heart disease Father    Breast cancer Neg Hx     Social History Social History   Tobacco Use   Smoking status: Never   Smokeless tobacco: Never  Vaping Use   Vaping Use: Never used  Substance Use Topics   Alcohol use: No    Alcohol/week: 0.0 standard drinks of alcohol   Drug use: No     Allergies   Carisoprodol, Metronidazole, Codeine, Nsaids, Skelaxin [metaxalone], Tenormin [atenolol], and Tramadol   Review of Systems Review of Systems  Constitutional:  Negative for fever.  Gastrointestinal:  Negative for abdominal pain.  Genitourinary:  Positive for dysuria, frequency and urgency. Negative for hematuria.  Musculoskeletal:  Negative for back pain.     Physical Exam Triage Vital Signs ED Triage Vitals  Enc Vitals Group  BP 05/22/22 1839 (!) 218/92     Pulse Rate 05/22/22 1835 64     Resp --      Temp 05/22/22 1835 98.2 F (36.8 C)     Temp Source 05/22/22 1835 Oral     SpO2 05/22/22 1835 98 %     Weight 05/22/22 1834 104 lb (47.2 kg)     Height 05/22/22 1834 '5\' 1"'$  (1.549 m)     Head Circumference --      Peak Flow --      Pain Score 05/22/22 1834 0     Pain Loc --      Pain Edu? --      Excl. in [place]? --    No data found.  Updated Vital Signs BP (!) 218/92 (BP Location: Left Arm)   Pulse 64   Temp 98.2 F (36.8 C) (Oral)   Ht '5\' 1"'$  (1.549 m)   Wt 104 lb (47.2 kg)   SpO2 98%   BMI 19.65 kg/m   Visual Acuity Right Eye Distance:   Left Eye Distance:   Bilateral Distance:    Right Eye Near:   Left Eye Near:    Bilateral Near:     Physical Exam Vitals and nursing note reviewed.  Constitutional:      Appearance: Normal appearance. She is not ill-appearing.  HENT:     Head: Normocephalic and atraumatic.  Cardiovascular:     Rate and Rhythm: Normal rate and regular rhythm.     Pulses: Normal pulses.     Heart sounds: Normal heart sounds. No murmur heard.    No friction rub. No gallop.  Pulmonary:      Effort: Pulmonary effort is normal.     Breath sounds: Normal breath sounds. No wheezing, rhonchi or rales.  Abdominal:     Tenderness: There is no right CVA tenderness or left CVA tenderness.  Skin:    General: Skin is warm.     Capillary Refill: Capillary refill takes less than 2 seconds.  Neurological:     General: No focal deficit present.     Mental Status: She is alert and oriented to person, place, and time.  Psychiatric:        Mood and Affect: Mood normal.        Behavior: Behavior normal.        Thought Content: Thought content normal.        Judgment: Judgment normal.      UC Treatments / Results  Labs (all labs ordered are listed, but only abnormal results are displayed) Labs Reviewed  URINALYSIS, ROUTINE W REFLEX MICROSCOPIC - Abnormal; Notable for the following components:      Result Value   APPearance HAZY (*)    Hgb urine dipstick SMALL (*)    Leukocytes,Ua TRACE (*)    All other components within normal limits  URINALYSIS, MICROSCOPIC (REFLEX) - Abnormal; Notable for the following components:   Bacteria, UA FEW (*)    All other components within normal limits  URINE CULTURE    EKG   Radiology No results found.  Procedures Procedures (including critical care time)  Medications Ordered in UC Medications - No data to display  Initial Impression / Assessment and Plan / UC Course  I have reviewed the triage vital signs and the nursing notes.  Pertinent labs & imaging results that were available during my care of the patient were reviewed by me and considered in my medical decision making (see chart for details).  Patient is a nontoxic-appearing 89 year old female here for evaluation of dysuria, urgency, and frequency of urination that started earlier today became worse at around noon.  She is afebrile and she does not have any abdominal pain or back pain.  She has had no nausea or vomiting and she denies hematuria or cloudiness to her urine.  She has  no CVA tenderness on exam and her cardiopulmonary dam is benign.  I will order urinalysis to look for the presence of infection.  Urinalysis is positive for hazy appearance with small hemoglobin and trace leukocyte esterase.  Negative for nitrites or protein.  Reflex microscopy shows greater than 50 WBCs with WBC clumps and few bacteria.  6-10 RBCs.  I will send urine for culture.  I will discharge patient home on Macrobid 100 mg twice daily for 5 days and also sent a prescription for Pyridium to help with urinary discomfort.  Patient should increase her oral fluid intake so that she increases urine production and flushes her urinary tract.  Return precautions reviewed.   Final Clinical Impressions(s) / UC Diagnoses   Final diagnoses:  Acute cystitis without hematuria     Discharge Instructions      Take the Macrobid twice daily for 5 days with food for treatment of urinary tract infection.  Use the Pyridium every 8 hours as needed for urinary discomfort.  This will turn your urine a bright red-orange.  Increase your oral fluid intake so that you increase your urine production and or flushing your urinary system.  Take an over-the-counter probiotic, such as Culturelle-Align-Activia, 1 hour after each dose of antibiotic to prevent diarrhea or yeast infections from forming.  We will culture urine and change the antibiotics if necessary.  Return for reevaluation, or see your primary care provider, for any new or worsening symptoms.      ED Prescriptions     Medication Sig Dispense Auth. Provider   nitrofurantoin, macrocrystal-monohydrate, (MACROBID) 100 MG capsule Take 1 capsule (100 mg total) by mouth 2 (two) times daily. 10 capsule Margarette Canada, NP   phenazopyridine (PYRIDIUM) 200 MG tablet Take 1 tablet (200 mg total) by mouth 3 (three) times daily. 6 tablet Margarette Canada, NP      PDMP not reviewed this encounter.   Margarette Canada, NP 05/22/22 1905

## 2022-05-22 NOTE — Discharge Instructions (Addendum)

## 2022-05-24 LAB — URINE CULTURE: Culture: 60000 — AB

## 2022-05-26 ENCOUNTER — Encounter: Payer: Self-pay | Admitting: Internal Medicine

## 2022-05-26 ENCOUNTER — Ambulatory Visit (INDEPENDENT_AMBULATORY_CARE_PROVIDER_SITE_OTHER): Payer: Medicare Other | Admitting: Internal Medicine

## 2022-05-26 ENCOUNTER — Other Ambulatory Visit: Payer: Self-pay | Admitting: Internal Medicine

## 2022-05-26 VITALS — BP 126/72 | HR 66 | Temp 97.9°F | Resp 16 | Ht 61.0 in | Wt 105.0 lb

## 2022-05-26 DIAGNOSIS — I700 Atherosclerosis of aorta: Secondary | ICD-10-CM | POA: Diagnosis not present

## 2022-05-26 DIAGNOSIS — I10 Essential (primary) hypertension: Secondary | ICD-10-CM | POA: Diagnosis not present

## 2022-05-26 DIAGNOSIS — D472 Monoclonal gammopathy: Secondary | ICD-10-CM

## 2022-05-26 DIAGNOSIS — K219 Gastro-esophageal reflux disease without esophagitis: Secondary | ICD-10-CM

## 2022-05-26 DIAGNOSIS — D649 Anemia, unspecified: Secondary | ICD-10-CM

## 2022-05-26 DIAGNOSIS — D329 Benign neoplasm of meninges, unspecified: Secondary | ICD-10-CM

## 2022-05-26 DIAGNOSIS — M25561 Pain in right knee: Secondary | ICD-10-CM

## 2022-05-26 DIAGNOSIS — E7800 Pure hypercholesterolemia, unspecified: Secondary | ICD-10-CM

## 2022-05-26 DIAGNOSIS — M069 Rheumatoid arthritis, unspecified: Secondary | ICD-10-CM | POA: Diagnosis not present

## 2022-05-26 DIAGNOSIS — R490 Dysphonia: Secondary | ICD-10-CM

## 2022-05-26 DIAGNOSIS — R7989 Other specified abnormal findings of blood chemistry: Secondary | ICD-10-CM

## 2022-05-26 DIAGNOSIS — F439 Reaction to severe stress, unspecified: Secondary | ICD-10-CM

## 2022-05-26 DIAGNOSIS — Z23 Encounter for immunization: Secondary | ICD-10-CM | POA: Diagnosis not present

## 2022-05-26 DIAGNOSIS — K862 Cyst of pancreas: Secondary | ICD-10-CM

## 2022-05-26 LAB — CBC WITH DIFFERENTIAL/PLATELET
Basophils Absolute: 0.1 10*3/uL (ref 0.0–0.1)
Basophils Relative: 1 % (ref 0.0–3.0)
Eosinophils Absolute: 0.2 10*3/uL (ref 0.0–0.7)
Eosinophils Relative: 3.5 % (ref 0.0–5.0)
HCT: 36.9 % (ref 36.0–46.0)
Hemoglobin: 12.2 g/dL (ref 12.0–15.0)
Lymphocytes Relative: 36.5 % (ref 12.0–46.0)
Lymphs Abs: 2.3 10*3/uL (ref 0.7–4.0)
MCHC: 33.1 g/dL (ref 30.0–36.0)
MCV: 94.1 fl (ref 78.0–100.0)
Monocytes Absolute: 0.6 10*3/uL (ref 0.1–1.0)
Monocytes Relative: 10.1 % (ref 3.0–12.0)
Neutro Abs: 3 10*3/uL (ref 1.4–7.7)
Neutrophils Relative %: 48.9 % (ref 43.0–77.0)
Platelets: 296 10*3/uL (ref 150.0–400.0)
RBC: 3.92 Mil/uL (ref 3.87–5.11)
RDW: 14 % (ref 11.5–15.5)
WBC: 6.2 10*3/uL (ref 4.0–10.5)

## 2022-05-26 LAB — HEPATIC FUNCTION PANEL
ALT: 13 U/L (ref 0–35)
AST: 26 U/L (ref 0–37)
Albumin: 4.2 g/dL (ref 3.5–5.2)
Alkaline Phosphatase: 79 U/L (ref 39–117)
Bilirubin, Direct: 0.1 mg/dL (ref 0.0–0.3)
Total Bilirubin: 0.5 mg/dL (ref 0.2–1.2)
Total Protein: 7.1 g/dL (ref 6.0–8.3)

## 2022-05-26 LAB — BASIC METABOLIC PANEL
BUN: 18 mg/dL (ref 6–23)
CO2: 30 mEq/L (ref 19–32)
Calcium: 9.5 mg/dL (ref 8.4–10.5)
Chloride: 97 mEq/L (ref 96–112)
Creatinine, Ser: 0.94 mg/dL (ref 0.40–1.20)
GFR: 54.71 mL/min — ABNORMAL LOW (ref >60.00)
Glucose, Bld: 96 mg/dL (ref 70–99)
Potassium: 4.1 mEq/L (ref 3.5–5.1)
Sodium: 135 mEq/L (ref 135–145)

## 2022-05-26 LAB — LIPID PANEL
Cholesterol: 183 mg/dL (ref 0–200)
HDL: 70.5 mg/dL (ref >39.00)
LDL Cholesterol: 83 mg/dL (ref 0–99)
NonHDL: 112.58
Total CHOL/HDL Ratio: 3
Triglycerides: 146 mg/dL (ref 0.0–149.0)
VLDL: 29.2 mg/dL (ref 0.0–40.0)

## 2022-05-26 LAB — TSH: TSH: 1.55 u[IU]/mL (ref 0.35–5.50)

## 2022-05-26 MED ORDER — VALSARTAN 160 MG PO TABS: 160.0000 mg | ORAL_TABLET | Freq: Every day | ORAL | 1 refills | Status: DC

## 2022-05-26 NOTE — Progress Notes (Signed)
Subjective:    Patient ID: Stacey Snyder, female    DOB: 09/07/1932, 89 y.o.   MRN: [id number]  Patient here for  Chief Complaint  Patient presents with   Medical Management of Chronic Issues    HPI Here to follow up regarding her cholesterol and blood pressure.  Seeing Dr Posey Pronto for RA.  On remicade.  Also MTX.  States bio flex is helping her knee.  Tries to stay active.  No chest pain or sob reported.  Eating.  No nausea or vomiting.  Does report some persistent hoarseness.  States had right earache about 3-4 weeks ago.  This resolved.  States has been hoarse since summer.  No sore throat. No fever.  No sinus pressure.  Does report increased drainage and feeling some congestion in her throat.  No acid reflux.  No abdominal pain. Bowels stable.   Was seen urgent care 05/22/22 - diagnosed with UTI.  Treated with macrobid. Urinary symptoms improved.  Will notify me if has persistent problems.  Discussed previous CT scan and f/u pancreatic cysts. She declines.     Past Medical History:  Diagnosis Date   Diverticulosis    Fibrocystic breast disease    GERD (gastroesophageal reflux disease)    Glaucoma 12/04/2020   History of kidney stones    Hypercholesterolemia    Hypertension    Hypertension    IBS (irritable bowel syndrome)    Inflammatory arthritis    positive anti-CCP abs, s/p prednisone, MTX, Remicade   Nephrolithiasis    Osteopenia    GI upset with Fosamax   Pancreatitis 2007   s/p ERCP   Pancreatitis    PONV (postoperative nausea and vomiting)    Renal insufficiency    Spinal stenosis of lumbosacral region    Past Surgical History:  Procedure Laterality Date   ABDOMINAL HYSTERECTOMY  1993   ovaries not removed   BREAST BIOPSY Left 1984   negative   BREAST CYST EXCISION Left 2010   negative   COLONOSCOPY N/A 12/05/2020   Procedure: COLONOSCOPY;  Surgeon: Lesly Rubenstein, MD;  Location: [facility];  Service: Endoscopy;  Laterality: N/A;   LITHOTRIPSY      LUMBAR LAMINECTOMY  1983   LUMBAR LAMINECTOMY/DECOMPRESSION MICRODISCECTOMY N/A 08/18/2016   Procedure: LUMBAR LAMINECTOMY/DECOMPRESSION MICRODISCECTOMY 1 LEVEL;  Surgeon: Bayard Hugger, MD;  Location: [facility];  Service: Neurosurgery;  Laterality: N/A;  L4-5 Laminectomy, MIS, L5 foraminotomy   TRIGGER FINGER RELEASE     right ring finger   Family History  Problem Relation Age of Onset   Ovarian cancer Mother    Renal Disease Mother    Heart disease Mother    Diabetes Mother    Hypertension Mother    Heart disease Father    Breast cancer Neg Hx    Social History   Socioeconomic History   Marital status: Married    Spouse name: Not on file   Number of children: 4   Years of education: Not on file   Highest education level: Not on file  Occupational History   Not on file  Tobacco Use   Smoking status: Never   Smokeless tobacco: Never  Vaping Use   Vaping Use: Never used  Substance and Sexual Activity   Alcohol use: No    Alcohol/week: 0.0 standard drinks of alcohol   Drug use: No   Sexual activity: Not on file  Other Topics Concern   Not on file  Social History Narrative  Not on file   Social Determinants of Health   Financial Resource Strain: Not on file  Food Insecurity: Not on file  Transportation Needs: Not on file  Physical Activity: Not on file  Stress: Not on file  Social Connections: Not on file     Review of Systems  Constitutional:  Negative for appetite change, fever and unexpected weight change.  HENT:  Positive for congestion and postnasal drip. Negative for sinus pressure.        Hoarseness as outlined.   Respiratory:  Negative for cough, chest tightness and shortness of breath.   Cardiovascular:  Negative for chest pain, palpitations and leg swelling.  Gastrointestinal:  Negative for abdominal pain, diarrhea, nausea and vomiting.  Genitourinary:  Negative for difficulty urinating.       Recent dysuria.  Symptoms improved.    Musculoskeletal:   Negative for myalgias.       Seeing Dr Posey Pronto.  Bio flex helping knee.   Skin:  Negative for color change and rash.  Neurological:  Negative for dizziness and headaches.  Psychiatric/Behavioral:  Negative for agitation and dysphoric mood.        Objective:     BP 126/72   Pulse 66   Temp 97.9 F (36.6 C)   Resp 16   Ht '5\' 1"'$  (1.549 m)   Wt 105 lb (47.6 kg)   SpO2 98%   BMI 19.84 kg/m  Wt Readings from Last 3 Encounters:  05/26/22 105 lb (47.6 kg)  05/22/22 104 lb (47.2 kg)  11/21/21 102 lb (46.3 kg)    Physical Exam Vitals reviewed.  Constitutional:      General: She is not in acute distress.    Appearance: Normal appearance.  HENT:     Head: Normocephalic and atraumatic.     Right Ear: External ear normal.     Left Ear: External ear normal.  Eyes:     General: No scleral icterus.       Right eye: No discharge.        Left eye: No discharge.     Conjunctiva/sclera: Conjunctivae normal.  Neck:     Thyroid: No thyromegaly.  Cardiovascular:     Rate and Rhythm: Normal rate and regular rhythm.  Pulmonary:     Effort: No respiratory distress.     Breath sounds: Normal breath sounds. No wheezing.  Abdominal:     General: Bowel sounds are normal.     Palpations: Abdomen is soft.     Tenderness: There is no abdominal tenderness.  Musculoskeletal:        General: No swelling or tenderness.     Cervical back: Neck supple. No tenderness.  Lymphadenopathy:     Cervical: No cervical adenopathy.  Skin:    Findings: No erythema or rash.  Neurological:     Mental Status: She is alert.  Psychiatric:        Mood and Affect: Mood normal.        Behavior: Behavior normal.      Outpatient Encounter Medications as of 05/26/2022  Medication Sig   valsartan (DIOVAN) 160 MG tablet Take 1 tablet (160 mg total) by mouth daily.   Cholecalciferol (VITAMIN D) 2000 UNITS tablet Take 2,000 Units by mouth daily.   cyanocobalamin 1000 MCG tablet Take 1,000 mcg by mouth daily.    DILT-XR 240 MG 24 hr capsule TAKE 1 CAPSULE DAILY   dorzolamide-timolol (COSOPT) 22.3-6.8 MG/ML ophthalmic solution INSTILL 1 DROP INTO EACH EYE TWICE DAILY  fluticasone (FLONASE) 50 MCG/ACT nasal spray USE TWO SPRAY(S) IN EACH NOSTRIL ONCE DAILY   FOLIC ACID PO Take by mouth.   gabapentin (NEURONTIN) 300 MG capsule Take 1 capsule (300 mg total) by mouth at bedtime.   InFLIXimab (REMICADE IV) Inject into the vein every 6 (six) weeks. Per Dr Jefm Bryant   latanoprost (XALATAN) 0.005 % ophthalmic solution Place 1 drop into both eyes at bedtime.   lovastatin (MEVACOR) 40 MG tablet TAKE 1 TABLET DAILY   methotrexate 25 MG/ML SOLN Inject 17.5 mg into the skin once a week. Patient takes 17.5 mg (0.7 ml) on Monday or Tuesday of each week.   mupirocin ointment (BACTROBAN) 2 % Apply 1 application topically 2 (two) times daily.   nitrofurantoin, macrocrystal-monohydrate, (MACROBID) 100 MG capsule Take 1 capsule (100 mg total) by mouth 2 (two) times daily.   omeprazole (PRILOSEC) 20 MG capsule Take 1 capsule (20 mg total) by mouth daily.   phenazopyridine (PYRIDIUM) 200 MG tablet Take 1 tablet (200 mg total) by mouth 3 (three) times daily.   polyethylene glycol (MIRALAX / GLYCOLAX) packet Take 17 g by mouth daily.   Probiotic Product (ALIGN) 4 MG CAPS Take one capsule daily   psyllium (METAMUCIL) 58.6 % packet Take 1 packet by mouth daily.   triamcinolone cream (KENALOG) 0.1 % Apply 1 application topically 2 (two) times daily.   triamterene-hydrochlorothiazide (MAXZIDE-25) 37.5-25 MG tablet TAKE 1 TABLET DAILY   [DISCONTINUED] losartan (COZAAR) 100 MG tablet TAKE 1 TABLET DAILY   No facility-administered encounter medications on file as of 05/26/2022.     Lab Results  Component Value Date   WBC 6.2 05/26/2022   HGB 12.2 05/26/2022   HCT 36.9 05/26/2022   PLT 296.0 05/26/2022   GLUCOSE 96 05/26/2022   CHOL 183 05/26/2022   TRIG 146.0 05/26/2022   HDL 70.50 05/26/2022   LDLCALC 83 05/26/2022   ALT  13 05/26/2022   AST 26 05/26/2022   NA 135 05/26/2022   K 4.1 05/26/2022   CL 97 05/26/2022   CREATININE 0.94 05/26/2022   BUN 18 05/26/2022   CO2 30 05/26/2022   TSH 1.55 05/26/2022   INR 1.0 12/03/2020    No results found.     Assessment & Plan:  Hypercholesterolemia Assessment & Plan: Continue lovastatin.  Follow lipid panel and liver function tests.   Orders: -     CBC with Differential/Platelet -     Hepatic function panel -     Lipid panel -     TSH  Essential hypertension Assessment & Plan: On losartan and diltiazem.  Blood pressure elevated.  Will change losartan to diovan.  Would like to avoid having to add another medication.  Follow pressures.  If persistent elevation, increase diovan to '320mg'$  q day.  Follow metabolic panel.   Orders: -     Basic metabolic panel  Need for immunization against influenza -     Flu Vaccine QUAD 7moIM (Fluarix, Fluzone & Alfiuria Quad PF)  Abnormal liver function tests Assessment & Plan: Follow liver panel.    Anemia, unspecified type Assessment & Plan: Follow cbc.    Aortic atherosclerosis ([facility]) Assessment & Plan: Continue lovastatin.   Gastroesophageal reflux disease without esophagitis Assessment & Plan: No upper symptoms reported.  Continue Prilosec.   Meningioma ([facility] Assessment & Plan: MRI Brain without Contrast 04/23/2021: 1.3 cm dural-based mass along the anterior falx, likely reflecting a meningioma. No significant mass effect upon the adjacent frontal lobes. No adjacent parenchymal edema.  Minimal chronic small-vessel ischemic changes within the cerebral white matter. Mild generalized parenchymal atrophy. Mild paranasal sinus disease, as described.  Saw neurology (Dr Manuella Ghazi) -  felt was an incidental finding, no signs and symptoms suggestive of pressure, vasogenic edema. did not feel she needed surveillance scans for now.      MGUS (monoclonal gammopathy of unknown significance) Assessment & Plan: Has  declined f/u with hematology.  Myeloma panel (03/2021) - negative M spike.  Stable.     Pancreas cyst Assessment & Plan: Evaluated by GI 09/10/2020-pancreatic cyst seen on CT-plan MRI follow-up in 1 year. Discussed today.  Declines f/u scan at this time.    Rheumatoid arthritis, involving unspecified site, unspecified whether rheumatoid factor present [facility]) Assessment & Plan: Followed by rheumatology.  Has been receiving Remicade and MTX.    Right knee pain, unspecified chronicity Assessment & Plan: Followed by rheumatology.  Feels bio flex is helping.  Desires no further intervention.  Follow.    Stress Assessment & Plan: She has good support.  Does not feel needs any further intervention.  Follow.    Hoarseness Assessment & Plan: Persistent hoarseness.  Discussed.  No sore throat.  Denies acid reflux.  States controlled on PPI.  Nasacort nasal spray to help with drainage.  Call with update.  Discussed ENT referral.    Other orders -     Valsartan; Take 1 tablet (160 mg total) by mouth daily.  Dispense: 30 tablet; Refill: 1     Einar Pheasant, MD

## 2022-05-26 NOTE — Patient Instructions (Addendum)
Nasacort nasal spray - 2 sprays each nostril one time per day.  Do this prior to going to bed.  Stop losartan  Start diovan '160mg'$  per day.

## 2022-05-27 ENCOUNTER — Encounter: Payer: Self-pay | Admitting: Internal Medicine

## 2022-05-27 DIAGNOSIS — R490 Dysphonia: Secondary | ICD-10-CM | POA: Insufficient documentation

## 2022-05-27 NOTE — Assessment & Plan Note (Signed)
Evaluated by GI 09/10/2020-pancreatic cyst seen on CT-plan MRI follow-up in 1 year. Discussed today.  Declines f/u scan at this time.

## 2022-05-27 NOTE — Assessment & Plan Note (Signed)
Continue lovastatin 

## 2022-05-27 NOTE — Assessment & Plan Note (Signed)
Follow cbc.  

## 2022-05-27 NOTE — Assessment & Plan Note (Signed)
No upper symptoms reported.  Continue Prilosec.

## 2022-05-27 NOTE — Assessment & Plan Note (Signed)
Has declined f/u with hematology.  Myeloma panel (03/2021) - negative M spike.  Stable.

## 2022-05-27 NOTE — Assessment & Plan Note (Signed)
Continue lovastatin.  Follow lipid panel and liver function tests.   

## 2022-05-27 NOTE — Assessment & Plan Note (Signed)
MRI Brain without Contrast 04/23/2021: 1.3 cm dural-based mass along the anterior falx, likely reflecting a meningioma. No significant mass effect upon the adjacent frontal lobes. No adjacent parenchymal edema. Minimal chronic small-vessel ischemic changes within the cerebral white matter. Mild generalized parenchymal atrophy. Mild paranasal sinus disease, as described.  Saw neurology (Dr Manuella Ghazi) -  felt was an incidental finding, no signs and symptoms suggestive of pressure, vasogenic edema. did not feel she needed surveillance scans for now.

## 2022-05-27 NOTE — Assessment & Plan Note (Signed)
Follow liver panel.  

## 2022-05-27 NOTE — Assessment & Plan Note (Signed)
Followed by rheumatology.  Has been receiving Remicade and MTX.

## 2022-05-27 NOTE — Assessment & Plan Note (Signed)
Persistent hoarseness.  Discussed.  No sore throat.  Denies acid reflux.  States controlled on PPI.  Nasacort nasal spray to help with drainage.  Call with update.  Discussed ENT referral.

## 2022-05-27 NOTE — Assessment & Plan Note (Signed)
Followed by rheumatology.  Feels bio flex is helping.  Desires no further intervention.  Follow.

## 2022-05-27 NOTE — Assessment & Plan Note (Signed)
She has good support.  Does not feel needs any further intervention.  Follow.

## 2022-05-27 NOTE — Assessment & Plan Note (Signed)
On losartan and diltiazem.  Blood pressure elevated.  Will change losartan to diovan.  Would like to avoid having to add another medication.  Follow pressures.  If persistent elevation, increase diovan to '320mg'$  q day.  Follow metabolic panel.

## 2022-06-02 ENCOUNTER — Telehealth: Payer: Self-pay | Admitting: Internal Medicine

## 2022-06-02 ENCOUNTER — Other Ambulatory Visit: Payer: Self-pay

## 2022-06-02 DIAGNOSIS — E785 Hyperlipidemia, unspecified: Secondary | ICD-10-CM

## 2022-06-02 MED ORDER — LOVASTATIN 40 MG PO TABS: 40.0000 mg | ORAL_TABLET | Freq: Every day | ORAL | 1 refills | Status: DC

## 2022-06-02 MED ORDER — GABAPENTIN 300 MG PO CAPS: 300.0000 mg | ORAL_CAPSULE | Freq: Every day | ORAL | 1 refills | Status: DC

## 2022-06-02 MED ORDER — DILTIAZEM HCL ER 240 MG PO CP24: 240.0000 mg | ORAL_CAPSULE | Freq: Every day | ORAL | 1 refills | Status: DC

## 2022-06-02 NOTE — Telephone Encounter (Signed)
sent 

## 2022-06-02 NOTE — Telephone Encounter (Signed)
Prescription Request  06/02/2022  Is this a "Controlled Substance" medicine? No  LOV: 05/26/2022  What is the name of the medication or equipment? All medications that patient is currently taking by Dr Nicki Reaper are to be sent to [facility]. Phone [phone]. Almost out of gabapentin (NEURONTIN) 300 MG capsule, lovastatin (MEVACOR) 40 MG tablet, and DILT-XR 240 MG 24 hr capsule  Have you contacted your pharmacy to request a refill? No   Which pharmacy would you like this sent to?    [facility]    Patient notified that their request is being sent to the clinical staff for review and that they should receive a response within 2 business days.   Please advise at [facility] [phone]

## 2022-07-14 ENCOUNTER — Other Ambulatory Visit: Payer: Self-pay | Admitting: Internal Medicine

## 2022-07-14 DIAGNOSIS — M0579 Rheumatoid arthritis with rheumatoid factor of multiple sites without organ or systems involvement: Secondary | ICD-10-CM | POA: Diagnosis not present

## 2022-07-22 ENCOUNTER — Encounter: Payer: Self-pay | Admitting: Internal Medicine

## 2022-07-22 ENCOUNTER — Ambulatory Visit (INDEPENDENT_AMBULATORY_CARE_PROVIDER_SITE_OTHER): Payer: Medicare Other | Admitting: Internal Medicine

## 2022-07-22 VITALS — BP 126/64 | HR 62 | Temp 98.0°F | Resp 16 | Ht 61.0 in | Wt 102.8 lb

## 2022-07-22 DIAGNOSIS — K862 Cyst of pancreas: Secondary | ICD-10-CM | POA: Diagnosis not present

## 2022-07-22 DIAGNOSIS — I10 Essential (primary) hypertension: Secondary | ICD-10-CM | POA: Diagnosis not present

## 2022-07-22 DIAGNOSIS — D649 Anemia, unspecified: Secondary | ICD-10-CM | POA: Diagnosis not present

## 2022-07-22 DIAGNOSIS — E871 Hypo-osmolality and hyponatremia: Secondary | ICD-10-CM | POA: Diagnosis not present

## 2022-07-22 DIAGNOSIS — R252 Cramp and spasm: Secondary | ICD-10-CM | POA: Diagnosis not present

## 2022-07-22 DIAGNOSIS — M069 Rheumatoid arthritis, unspecified: Secondary | ICD-10-CM

## 2022-07-22 DIAGNOSIS — K219 Gastro-esophageal reflux disease without esophagitis: Secondary | ICD-10-CM

## 2022-07-22 DIAGNOSIS — E7800 Pure hypercholesterolemia, unspecified: Secondary | ICD-10-CM

## 2022-07-22 DIAGNOSIS — R5383 Other fatigue: Secondary | ICD-10-CM

## 2022-07-22 DIAGNOSIS — E785 Hyperlipidemia, unspecified: Secondary | ICD-10-CM | POA: Diagnosis not present

## 2022-07-22 DIAGNOSIS — I700 Atherosclerosis of aorta: Secondary | ICD-10-CM | POA: Diagnosis not present

## 2022-07-22 MED ORDER — OMEPRAZOLE 20 MG PO CPDR: 20.0000 mg | DELAYED_RELEASE_CAPSULE | Freq: Every day | ORAL | 3 refills | Status: DC

## 2022-07-22 MED ORDER — DILTIAZEM HCL ER 240 MG PO CP24: 240.0000 mg | ORAL_CAPSULE | Freq: Every day | ORAL | 3 refills | Status: DC

## 2022-07-22 MED ORDER — GABAPENTIN 300 MG PO CAPS: 300.0000 mg | ORAL_CAPSULE | Freq: Every day | ORAL | 3 refills | Status: DC

## 2022-07-22 MED ORDER — LOVASTATIN 40 MG PO TABS: 40.0000 mg | ORAL_TABLET | Freq: Every day | ORAL | 3 refills | Status: DC

## 2022-07-22 MED ORDER — VALSARTAN 160 MG PO TABS: ORAL_TABLET | ORAL | 3 refills | Status: DC

## 2022-07-22 NOTE — Progress Notes (Signed)
Subjective:    Patient ID: Stacey Snyder, female    DOB: [birth year], 89 y.o.   MRN: [id number]  Patient here for  Chief Complaint  Patient presents with   Medical Management of Chronic Issues    HPI Here for scheduled follow up.  Started diovan last visit. Off triam/hctz. Reports she is doing relatively well.  Does report some fatigue.  No chest pain.  Breathing stable.  Does report noticing occasional epigastric pain.  Notices after eating.  Will massage and pass gas.  Bowels stable.  Wearing support socks.  No cramps.    Past Medical History:  Diagnosis Date   Diverticulosis    Fibrocystic breast disease    GERD (gastroesophageal reflux disease)    Glaucoma 12/04/2020   History of kidney stones    Hypercholesterolemia    Hypertension    Hypertension    IBS (irritable bowel syndrome)    Inflammatory arthritis    positive anti-CCP abs, s/p prednisone, MTX, Remicade   Nephrolithiasis    Osteopenia    GI upset with Fosamax   Pancreatitis 2007   s/p ERCP   Pancreatitis    PONV (postoperative nausea and vomiting)    Renal insufficiency    Spinal stenosis of lumbosacral region    Past Surgical History:  Procedure Laterality Date   ABDOMINAL HYSTERECTOMY  1993   ovaries not removed   BREAST BIOPSY Left 1984   negative   BREAST CYST EXCISION Left 2010   negative   COLONOSCOPY N/A 12/05/2020   Procedure: COLONOSCOPY;  Surgeon: Lesly Rubenstein, MD;  Location: [facility] ENDOSCOPY;  Service: Endoscopy;  Laterality: N/A;   LITHOTRIPSY     LUMBAR LAMINECTOMY  1983   LUMBAR LAMINECTOMY/DECOMPRESSION MICRODISCECTOMY N/A 08/18/2016   Procedure: LUMBAR LAMINECTOMY/DECOMPRESSION MICRODISCECTOMY 1 LEVEL;  Surgeon: Bayard Hugger, MD;  Location: [facility] ORS;  Service: Neurosurgery;  Laterality: N/A;  L4-5 Laminectomy, MIS, L5 foraminotomy   TRIGGER FINGER RELEASE     right ring finger   Family History  Problem Relation Age of Onset   Ovarian cancer Mother    Renal Disease Mother     Heart disease Mother    Diabetes Mother    Hypertension Mother    Heart disease Father    Breast cancer Neg Hx    Social History   Socioeconomic History   Marital status: Married    Spouse name: Not on file   Number of children: 4   Years of education: Not on file   Highest education level: Not on file  Occupational History   Not on file  Tobacco Use   Smoking status: Never   Smokeless tobacco: Never  Vaping Use   Vaping Use: Never used  Substance and Sexual Activity   Alcohol use: No    Alcohol/week: 0.0 standard drinks of alcohol   Drug use: No   Sexual activity: Not on file  Other Topics Concern   Not on file  Social History Narrative   Not on file   Social Determinants of Health   Financial Resource Strain: Not on file  Food Insecurity: Not on file  Transportation Needs: Not on file  Physical Activity: Not on file  Stress: Not on file  Social Connections: Not on file     Review of Systems  Constitutional:  Negative for fever.       Has lost some weight.   HENT:  Negative for congestion and sinus pressure.   Respiratory:  Negative for cough, chest  tightness and shortness of breath.   Cardiovascular:  Negative for chest pain and palpitations.  Gastrointestinal:  Negative for nausea and vomiting.       Epigastric pain - intermittent - as outlined.   Genitourinary:  Negative for difficulty urinating and dysuria.  Musculoskeletal:  Negative for joint swelling and myalgias.  Skin:  Negative for color change and rash.  Neurological:  Negative for dizziness and headaches.  Psychiatric/Behavioral:  Negative for agitation and dysphoric mood.        Objective:     BP 126/64   Pulse 62   Temp 98 F (36.7 C)   Resp 16   Ht 5\' 1"  (1.549 m)   Wt 102 lb 12.8 oz (46.6 kg)   SpO2 98%   BMI 19.42 kg/m  Wt Readings from Last 3 Encounters:  07/22/22 102 lb 12.8 oz (46.6 kg)  05/26/22 105 lb (47.6 kg)  05/22/22 104 lb (47.2 kg)    Physical Exam Vitals  reviewed.  Constitutional:      General: She is not in acute distress.    Appearance: Normal appearance.  HENT:     Head: Normocephalic and atraumatic.     Right Ear: External ear normal.     Left Ear: External ear normal.  Eyes:     General: No scleral icterus.       Right eye: No discharge.        Left eye: No discharge.     Conjunctiva/sclera: Conjunctivae normal.  Neck:     Thyroid: No thyromegaly.  Cardiovascular:     Rate and Rhythm: Normal rate and regular rhythm.  Pulmonary:     Effort: No respiratory distress.     Breath sounds: Normal breath sounds. No wheezing.  Abdominal:     General: Bowel sounds are normal.     Palpations: Abdomen is soft.     Tenderness: There is no abdominal tenderness.  Musculoskeletal:        General: No swelling or tenderness.     Cervical back: Neck supple. No tenderness.  Lymphadenopathy:     Cervical: No cervical adenopathy.  Skin:    Findings: No erythema or rash.  Neurological:     Mental Status: She is alert.  Psychiatric:        Mood and Affect: Mood normal.        Behavior: Behavior normal.      Outpatient Encounter Medications as of 07/22/2022  Medication Sig   Cholecalciferol (VITAMIN D) 2000 UNITS tablet Take 2,000 Units by mouth daily.   diltiazem (DILT-XR) 240 MG 24 hr capsule Take 1 capsule (240 mg total) by mouth daily.   dorzolamide-timolol (COSOPT) 22.3-6.8 MG/ML ophthalmic solution INSTILL 1 DROP INTO EACH EYE TWICE DAILY   fluticasone (FLONASE) 50 MCG/ACT nasal spray USE TWO SPRAY(S) IN EACH NOSTRIL ONCE DAILY   FOLIC ACID PO Take by mouth.   gabapentin (NEURONTIN) 300 MG capsule Take 1 capsule (300 mg total) by mouth at bedtime.   InFLIXimab (REMICADE IV) Inject into the vein every 6 (six) weeks. Per Dr Jefm Bryant   latanoprost (XALATAN) 0.005 % ophthalmic solution Place 1 drop into both eyes at bedtime.   lovastatin (MEVACOR) 40 MG tablet Take 1 tablet (40 mg total) by mouth daily.   methotrexate 25 MG/ML SOLN  Inject 17.5 mg into the skin once a week. Patient takes 17.5 mg (0.7 ml) on Monday or Tuesday of each week.   mupirocin ointment (BACTROBAN) 2 % Apply 1 application topically 2 (  two) times daily.   omeprazole (PRILOSEC) 20 MG capsule Take 1 capsule (20 mg total) by mouth daily.   polyethylene glycol (MIRALAX / GLYCOLAX) packet Take 17 g by mouth daily.   Probiotic Product (ALIGN) 4 MG CAPS Take one capsule daily   psyllium (METAMUCIL) 58.6 % packet Take 1 packet by mouth daily.   triamcinolone cream (KENALOG) 0.1 % Apply 1 application topically 2 (two) times daily.   valsartan (DIOVAN) 160 MG tablet TAKE 1 TABLET(160 MG) BY MOUTH DAILY   [DISCONTINUED] cyanocobalamin 1000 MCG tablet Take 1,000 mcg by mouth daily.   [DISCONTINUED] diltiazem (DILT-XR) 240 MG 24 hr capsule Take 1 capsule (240 mg total) by mouth daily.   [DISCONTINUED] gabapentin (NEURONTIN) 300 MG capsule Take 1 capsule (300 mg total) by mouth at bedtime.   [DISCONTINUED] lovastatin (MEVACOR) 40 MG tablet Take 1 tablet (40 mg total) by mouth daily.   [DISCONTINUED] nitrofurantoin, macrocrystal-monohydrate, (MACROBID) 100 MG capsule Take 1 capsule (100 mg total) by mouth 2 (two) times daily.   [DISCONTINUED] omeprazole (PRILOSEC) 20 MG capsule Take 1 capsule (20 mg total) by mouth daily.   [DISCONTINUED] phenazopyridine (PYRIDIUM) 200 MG tablet Take 1 tablet (200 mg total) by mouth 3 (three) times daily.   [DISCONTINUED] triamterene-hydrochlorothiazide (MAXZIDE-25) 37.5-25 MG tablet TAKE 1 TABLET DAILY   [DISCONTINUED] valsartan (DIOVAN) 160 MG tablet TAKE 1 TABLET(160 MG) BY MOUTH DAILY   No facility-administered encounter medications on file as of 07/22/2022.     Lab Results  Component Value Date   WBC 8.0 07/22/2022   HGB 12.6 07/22/2022   HCT 37.6 07/22/2022   PLT 263.0 07/22/2022   GLUCOSE 90 07/22/2022   CHOL 183 05/26/2022   TRIG 146.0 05/26/2022   HDL 70.50 05/26/2022   LDLCALC 83 05/26/2022   ALT 13 07/22/2022    AST 25 07/22/2022   NA 135 07/22/2022   K 3.6 07/22/2022   CL 98 07/22/2022   CREATININE 0.98 07/22/2022   BUN 22 07/22/2022   CO2 28 07/22/2022   TSH 1.55 05/26/2022   INR 1.0 12/03/2020    No results found.     Assessment & Plan:  Hypercholesterolemia Assessment & Plan: Continue lovastatin.  Follow lipid panel and liver function tests.    Hyperlipidemia, unspecified hyperlipidemia type -     Hepatic function panel -     Basic metabolic panel  Gastroesophageal reflux disease  Hyponatremia  Anemia, unspecified type Assessment & Plan: Reports increased fatigue.  Check cbc and B12.    Orders: -     CBC with Differential/Platelet -     Vitamin B12  Aortic atherosclerosis (HCC) Assessment & Plan: Continue lovastatin.   Essential hypertension Assessment & Plan: On diovan and diltiazem.   Blood pressures as outlined.  No changes. Follow metabolic panel.    Other fatigue Assessment & Plan: Reports increased fatigue.  Denies chest pain or sob.  Discussed eating and GI issues.  Check labs, including cbc and metabolic panel.  Check B12.  Discussed further w/up.  Wants to start - labs.  Follow closely.       Gastroesophageal reflux disease without esophagitis Assessment & Plan: No upper symptoms reported.  Continue Prilosec.   Pancreas cyst Assessment & Plan: Evaluated by GI 09/10/2020-pancreatic cyst seen on CT-plan MRI follow-up in 1 year. Have discussed with her regarding f/u scan.  Has declined.     Rheumatoid arthritis, involving unspecified site, unspecified whether rheumatoid factor present [facility]) Assessment & Plan: Followed by rheumatology.  Has  been receiving Remicade and MTX.    Cramps, extremity Assessment & Plan: No cramps since wearing support socks.    Other orders -     dilTIAZem HCl ER; Take 1 capsule (240 mg total) by mouth daily.  Dispense: 90 capsule; Refill: 3 -     Gabapentin; Take 1 capsule (300 mg total) by mouth at bedtime.  Dispense:  90 capsule; Refill: 3 -     Lovastatin; Take 1 tablet (40 mg total) by mouth daily.  Dispense: 90 tablet; Refill: 3 -     Omeprazole; Take 1 capsule (20 mg total) by mouth daily.  Dispense: 90 capsule; Refill: 3 -     Valsartan; TAKE 1 TABLET(160 MG) BY MOUTH DAILY  Dispense: 90 tablet; Refill: 3     Einar Pheasant, MD

## 2022-07-23 LAB — CBC WITH DIFFERENTIAL/PLATELET
Basophils Absolute: 0 10*3/uL (ref 0.0–0.1)
Basophils Relative: 0.3 % (ref 0.0–3.0)
Eosinophils Absolute: 0.3 10*3/uL (ref 0.0–0.7)
Eosinophils Relative: 3.1 % (ref 0.0–5.0)
HCT: 37.6 % (ref 36.0–46.0)
Hemoglobin: 12.6 g/dL (ref 12.0–15.0)
Lymphocytes Relative: 37.5 % (ref 12.0–46.0)
Lymphs Abs: 3 10*3/uL (ref 0.7–4.0)
MCHC: 33.5 g/dL (ref 30.0–36.0)
MCV: 93.5 fl (ref 78.0–100.0)
Monocytes Absolute: 0.8 10*3/uL (ref 0.1–1.0)
Monocytes Relative: 9.8 % (ref 3.0–12.0)
Neutro Abs: 3.9 10*3/uL (ref 1.4–7.7)
Neutrophils Relative %: 49.3 % (ref 43.0–77.0)
Platelets: 263 10*3/uL (ref 150.0–400.0)
RBC: 4.02 Mil/uL (ref 3.87–5.11)
RDW: 14.4 % (ref 11.5–15.5)
WBC: 8 10*3/uL (ref 4.0–10.5)

## 2022-07-23 LAB — BASIC METABOLIC PANEL
BUN: 22 mg/dL (ref 6–23)
CO2: 28 mEq/L (ref 19–32)
Calcium: 9.4 mg/dL (ref 8.4–10.5)
Chloride: 98 mEq/L (ref 96–112)
Creatinine, Ser: 0.98 mg/dL (ref 0.40–1.20)
GFR: 51.98 mL/min — ABNORMAL LOW (ref >60.00)
Glucose, Bld: 90 mg/dL (ref 70–99)
Potassium: 3.6 mEq/L (ref 3.5–5.1)
Sodium: 135 mEq/L (ref 135–145)

## 2022-07-23 LAB — HEPATIC FUNCTION PANEL
ALT: 13 U/L (ref 0–35)
AST: 25 U/L (ref 0–37)
Albumin: 4 g/dL (ref 3.5–5.2)
Alkaline Phosphatase: 91 U/L (ref 39–117)
Bilirubin, Direct: 0.1 mg/dL (ref 0.0–0.3)
Total Bilirubin: 0.4 mg/dL (ref 0.2–1.2)
Total Protein: 7.2 g/dL (ref 6.0–8.3)

## 2022-07-23 LAB — VITAMIN B12: Vitamin B-12: 576 pg/mL (ref 211–911)

## 2022-07-24 ENCOUNTER — Telehealth: Payer: Self-pay

## 2022-07-24 NOTE — Telephone Encounter (Signed)
-----   Message from Einar Pheasant, MD sent at 07/23/2022 10:31 PM EDT ----- Notify - kidney function is stable.  B12 level, hemoglobin and liver function tests are wnl.

## 2022-07-24 NOTE — Telephone Encounter (Signed)
Noted  

## 2022-07-24 NOTE — Telephone Encounter (Signed)
LMTCB

## 2022-07-24 NOTE — Telephone Encounter (Signed)
Pt returned [place] LPN call. Unable to transfer. Note below from Dr.Scott was read to pt. She's aware and understood.

## 2022-07-27 ENCOUNTER — Encounter: Payer: Self-pay | Admitting: Internal Medicine

## 2022-07-27 NOTE — Assessment & Plan Note (Signed)
Followed by rheumatology.  Has been receiving Remicade and MTX.  ?

## 2022-07-27 NOTE — Assessment & Plan Note (Signed)
Reports increased fatigue.  Check cbc and B12.

## 2022-07-27 NOTE — Assessment & Plan Note (Signed)
No cramps since wearing support socks.

## 2022-07-27 NOTE — Assessment & Plan Note (Addendum)
Reports increased fatigue.  Denies chest pain or sob.  Discussed eating and GI issues.  Check labs, including cbc and metabolic panel.  Check B12.  Discussed further w/up.  Wants to start - labs.  Follow closely.

## 2022-07-27 NOTE — Assessment & Plan Note (Signed)
Continue lovastatin.  Follow lipid panel and liver function tests.   

## 2022-07-27 NOTE — Assessment & Plan Note (Signed)
Evaluated by GI 09/10/2020-pancreatic cyst seen on CT-plan MRI follow-up in 1 year. Have discussed with her regarding f/u scan.  Has declined.

## 2022-07-27 NOTE — Assessment & Plan Note (Signed)
Continue lovastatin 

## 2022-07-27 NOTE — Assessment & Plan Note (Signed)
No upper symptoms reported.  Continue Prilosec. 

## 2022-07-27 NOTE — Assessment & Plan Note (Signed)
On diovan and diltiazem.   Blood pressures as outlined.  No changes. Follow metabolic panel.

## 2022-09-01 DIAGNOSIS — M0579 Rheumatoid arthritis with rheumatoid factor of multiple sites without organ or systems involvement: Secondary | ICD-10-CM | POA: Diagnosis not present

## 2022-10-02 ENCOUNTER — Ambulatory Visit: Payer: Medicare Other | Admitting: Internal Medicine

## 2022-10-09 ENCOUNTER — Encounter: Payer: Self-pay | Admitting: Internal Medicine

## 2022-10-09 ENCOUNTER — Ambulatory Visit (INDEPENDENT_AMBULATORY_CARE_PROVIDER_SITE_OTHER): Payer: Medicare Other | Admitting: Internal Medicine

## 2022-10-09 VITALS — BP 130/76 | HR 62 | Temp 97.9°F | Resp 16 | Ht 61.0 in | Wt 102.0 lb

## 2022-10-09 DIAGNOSIS — D472 Monoclonal gammopathy: Secondary | ICD-10-CM | POA: Diagnosis not present

## 2022-10-09 DIAGNOSIS — D329 Benign neoplasm of meninges, unspecified: Secondary | ICD-10-CM | POA: Diagnosis not present

## 2022-10-09 DIAGNOSIS — R200 Anesthesia of skin: Secondary | ICD-10-CM

## 2022-10-09 DIAGNOSIS — I700 Atherosclerosis of aorta: Secondary | ICD-10-CM

## 2022-10-09 DIAGNOSIS — H43813 Vitreous degeneration, bilateral: Secondary | ICD-10-CM | POA: Diagnosis not present

## 2022-10-09 DIAGNOSIS — K862 Cyst of pancreas: Secondary | ICD-10-CM | POA: Diagnosis not present

## 2022-10-09 DIAGNOSIS — E7800 Pure hypercholesterolemia, unspecified: Secondary | ICD-10-CM | POA: Diagnosis not present

## 2022-10-09 DIAGNOSIS — I10 Essential (primary) hypertension: Secondary | ICD-10-CM

## 2022-10-09 DIAGNOSIS — M069 Rheumatoid arthritis, unspecified: Secondary | ICD-10-CM | POA: Diagnosis not present

## 2022-10-09 DIAGNOSIS — H4043X4 Glaucoma secondary to eye inflammation, bilateral, indeterminate stage: Secondary | ICD-10-CM | POA: Diagnosis not present

## 2022-10-09 DIAGNOSIS — H353131 Nonexudative age-related macular degeneration, bilateral, early dry stage: Secondary | ICD-10-CM | POA: Diagnosis not present

## 2022-10-09 DIAGNOSIS — M3501 Sicca syndrome with keratoconjunctivitis: Secondary | ICD-10-CM | POA: Diagnosis not present

## 2022-10-09 NOTE — Progress Notes (Signed)
Subjective:    Patient ID: Stacey Snyder, female    DOB: 05-15-1932, 89 y.o.   MRN: [id number]  Patient here for  Chief Complaint  Patient presents with   Medical Management of Chronic Issues    HPI Here to follow up regarding hypercholesterolemia and hypertension.  On diovan.  Off triam/hctz.  Continues diltiazem.  Has RA - receiving remicade and MTX. Feels joints stable.  Stays active.  No chest pain or sob reported.  No cough or congestion.  No abdominal pain.  Feels bowels are stable.  Discussed F/u pancreatic cyst. Wants to hold.  Wearing compression hose/socks.  Helping swelling.  Does report that she has noticed her right hand - numb if she is lying on her right side.  Once she moves her arm, resolves.  No problems during the day.  No weakness reported.   Past Medical History:  Diagnosis Date   Diverticulosis    Fibrocystic breast disease    GERD (gastroesophageal reflux disease)    Glaucoma 12/04/2020   History of kidney stones    Hypercholesterolemia    Hypertension    Hypertension    IBS (irritable bowel syndrome)    Inflammatory arthritis    positive anti-CCP abs, s/p prednisone, MTX, Remicade   Nephrolithiasis    Osteopenia    GI upset with Fosamax   Pancreatitis 2007   s/p ERCP   Pancreatitis    PONV (postoperative nausea and vomiting)    Renal insufficiency    Spinal stenosis of lumbosacral region    Past Surgical History:  Procedure Laterality Date   ABDOMINAL HYSTERECTOMY  1993   ovaries not removed   BREAST BIOPSY Left 1984   negative   BREAST CYST EXCISION Left 2010   negative   COLONOSCOPY N/A 12/05/2020   Procedure: COLONOSCOPY;  Surgeon: Regis Bill, MD;  Location: [facility] ENDOSCOPY;  Service: Endoscopy;  Laterality: N/A;   LITHOTRIPSY     LUMBAR LAMINECTOMY  1983   LUMBAR LAMINECTOMY/DECOMPRESSION MICRODISCECTOMY N/A 08/18/2016   Procedure: LUMBAR LAMINECTOMY/DECOMPRESSION MICRODISCECTOMY 1 LEVEL;  Surgeon: Keith Rake, MD;  Location:  [facility] ORS;  Service: Neurosurgery;  Laterality: N/A;  L4-5 Laminectomy, MIS, L5 foraminotomy   TRIGGER FINGER RELEASE     right ring finger   Family History  Problem Relation Age of Onset   Ovarian cancer Mother    Renal Disease Mother    Heart disease Mother    Diabetes Mother    Hypertension Mother    Heart disease Father    Breast cancer Neg Hx    Social History   Socioeconomic History   Marital status: Married    Spouse name: Not on file   Number of children: 4   Years of education: Not on file   Highest education level: Not on file  Occupational History   Not on file  Tobacco Use   Smoking status: Never   Smokeless tobacco: Never  Vaping Use   Vaping Use: Never used  Substance and Sexual Activity   Alcohol use: No    Alcohol/week: 0.0 standard drinks of alcohol   Drug use: No   Sexual activity: Not on file  Other Topics Concern   Not on file  Social History Narrative   Not on file   Social Determinants of Health   Financial Resource Strain: Not on file  Food Insecurity: Not on file  Transportation Needs: Not on file  Physical Activity: Not on file  Stress: Not on file  Social Connections: Not on file     Review of Systems  Constitutional:  Negative for appetite change and unexpected weight change.  HENT:  Negative for congestion and sinus pressure.   Respiratory:  Negative for cough, chest tightness and shortness of breath.   Cardiovascular:  Negative for chest pain and palpitations.       No increased swelling.    Gastrointestinal:  Negative for abdominal pain, diarrhea, nausea and vomiting.  Genitourinary:  Negative for difficulty urinating and dysuria.  Musculoskeletal:  Negative for joint swelling and myalgias.  Skin:  Negative for color change and rash.  Neurological:  Negative for dizziness and headaches.  Psychiatric/Behavioral:  Negative for agitation and dysphoric mood.        Objective:     BP 130/76   Pulse 62   Temp 97.9 F (36.6  C)   Resp 16   Ht 5\' 1"  (1.549 m)   Wt 102 lb (46.3 kg)   SpO2 98%   BMI 19.27 kg/m  Wt Readings from Last 3 Encounters:  10/09/22 102 lb (46.3 kg)  07/22/22 102 lb 12.8 oz (46.6 kg)  05/26/22 105 lb (47.6 kg)    Physical Exam Vitals reviewed.  Constitutional:      General: She is not in acute distress.    Appearance: Normal appearance.  HENT:     Head: Normocephalic and atraumatic.     Right Ear: External ear normal.     Left Ear: External ear normal.  Eyes:     General: No scleral icterus.       Right eye: No discharge.        Left eye: No discharge.     Conjunctiva/sclera: Conjunctivae normal.  Neck:     Thyroid: No thyromegaly.  Cardiovascular:     Rate and Rhythm: Normal rate and regular rhythm.  Pulmonary:     Effort: No respiratory distress.     Breath sounds: Normal breath sounds. No wheezing.  Abdominal:     General: Bowel sounds are normal.     Palpations: Abdomen is soft.     Tenderness: There is no abdominal tenderness.  Musculoskeletal:        General: No swelling or tenderness.     Cervical back: Neck supple. No tenderness.  Lymphadenopathy:     Cervical: No cervical adenopathy.  Skin:    Findings: No erythema or rash.  Neurological:     Mental Status: She is alert.  Psychiatric:        Mood and Affect: Mood normal.        Behavior: Behavior normal.      Outpatient Encounter Medications as of 10/09/2022  Medication Sig   folic acid (FOLVITE) 1 MG tablet Take 1 mg by mouth daily.   Cholecalciferol (VITAMIN D) 2000 UNITS tablet Take 2,000 Units by mouth daily.   diltiazem (DILT-XR) 240 MG 24 hr capsule Take 1 capsule (240 mg total) by mouth daily.   dorzolamide-timolol (COSOPT) 22.3-6.8 MG/ML ophthalmic solution INSTILL 1 DROP INTO EACH EYE TWICE DAILY   fluticasone (FLONASE) 50 MCG/ACT nasal spray USE TWO SPRAY(S) IN EACH NOSTRIL ONCE DAILY   gabapentin (NEURONTIN) 300 MG capsule Take 1 capsule (300 mg total) by mouth at bedtime.   InFLIXimab  (REMICADE IV) Inject into the vein every 6 (six) weeks. Per Dr Gavin Potters   latanoprost (XALATAN) 0.005 % ophthalmic solution Place 1 drop into both eyes at bedtime.   lovastatin (MEVACOR) 40 MG tablet Take 1 tablet (40 mg  total) by mouth daily.   methotrexate 25 MG/ML SOLN Inject 17.5 mg into the skin once a week. Patient takes 17.5 mg (0.7 ml) on Monday or Tuesday of each week.   mupirocin ointment (BACTROBAN) 2 % Apply 1 application topically 2 (two) times daily.   omeprazole (PRILOSEC) 20 MG capsule Take 1 capsule (20 mg total) by mouth daily.   polyethylene glycol (MIRALAX / GLYCOLAX) packet Take 17 g by mouth daily.   Probiotic Product (ALIGN) 4 MG CAPS Take one capsule daily   psyllium (METAMUCIL) 58.6 % packet Take 1 packet by mouth daily.   triamcinolone cream (KENALOG) 0.1 % Apply 1 application topically 2 (two) times daily.   valsartan (DIOVAN) 160 MG tablet TAKE 1 TABLET(160 MG) BY MOUTH DAILY   [DISCONTINUED] FOLIC ACID PO Take by mouth.   No facility-administered encounter medications on file as of 10/09/2022.     Lab Results  Component Value Date   WBC 8.0 07/22/2022   HGB 12.6 07/22/2022   HCT 37.6 07/22/2022   PLT 263.0 07/22/2022   GLUCOSE 90 07/22/2022   CHOL 183 05/26/2022   TRIG 146.0 05/26/2022   HDL 70.50 05/26/2022   LDLCALC 83 05/26/2022   ALT 13 07/22/2022   AST 25 07/22/2022   NA 135 07/22/2022   K 3.6 07/22/2022   CL 98 07/22/2022   CREATININE 0.98 07/22/2022   BUN 22 07/22/2022   CO2 28 07/22/2022   TSH 1.55 05/26/2022   INR 1.0 12/03/2020    No results found.     Assessment & Plan:  Hypercholesterolemia Assessment & Plan: Continue lovastatin.  Follow lipid panel and liver function tests.   Orders: -     Lipid panel; Future -     Hepatic function panel; Future -     Basic metabolic panel; Future  Rheumatoid arthritis, involving unspecified site, unspecified whether rheumatoid factor present [facility]) Assessment & Plan: Followed by  rheumatology.  Has been receiving Remicade and MTX.   Orders: -     CBC with Differential/Platelet; Future  Aortic atherosclerosis (HCC) Assessment & Plan: Continue lovastatin.   Essential hypertension Assessment & Plan: On diovan and diltiazem.   Blood pressures as outlined.  No changes. Follow metabolic panel.    Meningioma [facility]) Assessment & Plan: MRI Brain without Contrast 04/23/2021: 1.3 cm dural-based mass along the anterior falx, likely reflecting a meningioma. No significant mass effect upon the adjacent frontal lobes. No adjacent parenchymal edema. Minimal chronic small-vessel ischemic changes within the cerebral white matter. Mild generalized parenchymal atrophy. Mild paranasal sinus disease, as described.  Saw neurology (Dr Sherryll Burger) -  felt was an incidental finding, no signs and symptoms suggestive of pressure, vasogenic edema. did not feel she needed surveillance scans for now.      MGUS (monoclonal gammopathy of unknown significance) Assessment & Plan: Has declined f/u with hematology.  Myeloma panel (03/2021) - negative M spike.  Stable.     Pancreas cyst Assessment & Plan: Evaluated by GI 09/10/2020-pancreatic cyst seen on CT-plan MRI follow-up in 1 year. Have discussed with her regarding f/u scan.  Discussed again today.  Continues to decline.     Hand numbness Assessment & Plan: Occurs when lying down as outlined.  Once up and moving/shakes out - ok.  Discussed trial - wrist splint.  Follow.        Dale [place], MD

## 2022-10-11 ENCOUNTER — Encounter: Payer: Self-pay | Admitting: Internal Medicine

## 2022-10-11 DIAGNOSIS — R200 Anesthesia of skin: Secondary | ICD-10-CM | POA: Insufficient documentation

## 2022-10-11 NOTE — Assessment & Plan Note (Signed)
Has declined f/u with hematology.  Myeloma panel (03/2021) - negative M spike.  Stable.   ?

## 2022-10-11 NOTE — Assessment & Plan Note (Signed)
Continue lovastatin.  Follow lipid panel and liver function tests.   

## 2022-10-11 NOTE — Assessment & Plan Note (Signed)
On diovan and diltiazem.   Blood pressures as outlined.  No changes. Follow metabolic panel.  

## 2022-10-11 NOTE — Assessment & Plan Note (Signed)
Followed by rheumatology.  Has been receiving Remicade and MTX.  ?

## 2022-10-11 NOTE — Assessment & Plan Note (Signed)
MRI Brain without Contrast 04/23/2021: 1.3 cm dural-based mass along the anterior falx, likely reflecting a meningioma. No significant mass effect upon the adjacent frontal lobes. No adjacent parenchymal edema. Minimal chronic small-vessel ischemic changes within the cerebral white matter. Mild generalized parenchymal atrophy. Mild paranasal sinus disease, as described.  Saw neurology (Dr Shah) -  felt was an incidental finding, no signs and symptoms suggestive of pressure, vasogenic edema. did not feel she needed surveillance scans for now.    

## 2022-10-11 NOTE — Assessment & Plan Note (Signed)
Continue lovastatin 

## 2022-10-11 NOTE — Assessment & Plan Note (Signed)
Occurs when lying down as outlined.  Once up and moving/shakes out - ok.  Discussed trial - wrist splint.  Follow.

## 2022-10-11 NOTE — Assessment & Plan Note (Signed)
Evaluated by GI 09/10/2020-pancreatic cyst seen on CT-plan MRI follow-up in 1 year. Have discussed with her regarding f/u scan.  Discussed again today.  Continues to decline.

## 2022-10-27 DIAGNOSIS — M0579 Rheumatoid arthritis with rheumatoid factor of multiple sites without organ or systems involvement: Secondary | ICD-10-CM | POA: Diagnosis not present

## 2022-11-10 ENCOUNTER — Telehealth: Payer: Self-pay

## 2022-11-10 NOTE — Telephone Encounter (Signed)
FYI for you- patient confirmed did not hit head, LOC etc. She says the only thing she hurt was her knee. She also stated that she is able to get up and do as she normally would. She said that she was in a lot of pain for the first week or so but has started to improve. She is ok when in bed or sitting but when she is doing a lot of walking or standing she does start to hurt. Offered appt- patient declined. She said that if symptoms do not resolve she is going to go see ortho and would call if she needs anything.

## 2022-11-10 NOTE — Telephone Encounter (Signed)
Patient's daughter, Evans Lance, called to state patient fell two weeks ago (10/27/2022).  Misty Stanley states patient messed up her arm, legs and lower back.  Misty Stanley states patient has been staying in the bed.  Misty Stanley states patient is fine as long as she is laying in the bed, but she has trouble with pain while sitting or walking.  Misty Stanley states she would like for patient to be seen.  Patient does not have a DPR on file, so I cannot schedule appointment through patient's daughter.  I let Misty Stanley know that I will send message back to Dr. Dale [place] and I will reach out to patient to see if she is willing to schedule an appointment.  I called patient and patient states she stumbled over her dog's mat and she fell and hurt her left knee.  Patient states it was swollen.  Patient states she wrapped it with a stretch bandage and she would prop it up at night.  Patient states her knee has started feeling better after resting it.  Patient states she thinks she will be alright.  Patient states she still has tenderness in the tendons, but it's better.
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
## 2022-11-11 NOTE — Telephone Encounter (Signed)
Agree if continued issues she needs evaluation.

## 2022-11-11 NOTE — Telephone Encounter (Signed)
Pt is aware.  

## 2022-11-19 DIAGNOSIS — M79605 Pain in left leg: Secondary | ICD-10-CM | POA: Diagnosis not present

## 2022-11-19 DIAGNOSIS — M1712 Unilateral primary osteoarthritis, left knee: Secondary | ICD-10-CM | POA: Diagnosis not present

## 2022-11-19 DIAGNOSIS — M7062 Trochanteric bursitis, left hip: Secondary | ICD-10-CM | POA: Diagnosis not present

## 2022-12-15 ENCOUNTER — Telehealth: Payer: Self-pay | Admitting: Internal Medicine

## 2022-12-15 NOTE — Telephone Encounter (Signed)
Patient is aware of below. 

## 2022-12-15 NOTE — Telephone Encounter (Signed)
Pt would like to be called regarding her legs, weakness and it being hard for her to walk

## 2022-12-15 NOTE — Telephone Encounter (Signed)
Reviewed.  Agree with notifying ortho of persistent pain.  Ok to take 2 tylenol (if tolerates tylenol).  If persistent increased pain, needs to be reevaluated.

## 2022-12-15 NOTE — Telephone Encounter (Signed)
Patient was calling to let you know that the pain in her legs is still persistent and getting worse. She has seen Horris Latino and confirmed nothing broke- diagnosed with bursitis in her hip. She is going to see Venetia Night 8/22. Lance prescribed tizanidine and she did not tolerate. She is using tylenol extra strength but only takes 1 at a time. (Directions says to take 2 but she was not sure if that was ok for her) She is having a hard time walking- having to use a walker. She cannot take pain medication but mentioned a prednisone taper. She is going to call them about getting something different to help with the pain. Mentioned getting MRI done with Dr Myer Haff. This was more of just an update for you. Advised patient that Ortho should be able to send her in something different for the pain since they recently saw her for this.

## 2022-12-18 ENCOUNTER — Encounter (INDEPENDENT_AMBULATORY_CARE_PROVIDER_SITE_OTHER): Payer: Self-pay

## 2022-12-19 NOTE — Progress Notes (Unsigned)
Referring Physician:  No referring provider defined for this encounter.  Primary Physician:  Stacey Snyder , MD  History of Present Illness: 12/25/2022 [name] has a history of GERD, glaucoma, hypercholesterolemia, hyperlipidemia, HTN, and RA.   History of lumbar surgery years ago with Dr. Teola Snyder. Last seen by Dr. Myer Snyder in 2022 and she did well with injections.   She had a fall 2 months ago and she now has constant LBP with bilateral lateral leg pain to her feet. She has shooting pain and spasms. Pain is worse with walking, bending, and going from sitting to standing.   She had left GTB injection in July with some relief.   Bowel/Bladder Dysfunction: none  Conservative measures:  Physical therapy: none Multimodal medical therapy including regular antiinflammatories: neurontin, remicade infusions Injections:  Right L2-L3 and L4-L5 ESI on 10/09/20   Past Surgery:  L4-5 Laminectomy in 2018 by Dr. Teola Snyder   The symptoms are causing a significant impact on the patient's life.   Review of Systems:  A 10 point review of systems is negative, except for the pertinent positives and negatives detailed in the HPI.  Past Medical History: Past Medical History:  Diagnosis Date   Diverticulosis    Fibrocystic breast disease    GERD (gastroesophageal reflux disease)    Glaucoma 12/04/2020   History of kidney stones    Hypercholesterolemia    Hypertension    Hypertension    IBS (irritable bowel syndrome)    Inflammatory arthritis    positive anti-CCP abs, s/p prednisone, MTX, Remicade   Nephrolithiasis    Osteopenia    GI upset with Fosamax   Pancreatitis 2007   s/p ERCP   Pancreatitis    PONV (postoperative nausea and vomiting)    Renal insufficiency    Spinal stenosis of lumbosacral region     Past Surgical History: Past Surgical History:  Procedure Laterality Date   ABDOMINAL HYSTERECTOMY  1993   ovaries not removed   BREAST BIOPSY Left 1984   negative    BREAST CYST EXCISION Left 2010   negative   COLONOSCOPY N/A 12/05/2020   Procedure: COLONOSCOPY;  Surgeon: Stacey Bill, MD;  Location: [facility];  Service: Endoscopy;  Laterality: N/A;   LITHOTRIPSY     LUMBAR LAMINECTOMY  1983   LUMBAR LAMINECTOMY/DECOMPRESSION MICRODISCECTOMY N/A 08/18/2016   Procedure: LUMBAR LAMINECTOMY/DECOMPRESSION MICRODISCECTOMY 1 LEVEL;  Surgeon: Stacey Rake, MD;  Location: [facility] OR[facility];  Service: Neurosurgery;  Laterality: N/A;  L4-5 Laminectomy, MIS, L5 foraminotomy   TRIGGER FINGER RELEASE     right ring finger    Allergies: Allergies as of 12/25/2022 - Review Complete 12/25/2022  Allergen Reaction Noted   Carisoprodol Hives 08/26/2016   Metronidazole Swelling, Other (See Comments), and Rash 03/30/2012   Codeine Nausea Only 03/30/2012   Nsaids  08/18/2016   Skelaxin [metaxalone] Swelling 03/30/2012   Tenormin [atenolol] Other (See Comments) 03/30/2012   Tramadol Nausea And Vomiting 11/19/2015    Medications: Outpatient Encounter Medications as of 12/25/2022  Medication Sig   Cholecalciferol (VITAMIN D) 2000 UNITS tablet Take 2,000 Units by mouth daily.   diltiazem (DILT-XR) 240 MG 24 hr capsule Take 1 capsule (240 mg total) by mouth daily.   dorzolamide-timolol (COSOPT) 22.3-6.8 MG/ML ophthalmic solution INSTILL 1 DROP INTO EACH EYE TWICE DAILY   fluticasone (FLONASE) 50 MCG/ACT nasal spray USE TWO SPRAY(S) IN EACH NOSTRIL ONCE DAILY   folic acid (FOLVITE) 1 MG tablet Take 1 mg by mouth daily.   gabapentin (NEURONTIN)  300 MG capsule Take 1 capsule (300 mg total) by mouth at bedtime.   InFLIXimab (REMICADE IV) Inject into the vein every 6 (six) weeks. Per Dr Stacey Snyder   latanoprost (XALATAN) 0.005 % ophthalmic solution Place 1 drop into both eyes at bedtime.   lovastatin (MEVACOR) 40 MG tablet Take 1 tablet (40 mg total) by mouth daily.   methotrexate 25 MG/ML SOLN Inject 17.5 mg into the skin once a week. Patient takes 17.5 mg (0.7 ml) on Monday or  Tuesday of each week.   mupirocin ointment (BACTROBAN) 2 % Apply 1 application topically 2 (two) times daily.   omeprazole (PRILOSEC) 20 MG capsule Take 1 capsule (20 mg total) by mouth daily.   Probiotic Product (ALIGN) 4 MG CAPS Take one capsule daily   psyllium (METAMUCIL) 58.6 % packet Take 1 packet by mouth daily.   valsartan (DIOVAN) 160 MG tablet TAKE 1 TABLET(160 MG) BY MOUTH DAILY   [DISCONTINUED] polyethylene glycol (MIRALAX / GLYCOLAX) packet Take 17 g by mouth daily.   [DISCONTINUED] triamcinolone cream (KENALOG) 0.1 % Apply 1 application topically 2 (two) times daily.   No facility-administered encounter medications on file as of 12/25/2022.    Social History: Social History   Tobacco Use   Smoking status: Never   Smokeless tobacco: Never  Vaping Use   Vaping status: Never Used  Substance Use Topics   Alcohol use: No    Alcohol/week: 0.0 standard drinks of alcohol   Drug use: No    Family Medical History: Family History  Problem Relation Age of Onset   Ovarian cancer Mother    Renal Disease Mother    Heart disease Mother    Diabetes Mother    Hypertension Mother    Heart disease Father    Breast cancer Neg Hx     Physical Examination: Vitals:   12/25/22 1013  BP: 130/74    General: Patient is well developed, well nourished, calm, collected, and in no apparent distress. Attention to examination is appropriate.  Respiratory: Patient is breathing without any difficulty.   NEUROLOGICAL:     Awake, alert, oriented to person, place, and time.  Speech is clear and fluent. Fund of knowledge is appropriate.   Cranial Nerves: Pupils equal round and reactive to light.  Facial tone is symmetric.    No posterior lumbar tenderness.   No abnormal lesions on exposed skin.   Strength:  Side Iliopsoas Quads Hamstring PF DF EHL  R 5 5 5 5 5 5   L 5 5 5 5 5 5    Reflexes are 2+ and symmetric at the patella and achilles.    Clonus is not present.   Bilateral  lower extremity sensation is intact to light touch.     She has slow gait. Ambulates with a cane.   Medical Decision Making  Imaging: MRI of lumbar spine dated 09/01/20:  FINDINGS: Segmentation:  5 lumbar type vertebral bodies.   Alignment: 2 mm retrolisthesis L1-2. 2 mm anterolisthesis L2-3. 4 mm anterolisthesis L3-4.   Vertebrae:  No fracture or primary bone lesion.   Conus medullaris and cauda equina: Conus extends to the T12-L1 level. Conus and cauda equina appear normal.   Paraspinal and other soft tissues: Negative   Disc levels:   T12-L1: Normal   L1-2: 2 mm retrolisthesis. Shallow disc herniation with upward migration behind the inferior aspect of L1. This is new since 2018. This does not appear to cause distinct neural compression.   L2-3: 2 mm anterolisthesis.  Moderate disc bulge. Facet and ligamentous hypertrophy. Multifactorial spinal stenosis at this level that could cause neural compression on either or both sides. Stenosis has worsened since 2018.   L3-4: Right hemilaminectomy and facetectomy. 4 mm degenerative anterolisthesis. Shallow disc protrusion with biforaminal extension right more than left. Facet degeneration and hypertrophy on the left. Stenosis of the subarticular lateral recess on the left that could cause neural compression. Bilateral foraminal stenosis that could cause neural compression. All these findings are worsened since 2018.   L4-5: Previous hemilaminectomy on the right. Disc degeneration with shallow protrusion. Slight caudal down turning. Mild stenosis of the lateral recesses and foramina but without distinct neural compression. Laminectomy is newly performed since the previous exam.   L5-S1: Normal appearance of the disc. Mild facet osteoarthritis. No stenosis.   IMPRESSION: L1-2: New shallow central disc herniation with upward migration. This indents the thecal sac slightly but does not appear to cause neural compression.    L2-3: Multifactorial spinal stenosis, worsened since 2018, due to bulging of the disc in combination with facet ligamentous hypertrophy. This could be symptomatic.   L3-4: Previous right hemilaminectomy and facetectomy. 4 mm degenerative anterolisthesis. Protrusion of the disc with biforaminal extension right more than left. Pronounced facet hypertrophy on the left. Stenosis could occur in the left lateral recess and in both neural foramina. Findings are worsened since 2018.   L4-5: Interval right hemilaminectomy. Shallow protrusion of the disc with slight caudal down turning. Mild narrowing of the lateral recesses and foramina but without likely neural compression.     Electronically Signed   By: Paulina Fusi M.D.   On: 09/02/2020 19:53  I have personally reviewed the images and agree with the above interpretation.  Assessment and Plan: Ms. Malwitz is a pleasant 89 y.o. female has history of lumbar surgery years ago with Dr. Teola Snyder. Last seen by Dr. Myer Snyder in 2022 and she did well with injections.   She had a fall 2 months ago and she now has constant LBP with bilateral lateral leg pain to her feet. She has shooting pain and spasms. Pain is worse with walking, bending, and going from sitting to standing.   MRI from 2022 shows slip at L2-L3 with moderate/severe central stenosis and bilateral foraminal stenosis L3-L4.   Treatment options discussed with patient and following plan made:    - MRI of lumbar spine to further evaluate lumbar radiculopathy. She is worse than when she had previous flare up.  - She is interested in surgery options. We discussed she would need to do PT prior to this. - We also discussed possible injections with Dr. Yves Dill.  - Will review MRI results with Dr. Myer Snyder at her request and schedule phone visit as soon as possible to review.  - She is very limited in her activity due to pain.   I spent a total of 30 minutes in face-to-face and  non-face-to-face activities related to this patient's care today including review of outside records, review of imaging, review of symptoms, physical exam, discussion of differential diagnosis, discussion of treatment options, and documentation.   Thank you for involving me in the care of this patient.   Drake Leach PA-C Dept. of Neurosurgery

## 2022-12-22 DIAGNOSIS — M0579 Rheumatoid arthritis with rheumatoid factor of multiple sites without organ or systems involvement: Secondary | ICD-10-CM | POA: Diagnosis not present

## 2022-12-25 ENCOUNTER — Ambulatory Visit (INDEPENDENT_AMBULATORY_CARE_PROVIDER_SITE_OTHER): Payer: Medicare Other | Admitting: Orthopedic Surgery

## 2022-12-25 ENCOUNTER — Encounter: Payer: Self-pay | Admitting: Orthopedic Surgery

## 2022-12-25 VITALS — BP 130/74 | Ht 61.0 in | Wt 96.2 lb

## 2022-12-25 DIAGNOSIS — M4726 Other spondylosis with radiculopathy, lumbar region: Secondary | ICD-10-CM

## 2022-12-25 DIAGNOSIS — W19XXXA Unspecified fall, initial encounter: Secondary | ICD-10-CM | POA: Diagnosis not present

## 2022-12-25 DIAGNOSIS — M47816 Spondylosis without myelopathy or radiculopathy, lumbar region: Secondary | ICD-10-CM

## 2022-12-25 DIAGNOSIS — M48061 Spinal stenosis, lumbar region without neurogenic claudication: Secondary | ICD-10-CM

## 2022-12-25 DIAGNOSIS — M5416 Radiculopathy, lumbar region: Secondary | ICD-10-CM

## 2022-12-25 NOTE — Patient Instructions (Signed)
It was so nice to see you today. Thank you so much for coming in.    I want to get an MRI of your lower back to look into things further. We will get this approved through your insurance and [facility] will call you to schedule the appointment.   After you have the MRI, it takes 5-7 days for me to get the results back. Once I have them, we will call you to schedule a follow up phone visit with me to review them.   I will review your MRI with Dr. Myer Haff as well.   Please do not hesitate to call if you have any questions or concerns. You can also message me in MyChart.   Drake Leach PA-C [phone]

## 2022-12-26 ENCOUNTER — Encounter: Payer: Self-pay | Admitting: Internal Medicine

## 2022-12-26 DIAGNOSIS — M545 Low back pain, unspecified: Secondary | ICD-10-CM | POA: Insufficient documentation

## 2022-12-31 ENCOUNTER — Ambulatory Visit
Admission: RE | Admit: 2022-12-31 | Discharge: 2022-12-31 | Disposition: A | Discharge status: Home / Self Care | Payer: Medicare Other | Source: Ambulatory Visit | Attending: Orthopedic Surgery | Admitting: Orthopedic Surgery

## 2022-12-31 DIAGNOSIS — M47817 Spondylosis without myelopathy or radiculopathy, lumbosacral region: Secondary | ICD-10-CM | POA: Diagnosis not present

## 2022-12-31 DIAGNOSIS — M4316 Spondylolisthesis, lumbar region: Secondary | ICD-10-CM | POA: Diagnosis not present

## 2022-12-31 DIAGNOSIS — M47816 Spondylosis without myelopathy or radiculopathy, lumbar region: Secondary | ICD-10-CM | POA: Diagnosis not present

## 2022-12-31 DIAGNOSIS — M48061 Spinal stenosis, lumbar region without neurogenic claudication: Secondary | ICD-10-CM | POA: Diagnosis not present

## 2022-12-31 DIAGNOSIS — M5136 Other intervertebral disc degeneration, lumbar region: Secondary | ICD-10-CM | POA: Diagnosis not present

## 2022-12-31 DIAGNOSIS — M5416 Radiculopathy, lumbar region: Secondary | ICD-10-CM | POA: Diagnosis not present

## 2023-01-08 ENCOUNTER — Telehealth: Payer: Self-pay | Admitting: Orthopedic Surgery

## 2023-01-08 DIAGNOSIS — M47816 Spondylosis without myelopathy or radiculopathy, lumbar region: Secondary | ICD-10-CM

## 2023-01-08 DIAGNOSIS — M48061 Spinal stenosis, lumbar region without neurogenic claudication: Secondary | ICD-10-CM

## 2023-01-08 DIAGNOSIS — M5416 Radiculopathy, lumbar region: Secondary | ICD-10-CM

## 2023-01-08 NOTE — Telephone Encounter (Signed)
Received message from [facility]- pt called and stating she is in terrible pain and wanted to speak with you or your nurse.   Spoke with patient. She continues with constant LBP and bilateral leg pain. She has trouble walking due to pain. No bowel or bladder issues.   MRI has not been formally read yet, but Dr. Myer Haff reviewed it and thinks her pain is from central stenosis at L2-L3.   He recommends fast track injection with Dr. Yves Dill (bilateral L2-L3 TF ESI). He also recommends PT and follow up with him in 4-6 weeks.   She agrees with injection. She would like to be premedicated. Will pass this along. She declines PT right now. Can consider HHPT if she is feeling better after injection.   Follow up made with Dr. Myer Haff in 4 weeks (okay per him). She is still adamant that she wants surgery.

## 2023-01-12 ENCOUNTER — Telehealth: Payer: Self-pay | Admitting: Internal Medicine

## 2023-01-12 NOTE — Telephone Encounter (Signed)
Called and spoke with patient. She woke up at 6AM and took her lovastatin, folic acid, valsartan 160 mg, diltiazem 240 mg and prilosec 20 mg. She says she has a lot on her mind about upcoming surgery, having to euthanize her dog, etc. She took her pills again at 8:30. As of right now, no acute issues. She has ate breakfast and drank coffee. Last BP was 133/73. Patient is wondering if she needs to do anything. She is going to recheck her BP in 1 hour.

## 2023-01-12 NOTE — Telephone Encounter (Signed)
Noted.  Have her continue to monitor her heart rate and blood pressure.  Hold on any further medication today.  Check blood pressure again today and let us know.  Confirm doing ok.

## 2023-01-12 NOTE — Telephone Encounter (Signed)
Patient says BP is 158/75 P- 51. She just got news that a family friend passed away. She is going to check her BP again this PM prior to going to bed. Advised patient that I will f/u with her in the morning prior to her taking any meds. She will check her BP when she gets up about 8:00

## 2023-01-12 NOTE — Telephone Encounter (Signed)
Patient just called and said she took her blood pressure bills at 6 am this morning. Than around 8 am and took them again and she doesn't know what to do. She would like for someone to call her. At [phone].

## 2023-01-12 NOTE — Telephone Encounter (Signed)
Updated BP readings

## 2023-01-12 NOTE — Telephone Encounter (Signed)
Patient states she has been taking her blood pressure and has the following readings:  9:20am - 153/71, pulse 66 10:30am - 136/82, pulse 60 10:40am - 135/60, pulse 57 11:20am - 135/56, pulse 57 12:30pm - 122/52, pulse 54  Patient states she is fine.  Patient states she will continue to check her blood pressure for the rest of the day.

## 2023-01-12 NOTE — Telephone Encounter (Signed)
LMTCB

## 2023-01-12 NOTE — Telephone Encounter (Signed)
Please f/u with her regarding her blood pressure and pulse.

## 2023-01-12 NOTE — Telephone Encounter (Signed)
With last pressure 120's/50s and pulse in the 50s, please call her back this pm to confirm not continuing to decrease.  If any problems, needs to be evaluated.

## 2023-01-13 DIAGNOSIS — M5416 Radiculopathy, lumbar region: Secondary | ICD-10-CM | POA: Diagnosis not present

## 2023-01-13 DIAGNOSIS — M48062 Spinal stenosis, lumbar region with neurogenic claudication: Secondary | ICD-10-CM | POA: Diagnosis not present

## 2023-01-13 NOTE — Telephone Encounter (Signed)
Yes. Ok to resume medication as prescribed.

## 2023-01-13 NOTE — Telephone Encounter (Signed)
Patient is aware 

## 2023-01-13 NOTE — Telephone Encounter (Signed)
169/67 59  154/66 54   BP this morning listed above. Patient wanted to check with you prior to taking her meds. Ok to resume regular medication regimen? She has her injection with Dr Yves Dill today

## 2023-01-16 NOTE — H&P (View-Only) (Signed)
Referring Physician:  Drake Leach, PA-C [address] [place],  [place] [address]  Primary Physician:  Dale [place], MD  History of Present Illness: 01/20/2023 [name] is here today with a chief complaint of severe back and leg pain that has significantly limited her mobility and quality of life. The pain is described as being located across the hip and radiating down to the legs, causing difficulty in turning and walking. The patient reports that the pain has been progressively worsening.  Approximately three months ago, the patient experienced a fall and injured her knee. Six days after the fall, the patient began experiencing severe back pain, which subsequently extended to the legs. The patient reports that she is now largely immobile, only moving between the bedroom, kitchen, and bathroom. Prior to the onset of this severe pain, the patient was active and able to do what she wanted.  The patient also reports a change in her posture, with a forward bend when walking. She also mentions a sensation of having "cardboard" on her feet. The patient has been managing the pain with Tylenol, taken every five to six hours.  Walking and bending make her pain worse.  Sitting down makes her pain better.   Bowel/Bladder Dysfunction: none    History of Present Illness: 12/25/2022 [name] has a history of GERD, glaucoma, hypercholesterolemia, hyperlipidemia, HTN, and RA.    History of lumbar surgery years ago with Dr. Teola Bradley. Last seen by Dr. Myer Haff in 2022 and she did well with injections.    She had a fall 2 months ago and she now has constant LBP with bilateral lateral leg pain to her feet. She has shooting pain and spasms. Pain is worse with walking, bending, and going from sitting to standing.    She had left GTB injection in July with some relief.    Bowel/Bladder Dysfunction: none   Conservative measures:  Physical therapy:  none Multimodal medical therapy including regular antiinflammatories: neurontin, remicade infusions Injections:   01/13/2023: Right L2-3 transforaminal ESI (  no relief )  10/09/2020: Right L4-5 and right L2-3 transforaminal ESI 2018: L4-5 laminectomy (Dr. Teola Bradley) 05/15/2016: Right sacroiliac joint injection 10/25/2015: Right S1 transforaminal ESI 09/28/2015: Right L5-S1 transforaminal ESI     Past Surgery:  L4-5 Laminectomy in 2018 by Dr. Larose Hires has no symptoms of cervical myelopathy.  The symptoms are causing a significant impact on the patient's life.   I have utilized the care everywhere function in epic to review the outside records available from external health systems.  Review of Systems:  A 10 point review of systems is negative, except for the pertinent positives and negatives detailed in the HPI.  Past Medical History: Past Medical History:  Diagnosis Date   Diverticulosis    Fibrocystic breast disease    GERD (gastroesophageal reflux disease)    Glaucoma 12/04/2020   History of kidney stones    Hypercholesterolemia    Hypertension    Hypertension    IBS (irritable bowel syndrome)    Inflammatory arthritis    positive anti-CCP abs, s/p prednisone, MTX, Remicade   Nephrolithiasis    Osteopenia    GI upset with Fosamax   Pancreatitis 2007   s/p ERCP   Pancreatitis    PONV (postoperative nausea and vomiting)    Renal insufficiency    Spinal stenosis of lumbosacral region     Past Surgical History: Past Surgical History:  Procedure  Laterality Date   ABDOMINAL HYSTERECTOMY  1993   ovaries not removed   BREAST BIOPSY Left 1984   negative   BREAST CYST EXCISION Left 2010   negative   COLONOSCOPY N/A 12/05/2020   Procedure: COLONOSCOPY;  Surgeon: Regis Bill, MD;  Location: [facility] ENDOSCOPY;  Service: Endoscopy;  Laterality: N/A;   LITHOTRIPSY     LUMBAR LAMINECTOMY  1983   LUMBAR LAMINECTOMY/DECOMPRESSION MICRODISCECTOMY  N/A 08/18/2016   Procedure: LUMBAR LAMINECTOMY/DECOMPRESSION MICRODISCECTOMY 1 LEVEL;  Surgeon: Keith Rake, MD;  Location: [facility] ORS;  Service: Neurosurgery;  Laterality: N/A;  L4-5 Laminectomy, MIS, L5 foraminotomy   TRIGGER FINGER RELEASE     right ring finger    Allergies: Allergies as of 01/20/2023 - Review Complete 01/20/2023  Allergen Reaction Noted   Carisoprodol Hives 08/26/2016   Metronidazole Swelling, Other (See Comments), and Rash 03/30/2012   Codeine Nausea Only 03/30/2012   Nsaids  08/18/2016   Skelaxin [metaxalone] Swelling 03/30/2012   Tenormin [atenolol] Other (See Comments) 03/30/2012   Tramadol Nausea And Vomiting 11/19/2015    Medications:  Current Outpatient Medications:    Cholecalciferol (VITAMIN D) 2000 UNITS tablet, Take 2,000 Units by mouth daily., Disp: , Rfl:    diltiazem (DILT-XR) 240 MG 24 hr capsule, Take 1 capsule (240 mg total) by mouth daily., Disp: 90 capsule, Rfl: 3   dorzolamide-timolol (COSOPT) 22.3-6.8 MG/ML ophthalmic solution, INSTILL 1 DROP INTO EACH EYE TWICE DAILY, Disp: , Rfl: 5   fluticasone (FLONASE) 50 MCG/ACT nasal spray, USE TWO SPRAY(S) IN EACH NOSTRIL ONCE DAILY, Disp: 16 g, Rfl: 11   folic acid (FOLVITE) 1 MG tablet, Take 1 mg by mouth daily., Disp: , Rfl:    gabapentin (NEURONTIN) 300 MG capsule, Take 1 capsule (300 mg total) by mouth at bedtime., Disp: 90 capsule, Rfl: 3   InFLIXimab (REMICADE IV), Inject into the vein every 6 (six) weeks. Per Dr Gavin Potters, Disp: , Rfl:    latanoprost (XALATAN) 0.005 % ophthalmic solution, Place 1 drop into both eyes at bedtime., Disp: , Rfl:    lovastatin (MEVACOR) 40 MG tablet, Take 1 tablet (40 mg total) by mouth daily., Disp: 90 tablet, Rfl: 3   methotrexate 25 MG/ML SOLN, Inject 17.5 mg into the skin once a week. Patient takes 17.5 mg (0.7 ml) on Monday or Tuesday of each week., Disp: , Rfl:    mupirocin ointment (BACTROBAN) 2 %, Apply 1 application topically 2 (two) times daily., Disp: 22 g,  Rfl: 0   omeprazole (PRILOSEC) 20 MG capsule, Take 1 capsule (20 mg total) by mouth daily., Disp: 90 capsule, Rfl: 3   Probiotic Product (ALIGN) 4 MG CAPS, Take one capsule daily, Disp: 30 capsule, Rfl: 0   psyllium (METAMUCIL) 58.6 % packet, Take 1 packet by mouth daily., Disp: , Rfl:    valsartan (DIOVAN) 160 MG tablet, TAKE 1 TABLET(160 MG) BY MOUTH DAILY, Disp: 90 tablet, Rfl: 3  Social History: Social History   Tobacco Use   Smoking status: Never   Smokeless tobacco: Never  Vaping Use   Vaping status: Never Used  Substance Use Topics   Alcohol use: No    Alcohol/week: 0.0 standard drinks of alcohol   Drug use: No    Family Medical History: Family History  Problem Relation Age of Onset   Ovarian cancer Mother    Renal Disease Mother    Heart disease Mother    Diabetes Mother    Hypertension Mother    Heart disease Father  Breast cancer Neg Hx     Physical Examination: Vitals:   01/20/23 1347  BP: (!) 140/72    General: Patient is in no apparent distress. Attention to examination is appropriate.  Neck:   Supple.  Full range of motion.  Respiratory: Patient is breathing without any difficulty.   NEUROLOGICAL:     Awake, alert, oriented to person, place, and time.  Speech is clear and fluent.   Cranial Nerves: Pupils equal round and reactive to light.  Facial tone is symmetric.  Facial sensation is symmetric. Shoulder shrug is symmetric. Tongue protrusion is midline.  There is no pronator drift.  Strength: Side Biceps Triceps Deltoid Interossei Grip Wrist Ext. Wrist Flex.  R 5 5 5 5 5 5 5   L 5 5 5 5 5 5 5    Side Iliopsoas Quads Hamstring PF DF EHL  R 5 5 5 5 5 5   L 5 5 5 5 5 5    Reflexes are 1+ and symmetric at the biceps, triceps, brachioradialis, patella and achilles.   Hoffman's is absent.   Bilateral upper and lower extremity sensation is intact to light touch.    No evidence of dysmetria noted.  Gait is stooped.     Medical Decision  Making  Imaging: MRI L spine 12/31/2022 IMPRESSION: 1. Overall, no significant interval change since the study from 09/01/2020. 2. Multifactorial severe spinal canal stenosis with cauda equina nerve root compression at L2-L3, unchanged. 3. Status post right laminectomy at L3-L4 with unchanged grade 1 anterolisthesis, disc bulge, and advanced left facet arthropathy resulting in persistent left subarticular zone effacement with possible impingement of the traversing L4 nerve root, and severe right neural foraminal stenosis, unchanged. 4. L4-L5: No other high-grade spinal canal or neural foraminal stenosis. No acute osseous finding.     Electronically Signed   By: Lesia Hausen M.D.   On: 01/12/2023 16:43  I have personally reviewed the images and agree with the above interpretation.  Assessment and Plan: [name] is a pleasant 89 y.o. female with neurogenic claudication due to lumbar spinal stenosis.  She has recurrent stenosis at L3-4 as well as severe stenosis at L2-3.  She has not tried and failed conservative management including physical therapy and injections.  She has also tried medications.  At this point no further conservative management is indicated.  I have recommended L2-4 minimally invasive decompression.  I discussed the planned procedure at length with the patient, including the risks, benefits, alternatives, and indications. The risks discussed include but are not limited to bleeding, infection, need for reoperation, spinal fluid leak, stroke, vision loss, anesthetic complication, coma, paralysis, and even death. I also described in detail that improvement was not guaranteed.  The patient expressed understanding of these risks, and asked that we proceed with surgery. I described the surgery in layman's terms, and gave ample opportunity for questions, which were answered to the best of my ability.  She is seeing her primary care provider tomorrow.  She will discuss  preoperative evaluation.  I think she is an excellent candidate for this intervention.  I spent a total of 30 minutes in this patient's care today. This time was spent reviewing pertinent records including imaging studies, obtaining and confirming history, performing a directed evaluation, formulating and discussing my recommendations, and documenting the visit within the medical record.    Thank you for involving me in the care of this patient.      Stacey Snyder K. Myer Haff MD, [facility] Neurosurgery

## 2023-01-16 NOTE — Progress Notes (Unsigned)
Referring Physician:  Drake Leach, PA-C [address] [place],  [place] [address]  Primary Physician:  Dale [place], MD  History of Present Illness: 01/20/2023 Stacey Snyder is here today with a chief complaint of severe back and leg pain that has significantly limited her mobility and quality of life. The pain is described as being located across the hip and radiating down to the legs, causing difficulty in turning and walking. The patient reports that the pain has been progressively worsening.  Approximately three months ago, the patient experienced a fall and injured her knee. Six days after the fall, the patient began experiencing severe back pain, which subsequently extended to the legs. The patient reports that she is now largely immobile, only moving between the bedroom, kitchen, and bathroom. Prior to the onset of this severe pain, the patient was active and able to do what she wanted.  The patient also reports a change in her posture, with a forward bend when walking. She also mentions a sensation of having "cardboard" on her feet. The patient has been managing the pain with Tylenol, taken every five to six hours.  Walking and bending make her pain worse.  Sitting down makes her pain better.   Bowel/Bladder Dysfunction: none    History of Present Illness: 12/25/2022 [name] has a history of GERD, glaucoma, hypercholesterolemia, hyperlipidemia, HTN, and RA.    History of lumbar surgery years ago with Dr. Teola Bradley. Last seen by Dr. Myer Haff in 2022 and she did well with injections.    She had a fall 2 months ago and she now has constant LBP with bilateral lateral leg pain to her feet. She has shooting pain and spasms. Pain is worse with walking, bending, and going from sitting to standing.    She had left GTB injection in July with some relief.    Bowel/Bladder Dysfunction: none   Conservative measures:  Physical therapy:  none Multimodal medical therapy including regular antiinflammatories: neurontin, remicade infusions Injections:   01/13/2023: Right L2-3 transforaminal ESI (  no relief )  10/09/2020: Right L4-5 and right L2-3 transforaminal ESI 2018: L4-5 laminectomy (Dr. Teola Bradley) 05/15/2016: Right sacroiliac joint injection 10/25/2015: Right S1 transforaminal ESI 09/28/2015: Right L5-S1 transforaminal ESI     Past Surgery:  L4-5 Laminectomy in 2018 by Dr. Larose Hires has no symptoms of cervical myelopathy.  The symptoms are causing a significant impact on the patient's life.   I have utilized the care everywhere function in epic to review the outside records available from external health systems.  Review of Systems:  A 10 point review of systems is negative, except for the pertinent positives and negatives detailed in the HPI.  Past Medical History: Past Medical History:  Diagnosis Date   Diverticulosis    Fibrocystic breast disease    GERD (gastroesophageal reflux disease)    Glaucoma 12/04/2020   History of kidney stones    Hypercholesterolemia    Hypertension    Hypertension    IBS (irritable bowel syndrome)    Inflammatory arthritis    positive anti-CCP abs, s/p prednisone, MTX, Remicade   Nephrolithiasis    Osteopenia    GI upset with Fosamax   Pancreatitis 2007   s/p ERCP   Pancreatitis    PONV (postoperative nausea and vomiting)    Renal insufficiency    Spinal stenosis of lumbosacral region     Past Surgical History: Past Surgical History:  Procedure  Laterality Date   ABDOMINAL HYSTERECTOMY  1993   ovaries not removed   BREAST BIOPSY Left 1984   negative   BREAST CYST EXCISION Left 2010   negative   COLONOSCOPY N/A 12/05/2020   Procedure: COLONOSCOPY;  Surgeon: Regis Bill, MD;  Location: [facility] ENDOSCOPY;  Service: Endoscopy;  Laterality: N/A;   LITHOTRIPSY     LUMBAR LAMINECTOMY  1983   LUMBAR LAMINECTOMY/DECOMPRESSION MICRODISCECTOMY  N/A 08/18/2016   Procedure: LUMBAR LAMINECTOMY/DECOMPRESSION MICRODISCECTOMY 1 LEVEL;  Surgeon: Keith Rake, MD;  Location: [facility] ORS;  Service: Neurosurgery;  Laterality: N/A;  L4-5 Laminectomy, MIS, L5 foraminotomy   TRIGGER FINGER RELEASE     right ring finger    Allergies: Allergies as of 01/20/2023 - Review Complete 01/20/2023  Allergen Reaction Noted   Carisoprodol Hives 08/26/2016   Metronidazole Swelling, Other (See Comments), and Rash 03/30/2012   Codeine Nausea Only 03/30/2012   Nsaids  08/18/2016   Skelaxin [metaxalone] Swelling 03/30/2012   Tenormin [atenolol] Other (See Comments) 03/30/2012   Tramadol Nausea And Vomiting 11/19/2015    Medications:  Current Outpatient Medications:    Cholecalciferol (VITAMIN D) 2000 UNITS tablet, Take 2,000 Units by mouth daily., Disp: , Rfl:    diltiazem (DILT-XR) 240 MG 24 hr capsule, Take 1 capsule (240 mg total) by mouth daily., Disp: 90 capsule, Rfl: 3   dorzolamide-timolol (COSOPT) 22.3-6.8 MG/ML ophthalmic solution, INSTILL 1 DROP INTO EACH EYE TWICE DAILY, Disp: , Rfl: 5   fluticasone (FLONASE) 50 MCG/ACT nasal spray, USE TWO SPRAY(S) IN EACH NOSTRIL ONCE DAILY, Disp: 16 g, Rfl: 11   folic acid (FOLVITE) 1 MG tablet, Take 1 mg by mouth daily., Disp: , Rfl:    gabapentin (NEURONTIN) 300 MG capsule, Take 1 capsule (300 mg total) by mouth at bedtime., Disp: 90 capsule, Rfl: 3   InFLIXimab (REMICADE IV), Inject into the vein every 6 (six) weeks. Per Dr Gavin Potters, Disp: , Rfl:    latanoprost (XALATAN) 0.005 % ophthalmic solution, Place 1 drop into both eyes at bedtime., Disp: , Rfl:    lovastatin (MEVACOR) 40 MG tablet, Take 1 tablet (40 mg total) by mouth daily., Disp: 90 tablet, Rfl: 3   methotrexate 25 MG/ML SOLN, Inject 17.5 mg into the skin once a week. Patient takes 17.5 mg (0.7 ml) on Monday or Tuesday of each week., Disp: , Rfl:    mupirocin ointment (BACTROBAN) 2 %, Apply 1 application topically 2 (two) times daily., Disp: 22 g,  Rfl: 0   omeprazole (PRILOSEC) 20 MG capsule, Take 1 capsule (20 mg total) by mouth daily., Disp: 90 capsule, Rfl: 3   Probiotic Product (ALIGN) 4 MG CAPS, Take one capsule daily, Disp: 30 capsule, Rfl: 0   psyllium (METAMUCIL) 58.6 % packet, Take 1 packet by mouth daily., Disp: , Rfl:    valsartan (DIOVAN) 160 MG tablet, TAKE 1 TABLET(160 MG) BY MOUTH DAILY, Disp: 90 tablet, Rfl: 3  Social History: Social History   Tobacco Use   Smoking status: Never   Smokeless tobacco: Never  Vaping Use   Vaping status: Never Used  Substance Use Topics   Alcohol use: No    Alcohol/week: 0.0 standard drinks of alcohol   Drug use: No    Family Medical History: Family History  Problem Relation Age of Onset   Ovarian cancer Mother    Renal Disease Mother    Heart disease Mother    Diabetes Mother    Hypertension Mother    Heart disease Father  Breast cancer Neg Hx     Physical Examination: Vitals:   01/20/23 1347  BP: (!) 140/72    General: Patient is in no apparent distress. Attention to examination is appropriate.  Neck:   Supple.  Full range of motion.  Respiratory: Patient is breathing without any difficulty.   NEUROLOGICAL:     Awake, alert, oriented to person, place, and time.  Speech is clear and fluent.   Cranial Nerves: Pupils equal round and reactive to light.  Facial tone is symmetric.  Facial sensation is symmetric. Shoulder shrug is symmetric. Tongue protrusion is midline.  There is no pronator drift.  Strength: Side Biceps Triceps Deltoid Interossei Grip Wrist Ext. Wrist Flex.  R 5 5 5 5 5 5 5   L 5 5 5 5 5 5 5    Side Iliopsoas Quads Hamstring PF DF EHL  R 5 5 5 5 5 5   L 5 5 5 5 5 5    Reflexes are 1+ and symmetric at the biceps, triceps, brachioradialis, patella and achilles.   Hoffman's is absent.   Bilateral upper and lower extremity sensation is intact to light touch.    No evidence of dysmetria noted.  Gait is stooped.     Medical Decision  Making  Imaging: MRI L spine 12/31/2022 IMPRESSION: 1. Overall, no significant interval change since the study from 09/01/2020. 2. Multifactorial severe spinal canal stenosis with cauda equina nerve root compression at L2-L3, unchanged. 3. Status post right laminectomy at L3-L4 with unchanged grade 1 anterolisthesis, disc bulge, and advanced left facet arthropathy resulting in persistent left subarticular zone effacement with possible impingement of the traversing L4 nerve root, and severe right neural foraminal stenosis, unchanged. 4. L4-L5: No other high-grade spinal canal or neural foraminal stenosis. No acute osseous finding.     Electronically Signed   By: Lesia Hausen M.D.   On: 01/12/2023 16:43  I have personally reviewed the images and agree with the above interpretation.  Assessment and Plan: Ms. Anhorn is a pleasant 89 y.o. female with neurogenic claudication due to lumbar spinal stenosis.  She has recurrent stenosis at L3-4 as well as severe stenosis at L2-3.  She has not tried and failed conservative management including physical therapy and injections.  She has also tried medications.  At this point no further conservative management is indicated.  I have recommended L2-4 minimally invasive decompression.  I discussed the planned procedure at length with the patient, including the risks, benefits, alternatives, and indications. The risks discussed include but are not limited to bleeding, infection, need for reoperation, spinal fluid leak, stroke, vision loss, anesthetic complication, coma, paralysis, and even death. I also described in detail that improvement was not guaranteed.  The patient expressed understanding of these risks, and asked that we proceed with surgery. I described the surgery in layman's terms, and gave ample opportunity for questions, which were answered to the best of my ability.  She is seeing her primary care provider tomorrow.  She will discuss  preoperative evaluation.  I think she is an excellent candidate for this intervention.  I spent a total of 30 minutes in this patient's care today. This time was spent reviewing pertinent records including imaging studies, obtaining and confirming history, performing a directed evaluation, formulating and discussing my recommendations, and documenting the visit within the medical record.    Thank you for involving me in the care of this patient.      Evynn Boutelle K. Myer Haff MD, [facility] Neurosurgery

## 2023-01-19 ENCOUNTER — Other Ambulatory Visit: Payer: Medicare Other

## 2023-01-20 ENCOUNTER — Other Ambulatory Visit (INDEPENDENT_AMBULATORY_CARE_PROVIDER_SITE_OTHER): Payer: Medicare Other

## 2023-01-20 ENCOUNTER — Other Ambulatory Visit: Payer: Self-pay

## 2023-01-20 ENCOUNTER — Ambulatory Visit (INDEPENDENT_AMBULATORY_CARE_PROVIDER_SITE_OTHER): Payer: Medicare Other | Admitting: Neurosurgery

## 2023-01-20 ENCOUNTER — Encounter: Payer: Self-pay | Admitting: Neurosurgery

## 2023-01-20 VITALS — BP 140/72 | Ht 61.0 in | Wt 96.3 lb

## 2023-01-20 DIAGNOSIS — M069 Rheumatoid arthritis, unspecified: Secondary | ICD-10-CM | POA: Diagnosis not present

## 2023-01-20 DIAGNOSIS — M48062 Spinal stenosis, lumbar region with neurogenic claudication: Secondary | ICD-10-CM | POA: Diagnosis not present

## 2023-01-20 DIAGNOSIS — Z01818 Encounter for other preprocedural examination: Secondary | ICD-10-CM

## 2023-01-20 DIAGNOSIS — E7800 Pure hypercholesterolemia, unspecified: Secondary | ICD-10-CM | POA: Diagnosis not present

## 2023-01-20 LAB — BASIC METABOLIC PANEL
BUN: 20 mg/dL (ref 6–23)
CO2: 27 meq/L (ref 19–32)
Calcium: 9.3 mg/dL (ref 8.4–10.5)
Chloride: 98 meq/L (ref 96–112)
Creatinine, Ser: 0.84 mg/dL (ref 0.40–1.20)
GFR: 62.32 mL/min (ref >60.00)
Glucose, Bld: 98 mg/dL (ref 70–99)
Potassium: 3.7 meq/L (ref 3.5–5.1)
Sodium: 133 meq/L — ABNORMAL LOW (ref 135–145)

## 2023-01-20 LAB — CBC WITH DIFFERENTIAL/PLATELET
Basophils Absolute: 0 10*3/uL (ref 0.0–0.1)
Basophils Relative: 0.6 % (ref 0.0–3.0)
Eosinophils Absolute: 0.3 10*3/uL (ref 0.0–0.7)
Eosinophils Relative: 3.5 % (ref 0.0–5.0)
HCT: 35.6 % — ABNORMAL LOW (ref 36.0–46.0)
Hemoglobin: 11.8 g/dL — ABNORMAL LOW (ref 12.0–15.0)
Lymphocytes Relative: 30.1 % (ref 12.0–46.0)
Lymphs Abs: 2.3 10*3/uL (ref 0.7–4.0)
MCHC: 33 g/dL (ref 30.0–36.0)
MCV: 95.9 fl (ref 78.0–100.0)
Monocytes Absolute: 1.2 10*3/uL — ABNORMAL HIGH (ref 0.1–1.0)
Monocytes Relative: 16 % — ABNORMAL HIGH (ref 3.0–12.0)
Neutro Abs: 3.8 10*3/uL (ref 1.4–7.7)
Neutrophils Relative %: 49.8 % (ref 43.0–77.0)
Platelets: 337 10*3/uL (ref 150.0–400.0)
RBC: 3.72 Mil/uL — ABNORMAL LOW (ref 3.87–5.11)
RDW: 18.1 % — ABNORMAL HIGH (ref 11.5–15.5)
WBC: 7.6 10*3/uL (ref 4.0–10.5)

## 2023-01-20 LAB — HEPATIC FUNCTION PANEL
ALT: 20 U/L (ref 0–35)
AST: 21 U/L (ref 0–37)
Albumin: 4 g/dL (ref 3.5–5.2)
Alkaline Phosphatase: 74 U/L (ref 39–117)
Bilirubin, Direct: 0.1 mg/dL (ref 0.0–0.3)
Total Bilirubin: 0.6 mg/dL (ref 0.2–1.2)
Total Protein: 6.6 g/dL (ref 6.0–8.3)

## 2023-01-20 LAB — LIPID PANEL
Cholesterol: 151 mg/dL (ref 0–200)
HDL: 74.9 mg/dL (ref >39.00)
LDL Cholesterol: 51 mg/dL (ref 0–99)
NonHDL: 76.2
Total CHOL/HDL Ratio: 2
Triglycerides: 126 mg/dL (ref 0.0–149.0)
VLDL: 25.2 mg/dL (ref 0.0–40.0)

## 2023-01-20 NOTE — Patient Instructions (Signed)
Please see below for information in regards to your upcoming surgery:   Planned surgery: L3-5 posterior spinal decompression   Surgery date: 02/09/23 at [facility]: [address], [place], [place] [address]) - you will find out your arrival time the business day before your surgery.   Pre-op appointment at [facility] Pre-admit Testing: we will call you with a date/time for this. If you are scheduled for an in person appointment, Pre-admit Testing is located on the first floor of the [facility], [address], [address]. Please bring all prescriptions in the original prescription bottles to your appointment. During this appointment, they will advise you which medications you can take the morning of surgery, and which medications you will need to hold for surgery. Labs (such as blood work, EKG) may be done at your pre-op appointment. You are not required to fast for these labs. Should you need to change your pre-op appointment, please call Pre-admit testing at [phone].    Remicade: last dose was 12/22/22. Skip next dose - can resume 5 weeks after surgery   Surgical clearance: we will send a clearance form to Dr Lorin Picket    Common restrictions after surgery: No bending, lifting, or twisting ("BLT"). Avoid lifting objects heavier than 10 pounds for the first 6 weeks after surgery. Where possible, avoid household activities that involve lifting, bending, reaching, pushing, or pulling such as laundry, vacuuming, grocery shopping, and childcare. Try to arrange for help from friends and family for these activities while you heal. Do not drive while taking prescription pain medication. Weeks 6 through 12 after surgery: avoid lifting more than 25 pounds.     How to contact us:  If you have any questions/concerns before or after surgery, you can reach [place] at [phone], or you can send a mychart message. We can be reached by phone or  mychart 8am-4pm, Monday-Friday.  *Please note: Calls after 4pm are forwarded to a third party answering service. Mychart messages are not routinely monitored during evenings, weekends, and holidays. Please call our office to contact the answering service for urgent concerns during non-business hours.    Appointments/FMLA & disability paperwork: Joycelyn Rua, & Flonnie Hailstone Nurse: Royston Cowper  Medical assistants: Laurann Montana, & Lyla Son Physician Assistants: Manning Charity & Drake Leach Surgeons: Venetia Night, MD & Ernestine Mcmurray, MD

## 2023-01-21 ENCOUNTER — Other Ambulatory Visit: Payer: Self-pay | Admitting: Internal Medicine

## 2023-01-21 ENCOUNTER — Ambulatory Visit (INDEPENDENT_AMBULATORY_CARE_PROVIDER_SITE_OTHER): Payer: Medicare Other | Admitting: Internal Medicine

## 2023-01-21 ENCOUNTER — Ambulatory Visit (INDEPENDENT_AMBULATORY_CARE_PROVIDER_SITE_OTHER): Payer: Medicare Other

## 2023-01-21 ENCOUNTER — Encounter: Payer: Self-pay | Admitting: Internal Medicine

## 2023-01-21 VITALS — BP 136/74 | HR 71 | Temp 98.2°F | Resp 16 | Ht 61.0 in | Wt 92.8 lb

## 2023-01-21 DIAGNOSIS — E7800 Pure hypercholesterolemia, unspecified: Secondary | ICD-10-CM | POA: Diagnosis not present

## 2023-01-21 DIAGNOSIS — I10 Essential (primary) hypertension: Secondary | ICD-10-CM

## 2023-01-21 DIAGNOSIS — D649 Anemia, unspecified: Secondary | ICD-10-CM

## 2023-01-21 DIAGNOSIS — Z23 Encounter for immunization: Secondary | ICD-10-CM | POA: Diagnosis not present

## 2023-01-21 DIAGNOSIS — I700 Atherosclerosis of aorta: Secondary | ICD-10-CM | POA: Diagnosis not present

## 2023-01-21 DIAGNOSIS — D329 Benign neoplasm of meninges, unspecified: Secondary | ICD-10-CM

## 2023-01-21 DIAGNOSIS — Z01818 Encounter for other preprocedural examination: Secondary | ICD-10-CM

## 2023-01-21 DIAGNOSIS — M069 Rheumatoid arthritis, unspecified: Secondary | ICD-10-CM | POA: Diagnosis not present

## 2023-01-21 LAB — IBC + FERRITIN
Ferritin: 303.9 ng/mL — ABNORMAL HIGH (ref 10.0–291.0)
Iron: 149 ug/dL — ABNORMAL HIGH (ref 42–145)
Saturation Ratios: 49.5 % (ref 20.0–50.0)
TIBC: 301 ug/dL (ref 250.0–450.0)
Transferrin: 215 mg/dL (ref 212.0–360.0)

## 2023-01-21 NOTE — Progress Notes (Signed)
Subjective:    Patient ID: Stacey Snyder, female    DOB: 10/31/1932, 89 y.o.   MRN: [id number]  Patient here for  Chief Complaint  Patient presents with   Pre-op Exam    HPI Here to follow up regarding hypercholesterolemia and hypertension, also pre op. On diovan. Off triam/hctz. Continues diltiazem. Has RA - receiving remicade and MTX. Feels joints stable. Saw ortho 11/19/22 - s/p fall - left knee pain and leg discomfort. S/p left trochanteric bursa steroid injection.  Having persistent low back pain.  Saw [facility] 12/25/22 - MrI - central stenosis L2-3.  S/p ESI 01/13/23 - Dr Yves Dill. Persistent pain - recommended L2-4 decompression. She reports persistent pain as outlined.  Limiting activity.  Using walker.  No chest pain or sob reported.  Breathing stable.  No increased cough or congestion.  No abdominal pain reported.     Past Medical History:  Diagnosis Date   Diverticulosis    Fibrocystic breast disease    GERD (gastroesophageal reflux disease)    Glaucoma 12/04/2020   History of kidney stones    Hypercholesterolemia    Hypertension    Hypertension    IBS (irritable bowel syndrome)    Inflammatory arthritis    positive anti-CCP abs, s/p prednisone, MTX, Remicade   Nephrolithiasis    Osteopenia    GI upset with Fosamax   Pancreatitis 2007   s/p ERCP   Pancreatitis    PONV (postoperative nausea and vomiting)    Renal insufficiency    Spinal stenosis of lumbosacral region    Past Surgical History:  Procedure Laterality Date   ABDOMINAL HYSTERECTOMY  1993   ovaries not removed   BREAST BIOPSY Left 1984   negative   BREAST CYST EXCISION Left 2010   negative   COLONOSCOPY N/A 12/05/2020   Procedure: COLONOSCOPY;  Surgeon: Regis Bill, MD;  Location: [facility];  Service: Endoscopy;  Laterality: N/A;   LITHOTRIPSY     LUMBAR LAMINECTOMY  1983   LUMBAR LAMINECTOMY/DECOMPRESSION MICRODISCECTOMY N/A 08/18/2016   Procedure: LUMBAR LAMINECTOMY/DECOMPRESSION  MICRODISCECTOMY 1 LEVEL;  Surgeon: Keith Rake, MD;  Location: [facility];  Service: Neurosurgery;  Laterality: N/A;  L4-5 Laminectomy, MIS, L5 foraminotomy   TRIGGER FINGER RELEASE     right ring finger   Family History  Problem Relation Age of Onset   Ovarian cancer Mother    Renal Disease Mother    Heart disease Mother    Diabetes Mother    Hypertension Mother    Heart disease Father    Breast cancer Neg Hx    Social History   Socioeconomic History   Marital status: Married    Spouse name: Not on file   Number of children: 4   Years of education: Not on file   Highest education level: Not on file  Occupational History   Not on file  Tobacco Use   Smoking status: Never   Smokeless tobacco: Never  Vaping Use   Vaping status: Never Used  Substance and Sexual Activity   Alcohol use: No    Alcohol/week: 0.0 standard drinks of alcohol   Drug use: No   Sexual activity: Not on file  Other Topics Concern   Not on file  Social History Narrative   Not on file   Social Determinants of Health   Financial Resource Strain: Not on file  Food Insecurity: Not on file  Transportation Needs: Not on file  Physical Activity: Not on file  Stress: Not  on file  Social Connections: Not on file     Review of Systems  Constitutional:  Negative for appetite change and unexpected weight change.  HENT:  Negative for congestion and sinus pressure.   Respiratory:  Negative for cough, chest tightness and shortness of breath.   Cardiovascular:  Negative for chest pain and palpitations.  Gastrointestinal:  Negative for abdominal pain, diarrhea, nausea and vomiting.  Genitourinary:  Negative for difficulty urinating and dysuria.  Musculoskeletal:  Negative for joint swelling and myalgias.  Skin:  Negative for color change and rash.  Neurological:  Negative for dizziness and headaches.  Psychiatric/Behavioral:  Negative for agitation and dysphoric mood.        Objective:     BP 136/74    Pulse 71   Temp 98.2 F (36.8 C)   Resp 16   Ht 5\' 1"  (1.549 m)   Wt 92 lb 12.8 oz (42.1 kg)   SpO2 99%   BMI 17.53 kg/m  Wt Readings from Last 3 Encounters:  01/21/23 92 lb 12.8 oz (42.1 kg)  01/20/23 96 lb 4.8 oz (43.7 kg)  12/25/22 96 lb 3.2 oz (43.6 kg)    Physical Exam Vitals reviewed.  Constitutional:      General: She is not in acute distress.    Appearance: Normal appearance.  HENT:     Head: Normocephalic and atraumatic.     Right Ear: External ear normal.     Left Ear: External ear normal.  Eyes:     General: No scleral icterus.       Right eye: No discharge.        Left eye: No discharge.     Conjunctiva/sclera: Conjunctivae normal.  Neck:     Thyroid: No thyromegaly.  Cardiovascular:     Rate and Rhythm: Normal rate and regular rhythm.  Pulmonary:     Effort: No respiratory distress.     Breath sounds: Normal breath sounds. No wheezing.  Abdominal:     General: Bowel sounds are normal.     Palpations: Abdomen is soft.     Tenderness: There is no abdominal tenderness.  Musculoskeletal:        General: No swelling or tenderness.     Cervical back: Neck supple. No tenderness.  Lymphadenopathy:     Cervical: No cervical adenopathy.  Skin:    Findings: No erythema or rash.  Neurological:     Mental Status: She is alert.  Psychiatric:        Mood and Affect: Mood normal.        Behavior: Behavior normal.      Outpatient Encounter Medications as of 01/21/2023  Medication Sig   Cholecalciferol (VITAMIN D) 2000 UNITS tablet Take 2,000 Units by mouth daily.   diltiazem (DILT-XR) 240 MG 24 hr capsule Take 1 capsule (240 mg total) by mouth daily.   dorzolamide-timolol (COSOPT) 22.3-6.8 MG/ML ophthalmic solution INSTILL 1 DROP INTO EACH EYE TWICE DAILY   fluticasone (FLONASE) 50 MCG/ACT nasal spray USE TWO SPRAY(S) IN EACH NOSTRIL ONCE DAILY   folic acid (FOLVITE) 1 MG tablet Take 1 mg by mouth daily.   gabapentin (NEURONTIN) 300 MG capsule Take 1  capsule (300 mg total) by mouth at bedtime.   InFLIXimab (REMICADE IV) Inject into the vein every 6 (six) weeks. Per Dr Gavin Potters   latanoprost (XALATAN) 0.005 % ophthalmic solution Place 1 drop into both eyes at bedtime.   lovastatin (MEVACOR) 40 MG tablet Take 1 tablet (40 mg total) by  mouth daily.   methotrexate 25 MG/ML SOLN Inject 17.5 mg into the skin once a week. Patient takes 17.5 mg (0.7 ml) on Monday or Tuesday of each week.   mupirocin ointment (BACTROBAN) 2 % Apply 1 application topically 2 (two) times daily.   omeprazole (PRILOSEC) 20 MG capsule Take 1 capsule (20 mg total) by mouth daily.   Probiotic Product (ALIGN) 4 MG CAPS Take one capsule daily   psyllium (METAMUCIL) 58.6 % packet Take 1 packet by mouth daily.   valsartan (DIOVAN) 160 MG tablet TAKE 1 TABLET(160 MG) BY MOUTH DAILY   No facility-administered encounter medications on file as of 01/21/2023.     Lab Results  Component Value Date   WBC 7.6 01/20/2023   HGB 11.8 (L) 01/20/2023   HCT 35.6 (L) 01/20/2023   PLT 337.0 01/20/2023   GLUCOSE 98 01/20/2023   CHOL 151 01/20/2023   TRIG 126.0 01/20/2023   HDL 74.90 01/20/2023   LDLCALC 51 01/20/2023   ALT 20 01/20/2023   AST 21 01/20/2023   NA 133 (L) 01/20/2023   K 3.7 01/20/2023   CL 98 01/20/2023   CREATININE 0.84 01/20/2023   BUN 20 01/20/2023   CO2 27 01/20/2023   TSH 1.55 05/26/2022   INR 1.0 12/03/2020    MR LUMBAR SPINE WO CONTRAST  Result Date: 01/12/2023 CLINICAL DATA:  Low back pain radiating down the right leg after a fall in June. EXAM: MRI LUMBAR SPINE WITHOUT CONTRAST TECHNIQUE: Multiplanar, multisequence MR imaging of the lumbar spine was performed. No intravenous contrast was administered. COMPARISON:  Lumbar spine MRI 09/01/2020 FINDINGS: Segmentation: Standard; the lowest formed disc space is designated L5-S1. Alignment: There is mild levocurvature. Grade 1 retrolisthesis of L1 on L2 and grade 1 anterolisthesis of L3 on L4 are unchanged.  Vertebrae: Vertebral body heights are preserved. Background marrow signal is normal. A subcentimeter focus of T1 hypointensity along the inferior L2 endplate is unchanged since 2022, likely benign given stability. There is no suspicious marrow signal abnormality. There is trace degenerative endplate edema posteriorly at L3-L4. There is no other marrow edema. Conus medullaris and cauda equina: Conus extends to the T12-L1 level. Conus and cauda equina appear normal. Paraspinal and other soft tissues: Pancreatic and renal cysts are noted requiring no specific imaging follow-up. There is STIR signal abnormality in the interspinous space at L3-L4, unchanged. The paraspinal soft tissues are otherwise unremarkable. Disc levels: There is moderate disc desiccation and narrowing at L3-L4 and overall mild disc degeneration at the other levels. T12-L1: No significant spinal canal or neural foraminal stenosis L1-L2: There is a disc bulge with a superimposed central protrusion/superiorly migrated extrusion and mild facet arthropathy with ligamentum flavum thickening resulting in mild spinal canal stenosis with bilateral subarticular zone narrowing but no evidence of frank nerve root impingement, and mild left worse than right neural foraminal stenosis, unchanged. L2-L3: There is a diffuse disc bulge and moderate to severe facet arthropathy with ligamentum flavum thickening resulting in severe spinal canal stenosis with cauda equina nerve root compression, and mild bilateral neural foraminal stenosis. Findings are unchanged. L3-L4: Status post right laminectomy. There is grade 1 anterolisthesis with a superimposed disc bulge and severe left facet arthropathy with ligamentum flavum thickening resulting in left subarticular zone effacement with possible impingement of the traversing L4 nerve root, and severe right and mild left neural foraminal stenosis. Findings are not significantly changed. L4-L5: Status post right laminectomy.  There is disc bulge and mild bilateral facet arthropathy with trace  effusions resulting in mild bilateral subarticular zone narrowing without evidence of nerve root impingement, and mild right worse than left neural foraminal stenosis. Findings are unchanged. L5-S1: Mild right worse than left facet arthropathy with a trace right effusion but no significant spinal canal or neural foraminal stenosis. IMPRESSION: 1. Overall, no significant interval change since the study from 09/01/2020. 2. Multifactorial severe spinal canal stenosis with cauda equina nerve root compression at L2-L3, unchanged. 3. Status post right laminectomy at L3-L4 with unchanged grade 1 anterolisthesis, disc bulge, and advanced left facet arthropathy resulting in persistent left subarticular zone effacement with possible impingement of the traversing L4 nerve root, and severe right neural foraminal stenosis, unchanged. 4. L4-L5: No other high-grade spinal canal or neural foraminal stenosis. No acute osseous finding. Electronically Signed   By: Lesia Hausen M.D.   On: 01/12/2023 16:43       Assessment & Plan:  Pre-op exam -     EKG 12-Lead  Need for influenza vaccination -     Flu vaccine trivalent PF, 6mos and older(Flulaval,Afluria,Fluarix,Fluzone)  Pre-op evaluation Assessment & Plan: Planning L3-5 posterior spinal decompression.  Reports no chest pain or sob reported.  No cough or congestion.  EKG - SR with no acute ischemic changes.  Aware to skip next dose of remicade.  Recommendation to resume 5 weeks after surgery.  Given above and no acute symptoms, recommend to proceed with planned surgery - low risk.  Will need close intra op and post op monitoring of heart rate and blood pressure to avoid extremes.    Rheumatoid arthritis, involving unspecified site, unspecified whether rheumatoid factor present [facility]) Assessment & Plan: Followed by rheumatology.  Has been receiving Remicade and MTX. Last dose remicade 12/22/22.  Skip  next dose.  And resume 5 weeks after surgery.    Meningioma [facility]) Assessment & Plan: MRI Brain without Contrast 04/23/2021: 1.3 cm dural-based mass along the anterior falx, likely reflecting a meningioma. No significant mass effect upon the adjacent frontal lobes. No adjacent parenchymal edema. Minimal chronic small-vessel ischemic changes within the cerebral white matter. Mild generalized parenchymal atrophy. Mild paranasal sinus disease, as described.  Saw neurology (Dr Sherryll Burger) -  felt was an incidental finding, no signs and symptoms suggestive of pressure, vasogenic edema. did not feel she needed surveillance scans.      Hypercholesterolemia Assessment & Plan: Continue lovastatin.  Follow lipid panel and liver function tests.    Essential hypertension Assessment & Plan: On diovan and diltiazem.   Blood pressures as outlined.  No changes. Follow metabolic panel.    Aortic atherosclerosis (HCC) Assessment & Plan: Continue lovastatin.      Dale Keener, MD

## 2023-01-21 NOTE — Progress Notes (Signed)
Order placed for add on lab.  °

## 2023-01-23 ENCOUNTER — Other Ambulatory Visit: Payer: Self-pay

## 2023-01-23 DIAGNOSIS — R7989 Other specified abnormal findings of blood chemistry: Secondary | ICD-10-CM

## 2023-01-25 ENCOUNTER — Encounter: Payer: Self-pay | Admitting: Internal Medicine

## 2023-01-25 DIAGNOSIS — Z01818 Encounter for other preprocedural examination: Secondary | ICD-10-CM | POA: Insufficient documentation

## 2023-01-25 NOTE — Assessment & Plan Note (Signed)
Continue lovastatin

## 2023-01-25 NOTE — Assessment & Plan Note (Signed)
Planning L3-5 posterior spinal decompression.  Reports no chest pain or sob reported.  No cough or congestion.  EKG - SR with no acute ischemic changes.  Aware to skip next dose of remicade.  Recommendation to resume 5 weeks after surgery.  Given above and no acute symptoms, recommend to proceed with planned surgery - low risk.  Will need close intra op and post op monitoring of heart rate and blood pressure to avoid extremes.

## 2023-01-25 NOTE — Assessment & Plan Note (Signed)
Followed by rheumatology.  Has been receiving Remicade and MTX. Last dose remicade 12/22/22.  Skip next dose.  And resume 5 weeks after surgery.

## 2023-01-25 NOTE — Assessment & Plan Note (Signed)
Continue lovastatin.  Follow lipid panel and liver function tests.

## 2023-01-25 NOTE — Assessment & Plan Note (Signed)
MRI Brain without Contrast 04/23/2021: 1.3 cm dural-based mass along the anterior falx, likely reflecting a meningioma. No significant mass effect upon the adjacent frontal lobes. No adjacent parenchymal edema. Minimal chronic small-vessel ischemic changes within the cerebral white matter. Mild generalized parenchymal atrophy. Mild paranasal sinus disease, as described.  Saw neurology (Dr Sherryll Burger) -  felt was an incidental finding, no signs and symptoms suggestive of pressure, vasogenic edema. did not feel she needed surveillance scans.

## 2023-01-25 NOTE — Assessment & Plan Note (Signed)
On diovan and diltiazem.   Blood pressures as outlined.  No changes. Follow metabolic panel.

## 2023-01-28 ENCOUNTER — Other Ambulatory Visit: Payer: Self-pay

## 2023-01-28 ENCOUNTER — Encounter
Admission: RE | Admit: 2023-01-28 | Discharge: 2023-01-28 | Disposition: A | Discharge status: Home / Self Care | Payer: Medicare Other | Source: Ambulatory Visit | Attending: Neurosurgery | Admitting: Neurosurgery

## 2023-01-28 VITALS — HR 58 | Resp 16 | Ht 61.0 in | Wt 92.4 lb

## 2023-01-28 DIAGNOSIS — Z01812 Encounter for preprocedural laboratory examination: Secondary | ICD-10-CM | POA: Insufficient documentation

## 2023-01-28 HISTORY — DX: Anemia, unspecified: D64.9

## 2023-01-28 HISTORY — DX: Benign neoplasm of meninges, unspecified: D32.9

## 2023-01-28 LAB — URINALYSIS, ROUTINE W REFLEX MICROSCOPIC
Bilirubin Urine: NEGATIVE
Glucose, UA: NEGATIVE mg/dL
Hgb urine dipstick: NEGATIVE
Ketones, ur: NEGATIVE mg/dL
Leukocytes,Ua: NEGATIVE
Nitrite: NEGATIVE
Protein, ur: NEGATIVE mg/dL
Specific Gravity, Urine: 1.01 (ref 1.005–1.030)
pH: 6 (ref 5.0–8.0)

## 2023-01-28 LAB — TYPE AND SCREEN
ABO/RH(D): O POS
Antibody Screen: NEGATIVE

## 2023-01-28 LAB — SURGICAL PCR SCREEN
MRSA, PCR: NEGATIVE
Staphylococcus aureus: NEGATIVE

## 2023-01-28 NOTE — Patient Instructions (Addendum)
Your procedure is scheduled on: 02/09/23 - Monday Report to the Registration Desk on the 1st floor of the [facility]. To find out your arrival time, please call [phone] between 1PM - 3PM on: 02/06/23 - Friday If your arrival time is 6:00 am, do not arrive before that time as the [facility] entrance doors do not open until 6:00 am.  REMEMBER: Instructions that are not followed completely may result in serious medical risk, up to and including death; or upon the discretion of your surgeon and anesthesiologist your surgery may need to be rescheduled.  Do not eat food after midnight the night before surgery.  No gum chewing or hard candies.  You may however, drink CLEAR liquids up to 2 hours before you are scheduled to arrive for your surgery. Do not drink anything within 2 hours of your scheduled arrival time.  Clear liquids include: - water  - apple juice without pulp - gatorade (not RED colors) - black coffee or tea (Do NOT add milk or creamers to the coffee or tea) Do NOT drink anything that is not on this list.   You may continue if needed, Anti-inflammatories (NSAIDS) such as Advil, Aleve, Ibuprofen, Motrin, Naproxen, Naprosyn and Aspirin based products such as Excedrin, Goody's Powder, BC Powder.  Stop on 02/03/23, ANY OVER THE COUNTER supplements until after surgery : folic acid (FOLVITE) ,Cholecalciferol (VITAMIN D), PRESERVISION AREDS , MAGNESIUM OXIDE .  You may however, continue to take Tylenol if needed for pain up until the day of surgery.   TAKE ONLY THESE MEDICATIONS THE MORNING OF SURGERY WITH A SIP OF WATER:  omeprazole (PRILOSEC) - (take one the night before and one on the morning of surgery - helps to prevent nausea after surgery.) diltiazem (DILT-XR)  dorzolamide-timolol (COSOPT)  prednisoLONE acetate (PRED FORTE)    Aware to skip next dose of remicade.  Recommendation to resume 5 weeks after surgery.  Last dose 12/22/22.      No Alcohol for 24  hours before or after surgery.  No Smoking including e-cigarettes for 24 hours before surgery.  No chewable tobacco products for at least 6 hours before surgery.  No nicotine patches on the day of surgery.  Do not use any "recreational" drugs for at least a week (preferably 2 weeks) before your surgery.  Please be advised that the combination of cocaine and anesthesia may have negative outcomes, up to and including death. If you test positive for cocaine, your surgery will be cancelled.  On the morning of surgery brush your teeth with toothpaste and water, you may rinse your mouth with mouthwash if you wish. Do not swallow any toothpaste or mouthwash.  Use CHG Soap or wipes as directed on instruction sheet.  Do not wear jewelry, make-up, hairpins, clips or nail polish.  For welded (permanent) jewelry: bracelets, anklets, waist bands, etc.  Please have this removed prior to surgery.  If it is not removed, there is a chance that hospital personnel will need to cut it off on the day of surgery.  Do not wear lotions, powders, or perfumes.   Contact lenses, hearing aids and dentures may not be worn into surgery.  Do not bring valuables to the hospital. [facility] is not responsible for any missing/lost belongings or valuables.   Notify your doctor if there is any change in your medical condition (cold, fever, infection).  Wear comfortable clothing (specific to your surgery type) to the hospital.  After surgery, you can help prevent lung  complications by doing breathing exercises.  Take deep breaths and cough every 1-2 hours. Your doctor may order a device called an Incentive Spirometer to help you take deep breaths. When coughing or sneezing, hold a pillow firmly against your incision with both hands. This is called "splinting." Doing this helps protect your incision. It also decreases belly discomfort.  If you are being admitted to the hospital overnight, leave your suitcase in the car.  After surgery it may be brought to your room.  In case of increased patient census, it may be necessary for you, the patient, to continue your postoperative care in the Same Day Surgery department.  If you are being discharged the day of surgery, you will not be allowed to drive home. You will need a responsible individual to drive you home and stay with you for 24 hours after surgery.   If you are taking public transportation, you will need to have a responsible individual with you.  Please call the Pre-admissions Testing Dept. at [phone] if you have any questions about these instructions.  Surgery Visitation Policy:  Patients having surgery or a procedure may have two visitors.  Children under the age of 80 must have an adult with them who is not the patient.  Inpatient Visitation:    Visiting hours are 7 a.m. to 8 p.m. Up to four visitors are allowed at one time in a patient room. The visitors may rotate out with other people during the day.  One visitor age 52 or older may stay with the patient overnight and must be in the room by 8 p.m.    Pre-operative 5 CHG Bath Instructions   You can play a key role in reducing the risk of infection after surgery. Your skin needs to be as free of germs as possible. You can reduce the number of germs on your skin by washing with CHG (chlorhexidine gluconate) soap before surgery. CHG is an antiseptic soap that kills germs and continues to kill germs even after washing.   DO NOT use if you have an allergy to chlorhexidine/CHG or antibacterial soaps. If your skin becomes reddened or irritated, stop using the CHG and notify one of our RNs at [phone].   Please shower with the CHG soap starting 4 days before surgery using the following schedule:  10/03 - 10/07.    Please keep in mind the following:  DO NOT shave, including legs and underarms, starting the day of your first shower.   You may shave your face at any point before/day of  surgery.  Place clean sheets on your bed the day you start using CHG soap. Use a clean washcloth (not used since being washed) for each shower. DO NOT sleep with pets once you start using the CHG.   CHG Shower Instructions:  If you choose to wash your hair and private area, wash first with your normal shampoo/soap.  After you use shampoo/soap, rinse your hair and body thoroughly to remove shampoo/soap residue.  Turn the water OFF and apply about 3 tablespoons (45 ml) of CHG soap to a CLEAN washcloth.  Apply CHG soap ONLY FROM YOUR NECK DOWN TO YOUR TOES (washing for 3-5 minutes)  DO NOT use CHG soap on face, private areas, open wounds, or sores.  Pay special attention to the area where your surgery is being performed.  If you are having back surgery, having someone wash your back for you may be helpful. Wait 2 minutes after CHG soap is  applied, then you may rinse off the CHG soap.  Pat dry with a clean towel  Put on clean clothes/pajamas   If you choose to wear lotion, please use ONLY the CHG-compatible lotions on the back of this paper.     Additional instructions for the day of surgery: DO NOT APPLY any lotions, deodorants, cologne, or perfumes.   Put on clean/comfortable clothes.  Brush your teeth.  Ask your nurse before applying any prescription medications to the skin.      CHG Compatible Lotions   Aveeno Moisturizing lotion  Cetaphil Moisturizing Cream  Cetaphil Moisturizing Lotion  Clairol Herbal Essence Moisturizing Lotion, Dry Skin  Clairol Herbal Essence Moisturizing Lotion, Extra Dry Skin  Clairol Herbal Essence Moisturizing Lotion, Normal Skin  Curel Age Defying Therapeutic Moisturizing Lotion with Alpha Hydroxy  Curel Extreme Care Body Lotion  Curel Soothing Hands Moisturizing Hand Lotion  Curel Therapeutic Moisturizing Cream, Fragrance-Free  Curel Therapeutic Moisturizing Lotion, Fragrance-Free  Curel Therapeutic Moisturizing Lotion, Original Formula  Eucerin  Daily Replenishing Lotion  Eucerin Dry Skin Therapy Plus Alpha Hydroxy Crme  Eucerin Dry Skin Therapy Plus Alpha Hydroxy Lotion  Eucerin Original Crme  Eucerin Original Lotion  Eucerin Plus Crme Eucerin Plus Lotion  Eucerin TriLipid Replenishing Lotion  Keri Anti-Bacterial Hand Lotion  Keri Deep Conditioning Original Lotion Dry Skin Formula Softly Scented  Keri Deep Conditioning Original Lotion, Fragrance Free Sensitive Skin Formula  Keri Lotion Fast Absorbing Fragrance Free Sensitive Skin Formula  Keri Lotion Fast Absorbing Softly Scented Dry Skin Formula  Keri Original Lotion  Keri Skin Renewal Lotion Keri Silky Smooth Lotion  Keri Silky Smooth Sensitive Skin Lotion  Nivea Body Creamy Conditioning Oil  Nivea Body Extra Enriched Teacher, adult education Moisturizing Lotion Nivea Crme  Nivea Skin Firming Lotion  NutraDerm 30 Skin Lotion  NutraDerm Skin Lotion  NutraDerm Therapeutic Skin Cream  NutraDerm Therapeutic Skin Lotion  ProShield Protective Hand Cream  Provon moisturizing lotion

## 2023-01-28 NOTE — Pre-Procedure Instructions (Signed)
Bp elevated today, 196/66, ppt denies any symptoms, recent BP's WNL, advised to FU with PCP, pt voices that she will call her MD.

## 2023-02-05 ENCOUNTER — Ambulatory Visit: Payer: Medicare Other | Admitting: Neurosurgery

## 2023-02-06 ENCOUNTER — Other Ambulatory Visit (INDEPENDENT_AMBULATORY_CARE_PROVIDER_SITE_OTHER): Payer: Medicare Other

## 2023-02-06 DIAGNOSIS — R7989 Other specified abnormal findings of blood chemistry: Secondary | ICD-10-CM | POA: Diagnosis not present

## 2023-02-06 LAB — CBC WITH DIFFERENTIAL/PLATELET
Basophils Absolute: 0 10*3/uL (ref 0.0–0.1)
Basophils Relative: 0.4 % (ref 0.0–3.0)
Eosinophils Absolute: 0.2 10*3/uL (ref 0.0–0.7)
Eosinophils Relative: 5.1 % — ABNORMAL HIGH (ref 0.0–5.0)
HCT: 35.2 % — ABNORMAL LOW (ref 36.0–46.0)
Hemoglobin: 11.4 g/dL — ABNORMAL LOW (ref 12.0–15.0)
Lymphocytes Relative: 47.3 % — ABNORMAL HIGH (ref 12.0–46.0)
Lymphs Abs: 2.1 10*3/uL (ref 0.7–4.0)
MCHC: 32.4 g/dL (ref 30.0–36.0)
MCV: 97.9 fL (ref 78.0–100.0)
Monocytes Absolute: 0.7 10*3/uL (ref 0.1–1.0)
Monocytes Relative: 15.1 % — ABNORMAL HIGH (ref 3.0–12.0)
Neutro Abs: 1.4 10*3/uL (ref 1.4–7.7)
Neutrophils Relative %: 32.1 % — ABNORMAL LOW (ref 43.0–77.0)
Platelets: 244 10*3/uL (ref 150.0–400.0)
RBC: 3.6 Mil/uL — ABNORMAL LOW (ref 3.87–5.11)
RDW: 17 % — ABNORMAL HIGH (ref 11.5–15.5)
WBC: 4.3 10*3/uL (ref 4.0–10.5)

## 2023-02-06 LAB — IBC + FERRITIN
Ferritin: 288.7 ng/mL (ref 10.0–291.0)
Iron: 110 ug/dL (ref 42–145)
Saturation Ratios: 38.1 % (ref 20.0–50.0)
TIBC: 288.4 ug/dL (ref 250.0–450.0)
Transferrin: 206 mg/dL — ABNORMAL LOW (ref 212.0–360.0)

## 2023-02-09 ENCOUNTER — Encounter: Payer: Self-pay | Admitting: Neurosurgery

## 2023-02-09 ENCOUNTER — Encounter
Admission: RE | Disposition: A | Discharge status: Home Health | Payer: Self-pay | Source: Ambulatory Visit | Attending: Neurosurgery

## 2023-02-09 ENCOUNTER — Ambulatory Visit: Payer: Medicare Other | Admitting: Certified Registered"

## 2023-02-09 ENCOUNTER — Observation Stay
Admission: RE | Admit: 2023-02-09 | Discharge: 2023-02-10 | Disposition: A | Discharge status: Home Health | Payer: Medicare Other | Source: Ambulatory Visit | Attending: Neurosurgery | Admitting: Neurosurgery

## 2023-02-09 ENCOUNTER — Other Ambulatory Visit: Payer: Self-pay

## 2023-02-09 ENCOUNTER — Ambulatory Visit: Payer: Medicare Other

## 2023-02-09 ENCOUNTER — Other Ambulatory Visit: Payer: Self-pay | Admitting: *Deleted

## 2023-02-09 ENCOUNTER — Ambulatory Visit: Payer: Medicare Other | Admitting: Urgent Care

## 2023-02-09 DIAGNOSIS — M48062 Spinal stenosis, lumbar region with neurogenic claudication: Principal | ICD-10-CM | POA: Insufficient documentation

## 2023-02-09 DIAGNOSIS — Z01818 Encounter for other preprocedural examination: Secondary | ICD-10-CM

## 2023-02-09 DIAGNOSIS — I10 Essential (primary) hypertension: Secondary | ICD-10-CM | POA: Diagnosis not present

## 2023-02-09 DIAGNOSIS — M48061 Spinal stenosis, lumbar region without neurogenic claudication: Principal | ICD-10-CM | POA: Diagnosis present

## 2023-02-09 DIAGNOSIS — D649 Anemia, unspecified: Secondary | ICD-10-CM

## 2023-02-09 DIAGNOSIS — Z981 Arthrodesis status: Secondary | ICD-10-CM | POA: Diagnosis not present

## 2023-02-09 DIAGNOSIS — Z79899 Other long term (current) drug therapy: Secondary | ICD-10-CM | POA: Insufficient documentation

## 2023-02-09 DIAGNOSIS — R7989 Other specified abnormal findings of blood chemistry: Secondary | ICD-10-CM

## 2023-02-09 HISTORY — PX: LUMBAR LAMINECTOMY/DECOMPRESSION MICRODISCECTOMY: SHX5026

## 2023-02-09 SURGERY — LUMBAR LAMINECTOMY/DECOMPRESSION MICRODISCECTOMY 2 LEVELS
Anesthesia: General | Site: Spine Lumbar | Laterality: Left

## 2023-02-09 MED ORDER — LACTATED RINGERS IV SOLN: INTRAVENOUS | Status: DC

## 2023-02-09 MED ORDER — POLYETHYLENE GLYCOL 3350 17 G PO PACK: 17.0000 g | PACK | Freq: Every day | ORAL | Status: DC | PRN

## 2023-02-09 MED ORDER — METHYLPREDNISOLONE ACETATE 40 MG/ML IJ SUSP
INTRAMUSCULAR | Status: AC
  Filled 2023-02-09: qty 1

## 2023-02-09 MED ORDER — ACETAMINOPHEN 325 MG PO TABS
ORAL_TABLET | ORAL | Status: AC
  Filled 2023-02-09: qty 2

## 2023-02-09 MED ORDER — DOCUSATE SODIUM 100 MG PO CAPS
100.0000 mg | ORAL_CAPSULE | Freq: Two times a day (BID) | ORAL | Status: DC
  Administered 2023-02-09 – 2023-02-10 (×2): 100 mg via ORAL

## 2023-02-09 MED ORDER — ORAL CARE MOUTH RINSE: 15.0000 mL | Freq: Once | OROMUCOSAL | Status: AC

## 2023-02-09 MED ORDER — SUCCINYLCHOLINE CHLORIDE 200 MG/10ML IV SOSY
PREFILLED_SYRINGE | INTRAVENOUS | Status: DC | PRN
  Administered 2023-02-09: 60 mg via INTRAVENOUS

## 2023-02-09 MED ORDER — FENTANYL CITRATE (PF) 100 MCG/2ML IJ SOLN
25.0000 ug | INTRAMUSCULAR | Status: DC | PRN
  Administered 2023-02-09 (×3): 25 ug via INTRAVENOUS

## 2023-02-09 MED ORDER — GABAPENTIN 300 MG PO CAPS
300.0000 mg | ORAL_CAPSULE | Freq: Every day | ORAL | Status: DC
  Administered 2023-02-09: 300 mg via ORAL

## 2023-02-09 MED ORDER — SODIUM CHLORIDE (PF) 0.9 % IJ SOLN
INTRAMUSCULAR | Status: DC | PRN
  Administered 2023-02-09: 60 mL via INTRAMUSCULAR

## 2023-02-09 MED ORDER — PHENOL 1.4 % MT LIQD: 1.0000 | OROMUCOSAL | Status: DC | PRN

## 2023-02-09 MED ORDER — METHYLPREDNISOLONE ACETATE 40 MG/ML IJ SUSP
INTRAMUSCULAR | Status: DC | PRN
  Administered 2023-02-09: 40 mg

## 2023-02-09 MED ORDER — ONDANSETRON HCL 4 MG/2ML IJ SOLN
INTRAMUSCULAR | Status: DC | PRN
  Administered 2023-02-09: 4 mg via INTRAVENOUS

## 2023-02-09 MED ORDER — SURGIFLO WITH THROMBIN (HEMOSTATIC MATRIX KIT) OPTIME
TOPICAL | Status: DC | PRN
  Administered 2023-02-09: 1 via TOPICAL

## 2023-02-09 MED ORDER — CHLORHEXIDINE GLUCONATE 0.12 % MT SOLN
OROMUCOSAL | Status: AC
  Filled 2023-02-09: qty 15

## 2023-02-09 MED ORDER — PHENYLEPHRINE HCL (PRESSORS) 10 MG/ML IV SOLN
INTRAVENOUS | Status: AC
  Filled 2023-02-09: qty 1

## 2023-02-09 MED ORDER — BUPIVACAINE HCL (PF) 0.5 % IJ SOLN
INTRAMUSCULAR | Status: AC
  Filled 2023-02-09: qty 30

## 2023-02-09 MED ORDER — DOCUSATE SODIUM 100 MG PO CAPS
ORAL_CAPSULE | ORAL | Status: AC
  Filled 2023-02-09: qty 1

## 2023-02-09 MED ORDER — FENTANYL CITRATE (PF) 100 MCG/2ML IJ SOLN
INTRAMUSCULAR | Status: AC
  Filled 2023-02-09: qty 2

## 2023-02-09 MED ORDER — SENNA 8.6 MG PO TABS
1.0000 | ORAL_TABLET | Freq: Two times a day (BID) | ORAL | Status: DC
  Administered 2023-02-09 – 2023-02-10 (×2): 8.6 mg via ORAL

## 2023-02-09 MED ORDER — PHENYLEPHRINE 80 MCG/ML (10ML) SYRINGE FOR IV PUSH (FOR BLOOD PRESSURE SUPPORT)
PREFILLED_SYRINGE | INTRAVENOUS | Status: DC | PRN
  Administered 2023-02-09: 80 ug via INTRAVENOUS
  Administered 2023-02-09 (×2): 40 ug via INTRAVENOUS

## 2023-02-09 MED ORDER — MENTHOL 3 MG MT LOZG
1.0000 | LOZENGE | OROMUCOSAL | Status: DC | PRN
  Administered 2023-02-09: 3 mg via ORAL
  Filled 2023-02-09: qty 9

## 2023-02-09 MED ORDER — MIDAZOLAM HCL 2 MG/2ML IJ SOLN
INTRAMUSCULAR | Status: AC
  Filled 2023-02-09: qty 2

## 2023-02-09 MED ORDER — ACETAMINOPHEN 500 MG PO TABS: 1000.0000 mg | ORAL_TABLET | Freq: Four times a day (QID) | ORAL | Status: DC

## 2023-02-09 MED ORDER — OXYCODONE HCL 5 MG PO TABS
ORAL_TABLET | ORAL | Status: AC
  Filled 2023-02-09: qty 1

## 2023-02-09 MED ORDER — METHOCARBAMOL 500 MG PO TABS
ORAL_TABLET | ORAL | Status: AC
  Filled 2023-02-09: qty 1

## 2023-02-09 MED ORDER — GABAPENTIN 300 MG PO CAPS
ORAL_CAPSULE | ORAL | Status: AC
  Filled 2023-02-09: qty 1

## 2023-02-09 MED ORDER — OXYCODONE HCL 5 MG PO TABS: 5.0000 mg | ORAL_TABLET | Freq: Once | ORAL | Status: DC | PRN

## 2023-02-09 MED ORDER — CHLORHEXIDINE GLUCONATE 0.12 % MT SOLN
15.0000 mL | Freq: Once | OROMUCOSAL | Status: AC
  Administered 2023-02-09: 15 mL via OROMUCOSAL

## 2023-02-09 MED ORDER — FOLIC ACID 1 MG PO TABS
1.0000 mg | ORAL_TABLET | Freq: Every day | ORAL | Status: DC
  Administered 2023-02-10: 1 mg via ORAL
  Filled 2023-02-09: qty 1

## 2023-02-09 MED ORDER — ACETAMINOPHEN 10 MG/ML IV SOLN
INTRAVENOUS | Status: AC
  Filled 2023-02-09: qty 100

## 2023-02-09 MED ORDER — ENOXAPARIN SODIUM 40 MG/0.4ML IJ SOSY
40.0000 mg | PREFILLED_SYRINGE | INTRAMUSCULAR | Status: DC
  Administered 2023-02-10: 40 mg via SUBCUTANEOUS

## 2023-02-09 MED ORDER — DILTIAZEM HCL ER 120 MG PO TB24
240.0000 mg | ORAL_TABLET | Freq: Every day | ORAL | Status: DC
  Administered 2023-02-10: 240 mg via ORAL
  Filled 2023-02-09: qty 2

## 2023-02-09 MED ORDER — METHOCARBAMOL 500 MG PO TABS
500.0000 mg | ORAL_TABLET | Freq: Four times a day (QID) | ORAL | Status: DC | PRN
  Administered 2023-02-09: 500 mg via ORAL

## 2023-02-09 MED ORDER — ACETAMINOPHEN 325 MG PO TABS
650.0000 mg | ORAL_TABLET | Freq: Four times a day (QID) | ORAL | Status: DC
  Administered 2023-02-09 – 2023-02-10 (×3): 650 mg via ORAL

## 2023-02-09 MED ORDER — TRAMADOL HCL 50 MG PO TABS
ORAL_TABLET | ORAL | Status: AC
  Filled 2023-02-09: qty 1

## 2023-02-09 MED ORDER — EPHEDRINE 5 MG/ML INJ
INTRAVENOUS | Status: AC
  Filled 2023-02-09: qty 5

## 2023-02-09 MED ORDER — BUPIVACAINE LIPOSOME 1.3 % IJ SUSP
INTRAMUSCULAR | Status: AC
  Filled 2023-02-09: qty 20

## 2023-02-09 MED ORDER — PHENYLEPHRINE HCL-NACL 20-0.9 MG/250ML-% IV SOLN
INTRAVENOUS | Status: DC | PRN
Start: 2023-02-09 — End: 2023-02-09
  Administered 2023-02-09: 40 ug/min via INTRAVENOUS

## 2023-02-09 MED ORDER — BUPIVACAINE-EPINEPHRINE (PF) 0.5% -1:200000 IJ SOLN
INTRAMUSCULAR | Status: AC
  Filled 2023-02-09: qty 10

## 2023-02-09 MED ORDER — SODIUM CHLORIDE 0.9% FLUSH: 3.0000 mL | INTRAVENOUS | Status: DC | PRN

## 2023-02-09 MED ORDER — TRAMADOL HCL 50 MG PO TABS
50.0000 mg | ORAL_TABLET | Freq: Four times a day (QID) | ORAL | Status: DC
  Administered 2023-02-09 – 2023-02-10 (×3): 50 mg via ORAL

## 2023-02-09 MED ORDER — BISACODYL 10 MG RE SUPP: 10.0000 mg | Freq: Every day | RECTAL | Status: DC | PRN

## 2023-02-09 MED ORDER — OXYCODONE HCL 5 MG PO TABS: 5.0000 mg | ORAL_TABLET | ORAL | Status: DC | PRN

## 2023-02-09 MED ORDER — LIDOCAINE HCL (CARDIAC) PF 100 MG/5ML IV SOSY
PREFILLED_SYRINGE | INTRAVENOUS | Status: DC | PRN
  Administered 2023-02-09: 40 mg via INTRAVENOUS

## 2023-02-09 MED ORDER — SODIUM CHLORIDE 0.9 % IV SOLN: 250.0000 mL | INTRAVENOUS | Status: DC

## 2023-02-09 MED ORDER — MAGNESIUM CITRATE PO SOLN: 1.0000 | Freq: Once | ORAL | Status: DC | PRN

## 2023-02-09 MED ORDER — DEXAMETHASONE SODIUM PHOSPHATE 10 MG/ML IJ SOLN
INTRAMUSCULAR | Status: DC | PRN
  Administered 2023-02-09: 10 mg via INTRAVENOUS

## 2023-02-09 MED ORDER — SODIUM CHLORIDE 0.9% FLUSH
3.0000 mL | Freq: Two times a day (BID) | INTRAVENOUS | Status: DC
  Administered 2023-02-09: 3 mL via INTRAVENOUS

## 2023-02-09 MED ORDER — OXYCODONE HCL 5 MG/5ML PO SOLN: 5.0000 mg | Freq: Once | ORAL | Status: DC | PRN

## 2023-02-09 MED ORDER — DORZOLAMIDE HCL-TIMOLOL MAL 2-0.5 % OP SOLN
1.0000 [drp] | Freq: Two times a day (BID) | OPHTHALMIC | Status: DC
  Administered 2023-02-09 – 2023-02-10 (×2): 1 [drp] via OPHTHALMIC
  Filled 2023-02-09: qty 10

## 2023-02-09 MED ORDER — CEFAZOLIN SODIUM-DEXTROSE 2-4 GM/100ML-% IV SOLN
INTRAVENOUS | Status: AC
  Filled 2023-02-09: qty 100

## 2023-02-09 MED ORDER — FENTANYL CITRATE (PF) 100 MCG/2ML IJ SOLN
INTRAMUSCULAR | Status: DC | PRN
  Administered 2023-02-09 (×2): 25 ug via INTRAVENOUS
  Administered 2023-02-09: 50 ug via INTRAVENOUS

## 2023-02-09 MED ORDER — IRBESARTAN 150 MG PO TABS
150.0000 mg | ORAL_TABLET | Freq: Every day | ORAL | Status: DC
  Administered 2023-02-10: 150 mg via ORAL
  Filled 2023-02-09: qty 1

## 2023-02-09 MED ORDER — SENNA 8.6 MG PO TABS
ORAL_TABLET | ORAL | Status: AC
  Filled 2023-02-09: qty 1

## 2023-02-09 MED ORDER — CEFAZOLIN SODIUM-DEXTROSE 2-4 GM/100ML-% IV SOLN
2.0000 g | Freq: Once | INTRAVENOUS | Status: AC
  Administered 2023-02-09: 2 g via INTRAVENOUS

## 2023-02-09 MED ORDER — METHOCARBAMOL 1000 MG/10ML IJ SOLN: 500.0000 mg | Freq: Four times a day (QID) | INTRAVENOUS | Status: DC | PRN

## 2023-02-09 MED ORDER — REMIFENTANIL HCL 1 MG IV SOLR
INTRAVENOUS | Status: AC
  Filled 2023-02-09: qty 1000

## 2023-02-09 MED ORDER — PRAVASTATIN SODIUM 40 MG PO TABS: 40.0000 mg | ORAL_TABLET | Freq: Every day | ORAL | Status: DC

## 2023-02-09 MED ORDER — BUPIVACAINE-EPINEPHRINE (PF) 0.5% -1:200000 IJ SOLN
INTRAMUSCULAR | Status: DC | PRN
  Administered 2023-02-09: 10 mL

## 2023-02-09 MED ORDER — 0.9 % SODIUM CHLORIDE (POUR BTL) OPTIME
TOPICAL | Status: DC | PRN
  Administered 2023-02-09: 500 mL

## 2023-02-09 MED ORDER — EPHEDRINE SULFATE (PRESSORS) 50 MG/ML IJ SOLN
INTRAMUSCULAR | Status: DC | PRN
  Administered 2023-02-09: 2.5 mg via INTRAVENOUS
  Administered 2023-02-09: 5 mg via INTRAVENOUS

## 2023-02-09 MED ORDER — ONDANSETRON HCL 4 MG/2ML IJ SOLN
4.0000 mg | Freq: Four times a day (QID) | INTRAMUSCULAR | Status: DC | PRN
  Administered 2023-02-10: 4 mg via INTRAVENOUS

## 2023-02-09 MED ORDER — PANTOPRAZOLE SODIUM 40 MG PO TBEC
40.0000 mg | DELAYED_RELEASE_TABLET | Freq: Every day | ORAL | Status: DC
  Administered 2023-02-10: 40 mg via ORAL

## 2023-02-09 MED ORDER — PROPOFOL 10 MG/ML IV BOLUS
INTRAVENOUS | Status: DC | PRN
  Administered 2023-02-09: 60 mg via INTRAVENOUS

## 2023-02-09 MED ORDER — LATANOPROST 0.005 % OP SOLN
1.0000 [drp] | Freq: Every day | OPHTHALMIC | Status: DC
  Administered 2023-02-09: 1 [drp] via OPHTHALMIC
  Filled 2023-02-09: qty 2.5

## 2023-02-09 MED ORDER — OXYCODONE HCL 5 MG PO TABS: 10.0000 mg | ORAL_TABLET | ORAL | Status: DC | PRN

## 2023-02-09 MED ORDER — REMIFENTANIL HCL 1 MG IV SOLR
INTRAVENOUS | Status: DC | PRN
Start: 2023-02-09 — End: 2023-02-09
  Administered 2023-02-09: .06 ug/kg/min via INTRAVENOUS

## 2023-02-09 MED ORDER — SODIUM CHLORIDE 0.9 % IV SOLN: INTRAVENOUS | Status: DC

## 2023-02-09 MED ORDER — SODIUM CHLORIDE (PF) 0.9 % IJ SOLN
INTRAMUSCULAR | Status: AC
  Filled 2023-02-09: qty 20

## 2023-02-09 MED ORDER — MIDAZOLAM HCL 2 MG/2ML IJ SOLN
INTRAMUSCULAR | Status: DC | PRN
  Administered 2023-02-09: 1 mg via INTRAVENOUS

## 2023-02-09 MED ORDER — ACETAMINOPHEN 650 MG RE SUPP: 650.0000 mg | RECTAL | Status: DC | PRN

## 2023-02-09 MED ORDER — ONDANSETRON HCL 4 MG PO TABS: 4.0000 mg | ORAL_TABLET | Freq: Four times a day (QID) | ORAL | Status: DC | PRN

## 2023-02-09 MED ORDER — PREDNISOLONE ACETATE 1 % OP SUSP
1.0000 [drp] | Freq: Every day | OPHTHALMIC | Status: DC
  Administered 2023-02-10: 1 [drp] via OPHTHALMIC
  Filled 2023-02-09: qty 1

## 2023-02-09 MED ORDER — ACETAMINOPHEN 10 MG/ML IV SOLN
1000.0000 mg | Freq: Once | INTRAVENOUS | Status: DC | PRN
  Administered 2023-02-09: 1000 mg via INTRAVENOUS

## 2023-02-09 MED ORDER — ACETAMINOPHEN 325 MG PO TABS: 650.0000 mg | ORAL_TABLET | ORAL | Status: DC | PRN

## 2023-02-09 MED ORDER — DROPERIDOL 2.5 MG/ML IJ SOLN: 0.6250 mg | Freq: Once | INTRAMUSCULAR | Status: DC | PRN

## 2023-02-09 SURGICAL SUPPLY — 40 items
ADH SKN CLS APL DERMABOND .7 (GAUZE/BANDAGES/DRESSINGS) ×1
AGENT HMST KT MTR STRL THRMB (HEMOSTASIS) ×1
BASIN KIT SINGLE STR (MISCELLANEOUS) ×1 IMPLANT
BUR NEURO DRILL SOFT 3.0X3.8M (BURR) ×1 IMPLANT
CNTNR URN SCR LID CUP LEK RST (MISCELLANEOUS) ×1 IMPLANT
CONT SPEC 4OZ STRL OR WHT (MISCELLANEOUS) ×1
DERMABOND ADVANCED .7 DNX12 (GAUZE/BANDAGES/DRESSINGS) ×1 IMPLANT
DRAPE C ARM PK CFD 31 SPINE (DRAPES) ×1 IMPLANT
DRAPE LAPAROTOMY 100X77 ABD (DRAPES) ×1 IMPLANT
DRAPE MICROSCOPE SPINE 48X150 (DRAPES) IMPLANT
ELECT EZSTD 165MM 6.5IN (MISCELLANEOUS) ×1
ELECT REM PT RETURN 9FT ADLT (ELECTROSURGICAL) ×1
ELECTRODE EZSTD 165MM 6.5IN (MISCELLANEOUS) ×1 IMPLANT
ELECTRODE REM PT RTRN 9FT ADLT (ELECTROSURGICAL) ×1 IMPLANT
EVACUATOR 1/8 PVC DRAIN (DRAIN) IMPLANT
GLOVE BIOGEL PI IND STRL 6.5 (GLOVE) ×1 IMPLANT
GLOVE SURG SYN 6.5 ES PF (GLOVE) ×1 IMPLANT
GLOVE SURG SYN 6.5 PF PI (GLOVE) ×1 IMPLANT
GLOVE SURG SYN 8.5 E (GLOVE) ×3 IMPLANT
GLOVE SURG SYN 8.5 PF PI (GLOVE) ×3 IMPLANT
GOWN SRG LRG LVL 4 IMPRV REINF (GOWNS) ×1 IMPLANT
GOWN SRG XL LVL 3 NONREINFORCE (GOWNS) ×1 IMPLANT
GOWN STRL NON-REIN TWL XL LVL3 (GOWNS) ×1
GOWN STRL REIN LRG LVL4 (GOWNS) ×1
KIT SPINAL PRONEVIEW (KITS) ×1 IMPLANT
MANIFOLD NEPTUNE II (INSTRUMENTS) ×1 IMPLANT
MARKER SKIN DUAL TIP RULER LAB (MISCELLANEOUS) ×1 IMPLANT
NDL SAFETY ECLIP 18X1.5 (MISCELLANEOUS) ×1 IMPLANT
NS IRRIG 1000ML POUR BTL (IV SOLUTION) ×1 IMPLANT
NS IRRIG 500ML POUR BTL (IV SOLUTION) IMPLANT
PACK LAMINECTOMY ARMC (PACKS) ×1 IMPLANT
SURGIFLO W/THROMBIN 8M KIT (HEMOSTASIS) ×1 IMPLANT
SUT DVC VLOC 3-0 CL 6 P-12 (SUTURE) ×1 IMPLANT
SUT VIC AB 0 CT1 27 (SUTURE) ×1
SUT VIC AB 0 CT1 27XCR 8 STRN (SUTURE) ×1 IMPLANT
SUT VIC AB 2-0 CT1 18 (SUTURE) ×1 IMPLANT
SYR 30ML LL (SYRINGE) ×2 IMPLANT
SYR 3ML LL SCALE MARK (SYRINGE) ×1 IMPLANT
TRAP FLUID SMOKE EVACUATOR (MISCELLANEOUS) ×1 IMPLANT
WATER STERILE IRR 1000ML POUR (IV SOLUTION) ×2 IMPLANT

## 2023-02-09 NOTE — Interval H&P Note (Signed)
History and Physical Interval Note:  02/09/2023 1:01 PM  Stacey Snyder  has presented today for surgery, with the diagnosis of M48.062  Lumbar stenosis with neurogenic claudication.  The various methods of treatment have been discussed with the patient and family. After consideration of risks, benefits and other options for treatment, the patient has consented to  Procedure(s): L3-5 POSTERIOR SPINAL DECOMPRESSION (Left) as a surgical intervention.  The patient's history has been reviewed, patient examined, no change in status, stable for surgery.  I have reviewed the patient's chart and labs.  Questions were answered to the patient's satisfaction.    Heart sounds normal no MRG. Chest Clear to Auscultation Bilaterally.   Amaal Dimartino

## 2023-02-09 NOTE — Anesthesia Procedure Notes (Signed)
Procedure Name: Intubation Date/Time: 02/09/2023 1:40 PM  Performed by: Berniece Pap, CRNAPre-anesthesia Checklist: Patient identified, Emergency Drugs available, Suction available and Patient being monitored Patient Re-evaluated:Patient Re-evaluated prior to induction Oxygen Delivery Method: Circle system utilized Preoxygenation: Pre-oxygenation with 100% oxygen Induction Type: IV induction Ventilation: Mask ventilation without difficulty Tube type: Oral Tube size: 7.0 mm Number of attempts: 1 Airway Equipment and Method: Stylet and Oral airway Placement Confirmation: ETT inserted through vocal cords under direct vision, positive ETCO2 and breath sounds checked- equal and bilateral Secured at: 21 cm Tube secured with: Tape Dental Injury: Teeth and Oropharynx as per pre-operative assessment

## 2023-02-09 NOTE — Discharge Instructions (Signed)
Your surgeon has performed an operation on your lumbar spine (low back) to relieve pressure on one or more nerves. Many times, patients feel better immediately after surgery and can "overdo it." Even if you feel well, it is important that you follow these activity guidelines. If you do not let your back heal properly from the surgery, you can increase the chance of a disc herniation and/or return of your symptoms. The following are instructions to help in your recovery once you have been discharged from the hospital.  * It is ok to take NSAIDs after surgery.  Activity    No bending, lifting, or twisting ("BLT"). Avoid lifting objects heavier than 10 pounds (gallon milk jug).  Where possible, avoid household activities that involve lifting, bending, pushing, or pulling such as laundry, vacuuming, grocery shopping, and childcare. Try to arrange for help from friends and family for these activities while your back heals.  Increase physical activity slowly as tolerated.  Taking short walks is encouraged, but avoid strenuous exercise. Do not jog, run, bicycle, lift weights, or participate in any other exercises unless specifically allowed by your doctor. Avoid prolonged sitting, including car rides.  Talk to your doctor before resuming sexual activity.  You should not drive until cleared by your doctor.  Until released by your doctor, you should not return to work or school.  You should rest at home and let your body heal.   You may shower three days after your surgery.  After showering, lightly dab your incision dry. Do not take a tub bath or go swimming for 3 weeks, or until approved by your doctor at your follow-up appointment.  If you smoke, we strongly recommend that you quit.  Smoking has been proven to interfere with normal healing in your back and will dramatically reduce the success rate of your surgery. Please contact [facility] (800-QUIT-NOW) and use the resources at www.[facility].com for  assistance in stopping smoking.  Surgical Incision   If you have a dressing on your incision, you may remove it three days after your surgery. Keep your incision area clean and dry.  If you have staples or stitches on your incision, you should have a follow up scheduled for removal. If you do not have staples or stitches, you will have steri-strips (small pieces of surgical tape) or Dermabond glue. The steri-strips/glue should begin to peel away within about a week (it is fine if the steri-strips fall off before then). If the strips are still in place one week after your surgery, you may gently remove them.  Diet            You may return to your usual diet. Be sure to stay hydrated.  When to Contact Us  Although your surgery and recovery will likely be uneventful, you may have some residual numbness, aches, and pains in your back and/or legs. This is normal and should improve in the next few weeks.  However, should you experience any of the following, contact us immediately: New numbness or weakness Pain that is progressively getting worse, and is not relieved by your pain medications or rest Bleeding, redness, swelling, pain, or drainage from surgical incision Chills or flu-like symptoms Fever greater than 101.0 F (38.3 C) Problems with bowel or bladder functions Difficulty breathing or shortness of breath Warmth, tenderness, or swelling in your calf  Contact Information How to contact us:  If you have any questions/concerns before or after surgery, you can reach us at [phone], or you can   send a mychart message. We can be reached by phone or mychart 8am-4pm, Monday-Friday.  *Please note: Calls after 4pm are forwarded to a third party answering service. Mychart messages are not routinely monitored during evenings, weekends, and holidays. Please call our office to contact the answering service for urgent concerns during non-business hours.  

## 2023-02-09 NOTE — Transfer of Care (Signed)
Immediate Anesthesia Transfer of Care Note  Patient: Stacey Snyder  Procedure(s) Performed: L2-4 POSTERIOR SPINAL DECOMPRESSION (Left: Spine Lumbar)  Patient Location: PACU  Anesthesia Type:General  Level of Consciousness: awake and alert   Airway & Oxygen Therapy: Patient Spontanous Breathing and Patient connected to face mask oxygen  Post-op Assessment: Report given to RN and Post -op Vital signs reviewed and stable  Post vital signs: Reviewed and stable  Last Vitals:  Vitals Value Taken Time  BP    Temp    Pulse 71 02/09/23 1551  Resp 14 02/09/23 1551  SpO2 100 % 02/09/23 1551  Vitals shown include unfiled device data.  Last Pain:  Vitals:   02/09/23 1308  TempSrc: Oral  PainSc: 8          Complications: No notable events documented.

## 2023-02-09 NOTE — Anesthesia Preprocedure Evaluation (Signed)
Anesthesia Evaluation  Patient identified by MRN, date of birth, ID band Patient awake    Reviewed: Allergy & Precautions, H&P , NPO status , Patient's Chart, lab work & pertinent test results, reviewed documented beta blocker date and time   History of Anesthesia Complications (+) PONV and history of anesthetic complications  Airway Mallampati: II  TM Distance: >3 FB Neck ROM: full    Dental  (+) Teeth Intact   Pulmonary neg pulmonary ROS   Pulmonary exam normal        Cardiovascular Exercise Tolerance: Poor hypertension, On Medications negative cardio ROS Normal cardiovascular exam Rhythm:regular Rate:Normal     Neuro/Psych  Headaches  negative psych ROS   GI/Hepatic Neg liver ROS,GERD  Medicated,,  Endo/Other  negative endocrine ROS    Renal/GU Renal disease  negative genitourinary   Musculoskeletal   Abdominal   Peds  Hematology  (+) Blood dyscrasia, anemia   Anesthesia Other Findings Past Medical History: No date: Anemia No date: Diverticulosis No date: Fibrocystic breast disease No date: GERD (gastroesophageal reflux disease) 12/04/2020: Glaucoma No date: History of kidney stones No date: Hypercholesterolemia No date: Hypertension No date: Hypertension No date: IBS (irritable bowel syndrome) No date: Inflammatory arthritis     Comment:  positive anti-CCP abs, s/p prednisone, MTX, Remicade No date: Meningioma (HCC) No date: Nephrolithiasis No date: Osteopenia     Comment:  GI upset with Fosamax 2007: Pancreatitis     Comment:  s/p ERCP No date: Pancreatitis No date: PONV (postoperative nausea and vomiting) No date: Renal insufficiency No date: Spinal stenosis of lumbosacral region Past Surgical History: 1993: ABDOMINAL HYSTERECTOMY     Comment:  ovaries not removed 1984: BREAST BIOPSY; Left     Comment:  negative 2010: BREAST CYST EXCISION; Left     Comment:  negative 12/05/2020:  COLONOSCOPY; N/A     Comment:  Procedure: COLONOSCOPY;  Surgeon: Regis Bill,               MD;  Location: [facility] ENDOSCOPY;  Service: Endoscopy;                Laterality: N/A; No date: EYE SURGERY     Comment:  cataract bilat No date: LITHOTRIPSY 1983: LUMBAR LAMINECTOMY 08/18/2016: LUMBAR LAMINECTOMY/DECOMPRESSION MICRODISCECTOMY; N/A     Comment:  Procedure: LUMBAR LAMINECTOMY/DECOMPRESSION               MICRODISCECTOMY 1 LEVEL;  Surgeon: Keith Rake, MD;                Location: [facility] ORS;  Service: Neurosurgery;  Laterality:               N/A;  L4-5 Laminectomy, MIS, L5 foraminotomy No date: TRIGGER FINGER RELEASE     Comment:  right ring finger BMI    Body Mass Index: 17.45 kg/m     Reproductive/Obstetrics negative OB ROS                             Anesthesia Physical Anesthesia Plan  ASA: 3  Anesthesia Plan: General ETT   Post-op Pain Management:    Induction:   PONV Risk Score and Plan:   Airway Management Planned:   Additional Equipment:   Intra-op Plan:   Post-operative Plan:   Informed Consent: I have reviewed the patients History and Physical, chart, labs and discussed the procedure including the risks, benefits and alternatives for the proposed anesthesia with the  patient or authorized representative who has indicated his/her understanding and acceptance.     Dental Advisory Given  Plan Discussed with: CRNA  Anesthesia Plan Comments:        Anesthesia Quick Evaluation

## 2023-02-09 NOTE — Op Note (Signed)
Indications: [name] is suffering from lumbar stenosis causing neurogenic claudication (ICD10 M48.062). The patient tried and failed conservative management, prompting surgical intervention.  Findings: lumbar stenosis  Preoperative Diagnosis: Lumbar Stenosis with neurogenic claudication Postoperative Diagnosis: same   EBL: 25 ml IVF:see anesthesia record Drains: none Disposition: Extubated and Stable to PACU Complications: none  No foley catheter was placed.   Preoperative Note:  Risks of surgery discussed include: infection, bleeding, stroke, coma, death, paralysis, CSF leak, nerve/spinal cord injury, numbness, tingling, weakness, complex regional pain syndrome, recurrent stenosis and/or disc herniation, vascular injury, development of instability, neck/back pain, need for further surgery, persistent symptoms, development of deformity, and the risks of anesthesia. The patient understood these risks and agreed to proceed.  Operative Note:   1. L2-4 lumbar decompression including central laminectomy and bilateral medial facetectomies including foraminotomies  The patient was then brought from the preoperative center with intravenous access established.  The patient underwent general anesthesia and endotracheal tube intubation, and was then rotated on the [place] rail top where all pressure points were appropriately padded.  The skin was then thoroughly cleansed.  Perioperative antibiotic prophylaxis was administered.  Sterile prep and drapes were then applied and a timeout was then observed.  C-arm was brought into the field under sterile conditions and under lateral visualization the L3-4 interspace was identified and marked.  The incision was marked on the left and injected with local anesthetic. Once this was complete a 4 cm incision was opened with the use of a #10 blade knife.    The metrx tubes were sequentially advanced and confirmed in position at L3-4. An 18mm by  40mm tube was locked in place to the bed side attachment.  The microscope was then sterilely brought into the field and muscle creep was hemostased with a bipolar and resected with a pituitary rongeur.  A Bovie extender was then used to expose the spinous process and lamina.  Careful attention was placed to not violate the facet capsule. A 3 mm matchstick drill bit was then used to make a hemi-laminotomy trough until the ligamentum flavum was exposed.  This was extended to the base of the spinous process and to the contralateral side to remove all the central bone from each side.  Once this was complete and the underlying ligamentum flavum was visualized, it was dissected with a curette and resected with Kerrison rongeurs.  Extensive ligamentum hypertrophy was noted, requiring a substantial amount of time and care for removal.  The dura was identified and palpated. The kerrison rongeur was then used to remove the medial facet bilaterally until no compression was noted.  A balltip probe was used to confirm decompression of the ipsilateral L4 nerve root.  Additional attention was paid to completion of the contralateral L3-4 foraminotomy until the contralateral traversing nerve root was completely free.  Once this was complete, L3-4 central decompression including medial facetectomy and foraminotomy was confirmed and decompression on both sides was confirmed. No CSF leak was noted.  Depo-Medrol was placed on the nerve root.  The wound was copiously irrigated. The tube system was then removed under microscopic visualization and hemostasis was obtained with a bipolar.    After performing the decompression at L3-4, the metrx tubes were sequentially advanced and confirmed in position at L2-3. An 18mm by 40mm tube was locked in place to the bed side attachment.  Fluoroscopy was then removed from the field.  The microscope was then sterilely brought into the field and muscle creep was  hemostased with a bipolar and  resected with a pituitary rongeur.  A Bovie extender was then used to expose the spinous process and lamina.  Careful attention was placed to not violate the facet capsule. A 3 mm matchstick drill bit was then used to make a hemi-laminotomy trough until the ligamentum flavum was exposed.  This was extended to the base of the spinous process and to the contralateral side to remove all the central bone from each side.  Once this was complete and the underlying ligamentum flavum was visualized, it was dissected with a curette and resected with Kerrison rongeurs.  Extensive ligamentum hypertrophy was noted, requiring a substantial amount of time and care for removal.  The dura was identified and palpated. The kerrison rongeur was then used to remove the medial facet bilaterally until no compression was noted.  A balltip probe was used to confirm decompression of the ipsilateral L3 nerve root.  Additional attention was paid to completion of the contralateral foraminotomy until the contralateral L3 nerve root was completely free.  Once this was complete, L2-3 central decompression including medial facetectomy and foraminotomy was confirmed and decompression on both sides was confirmed. No CSF leak was noted.  Depo-Medrol was placed on the nerve root.   The wound was copiously irrigated. The tube system was then removed under microscopic visualization and hemostasis was obtained with a bipolar.    A drain was placed.  The fascial layer was reapproximated with the use of a 0 Vicryl suture.  Subcutaneous tissue layer was reapproximated using 2-0 Vicryl suture.  3-0 monocryl was placed in subcuticular fashion. The skin was then cleansed and Dermabond was used to close the skin opening.  Patient was then rotated back to the preoperative bed awakened from anesthesia and taken to recovery all counts are correct in this case.  I performed the entire procedure with the assistance of Manning Charity PA as an Horticulturist, commercial. An assistant was required for this procedure due to the complexity.  The assistant provided assistance in tissue manipulation and suction, and was required for the successful and safe performance of the procedure. I performed the critical portions of the procedure.   Susann Lawhorne K. Myer Haff MD

## 2023-02-09 NOTE — Interval H&P Note (Signed)
Please note that the levels for surgery today are L2-4, as discussed with the patient prior to surgery in my clinic note.

## 2023-02-10 ENCOUNTER — Encounter: Payer: Self-pay | Admitting: Neurosurgery

## 2023-02-10 ENCOUNTER — Telehealth: Payer: Self-pay

## 2023-02-10 DIAGNOSIS — I10 Essential (primary) hypertension: Secondary | ICD-10-CM | POA: Diagnosis not present

## 2023-02-10 DIAGNOSIS — Z79899 Other long term (current) drug therapy: Secondary | ICD-10-CM | POA: Diagnosis not present

## 2023-02-10 DIAGNOSIS — M48062 Spinal stenosis, lumbar region with neurogenic claudication: Secondary | ICD-10-CM | POA: Diagnosis not present

## 2023-02-10 MED ORDER — PANTOPRAZOLE SODIUM 40 MG PO TBEC
DELAYED_RELEASE_TABLET | ORAL | Status: AC
  Filled 2023-02-10: qty 1

## 2023-02-10 MED ORDER — TRAMADOL HCL 50 MG PO TABS
ORAL_TABLET | ORAL | Status: AC
  Filled 2023-02-10: qty 1

## 2023-02-10 MED ORDER — METHOCARBAMOL 500 MG PO TABS: 500.0000 mg | ORAL_TABLET | Freq: Three times a day (TID) | ORAL | 0 refills | Status: DC | PRN

## 2023-02-10 MED ORDER — DOCUSATE SODIUM 100 MG PO CAPS
ORAL_CAPSULE | ORAL | Status: AC
  Filled 2023-02-10: qty 1

## 2023-02-10 MED ORDER — SENNA 8.6 MG PO TABS
ORAL_TABLET | ORAL | Status: AC
  Filled 2023-02-10: qty 1

## 2023-02-10 MED ORDER — TRAMADOL HCL 50 MG PO TABS: 50.0000 mg | ORAL_TABLET | Freq: Four times a day (QID) | ORAL | 0 refills | Status: DC

## 2023-02-10 MED ORDER — SENNA 8.6 MG PO TABS: 1.0000 | ORAL_TABLET | Freq: Every day | ORAL | 0 refills | Status: DC | PRN

## 2023-02-10 MED ORDER — ACETAMINOPHEN 325 MG PO TABS
ORAL_TABLET | ORAL | Status: AC
  Filled 2023-02-10: qty 2

## 2023-02-10 MED ORDER — ENOXAPARIN SODIUM 40 MG/0.4ML IJ SOSY
PREFILLED_SYRINGE | INTRAMUSCULAR | Status: AC
  Filled 2023-02-10: qty 0.4

## 2023-02-10 MED ORDER — ONDANSETRON HCL 4 MG/2ML IJ SOLN
INTRAMUSCULAR | Status: AC
  Filled 2023-02-10: qty 2

## 2023-02-10 NOTE — Telephone Encounter (Addendum)
Per Covermymeds: "Approved. This drug has been approved under the Member's Medicare Part D benefit. Approved quantity: 90 units per 30 day(s). You may fill up to a 90 day supply except for those on Specialty Tier 5, which can be filled up to a 30 day supply. Please call the pharmacy to process the prescription claim. Authorization Expiration Date: 05/04/2098"  A copy of the approval letter has been scanned into the pt's chart and has been routed to [facility].

## 2023-02-10 NOTE — Anesthesia Postprocedure Evaluation (Signed)
Anesthesia Post Note  Patient: Stacey Snyder  Procedure(s) Performed: L2-4 POSTERIOR SPINAL DECOMPRESSION (Left: Spine Lumbar)  Patient location during evaluation: PACU Anesthesia Type: General Level of consciousness: awake and alert Pain management: pain level controlled Vital Signs Assessment: post-procedure vital signs reviewed and stable Respiratory status: spontaneous breathing, nonlabored ventilation, respiratory function stable and patient connected to nasal cannula oxygen Cardiovascular status: blood pressure returned to baseline and stable Postop Assessment: no apparent nausea or vomiting Anesthetic complications: no   No notable events documented.   Last Vitals:  Vitals:   02/10/23 0238 02/10/23 0609  BP: (!) 163/61 (!) 161/66  Pulse: 62 66  Resp: 16 16  Temp: 36.8 C 37.1 C  SpO2: 97% 97%    Last Pain:  Vitals:   02/10/23 0609  TempSrc: Temporal  PainSc:                  Lenard Simmer

## 2023-02-10 NOTE — Evaluation (Signed)
Physical Therapy Evaluation Patient Details Name: Stacey Snyder MRN: [id number] DOB: 1932-09-08 Today's Date: 02/10/2023  History of Present Illness  Pt is a 88 y.o. female s/p L posterior lumbar decompression on 02/09/23; POD 1  Clinical Impression  Pt is received in bed, she is agreeable to PT session. At baseline, Pt reports performing ADLs independently and IADLs with assist from family, amb using 3-wheel walker and occasional SPC, and spending time with friends every Tuesday. Pt endorses 2/10 NPS back pain at beginning of session and was monitored throughout session with pain not increasing. Pt performs bed mobility sup A, transfers, amb, and stair negotiation CGA for safety. Pt able to amb approx 200 ft using RW with occasional cues to maintain trunk closer to AD and slight R knee buckling which Pt states is her baseline (see below for more details). Additionally, Pt able to negotiate 4 steps using BUE support on L railing with no cues required. Educated Pt on back precautions to promote adherence-Pt verbalized understanding. Pt would benefit from skilled PT to address above deficits and promote optimal return to PLOF.      If plan is discharge home, recommend the following:     Can travel by private vehicle        Equipment Recommendations None recommended by PT  Recommendations for Other Services       Functional Status Assessment Patient has had a recent decline in their functional status and demonstrates the ability to make significant improvements in function in a reasonable and predictable amount of time.     Precautions / Restrictions Precautions Precautions: Fall;Back Precaution Booklet Issued: Yes (comment) Restrictions Weight Bearing Restrictions: No      Mobility  Bed Mobility Overal bed mobility: Needs Assistance Bed Mobility: Rolling, Sidelying to Sit, Sit to Sidelying Rolling: Supervision, Used rails Sidelying to sit: Supervision, Used rails      Sit to sidelying: Supervision, Used rails General bed mobility comments: able to perform bed mobility sup A with frequent cuing for hand placement and adherence to precautions    Transfers Overall transfer level: Needs assistance Equipment used: Rolling walker (2 wheels) Transfers: Sit to/from Stand Sit to Stand: Contact guard assist           General transfer comment: able to perform STS using RW; requiring slight VC for hand placement to facilitate task    Ambulation/Gait Ambulation/Gait assistance: Contact guard assist Gait Distance (Feet): 200 Feet Assistive device: Rolling walker (2 wheels) Gait Pattern/deviations: Step-through pattern, Decreased step length - right, Decreased step length - left, Decreased stride length, Antalgic Gait velocity: slightly decreased     General Gait Details: Able to amb approx 200 ft using RW; cuing to maintain AD closer to trunk; slight antalgic gait with occasional R knee giving out which Pt states is her baseline  Stairs Stairs: Yes Stairs assistance: Contact guard assist Stair Management: One rail Left, Step to pattern Number of Stairs: 4 General stair comments: able to negotiate 4 steps using BUE support on L railing with no cuing required  Wheelchair Mobility     Tilt Bed    Modified Rankin (Stroke Patients Only)       Balance Overall balance assessment: Independent                                           Pertinent Vitals/Pain Pain Assessment Pain Assessment: 0-10 Pain  Score: 2  Pain Location: back Pain Descriptors / Indicators: Sore, Operative site guarding Pain Intervention(s): Limited activity within patient's tolerance, Monitored during session, Premedicated before session, Repositioned    Home Living Family/patient expects to be discharged to:: Private residence Living Arrangements: Other relatives;Alone Available Help at Discharge: Family;Available 24 hours/day;Available  PRN/intermittently Type of Home: House Home Access: Stairs to enter Entrance Stairs-Rails: Left Entrance Stairs-Number of Steps: 4 Alternate Level Stairs-Number of Steps: standard flight Home Layout: Able to live on main level with bedroom/bathroom;Two level Home Equipment: Cane - single point;Rollator (4 wheels);Grab bars - tub/shower;BSC/3in1;Shower seat      Prior Function Prior Level of Function : Independent/Modified Independent             Mobility Comments: Pt reports using 3-wheel walker for household amb and SPC occasionally when walking to her porch ADLs Comments: Able to perform ADLs independently and requires assist with IADLs from daughters and grandkids     Extremity/Trunk Assessment   Upper Extremity Assessment Upper Extremity Assessment: Overall WFL for tasks assessed    Lower Extremity Assessment Lower Extremity Assessment: Generalized weakness       Communication   Communication Communication: No apparent difficulties Cueing Techniques: Verbal cues  Cognition Arousal: Alert Behavior During Therapy: WFL for tasks assessed/performed Overall Cognitive Status: Within Functional Limits for tasks assessed                                 General Comments: AO x4; Pleasant and cooperative for PT session; slightly impulsive requiring cues to slow down        General Comments General comments (skin integrity, edema, etc.): Able to maintain static and dynamic seated and standing balance with no LOB noted    Exercises Other Exercises Other Exercises: Educated and performed back precautions protocol for mobility to adhere to restrictions Other Exercises: toileting; close sup for peri-care to adhere to back precautions   Assessment/Plan    PT Assessment Patient needs continued PT services  PT Problem List Decreased strength;Decreased mobility;Pain;Decreased range of motion       PT Treatment Interventions DME instruction;Gait training;Stair  training;Functional mobility training;Therapeutic activities;Therapeutic exercise;Neuromuscular re-education    PT Goals (Current goals can be found in the Care Plan section)  Acute Rehab PT Goals Patient Stated Goal: to go home PT Goal Formulation: With patient Time For Goal Achievement: 02/24/23 Potential to Achieve Goals: Good    Frequency Min 1X/week     Co-evaluation               AM-PAC PT "6 Clicks" Mobility  Outcome Measure Help needed turning from your back to your side while in a flat bed without using bedrails?: None Help needed moving from lying on your back to sitting on the side of a flat bed without using bedrails?: None Help needed moving to and from a bed to a chair (including a wheelchair)?: None Help needed standing up from a chair using your arms (e.g., wheelchair or bedside chair)?: None Help needed to walk in hospital room?: A Little Help needed climbing 3-5 steps with a railing? : A Little 6 Click Score: 22    End of Session   Activity Tolerance: Patient tolerated treatment well;No increased pain Patient left: in bed;with call bell/phone within reach;with bed alarm set;with SCD's reapplied Nurse Communication: Mobility status PT Visit Diagnosis: Unsteadiness on feet (R26.81);Muscle weakness (generalized) (M62.81);Pain Pain - Right/Left: Left Pain - part of  body:  (back)    Time: 0865-7846 PT Time Calculation (min) (ACUTE ONLY): 23 min   Charges:                 Elmon Else, SPT   Philopater Mucha 02/10/2023, 10:01 AM

## 2023-02-10 NOTE — Evaluation (Signed)
Occupational Therapy Evaluation Patient Details Name: Stacey Snyder MRN: [id number] DOB: 08-25-1932 Today's Date: 02/10/2023   History of Present Illness Pt is a 89 y.o. female s/p L posterior lumbar decompression on 02/09/23; POD 1   Clinical Impression   Pt was seen for OT evaluation this date. Prior to hospital admission, pt performs ADL's independently and requires assist with IADL's from daughters and grand kids. Pt lives with family but they work outside the home. Pt reports that her children would alternate in providing assistance, if needed. Pt presents to acute OT demonstrating impaired ADL performance and functional mobility 2/2 (See OT problem list for additional functional deficits). Upon entering room, pt supine in bed, agreeable to tx. Pt completed lower body dressing sitting at the EOB and completed sit <> stand t/f with supervision. Pt ambulated to the bathroom with a RW  and completed toilet t/f, toilet management, and hygiene with supervision. Pt required Min cuing during the session. Pt ambulated to the hallway and back to the room, about 100 ft and completed sit> supine t/f  with supervision. Pt left supine in bed with call bell within reach and bed alarm.  Do not anticipate the need for follow up OT services upon acute hospital DC. OT to complete order, signing off.       If plan is discharge home, recommend the following: Assistance with cooking/housework;Assist for transportation;Help with stairs or ramp for entrance;A little help with walking and/or transfers;A little help with bathing/dressing/bathroom    Functional Status Assessment  Patient has had a recent decline in their functional status and demonstrates the ability to make significant improvements in function in a reasonable and predictable amount of time.  Equipment Recommendations  None recommended by OT    Recommendations for Other Services       Precautions / Restrictions Precautions Precautions:  Fall;Back Precaution Booklet Issued: Yes (comment) Restrictions Weight Bearing Restrictions: No      Mobility Bed Mobility Overal bed mobility: Needs Assistance Bed Mobility: Rolling, Sidelying to Sit, Sit to Sidelying Rolling: Supervision, Used rails Sidelying to sit: Supervision, Used rails     Sit to sidelying: Supervision, Used rails      Transfers Overall transfer level: Needs assistance Equipment used: Rolling walker (2 wheels) Transfers: Sit to/from Stand Sit to Stand: Contact guard assist                  Balance Overall balance assessment: Independent                                         ADL either performed or assessed with clinical judgement   ADL Overall ADL's : Needs assistance/impaired     Grooming: Wash/dry hands;Standing;Supervision/safety               Lower Body Dressing: Supervision/safety;Sitting/lateral leans;Cueing for back precautions   Toilet Transfer: Supervision/safety;Ambulation;Rolling walker (2 wheels);Grab bars;Comfort height toilet   Toileting- Clothing Manipulation and Hygiene: Supervision/safety;Cueing for safety;Adhering to back precautions;Sit to/from stand       Functional mobility during ADLs: Supervision/safety;Rolling walker (2 wheels)       Vision Baseline Vision/History: 1 Wears glasses       Perception         Praxis         Pertinent Vitals/Pain Pain Assessment Pain Assessment: No/denies pain     Extremity/Trunk Assessment Upper Extremity Assessment Upper Extremity Assessment:  Overall WFL for tasks assessed   Lower Extremity Assessment Lower Extremity Assessment: Generalized weakness       Communication Communication Communication: No apparent difficulties Cueing Techniques: Verbal cues   Cognition Arousal: Alert Behavior During Therapy: WFL for tasks assessed/performed                                   General Comments: AO x4; Pleasant and  cooperative for OT session; slightly impulsive requiring cues to slow down     General Comments  Able to maintain static and dynamic seated and standing balance with no LOB noted    Exercises     Shoulder Instructions      Home Living Family/patient expects to be discharged to:: Private residence Living Arrangements: Other relatives;Alone Available Help at Discharge: Family;Available 24 hours/day;Available PRN/intermittently Type of Home: House Home Access: Stairs to enter Entrance Stairs-Number of Steps: 4 Entrance Stairs-Rails: Left Home Layout: Able to live on main level with bedroom/bathroom;Two level Alternate Level Stairs-Number of Steps: standard flight Alternate Level Stairs-Rails: Can reach both;Left;Right Bathroom Shower/Tub: Producer, television/film/video: Standard     Home Equipment: Cane - single point;Rollator (4 wheels);Grab bars - tub/shower;BSC/3in1;Shower seat          Prior Functioning/Environment Prior Level of Function : Independent/Modified Independent             Mobility Comments: Pt reports using 3-wheel walker for household amb and SPC occasionally when walking to her porch ADLs Comments: Able to perform ADLs independently and requires assist with IADLs from daughters and grandkids        OT Problem List: Decreased strength;Decreased range of motion;Decreased activity tolerance;Decreased knowledge of precautions;Decreased safety awareness      OT Treatment/Interventions:      OT Goals(Current goals can be found in the care plan section) Acute Rehab OT Goals Patient Stated Goal: to go home. OT Goal Formulation: With patient Time For Goal Achievement: 02/10/23 Potential to Achieve Goals: Good  OT Frequency:      Co-evaluation              AM-PAC OT "6 Clicks" Daily Activity     Outcome Measure Help from another person eating meals?: None Help from another person taking care of personal grooming?: None Help from another  person toileting, which includes using toliet, bedpan, or urinal?: A Little Help from another person bathing (including washing, rinsing, drying)?: A Little Help from another person to put on and taking off regular upper body clothing?: None Help from another person to put on and taking off regular lower body clothing?: A Little 6 Click Score: 21   End of Session Equipment Utilized During Treatment: Rolling walker (2 wheels) Nurse Communication: Mobility status  Activity Tolerance: Patient tolerated treatment well Patient left: in bed;with call bell/phone within reach;with bed alarm set  OT Visit Diagnosis: Other abnormalities of gait and mobility (R26.89);Unsteadiness on feet (R26.81);Muscle weakness (generalized) (M62.81);History of falling (Z91.81)                Time: 6578-4696 OT Time Calculation (min): 18 min Charges:  OT General Charges $OT Visit: 1 Visit OT Evaluation $OT Eval Low Complexity: 1 Low OT Treatments $Self Care/Home Management : 8-22 mins  Butch Penny, SOT

## 2023-02-10 NOTE — Telephone Encounter (Signed)
Authorization has been submitted through [employer].   PA Case ID #: [id number]

## 2023-02-10 NOTE — Progress Notes (Signed)
Neurosurgery Progress Note  History: Stacey Snyder is s/p L2-4 decompression   POD1: Pt reports improved leg pain this morning. Her back pain is well controlled on current medication regimen   Physical Exam: Vitals:   02/10/23 0238 02/10/23 0609  BP: (!) 163/61 (!) 161/66  Pulse: 62 66  Resp: 16 16  Temp: 98.2 F (36.8 C) 98.8 F (37.1 C)  SpO2: 97% 97%    AA Ox3 CNI  Strength:5/5 throughout BLE HV 90 mL out since surgery  Data:  Other tests/results: none  Assessment/Plan:  Stacey Snyder is a 89 y.o s/p lumbar decompression with improvement post-op.  - mobilize - pain control - DVT prophylaxis - will continue to monitor drain output and consider removal this afternoon - PTOT; dispo planning underway  Manning Charity PA-C Department of Neurosurgery

## 2023-02-10 NOTE — Discharge Summary (Signed)
Discharge Summary  Patient ID: Stacey Snyder MRN: [id number] DOB/AGE: 87-Jun-1934 87 y.o.  Admit date: 02/09/2023 Discharge date: 02/10/2023  Admission Diagnoses: Lumbar Stenosis with neurogenic claudication  Discharge Diagnoses:  Principal Problem:   Lumbar stenosis Active Problems:   Lumbar stenosis with neurogenic claudication   Discharged Condition: good  Hospital Course:  Stacey Snyder is a very pleasant 89 year old presenting with lumbar stenosis with neurogenic claudication status post L2-4 lumbar decompression.  Her intraoperative course was uncomplicated.  She was admitted overnight for pain control, therapy evaluation, and drain output monitoring.  Her drain output reduced to an acceptable level and was removed on the afternoon of postop day 1.  She is seen and evaluated by physical therapy and deemed appropriate for discharge home on postop day 1 with home health.  Her pain was well-controlled without adverse side effects on tramadol.  She was discharged home with tramadol, Robaxin, and senna.  Consults: None  Significant Diagnostic Studies: none  Treatments: surgery: As above.  Please see separately dictated operative report for further details.  Discharge Exam: Blood pressure (!) 161/60, pulse 68, temperature 97.8 F (36.6 C), temperature source Oral, resp. rate 16, height 5\' 1"  (1.549 m), weight 41.9 kg, SpO2 99%. A&O x 3 Incision intact with postoperative dressing in place. 5/5 throughout bilateral lower extremities.  Disposition: Discharge disposition: 06-Home-Health Care Svc       Discharge Instructions     Incentive spirometry RT   Complete by: As directed       Allergies as of 02/10/2023       Reactions   Carisoprodol Hives   Whelps all over body and itchy eyes Whelps all over body and itchy eyes   Metronidazole Swelling, Other (See Comments), Rash   Caused tongue irritation and swelling Caused tongue irritation and swelling    Codeine Nausea Only   Nsaids    Patient to avoid based on kidney function   Skelaxin [metaxalone] Swelling   Tenormin [atenolol] Other (See Comments)   Questionable swelling   Tramadol Nausea And Vomiting   Pt states 07/11/16 she is okay as long as she eats when taking it        Medication List     TAKE these medications    acetaminophen 500 MG tablet Commonly known as: TYLENOL Take 1,000 mg by mouth in the morning and at bedtime.   Align 4 MG Caps Take one capsule daily   diltiazem 240 MG 24 hr capsule Commonly known as: Dilt-XR Take 1 capsule (240 mg total) by mouth daily.   dorzolamide-timolol 2-0.5 % ophthalmic solution Commonly known as: COSOPT INSTILL 1 DROP INTO EACH EYE TWICE DAILY   fluticasone 50 MCG/ACT nasal spray Commonly known as: FLONASE USE TWO SPRAY(S) IN EACH NOSTRIL ONCE DAILY   folic acid 1 MG tablet Commonly known as: FOLVITE Take 1 mg by mouth daily.   gabapentin 300 MG capsule Commonly known as: NEURONTIN Take 1 capsule (300 mg total) by mouth at bedtime.   latanoprost 0.005 % ophthalmic solution Commonly known as: XALATAN Place 1 drop into both eyes at bedtime.   lovastatin 40 MG tablet Commonly known as: MEVACOR Take 1 tablet (40 mg total) by mouth daily.   MAGNESIUM OXIDE 400 PO Take 1 tablet by mouth daily.   methocarbamol 500 MG tablet Commonly known as: ROBAXIN Take 1 tablet (500 mg total) by mouth every 8 (eight) hours as needed for muscle spasms.   methotrexate 25 MG/ML (PF) Soln Inject 17.5 mg into  the skin once a week. Patient takes 17.5 mg (0.7 ml) on Monday or Tuesday of each week.   omeprazole 20 MG capsule Commonly known as: PRILOSEC Take 1 capsule (20 mg total) by mouth daily.   prednisoLONE acetate 1 % ophthalmic suspension Commonly known as: PRED FORTE Place 1 drop into both eyes daily. am   PreserVision AREDS Tabs Take 1 tablet by mouth in the morning and at bedtime.   psyllium 58.6 % packet Commonly  known as: METAMUCIL Take 1 packet by mouth daily.   REMICADE IV Inject into the vein every 6 (six) weeks. Per Dr Gavin Potters   senna 8.6 MG Tabs tablet Commonly known as: SENOKOT Take 1 tablet (8.6 mg total) by mouth daily as needed for mild constipation.   traMADol 50 MG tablet Commonly known as: ULTRAM Take 1 tablet (50 mg total) by mouth every 6 (six) hours.   valsartan 160 MG tablet Commonly known as: DIOVAN TAKE 1 TABLET(160 MG) BY MOUTH DAILY   Vitamin D 50 MCG (2000 UT) tablet Take 2,000 Units by mouth daily.        Follow-up Information     Drake Leach, PA-C Follow up on 02/24/2023.   Specialty: Neurosurgery Contact information: [address] [place] [address] [phone]                 Signed: Susanne Borders 02/10/2023, 12:43 PM

## 2023-02-10 NOTE — TOC Progression Note (Addendum)
Transition of Care [facility]) - Progression Note    Patient Details  Name: [name] MRN: [id number] Date of Birth: 1933-02-20  [facility] CM/SW Contact  Marlowe Sax, RN Phone Number: 02/10/2023, 3:40 PM  Clinical Narrative:     Met with the patient in the room, they have DME at home and do not need more, they want to use [facility] for [facility] services I notified [facility], they are not able to staff, I sent the referral to [facility] for [facility], [facility] accepted   Expected Discharge Plan: Home w Home Health Services Barriers to Discharge: No Barriers Identified  Expected Discharge Plan and Services   Discharge Planning Services: CM Consult   Living arrangements for the past 2 months: Single Family Home Expected Discharge Date: 02/10/23                 DME Agency: NA       HH Arranged: PT, OT HH Agency: [facility] Date HH Agency Contacted: 02/10/23 Time HH Agency Contacted: 1538 Representative spoke with at [facility] Agency: [place]   Social Determinants of Health (SDOH) Interventions SDOH Screenings   Food Insecurity: No Food Insecurity (02/09/2023)  Housing: Low Risk  (02/09/2023)  Transportation Needs: No Transportation Needs (02/09/2023)  Utilities: Not At Risk (02/09/2023)  Depression (PHQ2-9): Low Risk  (05/26/2022)  Tobacco Use: Low Risk  (02/09/2023)    Readmission Risk Interventions     No data to display

## 2023-02-10 NOTE — Plan of Care (Signed)
  Problem: Pain Management: Goal: Pain level will decrease Outcome: Progressing   Problem: Skin Integrity: Goal: Will show signs of wound healing Outcome: Progressing   

## 2023-02-10 NOTE — Progress Notes (Signed)
DISCHARGE NOTE:  Pt and daughter given discharge instructions and verbalized understanding. Pt wheeled to car by staff, daughter providing transportation.

## 2023-02-10 NOTE — Plan of Care (Signed)
  Problem: Education: Goal: Ability to verbalize activity precautions or restrictions will improve Outcome: Progressing   Problem: Activity: Goal: Ability to avoid complications of mobility impairment will improve Outcome: Progressing Goal: Ability to tolerate increased activity will improve Outcome: Progressing Goal: Will remain free from falls Outcome: Progressing   Problem: Pain Management: Goal: Pain level will decrease Outcome: Progressing

## 2023-02-12 ENCOUNTER — Telehealth: Payer: Self-pay | Admitting: Internal Medicine

## 2023-02-12 DIAGNOSIS — H409 Unspecified glaucoma: Secondary | ICD-10-CM | POA: Diagnosis not present

## 2023-02-12 DIAGNOSIS — M48062 Spinal stenosis, lumbar region with neurogenic claudication: Secondary | ICD-10-CM | POA: Diagnosis not present

## 2023-02-12 DIAGNOSIS — Z79891 Long term (current) use of opiate analgesic: Secondary | ICD-10-CM | POA: Diagnosis not present

## 2023-02-12 DIAGNOSIS — Z452 Encounter for adjustment and management of vascular access device: Secondary | ICD-10-CM | POA: Diagnosis not present

## 2023-02-12 DIAGNOSIS — Z9181 History of falling: Secondary | ICD-10-CM | POA: Diagnosis not present

## 2023-02-12 DIAGNOSIS — M4808 Spinal stenosis, sacral and sacrococcygeal region: Secondary | ICD-10-CM | POA: Diagnosis not present

## 2023-02-12 DIAGNOSIS — K219 Gastro-esophageal reflux disease without esophagitis: Secondary | ICD-10-CM | POA: Diagnosis not present

## 2023-02-12 DIAGNOSIS — Z79899 Other long term (current) drug therapy: Secondary | ICD-10-CM | POA: Diagnosis not present

## 2023-02-12 DIAGNOSIS — M069 Rheumatoid arthritis, unspecified: Secondary | ICD-10-CM | POA: Diagnosis not present

## 2023-02-12 DIAGNOSIS — Z4789 Encounter for other orthopedic aftercare: Secondary | ICD-10-CM | POA: Diagnosis not present

## 2023-02-12 DIAGNOSIS — I10 Essential (primary) hypertension: Secondary | ICD-10-CM | POA: Diagnosis not present

## 2023-02-12 DIAGNOSIS — E7800 Pure hypercholesterolemia, unspecified: Secondary | ICD-10-CM | POA: Diagnosis not present

## 2023-02-12 NOTE — Telephone Encounter (Signed)
Thank her for calling and tell her to let us know if she needs anything.

## 2023-02-12 NOTE — Telephone Encounter (Signed)
Patient just called and said she had her surgery on Monday. And she wanted to let Dr. Lorin Picket know she is doing a lot better and she is walking. And she said she feels good. Her number is [phone].

## 2023-02-12 NOTE — Telephone Encounter (Signed)
Patient aware.

## 2023-02-12 NOTE — Telephone Encounter (Signed)
FYI for you. 

## 2023-02-14 ENCOUNTER — Emergency Department
Admission: EM | Admit: 2023-02-14 | Discharge: 2023-02-14 | Disposition: A | Discharge status: Home / Self Care | Payer: Medicare Other | Attending: Emergency Medicine | Admitting: Emergency Medicine

## 2023-02-14 ENCOUNTER — Other Ambulatory Visit: Payer: Self-pay

## 2023-02-14 ENCOUNTER — Emergency Department: Payer: Medicare Other

## 2023-02-14 DIAGNOSIS — R109 Unspecified abdominal pain: Secondary | ICD-10-CM | POA: Diagnosis present

## 2023-02-14 DIAGNOSIS — K5641 Fecal impaction: Secondary | ICD-10-CM

## 2023-02-14 DIAGNOSIS — K5900 Constipation, unspecified: Secondary | ICD-10-CM | POA: Insufficient documentation

## 2023-02-14 DIAGNOSIS — R1084 Generalized abdominal pain: Secondary | ICD-10-CM | POA: Diagnosis not present

## 2023-02-14 DIAGNOSIS — I10 Essential (primary) hypertension: Secondary | ICD-10-CM | POA: Insufficient documentation

## 2023-02-14 DIAGNOSIS — E876 Hypokalemia: Secondary | ICD-10-CM | POA: Insufficient documentation

## 2023-02-14 DIAGNOSIS — Z90710 Acquired absence of both cervix and uterus: Secondary | ICD-10-CM | POA: Diagnosis not present

## 2023-02-14 LAB — COMPREHENSIVE METABOLIC PANEL
ALT: 28 U/L (ref 0–44)
AST: 35 U/L (ref 15–41)
Albumin: 3.6 g/dL (ref 3.5–5.0)
Alkaline Phosphatase: 94 U/L (ref 38–126)
Anion gap: 11 (ref 5–15)
BUN: 17 mg/dL (ref 8–23)
CO2: 31 mmol/L (ref 22–32)
Calcium: 9.1 mg/dL (ref 8.9–10.3)
Chloride: 88 mmol/L — ABNORMAL LOW (ref 98–111)
Creatinine, Ser: 0.85 mg/dL (ref 0.44–1.00)
GFR, Estimated: >60 mL/min (ref >60)
Glucose, Bld: 160 mg/dL — ABNORMAL HIGH (ref 70–99)
Potassium: 2.5 mmol/L — CL (ref 3.5–5.1)
Sodium: 130 mmol/L — ABNORMAL LOW (ref 135–145)
Total Bilirubin: 0.8 mg/dL (ref 0.3–1.2)
Total Protein: 6.9 g/dL (ref 6.5–8.1)

## 2023-02-14 LAB — POTASSIUM: Potassium: 3 mmol/L — ABNORMAL LOW (ref 3.5–5.1)

## 2023-02-14 LAB — CBC
HCT: 35.1 % — ABNORMAL LOW (ref 36.0–46.0)
Hemoglobin: 11.7 g/dL — ABNORMAL LOW (ref 12.0–15.0)
MCH: 32.1 pg (ref 26.0–34.0)
MCHC: 33.3 g/dL (ref 30.0–36.0)
MCV: 96.4 fL (ref 80.0–100.0)
Platelets: 398 10*3/uL (ref 150–400)
RBC: 3.64 MIL/uL — ABNORMAL LOW (ref 3.87–5.11)
RDW: 15.5 % (ref 11.5–15.5)
WBC: 11.6 10*3/uL — ABNORMAL HIGH (ref 4.0–10.5)
nRBC: 0 % (ref 0.0–0.2)

## 2023-02-14 LAB — MAGNESIUM
Magnesium: 5.2 mg/dL — ABNORMAL HIGH (ref 1.7–2.4)
Magnesium: 4.2 mg/dL — ABNORMAL HIGH (ref 1.7–2.4)

## 2023-02-14 LAB — LIPASE, BLOOD: Lipase: 30 U/L (ref 11–51)

## 2023-02-14 MED ORDER — IOHEXOL 300 MG/ML  SOLN
100.0000 mL | Freq: Once | INTRAMUSCULAR | Status: AC | PRN
  Administered 2023-02-14: 100 mL via INTRAVENOUS

## 2023-02-14 MED ORDER — POTASSIUM CHLORIDE 10 MEQ/100ML IV SOLN
10.0000 meq | Freq: Once | INTRAVENOUS | Status: AC
  Administered 2023-02-14: 10 meq via INTRAVENOUS
  Filled 2023-02-14: qty 100

## 2023-02-14 MED ORDER — POTASSIUM CHLORIDE CRYS ER 20 MEQ PO TBCR: 20.0000 meq | EXTENDED_RELEASE_TABLET | Freq: Every day | ORAL | 0 refills | Status: DC

## 2023-02-14 MED ORDER — POTASSIUM CHLORIDE CRYS ER 20 MEQ PO TBCR
40.0000 meq | EXTENDED_RELEASE_TABLET | Freq: Once | ORAL | Status: AC
  Administered 2023-02-14: 40 meq via ORAL
  Filled 2023-02-14: qty 2

## 2023-02-14 MED ORDER — SORBITOL 70 % SOLN
960.0000 mL | TOPICAL_OIL | Freq: Once | ORAL | Status: AC
  Administered 2023-02-14: 960 mL via RECTAL
  Filled 2023-02-14: qty 240

## 2023-02-14 MED ORDER — DOCUSATE SODIUM 100 MG PO CAPS: 100.0000 mg | ORAL_CAPSULE | Freq: Two times a day (BID) | ORAL | 0 refills | Status: AC

## 2023-02-14 NOTE — ED Provider Notes (Signed)
-----------------------------------------   3:29 PM on 02/14/2023 -----------------------------------------  Blood pressure (!) 154/64, pulse 80, temperature 98.1 F (36.7 C), temperature source Oral, resp. rate 14, height 5\' 1"  (1.549 m), weight 41.9 kg, SpO2 98%.  Assuming care from Dr. Anner Crete.  In short, Stacey Snyder is a 89 y.o. female with a chief complaint of Abdominal Pain .  Refer to the original H&P for additional details.  The current plan of care is to reassess following enema and repletion of potassium.  ----------------------------------------- 4:59 PM on 02/14/2023 ----------------------------------------- Patient had difficulty tolerating full enema due to cramping, did pass some stool after tolerating about half of the enema.  Magnesium level noted to be elevated, patient states she has been taking extra magnesium tablets at home because this has helped with her cramping.  She was offered admission to the hospital for management of constipation and electrolyte derangement, but declines.  Magnesium and potassium levels are improved on recheck and she is appropriate for outpatient management.  She was counseled to follow-up with PCP for recheck of electrolytes and to return to the ED for new or worsening symptoms.  Patient agrees with plan.    Chesley Noon, MD 02/14/23 1700

## 2023-02-14 NOTE — ED Notes (Signed)
Pt only able to tolerate about half of enema. Took multiple breaks on BSC after experiencing cramping from enema at slow rate. Pt able to have medium amount of small separate lump BM

## 2023-02-14 NOTE — ED Notes (Signed)
Discharge instructions reviewed with patient. Patient questions answered and opportunity for education reviewed. Patient voices understanding of discharge instructions with no further questions. Patient to lobby via wheelchair.

## 2023-02-14 NOTE — ED Provider Notes (Signed)
[facility] Provider Note    Event Date/Time   First MD Initiated Contact with Patient 02/14/23 1001     (approximate)   History   Abdominal Pain   HPI Stacey Snyder is a 89 y.o. female with history of HTN, HLD, GERD, pancreatitis, nephrolithiasis presenting today for abdominal pain.  Patient states she has had worsening lower abdominal pain over the past 48 hours.  She states she has not had a bowel movement in 5 days.  She has tried milk of magnesia, Dulcolax, and enema at home without relief.  Last night she has started having nausea and chills as well.  Denies vomiting, chest pain, shortness of breath.  Does note prior history of constipation issues.  Per chart review: Patient was recently in the hospital for 1 day for L2-4 lumbar decompression with laminectomy with neurosurgery and discharged on 02/10/2023.     Physical Exam   Triage Vital Signs: ED Triage Vitals  Encounter Vitals Group     BP 02/14/23 0954 139/69     Systolic BP Percentile --      Diastolic BP Percentile --      Pulse Rate 02/14/23 0954 97     Resp 02/14/23 0954 17     Temp 02/14/23 0954 98.1 F (36.7 C)     Temp Source 02/14/23 0954 Oral     SpO2 02/14/23 0954 98 %     Weight 02/14/23 0945 92 lb 6 oz (41.9 kg)     Height 02/14/23 0945 5\' 1"  (1.549 m)     Head Circumference --      Peak Flow --      Pain Score 02/14/23 0944 7     Pain Loc --      Pain Education --      Exclude from Growth Chart --     Most recent vital signs: Vitals:   02/14/23 1708 02/14/23 1720  BP:  (!) 145/66  Pulse:  82  Resp: 19 18  Temp:  98.5 F (36.9 C)  SpO2:  97%   Physical Exam: I have reviewed the vital signs and nursing notes. General: Awake, alert, no acute distress.  Nontoxic appearing. Head:  Atraumatic, normocephalic.   ENT:  EOM intact, PERRL. Oral mucosa is pink and moist with no lesions. Neck: Neck is supple with full range of motion, No meningeal  signs. Cardiovascular:  RRR, No murmurs. Peripheral pulses palpable and equal bilaterally. Respiratory:  Symmetrical chest wall expansion.  No rhonchi, rales, or wheezes.  Good air movement throughout.  No use of accessory muscles.   Musculoskeletal:  No cyanosis or edema. Moving extremities with full ROM Abdomen:  Soft, tenderness palpation bilateral lower quadrants, nondistended. Neuro:  GCS 15, moving all four extremities, interacting appropriately. Speech clear. Psych:  Calm, appropriate.   Skin:  Warm, dry, no rash.    ED Results / Procedures / Treatments   Labs (all labs ordered are listed, but only abnormal results are displayed) Labs Reviewed  COMPREHENSIVE METABOLIC PANEL - Abnormal; Notable for the following components:      Result Value   Sodium 130 (*)    Potassium 2.5 (*)    Chloride 88 (*)    Glucose, Bld 160 (*)    All other components within normal limits  CBC - Abnormal; Notable for the following components:   WBC 11.6 (*)    RBC 3.64 (*)    Hemoglobin 11.7 (*)    HCT 35.1 (*)  All other components within normal limits  MAGNESIUM - Abnormal; Notable for the following components:   Magnesium 5.2 (*)    All other components within normal limits  MAGNESIUM - Abnormal; Notable for the following components:   Magnesium 4.2 (*)    All other components within normal limits  POTASSIUM - Abnormal; Notable for the following components:   Potassium 3.0 (*)    All other components within normal limits  LIPASE, BLOOD     EKG    RADIOLOGY Independently interpreted CT imaging and agree with radiology read   PROCEDURES:  Critical Care performed: No  Procedures   MEDICATIONS ORDERED IN ED: Medications  potassium chloride 10 mEq in 100 mL IVPB (0 mEq Intravenous Stopped 02/14/23 1312)  iohexol (OMNIPAQUE) 300 MG/ML solution 100 mL (100 mLs Intravenous Contrast Given 02/14/23 1101)  sorbitol, milk of mag, mineral oil, glycerin (SMOG) enema (960 mLs Rectal  Given 02/14/23 1535)  potassium chloride SA (KLOR-CON M) CR tablet 40 mEq (40 mEq Oral Given 02/14/23 1531)     IMPRESSION / MDM / ASSESSMENT AND PLAN / ED COURSE  I reviewed the triage vital signs and the nursing notes.                              Differential diagnosis includes, but is not limited to, constipation, SBO, pancreatitis, electrolyte abnormality  Patient's presentation is most consistent with acute complicated illness / injury requiring diagnostic workup.  Patient is an 89 year old female presenting today for lower abdominal pain and constipation x 5 days.  Intermittent nausea symptoms.  Vital signs otherwise stable and physical exam largely reassuring.  CT imaging was performed to evaluate for potential SBO.  No evidence of SBO but do see a large amount of stool throughout the colon and possible impaction.  Laboratory workup notable for hypokalemia and patient was given IV and oral repletion.  Patient underwent enema for symptomatic improvement.  Patient was signed out to oncoming provider while awaiting completion of enema.  If successful, she can likely be discharged on bowel regimen going home.  The patient is on the cardiac monitor to evaluate for evidence of arrhythmia and/or significant heart rate changes. Clinical Course as of 02/15/23 1478  Sat Feb 14, 2023  1038 Potassium(!!): 2.5 Hypokalemia present and will replete with IV at this time. [DW]  1140 CT ABDOMEN PELVIS W CONTRAST Large amounts of stool noted throughout the colon with possible impaction.  No obvious evidence of small bowel obstruction per my interpretation [DW]    Clinical Course User Index [DW] Janith Lima, MD     FINAL CLINICAL IMPRESSION(S) / ED DIAGNOSES   Final diagnoses:  Constipation, unspecified constipation type  Fecal impaction (HCC)  Hypokalemia  Hypermagnesemia     Rx / DC Orders   ED Discharge Orders          Ordered    potassium chloride SA (KLOR-CON M) 20 MEQ tablet   Daily        02/14/23 1659    docusate sodium (COLACE) 100 MG capsule  2 times daily        02/14/23 1659             Note:  This document was prepared using Dragon voice recognition software and may include unintentional dictation errors.   Janith Lima, MD 02/15/23 [id number]

## 2023-02-14 NOTE — Discharge Instructions (Addendum)
You were seen in the emergency department today for constipation.  We recommend that you use one or more of the following over-the-counter medications in the order described:   1)  Colace (or Dulcolax) 100 mg:  This is a stool softener, and you may take it once or twice a day as needed. 2)  Senna tablets:  This is a bowel stimulant that will help "push" out your stool. It is the next step to add after you have tried a stool softener. 3)  Miralax (powder):  This medication works by drawing additional fluid into your intestines and helps to flush out your stool.  Mix the powder with water or juice according to label instructions.  It may help if the Colace and Senna are not sufficient, but you must be sure to use the recommended amount of water or juice when you mix up the powder.  Remember that narcotic pain medications are constipating, so avoid them or minimize their use.  Drink plenty of fluids.  Please return to the Emergency Department immediately if you develop new or worsening symptoms that concern you, such as (but not limited to) fever > 101 degrees, severe abdominal pain, or persistent vomiting.  Please stop taking your magnesium tablets and follow-up with your primary care doctor for recheck of electrolytes.

## 2023-02-14 NOTE — ED Notes (Signed)
Pt arrives from home via EMS. Pt had spinal stenosis surgery five days ago and has not had a BM since than.

## 2023-02-16 ENCOUNTER — Telehealth: Payer: Self-pay | Admitting: Internal Medicine

## 2023-02-16 NOTE — Telephone Encounter (Signed)
Thayer Ohm with [facility] is aware.

## 2023-02-16 NOTE — Telephone Encounter (Signed)
See message below about drug interaction

## 2023-02-16 NOTE — Telephone Encounter (Signed)
[facility] PT called and would like to report level 1 interaction with  Diltiazem and interacts with Lovastatin. He would like to know if ok to keep taking the medication. Please call [phone]

## 2023-02-16 NOTE — Telephone Encounter (Signed)
Ok to continue her chronic medications - lovastatin and diltiazem/cardizem

## 2023-02-19 DIAGNOSIS — M069 Rheumatoid arthritis, unspecified: Secondary | ICD-10-CM | POA: Diagnosis not present

## 2023-02-19 DIAGNOSIS — Z4789 Encounter for other orthopedic aftercare: Secondary | ICD-10-CM | POA: Diagnosis not present

## 2023-02-19 DIAGNOSIS — M48062 Spinal stenosis, lumbar region with neurogenic claudication: Secondary | ICD-10-CM | POA: Diagnosis not present

## 2023-02-19 DIAGNOSIS — K219 Gastro-esophageal reflux disease without esophagitis: Secondary | ICD-10-CM | POA: Diagnosis not present

## 2023-02-19 DIAGNOSIS — I10 Essential (primary) hypertension: Secondary | ICD-10-CM | POA: Diagnosis not present

## 2023-02-19 DIAGNOSIS — M4808 Spinal stenosis, sacral and sacrococcygeal region: Secondary | ICD-10-CM | POA: Diagnosis not present

## 2023-02-20 ENCOUNTER — Ambulatory Visit: Payer: Medicare Other | Admitting: Internal Medicine

## 2023-02-20 VITALS — BP 138/74 | HR 75 | Temp 98.2°F | Resp 16 | Ht 61.0 in | Wt 92.8 lb

## 2023-02-20 DIAGNOSIS — I10 Essential (primary) hypertension: Secondary | ICD-10-CM | POA: Diagnosis not present

## 2023-02-20 DIAGNOSIS — K5900 Constipation, unspecified: Secondary | ICD-10-CM | POA: Diagnosis not present

## 2023-02-20 DIAGNOSIS — F439 Reaction to severe stress, unspecified: Secondary | ICD-10-CM

## 2023-02-20 DIAGNOSIS — E876 Hypokalemia: Secondary | ICD-10-CM

## 2023-02-20 DIAGNOSIS — M4808 Spinal stenosis, sacral and sacrococcygeal region: Secondary | ICD-10-CM | POA: Diagnosis not present

## 2023-02-20 DIAGNOSIS — M069 Rheumatoid arthritis, unspecified: Secondary | ICD-10-CM | POA: Diagnosis not present

## 2023-02-20 DIAGNOSIS — E7800 Pure hypercholesterolemia, unspecified: Secondary | ICD-10-CM | POA: Diagnosis not present

## 2023-02-20 DIAGNOSIS — K219 Gastro-esophageal reflux disease without esophagitis: Secondary | ICD-10-CM | POA: Diagnosis not present

## 2023-02-20 DIAGNOSIS — M48062 Spinal stenosis, lumbar region with neurogenic claudication: Secondary | ICD-10-CM | POA: Diagnosis not present

## 2023-02-20 DIAGNOSIS — E871 Hypo-osmolality and hyponatremia: Secondary | ICD-10-CM | POA: Diagnosis not present

## 2023-02-20 DIAGNOSIS — Z4789 Encounter for other orthopedic aftercare: Secondary | ICD-10-CM | POA: Diagnosis not present

## 2023-02-20 LAB — CBC WITH DIFFERENTIAL/PLATELET
Basophils Absolute: 0.1 10*3/uL (ref 0.0–0.1)
Basophils Relative: 1 % (ref 0.0–3.0)
Eosinophils Absolute: 0.1 10*3/uL (ref 0.0–0.7)
Eosinophils Relative: 1.9 % (ref 0.0–5.0)
HCT: 32.9 % — ABNORMAL LOW (ref 36.0–46.0)
Hemoglobin: 10.7 g/dL — ABNORMAL LOW (ref 12.0–15.0)
Lymphocytes Relative: 33.6 % (ref 12.0–46.0)
Lymphs Abs: 2.6 10*3/uL (ref 0.7–4.0)
MCHC: 32.4 g/dL (ref 30.0–36.0)
MCV: 98.4 fL (ref 78.0–100.0)
Monocytes Absolute: 1 10*3/uL (ref 0.1–1.0)
Monocytes Relative: 13 % — ABNORMAL HIGH (ref 3.0–12.0)
Neutro Abs: 4 10*3/uL (ref 1.4–7.7)
Neutrophils Relative %: 50.5 % (ref 43.0–77.0)
Platelets: 446 10*3/uL — ABNORMAL HIGH (ref 150.0–400.0)
RBC: 3.34 Mil/uL — ABNORMAL LOW (ref 3.87–5.11)
RDW: 16.8 % — ABNORMAL HIGH (ref 11.5–15.5)
WBC: 7.8 10*3/uL (ref 4.0–10.5)

## 2023-02-20 LAB — BASIC METABOLIC PANEL
BUN: 20 mg/dL (ref 6–23)
CO2: 26 meq/L (ref 19–32)
Calcium: 9.5 mg/dL (ref 8.4–10.5)
Chloride: 98 meq/L (ref 96–112)
Creatinine, Ser: 0.84 mg/dL (ref 0.40–1.20)
GFR: 62.29 mL/min (ref >60.00)
Glucose, Bld: 96 mg/dL (ref 70–99)
Potassium: 4 meq/L (ref 3.5–5.1)
Sodium: 133 meq/L — ABNORMAL LOW (ref 135–145)

## 2023-02-20 LAB — MAGNESIUM: Magnesium: 1.8 mg/dL (ref 1.5–2.5)

## 2023-02-20 NOTE — Progress Notes (Unsigned)
   REFERRING PHYSICIAN:  Dale [place], Md [address] [place],  [place] [address]  DOS: 02/09/23  lumbar decompression L2-L4  HISTORY OF PRESENT ILLNESS: Stacey Snyder is approximately 2 weeks status post above surgery. Was given ultram and robaxin on discharge from the hospital.   Was seen in ED on 02/14/23 for constipation.   Her preop leg pain is gone! She has minimal back soreness. She still feels a little weak overall- saw her PCP last week and had labs done.    PHYSICAL EXAMINATION:  General: Patient is well developed, well nourished, calm, collected, and in no apparent distress.   NEUROLOGICAL:  General: In no acute distress.   Awake, alert, oriented to person, place, and time.  Pupils equal round and reactive to light.  Facial tone is symmetric.     Strength:            Side Iliopsoas Quads Hamstring PF DF EHL  R 5 5 5 5 5 5   L 5 5 5 5 5 5    Incision c/d/i   ROS (Neurologic):  Negative except as noted above  IMAGING: Nothing new to review.   ASSESSMENT/PLAN:  Stacey Snyder is doing well s/p above surgery. Treatment options reviewed with patient and following plan made:   - I have advised the patient to lift up to 10 pounds until 6 weeks after surgery (follow up with Dr. Myer Haff).  - Reviewed wound care.  - No bending, twisting, or lifting.  - Follow up with PCP as scheduled. - Follow up as scheduled in 4 weeks and prn.   Advised to contact the office if any questions or concerns arise.  BP was slightly elevated. She states this is normal for her. No symptoms of chest pain, shortness of breath, blurry vision, or headaches. She will recheck at home and follow up with PCP as scheduled. If she develops CP, SOB, blurry vision, or headaches, then she will go to ED.     Drake Leach PA-C Department of neurosurgery

## 2023-02-20 NOTE — Progress Notes (Unsigned)
Subjective:    Patient ID: Stacey Snyder, female    DOB: 28-Apr-1933, 89 y.o.   MRN: [id number]  Patient here for  Chief Complaint  Patient presents with  . Hospitalization Follow-up    HPI Here for hospital follow up.  Admitted 02/09/23 - 02/10/23 - with lumbar stenosis with neurogenic claudication. Is status post L2-4 lumbar decompression. Her intraoperative course was uncomplicated. She was admitted overnight for pain control, therapy evaluation, and drain output monitoring. Her drain output reduced to an acceptable level and was removed on the afternoon of postop day 1. She is seen and evaluated by physical therapy and deemed appropriate for discharge home on postop day 1 with home health. Discharged home with tramadol, robaxin and senna. Evaluated in ER 02/14/23 - with abdominal pain. No bowel movement in 5 days. Tried milk of magnesia, dulcolax and enema without relief. In ER - given enema.  Found to have high magnesium and low potassium.    Past Medical History:  Diagnosis Date  . Anemia   . Diverticulosis   . Fibrocystic breast disease   . GERD (gastroesophageal reflux disease)   . Glaucoma 12/04/2020  . History of kidney stones   . Hypercholesterolemia   . Hypertension   . Hypertension   . IBS (irritable bowel syndrome)   . Inflammatory arthritis    positive anti-CCP abs, s/p prednisone, MTX, Remicade  . Meningioma (HCC)   . Nephrolithiasis   . Osteopenia    GI upset with Fosamax  . Pancreatitis 2007   s/p ERCP  . Pancreatitis   . PONV (postoperative nausea and vomiting)   . Renal insufficiency   . Spinal stenosis of lumbosacral region    Past Surgical History:  Procedure Laterality Date  . ABDOMINAL HYSTERECTOMY  1993   ovaries not removed  . BREAST BIOPSY Left 1984   negative  . BREAST CYST EXCISION Left 2010   negative  . COLONOSCOPY N/A 12/05/2020   Procedure: COLONOSCOPY;  Surgeon: Regis Bill, MD;  Location: [facility] ENDOSCOPY;  Service:  Endoscopy;  Laterality: N/A;  . EYE SURGERY     cataract bilat  . LITHOTRIPSY    . LUMBAR LAMINECTOMY  1983  . LUMBAR LAMINECTOMY/DECOMPRESSION MICRODISCECTOMY N/A 08/18/2016   Procedure: LUMBAR LAMINECTOMY/DECOMPRESSION MICRODISCECTOMY 1 LEVEL;  Surgeon: Keith Rake, MD;  Location: [facility] ORS;  Service: Neurosurgery;  Laterality: N/A;  L4-5 Laminectomy, MIS, L5 foraminotomy  . LUMBAR LAMINECTOMY/DECOMPRESSION MICRODISCECTOMY Left 02/09/2023   Procedure: L2-4 POSTERIOR SPINAL DECOMPRESSION;  Surgeon: Venetia Night, MD;  Location: [facility] ORS;  Service: Neurosurgery;  Laterality: Left;  . TRIGGER FINGER RELEASE     right ring finger   Family History  Problem Relation Age of Onset  . Ovarian cancer Mother   . Renal Disease Mother   . Heart disease Mother   . Diabetes Mother   . Hypertension Mother   . Heart disease Father   . Breast cancer Neg Hx    Social History   Socioeconomic History  . Marital status: Widowed    Spouse name: Not on file  . Number of children: 4  . Years of education: Not on file  . Highest education level: Not on file  Occupational History  . Not on file  Tobacco Use  . Smoking status: Never  . Smokeless tobacco: Never  Vaping Use  . Vaping status: Never Used  Substance and Sexual Activity  . Alcohol use: No    Alcohol/week: 0.0 standard drinks of alcohol  .  Drug use: No  . Sexual activity: Not on file  Other Topics Concern  . Not on file  Social History Narrative   Randie Heinz grandson is living with her   Social Determinants of Health   Financial Resource Strain: Not on file  Food Insecurity: No Food Insecurity (02/09/2023)   Hunger Vital Sign   . Worried About Programme researcher, broadcasting/film/video in the Last Year: Never true   . Ran Out of Food in the Last Year: Never true  Transportation Needs: No Transportation Needs (02/09/2023)   PRAPARE - Transportation   . Lack of Transportation (Medical): No   . Lack of Transportation (Non-Medical): No  Physical Activity:  Not on file  Stress: Not on file  Social Connections: Not on file     Review of Systems     Objective:     BP 138/74   Pulse 75   Temp 98.2 F (36.8 C)   Resp 16   Ht 5\' 1"  (1.549 m)   Wt 92 lb 12.8 oz (42.1 kg)   SpO2 97%   BMI 17.53 kg/m  Wt Readings from Last 3 Encounters:  02/20/23 92 lb 12.8 oz (42.1 kg)  02/14/23 92 lb 6 oz (41.9 kg)  02/09/23 92 lb 6 oz (41.9 kg)    Physical Exam   Outpatient Encounter Medications as of 02/20/2023  Medication Sig  . acetaminophen (TYLENOL) 500 MG tablet Take 1,000 mg by mouth in the morning and at bedtime.  . Cholecalciferol (VITAMIN D) 2000 UNITS tablet Take 2,000 Units by mouth daily.  Marland Kitchen diltiazem (DILT-XR) 240 MG 24 hr capsule Take 1 capsule (240 mg total) by mouth daily.  Marland Kitchen docusate sodium (COLACE) 100 MG capsule Take 1 capsule (100 mg total) by mouth 2 (two) times daily.  . dorzolamide-timolol (COSOPT) 22.3-6.8 MG/ML ophthalmic solution INSTILL 1 DROP INTO EACH EYE TWICE DAILY  . fluticasone (FLONASE) 50 MCG/ACT nasal spray USE TWO SPRAY(S) IN EACH NOSTRIL ONCE DAILY  . folic acid (FOLVITE) 1 MG tablet Take 1 mg by mouth daily.  Marland Kitchen gabapentin (NEURONTIN) 300 MG capsule Take 1 capsule (300 mg total) by mouth at bedtime.  . InFLIXimab (REMICADE IV) Inject into the vein every 6 (six) weeks. Per Dr Gavin Potters  . latanoprost (XALATAN) 0.005 % ophthalmic solution Place 1 drop into both eyes at bedtime.  . lovastatin (MEVACOR) 40 MG tablet Take 1 tablet (40 mg total) by mouth daily.  Marland Kitchen MAGNESIUM OXIDE 400 PO Take 1 tablet by mouth daily.  . methocarbamol (ROBAXIN) 500 MG tablet Take 1 tablet (500 mg total) by mouth every 8 (eight) hours as needed for muscle spasms.  . methotrexate 25 MG/ML SOLN Inject 17.5 mg into the skin once a week. Patient takes 17.5 mg (0.7 ml) on Monday or Tuesday of each week.  . Multiple Vitamins-Minerals (PRESERVISION AREDS) TABS Take 1 tablet by mouth in the morning and at bedtime.  Marland Kitchen omeprazole (PRILOSEC)  20 MG capsule Take 1 capsule (20 mg total) by mouth daily.  . potassium chloride SA (KLOR-CON M) 20 MEQ tablet Take 1 tablet (20 mEq total) by mouth daily for 5 days.  . prednisoLONE acetate (PRED FORTE) 1 % ophthalmic suspension Place 1 drop into both eyes daily. am  . Probiotic Product (ALIGN) 4 MG CAPS Take one capsule daily  . psyllium (METAMUCIL) 58.6 % packet Take 1 packet by mouth daily.  Marland Kitchen senna (SENOKOT) 8.6 MG TABS tablet Take 1 tablet (8.6 mg total) by mouth daily as needed  for mild constipation.  . traMADol (ULTRAM) 50 MG tablet Take 1 tablet (50 mg total) by mouth every 6 (six) hours.  . valsartan (DIOVAN) 160 MG tablet TAKE 1 TABLET(160 MG) BY MOUTH DAILY   No facility-administered encounter medications on file as of 02/20/2023.     Lab Results  Component Value Date   WBC 11.6 (H) 02/14/2023   HGB 11.7 (L) 02/14/2023   HCT 35.1 (L) 02/14/2023   PLT 398 02/14/2023   GLUCOSE 160 (H) 02/14/2023   CHOL 151 01/20/2023   TRIG 126.0 01/20/2023   HDL 74.90 01/20/2023   LDLCALC 51 01/20/2023   ALT 28 02/14/2023   AST 35 02/14/2023   NA 130 (L) 02/14/2023   K 3.0 (L) 02/14/2023   CL 88 (L) 02/14/2023   CREATININE 0.85 02/14/2023   BUN 17 02/14/2023   CO2 31 02/14/2023   TSH 1.55 05/26/2022   INR 1.0 12/03/2020    CT ABDOMEN PELVIS W CONTRAST  Result Date: 02/14/2023 CLINICAL DATA:  Diffuse lower abdominal pain with tenderness to palpation. No bowel movement for 5 days. Evaluate for fecal impaction or small bowel obstruction. EXAM: CT ABDOMEN AND PELVIS WITH CONTRAST TECHNIQUE: Multidetector CT imaging of the abdomen and pelvis was performed using the standard protocol following bolus administration of intravenous contrast. RADIATION DOSE REDUCTION: This exam was performed according to the departmental dose-optimization program which includes automated exposure control, adjustment of the mA and/or kV according to patient size and/or use of iterative reconstruction technique.  CONTRAST:  OMNIPAQUE IOHEXOL 300 MG/ML  SOLN COMPARISON:  12/03/2020 FINDINGS: Lower chest: No acute abnormality. Hepatobiliary: No focal liver abnormality. The gallbladder appears within normal limits. No intrahepatic bile duct dilatation. CBD measures up to 7 mm proximally. Pancreas: Multiple cystic lesions are identified throughout the pancreas as noted on the previous exam. These have been present dating back to 03/05/2016 and exhibit mild gradual increase in size over time. Although etiology is indeterminate findings are favored multiple benign cystic pancreatic lesions. The largest arises off the neck/body measuring 2.3 cm. This is compared with 2.7 cm on the prior exam. The main duct is upper limits of normal measuring 3 mm. No inflammatory change. Spleen: Normal in size without focal abnormality. Adrenals/Urinary Tract: Normal adrenal glands. Upper pole left kidney calcification is likely vascular. No definite renal calculi or signs of obstructive uropathy. Several small cysts are noted the largest arises off the lower pole of left kidney measuring 0.9 cm. No follow-up imaging recommended. Right kidney myelolipoma is also noted measuring 5 mm and no follow-up imaging of this lesion is advised. Bladder appears normal. Stomach/Bowel: The stomach is nondistended. No pathologic dilatation of the small bowel loops to suggest a small bowel obstruction. No small bowel wall thickening or inflammatory changes. Terminal ileum appears normal. There is a moderate stool burden identified throughout the colon up to the level of the rectum. No signs of rectal impaction. No colonic wall thickening or pericolonic inflammatory fat stranding. Vascular/Lymphatic: Aortic atherosclerosis. No aneurysm. The upper abdominal vascularity is patent. Chronic appearing high-grade stenosis versus occlusion of the proximal SMA is identified with signs of distal reconstitution, image 15/2 and image 23/2. This is a new finding compared  with the previous exam. No signs of abdominopelvic adenopathy. Reproductive: Status post hysterectomy.  No adnexal mass identified. Other: Trace fluid identified within the right posterior pelvis. No signs of pneumoperitoneum. Musculoskeletal: No acute or significant osseous findings. IMPRESSION: 1. No acute findings within the abdomen or pelvis. 2.  Moderate stool burden identified throughout the colon up to the level of the rectum. No signs of rectal impaction. 3. Chronic appearing occlusion with absent opacification of the proximal SMA with distal reconstitution. This is a new finding compared with the previous exam. No signs to suggest bowel ischemia identified. Specifically no abnormal wall thickening, inflammation, or pneumatosis. 4. Multiple cystic lesions are identified throughout the pancreas as noted on the previous exam. These have been present dating back to 03/05/2016 and exhibit mild gradual increase in size over time. Although etiology is indeterminate findings are favored multiple benign cystic pancreatic lesions. Consider follow-up MRI as clinically indicated. 5. Trace fluid identified within the right posterior pelvis. 6.  Aortic Atherosclerosis (ICD10-I70.0). Electronically Signed   By: Signa Kell M.D.   On: 02/14/2023 13:14       Assessment & Plan:  Hyponatremia -     Basic metabolic panel  Hypercholesterolemia -     CBC with Differential/Platelet  Constipation, unspecified constipation type  Hypomagnesemia -     Magnesium     Dale Mohall, MD

## 2023-02-21 ENCOUNTER — Encounter: Payer: Self-pay | Admitting: Internal Medicine

## 2023-02-21 NOTE — Assessment & Plan Note (Signed)
Continue lovastatin.  Follow lipid panel and liver function tests.   

## 2023-02-21 NOTE — Assessment & Plan Note (Signed)
Found to have low potassium level in ER.  Replaced.  Recheck potassium today.

## 2023-02-21 NOTE — Assessment & Plan Note (Signed)
Does not feel needs any further intervention.  Follow.

## 2023-02-21 NOTE — Assessment & Plan Note (Signed)
On diovan and diltiazem.   Blood pressures as outlined.  No changes. Follow metabolic panel.

## 2023-02-21 NOTE — Assessment & Plan Note (Signed)
Followed by rheumatology.  Has been receiving Remicade and MTX.  

## 2023-02-21 NOTE — Assessment & Plan Note (Signed)
Magnesium level elevated in ER.  Off magnesium.  Check magnesium level today.  Remain off supplements.

## 2023-02-21 NOTE — Assessment & Plan Note (Signed)
Evaluated recently in ER as outlined.  Since ER visit, taking miralax.  Bowels moving now.  Last bowel movement this am. No abdominal pain. Continue miralax as directed.  Follow.

## 2023-02-24 ENCOUNTER — Telehealth: Payer: Self-pay

## 2023-02-24 ENCOUNTER — Ambulatory Visit (INDEPENDENT_AMBULATORY_CARE_PROVIDER_SITE_OTHER): Payer: Medicare Other | Admitting: Orthopedic Surgery

## 2023-02-24 ENCOUNTER — Encounter: Payer: Self-pay | Admitting: Orthopedic Surgery

## 2023-02-24 VITALS — BP 144/54 | HR 58 | Temp 98.2°F

## 2023-02-24 DIAGNOSIS — M48062 Spinal stenosis, lumbar region with neurogenic claudication: Secondary | ICD-10-CM

## 2023-02-24 DIAGNOSIS — Z09 Encounter for follow-up examination after completed treatment for conditions other than malignant neoplasm: Secondary | ICD-10-CM

## 2023-02-24 NOTE — Telephone Encounter (Signed)
Pt called back and I read the note and she has a lab appointment scheduled on 11/18

## 2023-02-24 NOTE — Telephone Encounter (Signed)
-----   Message from [place] sent at 02/22/2023 11:29 PM EDT ----- Notify - hgb has decreased from recent checks.  White count wnl.  Platelet count slightly increased.  Can be related to inflammation.  Recommend recheck cbc and iron studies within the next couple of weeks. Sodium improved from last check.  Potassium wnl. Magnesium level wnl.  Kidney function improved.

## 2023-02-24 NOTE — Telephone Encounter (Signed)
noted 

## 2023-02-24 NOTE — Patient Instructions (Addendum)
It was nice to see you today.   I am glad that you are feeling better!   Okay to get incision wet in the shower, do not submerge in pool or hot tub.   Call if any concerns about the incision such as redness, drainage, or fever/chills.   Try to avoid bending, twisting, or lifting. You can lift up to 10 pounds until your follow up with Dr.  Myer Haff in 4 weeks.   Your blood pressure was slightly elevated today. I want you to recheck it at home and follow up with your PCP as scheduled.  If you have any chest pain, shortness of breath, blurry vision, or headaches then you need to go to ED.    We will see you back in 4 weeks for your 6 weeks postop visit.   Please call with any questions or concerns.   Drake Leach PA-C [phone]     The physicians and staff at [facility] are committed to providing excellent care. You may receive a survey asking for feedback about your experience at our office. We value you your feedback and appreciate you taking the time to to fill it out. The [facility] leadership team is also available to discuss your experience in person, feel free to contact us [phone].

## 2023-02-25 ENCOUNTER — Other Ambulatory Visit: Payer: Self-pay

## 2023-02-25 DIAGNOSIS — I10 Essential (primary) hypertension: Secondary | ICD-10-CM | POA: Diagnosis not present

## 2023-02-25 DIAGNOSIS — Z4789 Encounter for other orthopedic aftercare: Secondary | ICD-10-CM | POA: Diagnosis not present

## 2023-02-25 DIAGNOSIS — M069 Rheumatoid arthritis, unspecified: Secondary | ICD-10-CM | POA: Diagnosis not present

## 2023-02-25 DIAGNOSIS — M48062 Spinal stenosis, lumbar region with neurogenic claudication: Secondary | ICD-10-CM | POA: Diagnosis not present

## 2023-02-25 DIAGNOSIS — K219 Gastro-esophageal reflux disease without esophagitis: Secondary | ICD-10-CM | POA: Diagnosis not present

## 2023-02-25 DIAGNOSIS — M4808 Spinal stenosis, sacral and sacrococcygeal region: Secondary | ICD-10-CM | POA: Diagnosis not present

## 2023-03-03 DIAGNOSIS — M4808 Spinal stenosis, sacral and sacrococcygeal region: Secondary | ICD-10-CM | POA: Diagnosis not present

## 2023-03-03 DIAGNOSIS — K219 Gastro-esophageal reflux disease without esophagitis: Secondary | ICD-10-CM | POA: Diagnosis not present

## 2023-03-03 DIAGNOSIS — M48062 Spinal stenosis, lumbar region with neurogenic claudication: Secondary | ICD-10-CM | POA: Diagnosis not present

## 2023-03-03 DIAGNOSIS — Z4789 Encounter for other orthopedic aftercare: Secondary | ICD-10-CM | POA: Diagnosis not present

## 2023-03-03 DIAGNOSIS — I10 Essential (primary) hypertension: Secondary | ICD-10-CM | POA: Diagnosis not present

## 2023-03-03 DIAGNOSIS — M069 Rheumatoid arthritis, unspecified: Secondary | ICD-10-CM | POA: Diagnosis not present

## 2023-03-10 ENCOUNTER — Other Ambulatory Visit: Payer: Medicare Other

## 2023-03-12 DIAGNOSIS — M069 Rheumatoid arthritis, unspecified: Secondary | ICD-10-CM | POA: Diagnosis not present

## 2023-03-12 DIAGNOSIS — K219 Gastro-esophageal reflux disease without esophagitis: Secondary | ICD-10-CM | POA: Diagnosis not present

## 2023-03-12 DIAGNOSIS — M48062 Spinal stenosis, lumbar region with neurogenic claudication: Secondary | ICD-10-CM | POA: Diagnosis not present

## 2023-03-12 DIAGNOSIS — I10 Essential (primary) hypertension: Secondary | ICD-10-CM | POA: Diagnosis not present

## 2023-03-12 DIAGNOSIS — Z4789 Encounter for other orthopedic aftercare: Secondary | ICD-10-CM | POA: Diagnosis not present

## 2023-03-12 DIAGNOSIS — M4808 Spinal stenosis, sacral and sacrococcygeal region: Secondary | ICD-10-CM | POA: Diagnosis not present

## 2023-03-14 DIAGNOSIS — Z9181 History of falling: Secondary | ICD-10-CM | POA: Diagnosis not present

## 2023-03-14 DIAGNOSIS — M48062 Spinal stenosis, lumbar region with neurogenic claudication: Secondary | ICD-10-CM | POA: Diagnosis not present

## 2023-03-14 DIAGNOSIS — Z79891 Long term (current) use of opiate analgesic: Secondary | ICD-10-CM | POA: Diagnosis not present

## 2023-03-14 DIAGNOSIS — Z79899 Other long term (current) drug therapy: Secondary | ICD-10-CM | POA: Diagnosis not present

## 2023-03-14 DIAGNOSIS — E7800 Pure hypercholesterolemia, unspecified: Secondary | ICD-10-CM | POA: Diagnosis not present

## 2023-03-14 DIAGNOSIS — K219 Gastro-esophageal reflux disease without esophagitis: Secondary | ICD-10-CM | POA: Diagnosis not present

## 2023-03-14 DIAGNOSIS — H409 Unspecified glaucoma: Secondary | ICD-10-CM | POA: Diagnosis not present

## 2023-03-14 DIAGNOSIS — Z4789 Encounter for other orthopedic aftercare: Secondary | ICD-10-CM | POA: Diagnosis not present

## 2023-03-14 DIAGNOSIS — Z452 Encounter for adjustment and management of vascular access device: Secondary | ICD-10-CM | POA: Diagnosis not present

## 2023-03-14 DIAGNOSIS — I10 Essential (primary) hypertension: Secondary | ICD-10-CM | POA: Diagnosis not present

## 2023-03-14 DIAGNOSIS — M069 Rheumatoid arthritis, unspecified: Secondary | ICD-10-CM | POA: Diagnosis not present

## 2023-03-14 DIAGNOSIS — M4808 Spinal stenosis, sacral and sacrococcygeal region: Secondary | ICD-10-CM | POA: Diagnosis not present

## 2023-03-23 ENCOUNTER — Other Ambulatory Visit (INDEPENDENT_AMBULATORY_CARE_PROVIDER_SITE_OTHER): Payer: Medicare Other

## 2023-03-23 DIAGNOSIS — D649 Anemia, unspecified: Secondary | ICD-10-CM

## 2023-03-23 DIAGNOSIS — R7989 Other specified abnormal findings of blood chemistry: Secondary | ICD-10-CM | POA: Diagnosis not present

## 2023-03-23 DIAGNOSIS — M0579 Rheumatoid arthritis with rheumatoid factor of multiple sites without organ or systems involvement: Secondary | ICD-10-CM | POA: Diagnosis not present

## 2023-03-23 DIAGNOSIS — I10 Essential (primary) hypertension: Secondary | ICD-10-CM

## 2023-03-23 LAB — CBC WITH DIFFERENTIAL/PLATELET
Basophils Absolute: 0.1 10*3/uL (ref 0.0–0.1)
Basophils Relative: 0.9 % (ref 0.0–3.0)
Eosinophils Absolute: 0.2 10*3/uL (ref 0.0–0.7)
Eosinophils Relative: 3.2 % (ref 0.0–5.0)
HCT: 33.3 % — ABNORMAL LOW (ref 36.0–46.0)
Hemoglobin: 10.9 g/dL — ABNORMAL LOW (ref 12.0–15.0)
Lymphocytes Relative: 41.7 % (ref 12.0–46.0)
Lymphs Abs: 2.4 10*3/uL (ref 0.7–4.0)
MCHC: 32.8 g/dL (ref 30.0–36.0)
MCV: 99.2 fL (ref 78.0–100.0)
Monocytes Absolute: 0.4 10*3/uL (ref 0.1–1.0)
Monocytes Relative: 6.2 % (ref 3.0–12.0)
Neutro Abs: 2.7 10*3/uL (ref 1.4–7.7)
Neutrophils Relative %: 48 % (ref 43.0–77.0)
Platelets: 325 10*3/uL (ref 150.0–400.0)
RBC: 3.36 Mil/uL — ABNORMAL LOW (ref 3.87–5.11)
RDW: 13.6 % (ref 11.5–15.5)
WBC: 5.7 10*3/uL (ref 4.0–10.5)

## 2023-03-23 LAB — IBC + FERRITIN
Ferritin: 202.6 ng/mL (ref 10.0–291.0)
Iron: 37 ug/dL — ABNORMAL LOW (ref 42–145)
Saturation Ratios: 13.4 % — ABNORMAL LOW (ref 20.0–50.0)
TIBC: 275.8 ug/dL (ref 250.0–450.0)
Transferrin: 197 mg/dL — ABNORMAL LOW (ref 212.0–360.0)

## 2023-03-23 LAB — BASIC METABOLIC PANEL
BUN: 18 mg/dL (ref 6–23)
CO2: 26 meq/L (ref 19–32)
Calcium: 9.1 mg/dL (ref 8.4–10.5)
Chloride: 101 meq/L (ref 96–112)
Creatinine, Ser: 0.75 mg/dL (ref 0.40–1.20)
GFR: 71.32 mL/min (ref >60.00)
Glucose, Bld: 110 mg/dL — ABNORMAL HIGH (ref 70–99)
Potassium: 3.6 meq/L (ref 3.5–5.1)
Sodium: 138 meq/L (ref 135–145)

## 2023-03-24 ENCOUNTER — Ambulatory Visit (INDEPENDENT_AMBULATORY_CARE_PROVIDER_SITE_OTHER): Payer: Medicare Other | Admitting: Neurosurgery

## 2023-03-24 ENCOUNTER — Telehealth: Payer: Self-pay

## 2023-03-24 ENCOUNTER — Encounter: Payer: Self-pay | Admitting: Neurosurgery

## 2023-03-24 VITALS — BP 140/64 | Ht 61.0 in | Wt 92.0 lb

## 2023-03-24 DIAGNOSIS — M48062 Spinal stenosis, lumbar region with neurogenic claudication: Secondary | ICD-10-CM

## 2023-03-24 DIAGNOSIS — Z09 Encounter for follow-up examination after completed treatment for conditions other than malignant neoplasm: Secondary | ICD-10-CM

## 2023-03-24 NOTE — Telephone Encounter (Signed)
-----   Message from [place] sent at 03/24/2023  5:02 AM EST ----- Please call and notify - sodium and potassium wnl. Kidney function wnl. Platelet count improved and now wnl. Hgb remains decreased.  Stable from last check, but still decreased. Is she taking any iron or taking a multivitamin with iron?

## 2023-03-24 NOTE — Progress Notes (Signed)
   REFERRING PHYSICIAN:  Dale [place], Md [address] [place],  [place] [address]  DOS: 02/09/23  lumbar decompression L2-L4  HISTORY OF PRESENT ILLNESS: Stacey Snyder is status post above surgery.   She is doing extremely well.  She is having some left shoulder pain. PHYSICAL EXAMINATION:  General: Patient is well developed, well nourished, calm, collected, and in no apparent distress.   NEUROLOGICAL:  General: In no acute distress.   Awake, alert, oriented to person, place, and time.  Pupils equal round and reactive to light.  Facial tone is symmetric.     Strength:            Side Iliopsoas Quads Hamstring PF DF EHL  R 5 5 5 5 5 5   L 5 5 5 5 5 5    Incision c/d/i   ROS (Neurologic):  Negative except as noted above  IMAGING: Nothing new to review.   ASSESSMENT/PLAN:  Stacey Snyder is doing well s/p above surgery.   I am very pleased with her response to surgery.  I offered her referral back to orthopedics for her shoulder, but she would like to wait for now.  Venetia Night MD Department of neurosurgery

## 2023-03-24 NOTE — Telephone Encounter (Signed)
Lvm for pt to give office a call back in regards to lab results

## 2023-03-25 DIAGNOSIS — M4808 Spinal stenosis, sacral and sacrococcygeal region: Secondary | ICD-10-CM | POA: Diagnosis not present

## 2023-03-25 DIAGNOSIS — K219 Gastro-esophageal reflux disease without esophagitis: Secondary | ICD-10-CM | POA: Diagnosis not present

## 2023-03-25 DIAGNOSIS — M069 Rheumatoid arthritis, unspecified: Secondary | ICD-10-CM | POA: Diagnosis not present

## 2023-03-25 DIAGNOSIS — I10 Essential (primary) hypertension: Secondary | ICD-10-CM | POA: Diagnosis not present

## 2023-03-25 DIAGNOSIS — Z4789 Encounter for other orthopedic aftercare: Secondary | ICD-10-CM | POA: Diagnosis not present

## 2023-03-25 DIAGNOSIS — M48062 Spinal stenosis, lumbar region with neurogenic claudication: Secondary | ICD-10-CM | POA: Diagnosis not present

## 2023-03-25 NOTE — Telephone Encounter (Signed)
See result note.  

## 2023-03-25 NOTE — Telephone Encounter (Signed)
Pt called back and I read the note and she is not taking iron

## 2023-03-26 ENCOUNTER — Telehealth: Payer: Self-pay

## 2023-03-26 NOTE — Telephone Encounter (Signed)
Lvm for pt to give office a call back in regards to Dr. Roby Lofts  new recommendation  See message below

## 2023-03-26 NOTE — Telephone Encounter (Signed)
-----   Message from [place] sent at 03/25/2023  3:33 PM EST ----- Have her start ferrous sulfate 3 days per week.  We will follow.  Let me know if any GI issues.  Recheck cbc and iron studies in 4-6 weeks.

## 2023-04-04 DIAGNOSIS — K219 Gastro-esophageal reflux disease without esophagitis: Secondary | ICD-10-CM | POA: Diagnosis not present

## 2023-04-04 DIAGNOSIS — I10 Essential (primary) hypertension: Secondary | ICD-10-CM | POA: Diagnosis not present

## 2023-04-04 DIAGNOSIS — Z4789 Encounter for other orthopedic aftercare: Secondary | ICD-10-CM | POA: Diagnosis not present

## 2023-04-04 DIAGNOSIS — M069 Rheumatoid arthritis, unspecified: Secondary | ICD-10-CM | POA: Diagnosis not present

## 2023-04-04 DIAGNOSIS — M4808 Spinal stenosis, sacral and sacrococcygeal region: Secondary | ICD-10-CM | POA: Diagnosis not present

## 2023-04-04 DIAGNOSIS — M48062 Spinal stenosis, lumbar region with neurogenic claudication: Secondary | ICD-10-CM | POA: Diagnosis not present

## 2023-04-06 DIAGNOSIS — M48062 Spinal stenosis, lumbar region with neurogenic claudication: Secondary | ICD-10-CM | POA: Diagnosis not present

## 2023-04-06 DIAGNOSIS — Z4789 Encounter for other orthopedic aftercare: Secondary | ICD-10-CM | POA: Diagnosis not present

## 2023-04-06 DIAGNOSIS — K219 Gastro-esophageal reflux disease without esophagitis: Secondary | ICD-10-CM | POA: Diagnosis not present

## 2023-04-06 DIAGNOSIS — M4808 Spinal stenosis, sacral and sacrococcygeal region: Secondary | ICD-10-CM | POA: Diagnosis not present

## 2023-04-06 DIAGNOSIS — I10 Essential (primary) hypertension: Secondary | ICD-10-CM | POA: Diagnosis not present

## 2023-04-06 DIAGNOSIS — M069 Rheumatoid arthritis, unspecified: Secondary | ICD-10-CM | POA: Diagnosis not present

## 2023-04-21 NOTE — Progress Notes (Unsigned)
   REFERRING PHYSICIAN:  Dale [place], Md [address] [place],  [place] [address]  DOS: 02/09/23  lumbar decompression L2-L4  HISTORY OF PRESENT ILLNESS:  She was doing very well at her last visit.   She continues to do well. She has no back or leg pain. She is walking with no issues. She feels great!  PCP is treating her for low hemoglobin- she is on iron.    PHYSICAL EXAMINATION:  General: Patient is well developed, well nourished, calm, collected, and in no apparent distress.   NEUROLOGICAL:  General: In no acute distress.   Awake, alert, oriented to person, place, and time.  Pupils equal round and reactive to light.  Facial tone is symmetric.     Strength:            Side Iliopsoas Quads Hamstring PF DF EHL  R 5 5 5 5 5 5   L 5 5 5 5 5 5    Incision well healed.    ROS (Neurologic):  Negative except as noted above  IMAGING: Nothing new to review.   ASSESSMENT/PLAN:  Stacey Snyder is doing well s/p above surgery. Treatment options reviewed with patient and following plan made:   - She can slowly return to activity as tolerated. Care with lifting.  - Follow up prn.   Advised to contact the office if any questions or concerns arise.  Drake Leach PA-C Department of neurosurgery

## 2023-04-22 ENCOUNTER — Encounter: Payer: Self-pay | Admitting: Orthopedic Surgery

## 2023-04-22 ENCOUNTER — Ambulatory Visit (INDEPENDENT_AMBULATORY_CARE_PROVIDER_SITE_OTHER): Payer: Medicare Other | Admitting: Orthopedic Surgery

## 2023-04-22 VITALS — BP 136/72 | Ht 61.0 in | Wt 92.0 lb

## 2023-04-22 DIAGNOSIS — Z09 Encounter for follow-up examination after completed treatment for conditions other than malignant neoplasm: Secondary | ICD-10-CM

## 2023-04-22 DIAGNOSIS — M48062 Spinal stenosis, lumbar region with neurogenic claudication: Secondary | ICD-10-CM

## 2023-05-12 ENCOUNTER — Telehealth: Payer: Self-pay | Admitting: Internal Medicine

## 2023-05-12 ENCOUNTER — Other Ambulatory Visit: Payer: Self-pay

## 2023-05-12 DIAGNOSIS — D649 Anemia, unspecified: Secondary | ICD-10-CM

## 2023-05-12 DIAGNOSIS — E7800 Pure hypercholesterolemia, unspecified: Secondary | ICD-10-CM

## 2023-05-12 DIAGNOSIS — I10 Essential (primary) hypertension: Secondary | ICD-10-CM

## 2023-05-12 NOTE — Progress Notes (Signed)
 Labs ordered.

## 2023-05-12 NOTE — Telephone Encounter (Signed)
 Patient need lab orders.

## 2023-05-12 NOTE — Telephone Encounter (Signed)
 Labs ordered.

## 2023-05-18 ENCOUNTER — Other Ambulatory Visit (INDEPENDENT_AMBULATORY_CARE_PROVIDER_SITE_OTHER): Payer: Medicare Other

## 2023-05-18 DIAGNOSIS — I10 Essential (primary) hypertension: Secondary | ICD-10-CM | POA: Diagnosis not present

## 2023-05-18 DIAGNOSIS — E7800 Pure hypercholesterolemia, unspecified: Secondary | ICD-10-CM | POA: Diagnosis not present

## 2023-05-18 DIAGNOSIS — M0579 Rheumatoid arthritis with rheumatoid factor of multiple sites without organ or systems involvement: Secondary | ICD-10-CM | POA: Diagnosis not present

## 2023-05-18 DIAGNOSIS — D649 Anemia, unspecified: Secondary | ICD-10-CM

## 2023-05-18 LAB — IBC + FERRITIN
Ferritin: 119.9 ng/mL (ref 10.0–291.0)
Iron: 118 ug/dL (ref 42–145)
Saturation Ratios: 42.1 % (ref 20.0–50.0)
TIBC: 280 ug/dL (ref 250.0–450.0)
Transferrin: 200 mg/dL — ABNORMAL LOW (ref 212.0–360.0)

## 2023-05-18 LAB — HEPATIC FUNCTION PANEL
ALT: 12 U/L (ref 0–35)
AST: 22 U/L (ref 0–37)
Albumin: 4.1 g/dL (ref 3.5–5.2)
Alkaline Phosphatase: 87 U/L (ref 39–117)
Bilirubin, Direct: 0 mg/dL (ref 0.0–0.3)
Total Bilirubin: 0.4 mg/dL (ref 0.2–1.2)
Total Protein: 7.1 g/dL (ref 6.0–8.3)

## 2023-05-18 LAB — BASIC METABOLIC PANEL
BUN: 16 mg/dL (ref 6–23)
CO2: 29 meq/L (ref 19–32)
Calcium: 9 mg/dL (ref 8.4–10.5)
Chloride: 101 meq/L (ref 96–112)
Creatinine, Ser: 0.83 mg/dL (ref 0.40–1.20)
GFR: 63.08 mL/min (ref >60.00)
Glucose, Bld: 107 mg/dL — ABNORMAL HIGH (ref 70–99)
Potassium: 3.3 meq/L — ABNORMAL LOW (ref 3.5–5.1)
Sodium: 136 meq/L (ref 135–145)

## 2023-05-18 LAB — CBC WITH DIFFERENTIAL/PLATELET
Basophils Absolute: 0.1 10*3/uL (ref 0.0–0.1)
Basophils Relative: 0.8 % (ref 0.0–3.0)
Eosinophils Absolute: 0.2 10*3/uL (ref 0.0–0.7)
Eosinophils Relative: 2.9 % (ref 0.0–5.0)
HCT: 36.3 % (ref 36.0–46.0)
Hemoglobin: 12 g/dL (ref 12.0–15.0)
Lymphocytes Relative: 35.4 % (ref 12.0–46.0)
Lymphs Abs: 2.8 10*3/uL (ref 0.7–4.0)
MCHC: 33 g/dL (ref 30.0–36.0)
MCV: 96.6 fL (ref 78.0–100.0)
Monocytes Absolute: 0.6 10*3/uL (ref 0.1–1.0)
Monocytes Relative: 7.2 % (ref 3.0–12.0)
Neutro Abs: 4.3 10*3/uL (ref 1.4–7.7)
Neutrophils Relative %: 53.7 % (ref 43.0–77.0)
Platelets: 262 10*3/uL (ref 150.0–400.0)
RBC: 3.76 Mil/uL — ABNORMAL LOW (ref 3.87–5.11)
RDW: 13.5 % (ref 11.5–15.5)
WBC: 7.9 10*3/uL (ref 4.0–10.5)

## 2023-05-18 LAB — LIPID PANEL
Cholesterol: 180 mg/dL (ref 0–200)
HDL: 62.8 mg/dL (ref >39.00)
LDL Cholesterol: 61 mg/dL (ref 0–99)
NonHDL: 116.89
Total CHOL/HDL Ratio: 3
Triglycerides: 278 mg/dL — ABNORMAL HIGH (ref 0.0–149.0)
VLDL: 55.6 mg/dL — ABNORMAL HIGH (ref 0.0–40.0)

## 2023-05-18 LAB — TSH: TSH: 1.93 u[IU]/mL (ref 0.35–5.50)

## 2023-05-21 ENCOUNTER — Other Ambulatory Visit: Payer: Medicare Other

## 2023-05-25 ENCOUNTER — Encounter: Payer: Self-pay | Admitting: Internal Medicine

## 2023-05-25 ENCOUNTER — Ambulatory Visit: Payer: Medicare Other | Admitting: Internal Medicine

## 2023-05-25 VITALS — BP 134/70 | HR 65 | Temp 98.2°F | Resp 16 | Ht 61.0 in | Wt 97.0 lb

## 2023-05-25 DIAGNOSIS — L0292 Furuncle, unspecified: Secondary | ICD-10-CM

## 2023-05-25 DIAGNOSIS — M0579 Rheumatoid arthritis with rheumatoid factor of multiple sites without organ or systems involvement: Secondary | ICD-10-CM | POA: Diagnosis not present

## 2023-05-25 DIAGNOSIS — M545 Low back pain, unspecified: Secondary | ICD-10-CM | POA: Diagnosis not present

## 2023-05-25 DIAGNOSIS — I700 Atherosclerosis of aorta: Secondary | ICD-10-CM

## 2023-05-25 DIAGNOSIS — D329 Benign neoplasm of meninges, unspecified: Secondary | ICD-10-CM

## 2023-05-25 DIAGNOSIS — E7800 Pure hypercholesterolemia, unspecified: Secondary | ICD-10-CM

## 2023-05-25 DIAGNOSIS — R490 Dysphonia: Secondary | ICD-10-CM

## 2023-05-25 DIAGNOSIS — J3489 Other specified disorders of nose and nasal sinuses: Secondary | ICD-10-CM | POA: Diagnosis not present

## 2023-05-25 DIAGNOSIS — I10 Essential (primary) hypertension: Secondary | ICD-10-CM | POA: Diagnosis not present

## 2023-05-25 DIAGNOSIS — E876 Hypokalemia: Secondary | ICD-10-CM

## 2023-05-25 DIAGNOSIS — F439 Reaction to severe stress, unspecified: Secondary | ICD-10-CM

## 2023-05-25 DIAGNOSIS — Z7960 Long term (current) use of unspecified immunomodulators and immunosuppressants: Secondary | ICD-10-CM | POA: Diagnosis not present

## 2023-05-25 DIAGNOSIS — D649 Anemia, unspecified: Secondary | ICD-10-CM

## 2023-05-25 DIAGNOSIS — M069 Rheumatoid arthritis, unspecified: Secondary | ICD-10-CM | POA: Diagnosis not present

## 2023-05-25 DIAGNOSIS — D472 Monoclonal gammopathy: Secondary | ICD-10-CM

## 2023-05-25 LAB — POTASSIUM: Potassium: 3.8 meq/L (ref 3.5–5.1)

## 2023-05-25 MED ORDER — FLUTICASONE PROPIONATE 50 MCG/ACT NA SUSP: 2.0000 | Freq: Every day | NASAL | 1 refills | Status: AC

## 2023-05-25 MED ORDER — DOXYCYCLINE HYCLATE 100 MG PO TABS: 100.0000 mg | ORAL_TABLET | Freq: Two times a day (BID) | ORAL | 0 refills | Status: DC

## 2023-05-25 NOTE — Progress Notes (Signed)
Subjective:    Patient ID: Stacey Snyder, female    DOB: 23-Nov-1932, 89 y.o.   MRN: [id number]  Patient here for  Chief Complaint  Patient presents with   Medical Management of Chronic Issues    HPI Here for a scheduled follow up - follow up regarding hypercholesterolemia, RA and hypertension.  Admitted 02/09/23 - 02/10/23 - with lumbar stenosis with neurogenic claudication. Is status post L2-4 lumbar decompression. Seeing Dr Allena Katz for RA. Receiving remicade infusion - last 05/18/23.  Also received prednisone rx for right hand flare. She did not take the prednisone taper. Hands doing better. Will have rx if needed. Reports she is eating. No nausea or vomiting. No abdominal pain.  No bowel change reported. Takes 1/2 senokot - keeps her regular.  Does report a "boil" vaginal area. Persistent. Some persistent drainage. Wants to use flonase regularly and see if symptoms improve/resolve.    Past Medical History:  Diagnosis Date   Anemia    Diverticulosis    Fibrocystic breast disease    GERD (gastroesophageal reflux disease)    Glaucoma 12/04/2020   History of kidney stones    Hypercholesterolemia    Hypertension    Hypertension    IBS (irritable bowel syndrome)    Inflammatory arthritis    positive anti-CCP abs, s/p prednisone, MTX, Remicade   Meningioma (HCC)    Nephrolithiasis    Osteopenia    GI upset with Fosamax   Pancreatitis 2007   s/p ERCP   Pancreatitis    PONV (postoperative nausea and vomiting)    Renal insufficiency    Spinal stenosis of lumbosacral region    Past Surgical History:  Procedure Laterality Date   ABDOMINAL HYSTERECTOMY  1993   ovaries not removed   BREAST BIOPSY Left 1984   negative   BREAST CYST EXCISION Left 2010   negative   COLONOSCOPY N/A 12/05/2020   Procedure: COLONOSCOPY;  Surgeon: Regis Bill, MD;  Location: [facility];  Service: Endoscopy;  Laterality: N/A;   EYE SURGERY     cataract bilat   LITHOTRIPSY      LUMBAR LAMINECTOMY  1983   LUMBAR LAMINECTOMY/DECOMPRESSION MICRODISCECTOMY N/A 08/18/2016   Procedure: LUMBAR LAMINECTOMY/DECOMPRESSION MICRODISCECTOMY 1 LEVEL;  Surgeon: Keith Rake, MD;  Location: [facility] OR[facility];  Service: Neurosurgery;  Laterality: N/A;  L4-5 Laminectomy, MIS, L5 foraminotomy   LUMBAR LAMINECTOMY/DECOMPRESSION MICRODISCECTOMY Left 02/09/2023   Procedure: L2-4 POSTERIOR SPINAL DECOMPRESSION;  Surgeon: Venetia Night, MD;  Location: [facility] OR[facility];  Service: Neurosurgery;  Laterality: Left;   TRIGGER FINGER RELEASE     right ring finger   Family History  Problem Relation Age of Onset   Ovarian cancer Mother    Renal Disease Mother    Heart disease Mother    Diabetes Mother    Hypertension Mother    Heart disease Father    Breast cancer Neg Hx    Social History   Socioeconomic History   Marital status: Widowed    Spouse name: Not on file   Number of children: 4   Years of education: Not on file   Highest education level: Not on file  Occupational History   Not on file  Tobacco Use   Smoking status: Never   Smokeless tobacco: Never  Vaping Use   Vaping status: Never Used  Substance and Sexual Activity   Alcohol use: No    Alcohol/week: 0.0 standard drinks of alcohol   Drug use: No   Sexual activity: Not on file  Other Topics Concern   Not on file  Social History Narrative   Randie Heinz grandson is living with her   Social Drivers of Health   Financial Resource Strain: Not on file  Food Insecurity: No Food Insecurity (02/09/2023)   Hunger Vital Sign    Worried About Running Out of Food in the Last Year: Never true    Ran Out of Food in the Last Year: Never true  Transportation Needs: No Transportation Needs (02/09/2023)   PRAPARE - Administrator, Civil Service (Medical): No    Lack of Transportation (Non-Medical): No  Physical Activity: Not on file  Stress: Not on file  Social Connections: Not on file     Review of Systems  Constitutional:   Negative for appetite change and unexpected weight change.  HENT:  Positive for postnasal drip. Negative for sinus pressure.   Respiratory:  Negative for cough, chest tightness and shortness of breath.   Cardiovascular:  Negative for chest pain, palpitations and leg swelling.  Gastrointestinal:  Negative for abdominal pain, diarrhea, nausea and vomiting.  Genitourinary:  Negative for difficulty urinating and dysuria.  Musculoskeletal:  Negative for joint swelling and myalgias.  Skin:  Negative for color change and rash.       Boil - vaginal area.   Neurological:  Negative for dizziness and headaches.  Psychiatric/Behavioral:  Negative for agitation and dysphoric mood.        Objective:     BP 134/70   Pulse 65   Temp 98.2 F (36.8 C)   Resp 16   Ht 5\' 1"  (1.549 m)   Wt 97 lb (44 kg)   SpO2 98%   BMI 18.33 kg/m  Wt Readings from Last 3 Encounters:  05/25/23 97 lb (44 kg)  04/22/23 92 lb (41.7 kg)  03/24/23 92 lb (41.7 kg)    Physical Exam Vitals reviewed.  Constitutional:      General: She is not in acute distress.    Appearance: Normal appearance.  HENT:     Head: Normocephalic and atraumatic.     Right Ear: External ear normal.     Left Ear: External ear normal.     Mouth/Throat:     Pharynx: No oropharyngeal exudate or posterior oropharyngeal erythema.  Eyes:     General: No scleral icterus.       Right eye: No discharge.        Left eye: No discharge.     Conjunctiva/sclera: Conjunctivae normal.  Neck:     Thyroid: No thyromegaly.  Cardiovascular:     Rate and Rhythm: Normal rate and regular rhythm.  Pulmonary:     Effort: No respiratory distress.     Breath sounds: Normal breath sounds. No wheezing.  Abdominal:     General: Bowel sounds are normal.     Palpations: Abdomen is soft.     Tenderness: There is no abdominal tenderness.  Genitourinary:    Comments: Raised erythematous cyst/abscess - minimal tenderness to palpation.  Musculoskeletal:         General: No swelling or tenderness.     Cervical back: Neck supple. No tenderness.  Lymphadenopathy:     Cervical: No cervical adenopathy.  Skin:    Findings: No erythema or rash.  Neurological:     Mental Status: She is alert.  Psychiatric:        Mood and Affect: Mood normal.        Behavior: Behavior normal.  Outpatient Encounter Medications as of 05/25/2023  Medication Sig   doxycycline (VIBRA-TABS) 100 MG tablet Take 1 tablet (100 mg total) by mouth 2 (two) times daily.   fluticasone (FLONASE) 50 MCG/ACT nasal spray Place 2 sprays into both nostrils daily.   methotrexate (RHEUMATREX) 2.5 MG tablet Take 6 tablets by mouth once a week.   acetaminophen (TYLENOL) 500 MG tablet Take 1,000 mg by mouth in the morning and at bedtime.   Cholecalciferol (VITAMIN D) 2000 UNITS tablet Take 2,000 Units by mouth daily.   diltiazem (DILT-XR) 240 MG 24 hr capsule Take 1 capsule (240 mg total) by mouth daily.   dorzolamide-timolol (COSOPT) 22.3-6.8 MG/ML ophthalmic solution INSTILL 1 DROP INTO EACH EYE TWICE DAILY   gabapentin (NEURONTIN) 300 MG capsule Take 1 capsule (300 mg total) by mouth at bedtime.   InFLIXimab (REMICADE IV) Inject into the vein every 6 (six) weeks. Per Dr Gavin Potters   latanoprost (XALATAN) 0.005 % ophthalmic solution Place 1 drop into both eyes at bedtime.   lovastatin (MEVACOR) 40 MG tablet Take 1 tablet (40 mg total) by mouth daily.   omeprazole (PRILOSEC) 20 MG capsule Take 1 capsule (20 mg total) by mouth daily.   polyethylene glycol (MIRALAX / GLYCOLAX) 17 g packet Take 17 g by mouth daily.   prednisoLONE acetate (PRED FORTE) 1 % ophthalmic suspension Place 1 drop into both eyes daily. am   Probiotic Product (ALIGN) 4 MG CAPS Take one capsule daily   psyllium (METAMUCIL) 58.6 % packet Take 1 packet by mouth daily.   valsartan (DIOVAN) 160 MG tablet TAKE 1 TABLET(160 MG) BY MOUTH DAILY   [DISCONTINUED] methotrexate 25 MG/ML SOLN Inject 17.5 mg into the skin once  a week. Patient takes 17.5 mg (0.7 ml) on Monday or Tuesday of each week.   No facility-administered encounter medications on file as of 05/25/2023.     Lab Results  Component Value Date   WBC 7.9 05/18/2023   HGB 12.0 05/18/2023   HCT 36.3 05/18/2023   PLT 262.0 05/18/2023   GLUCOSE 107 (H) 05/18/2023   CHOL 180 05/18/2023   TRIG 278.0 (H) 05/18/2023   HDL 62.80 05/18/2023   LDLCALC 61 05/18/2023   ALT 12 05/18/2023   AST 22 05/18/2023   NA 136 05/18/2023   K 3.8 05/25/2023   CL 101 05/18/2023   CREATININE 0.83 05/18/2023   BUN 16 05/18/2023   CO2 29 05/18/2023   TSH 1.93 05/18/2023   INR 1.0 12/03/2020    CT ABDOMEN PELVIS W CONTRAST Result Date: 02/14/2023 CLINICAL DATA:  Diffuse lower abdominal pain with tenderness to palpation. No bowel movement for 5 days. Evaluate for fecal impaction or small bowel obstruction. EXAM: CT ABDOMEN AND PELVIS WITH CONTRAST TECHNIQUE: Multidetector CT imaging of the abdomen and pelvis was performed using the standard protocol following bolus administration of intravenous contrast. RADIATION DOSE REDUCTION: This exam was performed according to the departmental dose-optimization program which includes automated exposure control, adjustment of the mA and/or kV according to patient size and/or use of iterative reconstruction technique. CONTRAST:  OMNIPAQUE IOHEXOL 300 MG/ML  SOLN COMPARISON:  12/03/2020 FINDINGS: Lower chest: No acute abnormality. Hepatobiliary: No focal liver abnormality. The gallbladder appears within normal limits. No intrahepatic bile duct dilatation. CBD measures up to 7 mm proximally. Pancreas: Multiple cystic lesions are identified throughout the pancreas as noted on the previous exam. These have been present dating back to 03/05/2016 and exhibit mild gradual increase in size over time. Although etiology is  indeterminate findings are favored multiple benign cystic pancreatic lesions. The largest arises off the neck/body  measuring 2.3 cm. This is compared with 2.7 cm on the prior exam. The main duct is upper limits of normal measuring 3 mm. No inflammatory change. Spleen: Normal in size without focal abnormality. Adrenals/Urinary Tract: Normal adrenal glands. Upper pole left kidney calcification is likely vascular. No definite renal calculi or signs of obstructive uropathy. Several small cysts are noted the largest arises off the lower pole of left kidney measuring 0.9 cm. No follow-up imaging recommended. Right kidney myelolipoma is also noted measuring 5 mm and no follow-up imaging of this lesion is advised. Bladder appears normal. Stomach/Bowel: The stomach is nondistended. No pathologic dilatation of the small bowel loops to suggest a small bowel obstruction. No small bowel wall thickening or inflammatory changes. Terminal ileum appears normal. There is a moderate stool burden identified throughout the colon up to the level of the rectum. No signs of rectal impaction. No colonic wall thickening or pericolonic inflammatory fat stranding. Vascular/Lymphatic: Aortic atherosclerosis. No aneurysm. The upper abdominal vascularity is patent. Chronic appearing high-grade stenosis versus occlusion of the proximal SMA is identified with signs of distal reconstitution, image 15/2 and image 23/2. This is a new finding compared with the previous exam. No signs of abdominopelvic adenopathy. Reproductive: Status post hysterectomy.  No adnexal mass identified. Other: Trace fluid identified within the right posterior pelvis. No signs of pneumoperitoneum. Musculoskeletal: No acute or significant osseous findings. IMPRESSION: 1. No acute findings within the abdomen or pelvis. 2. Moderate stool burden identified throughout the colon up to the level of the rectum. No signs of rectal impaction. 3. Chronic appearing occlusion with absent opacification of the proximal SMA with distal reconstitution. This is a new finding compared with the previous  exam. No signs to suggest bowel ischemia identified. Specifically no abnormal wall thickening, inflammation, or pneumatosis. 4. Multiple cystic lesions are identified throughout the pancreas as noted on the previous exam. These have been present dating back to 03/05/2016 and exhibit mild gradual increase in size over time. Although etiology is indeterminate findings are favored multiple benign cystic pancreatic lesions. Consider follow-up MRI as clinically indicated. 5. Trace fluid identified within the right posterior pelvis. 6.  Aortic Atherosclerosis (ICD10-I70.0). Electronically Signed   By: Signa Kell M.D.   On: 02/14/2023 13:14       Assessment & Plan:  Hypercholesterolemia Assessment & Plan: Continue lovastatin.  Follow lipid panel and liver function tests.   Orders: -     Lipid panel; Future -     Hepatic function panel; Future  Anemia, unspecified type Assessment & Plan: Check cbc with next labs.   Orders: -     CBC with Differential/Platelet; Future -     IBC + Ferritin; Future  Essential hypertension Assessment & Plan: On diovan and diltiazem.   Blood pressures as outlined.  No changes. Follow metabolic panel.   Orders: -     Basic metabolic panel; Future -     TSH; Future  Hypokalemia -     Potassium  Aortic atherosclerosis (HCC) Assessment & Plan: Continue lovastatin.   Low back pain, unspecified back pain laterality, unspecified chronicity, unspecified whether sciatica present Assessment & Plan: S/p - 02/09/23 lumbar decompression L2-L4.  Had f/u with [facility] - 04/2023. Stable. Doing well.    [facility] Assessment & Plan: MRI Brain without Contrast 04/23/2021: 1.3 cm dural-based mass along the anterior falx, likely reflecting a meningioma. No significant mass effect  upon the adjacent frontal lobes. No adjacent parenchymal edema. Minimal chronic small-vessel ischemic changes within the cerebral white matter. Mild generalized parenchymal atrophy. Mild  paranasal sinus disease, as described.  Saw neurology (Dr Sherryll Burger) -  felt was an incidental finding, no signs and symptoms suggestive of pressure, vasogenic edema. did not feel she needed surveillance scans.      MGUS (monoclonal gammopathy of unknown significance) Assessment & Plan: Has declined f/u with hematology.  Myeloma panel (03/2021) - negative M spike.  Stable.     Rheumatoid arthritis, involving unspecified site, unspecified whether rheumatoid factor present [facility]) Assessment & Plan: Followed by rheumatology.  Has been receiving Remicade and MTX.    Stress Assessment & Plan: Does not feel needs any further intervention.  Follow.    Boil Assessment & Plan: Vaginal - boil - discussed applying warm compresses. Doxycycline as directed.  Follow.  Call with update.    Nasal drainage Assessment & Plan: Increased drainage. Flonase as directed.  Some persistent hoarseness. Will use flonase as directed. Discussed ENT referral, especially given persistent hoarseness.  Declines at this time.  Will notify me if changes her mind.    Hoarseness Assessment & Plan: Persistent hoarseness.  Discussed.  No sore throat.  Denies acid reflux.  States controlled on PPI.  Flonase nasal spray to help with drainage.  Call with update.  Discussed ENT referral. Declined.  Will notify me if changes her mind.    Other orders -     Doxycycline Hyclate; Take 1 tablet (100 mg total) by mouth 2 (two) times daily.  Dispense: 14 tablet; Refill: 0 -     Fluticasone Propionate; Place 2 sprays into both nostrils daily.  Dispense: 48 g; Refill: 1     Dale Blodgett, MD

## 2023-05-31 ENCOUNTER — Encounter: Payer: Self-pay | Admitting: Internal Medicine

## 2023-05-31 DIAGNOSIS — L0292 Furuncle, unspecified: Secondary | ICD-10-CM | POA: Insufficient documentation

## 2023-05-31 DIAGNOSIS — J3489 Other specified disorders of nose and nasal sinuses: Secondary | ICD-10-CM | POA: Insufficient documentation

## 2023-05-31 NOTE — Assessment & Plan Note (Signed)
Does not feel needs any further intervention.  Follow.

## 2023-05-31 NOTE — Assessment & Plan Note (Signed)
Has declined f/u with hematology.  Myeloma panel (03/2021) - negative M spike.  Stable.   ?

## 2023-05-31 NOTE — Assessment & Plan Note (Signed)
Check cbc with next labs.   ?

## 2023-05-31 NOTE — Assessment & Plan Note (Signed)
Followed by rheumatology.  Has been receiving Remicade and MTX.  ?

## 2023-05-31 NOTE — Assessment & Plan Note (Signed)
Vaginal - boil - discussed applying warm compresses. Doxycycline as directed.  Follow.  Call with update.

## 2023-05-31 NOTE — Assessment & Plan Note (Signed)
Persistent hoarseness.  Discussed.  No sore throat.  Denies acid reflux.  States controlled on PPI.  Flonase nasal spray to help with drainage.  Call with update.  Discussed ENT referral. Declined.  Will notify me if changes her mind.

## 2023-05-31 NOTE — Assessment & Plan Note (Signed)
Continue lovastatin.  Follow lipid panel and liver function tests.

## 2023-05-31 NOTE — Assessment & Plan Note (Signed)
Continue lovastatin

## 2023-05-31 NOTE — Assessment & Plan Note (Signed)
Increased drainage. Flonase as directed.  Some persistent hoarseness. Will use flonase as directed. Discussed ENT referral, especially given persistent hoarseness.  Declines at this time.  Will notify me if changes her mind.

## 2023-05-31 NOTE — Assessment & Plan Note (Signed)
On diovan and diltiazem.   Blood pressures as outlined.  No changes. Follow metabolic panel.

## 2023-05-31 NOTE — Assessment & Plan Note (Signed)
S/p - 02/09/23 lumbar decompression L2-L4.  Had f/u with [facility] - 04/2023. Stable. Doing well.

## 2023-05-31 NOTE — Assessment & Plan Note (Signed)
MRI Brain without Contrast 04/23/2021: 1.3 cm dural-based mass along the anterior falx, likely reflecting a meningioma. No significant mass effect upon the adjacent frontal lobes. No adjacent parenchymal edema. Minimal chronic small-vessel ischemic changes within the cerebral white matter. Mild generalized parenchymal atrophy. Mild paranasal sinus disease, as described.  Saw neurology (Dr Sherryll Burger) -  felt was an incidental finding, no signs and symptoms suggestive of pressure, vasogenic edema. did not feel she needed surveillance scans.

## 2023-06-01 ENCOUNTER — Telehealth: Payer: Self-pay

## 2023-06-01 NOTE — Telephone Encounter (Signed)
Copied from CRM [id number]. Topic: Clinical - Medical Advice >> Jun 01, 2023  9:35 AM Isabell A wrote: Reason for CRM: Patient was told to call back Dr.Scott after she finished her antibiotics - Patients wants to also let her know she's still having her problem, its getting better but still there (in regard to her cyst).

## 2023-06-02 NOTE — Telephone Encounter (Signed)
Tried to call patient.  Phone line busy

## 2023-06-03 ENCOUNTER — Other Ambulatory Visit: Payer: Self-pay

## 2023-06-03 MED ORDER — DOXYCYCLINE HYCLATE 100 MG PO TABS: 100.0000 mg | ORAL_TABLET | Freq: Two times a day (BID) | ORAL | 0 refills | Status: AC

## 2023-06-03 NOTE — Telephone Encounter (Signed)
Per Dr Lorin Picket, abx refilled for one more round. Pt will continue to take probiotic, eat yogurt and let me know if does not clear.

## 2023-06-24 ENCOUNTER — Other Ambulatory Visit: Payer: Self-pay | Admitting: Internal Medicine

## 2023-06-29 ENCOUNTER — Other Ambulatory Visit: Payer: Self-pay | Admitting: Internal Medicine

## 2023-07-06 DIAGNOSIS — M0579 Rheumatoid arthritis with rheumatoid factor of multiple sites without organ or systems involvement: Secondary | ICD-10-CM | POA: Diagnosis not present

## 2023-07-20 ENCOUNTER — Encounter: Payer: Self-pay | Admitting: Emergency Medicine

## 2023-07-20 ENCOUNTER — Ambulatory Visit
Admission: EM | Admit: 2023-07-20 | Discharge: 2023-07-20 | Disposition: A | Discharge status: Home / Self Care | Attending: Family Medicine | Admitting: Family Medicine

## 2023-07-20 DIAGNOSIS — N3001 Acute cystitis with hematuria: Secondary | ICD-10-CM | POA: Diagnosis not present

## 2023-07-20 LAB — URINALYSIS, W/ REFLEX TO CULTURE (INFECTION SUSPECTED)
Bilirubin Urine: NEGATIVE
Glucose, UA: NEGATIVE mg/dL
Ketones, ur: NEGATIVE mg/dL
Nitrite: NEGATIVE
Protein, ur: >300 mg/dL — AB
RBC / HPF: >50 RBC/hpf (ref 0–5)
Specific Gravity, Urine: 1.02 (ref 1.005–1.030)
WBC, UA: >50 WBC/hpf (ref 0–5)
pH: 7 (ref 5.0–8.0)

## 2023-07-20 MED ORDER — PHENAZOPYRIDINE HCL 200 MG PO TABS: 200.0000 mg | ORAL_TABLET | Freq: Three times a day (TID) | ORAL | 0 refills | Status: AC

## 2023-07-20 MED ORDER — NITROFURANTOIN MONOHYD MACRO 100 MG PO CAPS: 100.0000 mg | ORAL_CAPSULE | Freq: Two times a day (BID) | ORAL | 0 refills | Status: DC

## 2023-07-20 NOTE — Discharge Instructions (Signed)
 Stop by the pharmacy to pick up your prescriptions.  Follow up with your primary care provider or return to the urgent care, if not improving.

## 2023-07-20 NOTE — ED Triage Notes (Signed)
 Pt presents with dysuria, urinary frequency, and nausea since yesterday.

## 2023-07-20 NOTE — ED Provider Notes (Signed)
 [facility]    CSN: [id number] Arrival date & time: 07/20/23  0944      History   Chief Complaint Chief Complaint  Patient presents with   Dysuria     HPI HPI Stacey Snyder is a 89 y.o. female.    Stacey Snyder presents for urinary frequency with small amount of urine, chills, painful urination that started yesterday.  No fever. Has had some nausea and slight episode of vomiting. She has history of kidney stones. No vaginal discharge or bleeding.  No LMP recorded. Patient has had a hysterectomy.        Past Medical History:  Diagnosis Date   Anemia    Diverticulosis    Fibrocystic breast disease    GERD (gastroesophageal reflux disease)    Glaucoma 12/04/2020   History of kidney stones    Hypercholesterolemia    Hypertension    Hypertension    IBS (irritable bowel syndrome)    Inflammatory arthritis    positive anti-CCP abs, s/p prednisone, MTX, Remicade   Meningioma (HCC)    Nephrolithiasis    Osteopenia    GI upset with Fosamax   Pancreatitis 2007   s/p ERCP   Pancreatitis    PONV (postoperative nausea and vomiting)    Renal insufficiency    Spinal stenosis of lumbosacral region     Patient Active Problem List   Diagnosis Date Noted   Boil 05/31/2023   Nasal drainage 05/31/2023   Constipation 02/20/2023   Hypermagnesemia 02/20/2023   Lumbar stenosis with neurogenic claudication 02/09/2023   Pre-op evaluation 01/25/2023   Low back pain 12/26/2022   Hand numbness 10/11/2022   Hoarseness 05/27/2022   Right knee pain 08/11/2021   Meningioma (HCC) 08/11/2021   Arm lesion 06/22/2021   Headache 04/12/2021   Glaucoma 12/04/2020   Lower GI bleed 12/03/2020   Hypokalemia 12/03/2020   Pancreas cyst 09/12/2020   Aortic atherosclerosis (HCC) 07/30/2020   Diarrhea 07/10/2020   Right shoulder pain 02/19/2019   Rash 02/19/2019   MGUS (monoclonal gammopathy of unknown significance) 01/19/2018   Cramps, extremity  12/02/2017   Abdominal bruit 01/16/2017   Lumbar stenosis 08/18/2016   Abdominal pain 12/03/2015   Abnormal liver function tests 10/18/2015   Anemia 10/18/2015   Hyponatremia 10/07/2015   Lip lesion 04/23/2015   Scalp lesion 04/23/2015   Loss of weight 04/23/2015   Stress 04/23/2015   Left shoulder pain 12/20/2014   UTI (urinary tract infection) 08/20/2014   Health care maintenance 08/20/2014   Skin lesion 04/17/2014   Fatigue 04/13/2014   Shoulder pain, right 12/17/2013   Trigger finger 08/21/2013   Left hip pain 04/17/2013   Acid reflux 04/03/2012   Rheumatoid arthritis (HCC) 03/30/2012   Diverticulosis 03/30/2012   Osteopenia 03/30/2012   Essential hypertension 03/30/2012   Hypercholesterolemia 03/30/2012    Past Surgical History:  Procedure Laterality Date   ABDOMINAL HYSTERECTOMY  1993   ovaries not removed   BREAST BIOPSY Left 1984   negative   BREAST CYST EXCISION Left 2010   negative   COLONOSCOPY N/A 12/05/2020   Procedure: COLONOSCOPY;  Surgeon: Regis Bill, MD;  Location: [facility];  Service: Endoscopy;  Laterality: N/A;   EYE SURGERY     cataract bilat   LITHOTRIPSY     LUMBAR LAMINECTOMY  1983   LUMBAR LAMINECTOMY/DECOMPRESSION MICRODISCECTOMY N/A 08/18/2016   Procedure: LUMBAR LAMINECTOMY/DECOMPRESSION MICRODISCECTOMY 1 LEVEL;  Surgeon: Keith Rake, MD;  Location: [facility];  Service: Neurosurgery;  Laterality: N/A;  L4-5 Laminectomy, MIS, L5 foraminotomy   LUMBAR LAMINECTOMY/DECOMPRESSION MICRODISCECTOMY Left 02/09/2023   Procedure: L2-4 POSTERIOR SPINAL DECOMPRESSION;  Surgeon: Venetia Night, MD;  Location: [facility] ORS;  Service: Neurosurgery;  Laterality: Left;   TRIGGER FINGER RELEASE     right ring finger    OB History   No obstetric history on file.      Home Medications    Prior to Admission medications   Medication Sig Start Date End Date Taking? Authorizing Provider  nitrofurantoin, macrocrystal-monohydrate, (MACROBID) 100  MG capsule Take 1 capsule (100 mg total) by mouth 2 (two) times daily. 07/20/23  Yes Alynna Hargrove, DO  phenazopyridine (PYRIDIUM) 200 MG tablet Take 1 tablet (200 mg total) by mouth 3 (three) times daily. 07/20/23  Yes Millan Legan, Seward Meth, DO  acetaminophen (TYLENOL) 500 MG tablet Take 1,000 mg by mouth in the morning and at bedtime.    [provider]  Cholecalciferol (VITAMIN D) 2000 UNITS tablet Take 2,000 Units by mouth daily.    [provider]  diltiazem (DILT-XR) 240 MG 24 hr capsule Take 1 capsule (240 mg total) by mouth daily. 07/22/22   Dale [place], MD  dorzolamide-timolol (COSOPT) 22.3-6.8 MG/ML ophthalmic solution INSTILL 1 DROP INTO EACH EYE TWICE DAILY 12/17/17   [provider]  doxycycline (VIBRA-TABS) 100 MG tablet Take 1 tablet (100 mg total) by mouth 2 (two) times daily. 06/03/23   Dale Chenoweth, MD  fluticasone (FLONASE) 50 MCG/ACT nasal spray Place 2 sprays into both nostrils daily. 05/25/23   Dale [place], MD  gabapentin (NEURONTIN) 300 MG capsule Take 1 capsule (300 mg total) by mouth at bedtime. 07/22/22   Dale Chapman, MD  InFLIXimab (REMICADE IV) Inject into the vein every 6 (six) weeks. Per Dr Gavin Potters    [provider]  latanoprost (XALATAN) 0.005 % ophthalmic solution Place 1 drop into both eyes at bedtime. 01/18/18   [provider]  lovastatin (MEVACOR) 40 MG tablet Take 1 tablet (40 mg total) by mouth daily. 07/22/22   Dale [place]more, MD  methotrexate (RHEUMATREX) 2.5 MG tablet Take 6 tablets by mouth once a week. 04/08/23   [provider]  omeprazole (PRILOSEC) 20 MG capsule TAKE 1 CAPSULE DAILY 06/24/23   Dale [place], MD  polyethylene glycol (MIRALAX / GLYCOLAX) 17 g packet Take 17 g by mouth daily.    [provider]  prednisoLONE acetate (PRED FORTE) 1 % ophthalmic suspension Place 1 drop into both eyes daily. am    [provider]  Probiotic Product (ALIGN) 4 MG CAPS Take one capsule  daily 03/11/19   Dale [place], MD  psyllium (METAMUCIL) 58.6 % packet Take 1 packet by mouth daily.    [provider]  valsartan (DIOVAN) 160 MG tablet TAKE 1 TABLET DAILY 06/29/23   Dale , MD    Family History Family History  Problem Relation Age of Onset   Ovarian cancer Mother    Renal Disease Mother    Heart disease Mother    Diabetes Mother    Hypertension Mother    Heart disease Father    Breast cancer Neg Hx     Social History Social History   Tobacco Use   Smoking status: Never   Smokeless tobacco: Never  Vaping Use   Vaping status: Never Used  Substance Use Topics   Alcohol use: No    Alcohol/week: 0.0 standard drinks of alcohol   Drug use: No     Allergies   Carisoprodol, Metronidazole, Codeine,  Nsaids, Skelaxin [metaxalone], Tenormin [atenolol], and Tramadol   Review of Systems Review of Systems: :negative unless otherwise stated in HPI.      Physical Exam Triage Vital Signs ED Triage Vitals  Encounter Vitals Group     BP 07/20/23 1204 (!) 184/83     Systolic BP Percentile --      Diastolic BP Percentile --      Pulse Rate 07/20/23 1204 64     Resp 07/20/23 1204 16     Temp 07/20/23 1204 98.7 F (37.1 C)     Temp Source 07/20/23 1204 Oral     SpO2 07/20/23 1204 98 %     Weight --      Height --      Head Circumference --      Peak Flow --      Pain Score 07/20/23 1202 4     Pain Loc --      Pain Education --      Exclude from Growth Chart --    No data found.  Updated Vital Signs BP (!) 184/83 (BP Location: Right Arm)   Pulse 64   Temp 98.7 F (37.1 C) (Oral)   Resp 16   SpO2 98%   Visual Acuity Right Eye Distance:   Left Eye Distance:   Bilateral Distance:    Right Eye Near:   Left Eye Near:    Bilateral Near:     Physical Exam GEN: well appearing female in no acute distress  CVS: well perfused  RESP: speaking in full sentences without pause     UC Treatments / Results  Labs (all labs ordered  are listed, but only abnormal results are displayed) Labs Reviewed  URINALYSIS, W/ REFLEX TO CULTURE (INFECTION SUSPECTED) - Abnormal; Notable for the following components:      Result Value   Color, Urine AMBER (*)    APPearance HAZY (*)    Hgb urine dipstick LARGE (*)    Protein, ur >300 (*)    Leukocytes,Ua SMALL (*)    Non Squamous Epithelial PRESENT (*)    Bacteria, UA FEW (*)    All other components within normal limits  URINE CULTURE    EKG   Radiology No results found.  Procedures Procedures (including critical care time)  Medications Ordered in UC Medications - No data to display  Initial Impression / Assessment and Plan / UC Course  I have reviewed the triage vital signs and the nursing notes.  Pertinent labs & imaging results that were available during my care of the patient were reviewed by me and considered in my medical decision making (see chart for details).      Acute cystitis:  Patient is a 89 y.o. female  who presents for 1-2 days of dysuria and urinary frequency.  Overall patient is well-appearing and afebrile.  Vital signs stable.  UA consistent with acute cystitis.  Hematuria supported on microscopy.  Patient has no  UTI's in the past year. Last UTI was Jan 2024 treated with Macrobid.  Treat with Macrobid 2 times daily for 5 days.  Pyridium for discomfort. Urine culture obtained.  Follow-up sensitivities and change antibiotics, if needed.   Return precautions including abdominal pain, fever, chills, nausea, or vomiting given. Follow-up,  if symptoms not improving or getting worse. Discussed MDM, treatment plan and plan for follow-up with patient who agrees with plan.        Final Clinical Impressions(s) / UC Diagnoses   Final diagnoses:  Acute cystitis with hematuria     Discharge Instructions      Stop by the pharmacy to pick up your prescriptions.  Follow up with your primary care provider or return to the urgent care, if not improving.        ED Prescriptions     Medication Sig Dispense Auth. Provider   nitrofurantoin, macrocrystal-monohydrate, (MACROBID) 100 MG capsule Take 1 capsule (100 mg total) by mouth 2 (two) times daily. 10 capsule Charee Tumblin, DO   phenazopyridine (PYRIDIUM) 200 MG tablet Take 1 tablet (200 mg total) by mouth 3 (three) times daily. 6 tablet Katha Cabal, DO      PDMP not reviewed this encounter.   Katha Cabal, DO 07/20/23 1221

## 2023-07-22 LAB — URINE CULTURE: Culture: 40000 — AB

## 2023-08-03 ENCOUNTER — Other Ambulatory Visit: Payer: Self-pay | Admitting: Internal Medicine

## 2023-08-04 ENCOUNTER — Other Ambulatory Visit: Payer: Self-pay | Admitting: Internal Medicine

## 2023-08-31 DIAGNOSIS — M0579 Rheumatoid arthritis with rheumatoid factor of multiple sites without organ or systems involvement: Secondary | ICD-10-CM | POA: Diagnosis not present

## 2023-09-24 ENCOUNTER — Ambulatory Visit (INDEPENDENT_AMBULATORY_CARE_PROVIDER_SITE_OTHER): Payer: Medicare Other | Admitting: Internal Medicine

## 2023-09-24 VITALS — BP 122/70 | HR 61 | Temp 97.9°F | Resp 16 | Ht 61.0 in | Wt 96.0 lb

## 2023-09-24 DIAGNOSIS — M069 Rheumatoid arthritis, unspecified: Secondary | ICD-10-CM | POA: Diagnosis not present

## 2023-09-24 DIAGNOSIS — D649 Anemia, unspecified: Secondary | ICD-10-CM

## 2023-09-24 DIAGNOSIS — I10 Essential (primary) hypertension: Secondary | ICD-10-CM | POA: Diagnosis not present

## 2023-09-24 DIAGNOSIS — D329 Benign neoplasm of meninges, unspecified: Secondary | ICD-10-CM | POA: Diagnosis not present

## 2023-09-24 DIAGNOSIS — K219 Gastro-esophageal reflux disease without esophagitis: Secondary | ICD-10-CM | POA: Diagnosis not present

## 2023-09-24 DIAGNOSIS — E7800 Pure hypercholesterolemia, unspecified: Secondary | ICD-10-CM | POA: Diagnosis not present

## 2023-09-24 DIAGNOSIS — R5383 Other fatigue: Secondary | ICD-10-CM

## 2023-09-24 DIAGNOSIS — I700 Atherosclerosis of aorta: Secondary | ICD-10-CM

## 2023-09-24 LAB — CBC WITH DIFFERENTIAL/PLATELET
Basophils Absolute: 0 10*3/uL (ref 0.0–0.1)
Basophils Relative: 0.8 % (ref 0.0–3.0)
Eosinophils Absolute: 0.1 10*3/uL (ref 0.0–0.7)
Eosinophils Relative: 2.2 % (ref 0.0–5.0)
HCT: 35.7 % — ABNORMAL LOW (ref 36.0–46.0)
Hemoglobin: 11.8 g/dL — ABNORMAL LOW (ref 12.0–15.0)
Lymphocytes Relative: 39.4 % (ref 12.0–46.0)
Lymphs Abs: 2.1 10*3/uL (ref 0.7–4.0)
MCHC: 33.1 g/dL (ref 30.0–36.0)
MCV: 95.5 fl (ref 78.0–100.0)
Monocytes Absolute: 0.4 10*3/uL (ref 0.1–1.0)
Monocytes Relative: 7.6 % (ref 3.0–12.0)
Neutro Abs: 2.7 10*3/uL (ref 1.4–7.7)
Neutrophils Relative %: 50 % (ref 43.0–77.0)
Platelets: 234 10*3/uL (ref 150.0–400.0)
RBC: 3.74 Mil/uL — ABNORMAL LOW (ref 3.87–5.11)
RDW: 13.9 % (ref 11.5–15.5)
WBC: 5.4 10*3/uL (ref 4.0–10.5)

## 2023-09-24 LAB — IBC + FERRITIN
Ferritin: 193.6 ng/mL (ref 10.0–291.0)
Iron: 67 ug/dL (ref 42–145)
Saturation Ratios: 21.9 % (ref 20.0–50.0)
TIBC: 306.6 ug/dL (ref 250.0–450.0)
Transferrin: 219 mg/dL (ref 212.0–360.0)

## 2023-09-24 LAB — LIPID PANEL
Cholesterol: 171 mg/dL (ref 0–200)
HDL: 75.9 mg/dL (ref >39.00)
LDL Cholesterol: 77 mg/dL (ref 0–99)
NonHDL: 94.67
Total CHOL/HDL Ratio: 2
Triglycerides: 87 mg/dL (ref 0.0–149.0)
VLDL: 17.4 mg/dL (ref 0.0–40.0)

## 2023-09-24 LAB — BASIC METABOLIC PANEL WITH GFR
BUN: 19 mg/dL (ref 6–23)
CO2: 30 meq/L (ref 19–32)
Calcium: 9.4 mg/dL (ref 8.4–10.5)
Chloride: 102 meq/L (ref 96–112)
Creatinine, Ser: 0.89 mg/dL (ref 0.40–1.20)
GFR: 57.87 mL/min — ABNORMAL LOW (ref >60.00)
Glucose, Bld: 94 mg/dL (ref 70–99)
Potassium: 3.6 meq/L (ref 3.5–5.1)
Sodium: 137 meq/L (ref 135–145)

## 2023-09-24 LAB — HEPATIC FUNCTION PANEL
ALT: 14 U/L (ref 0–35)
AST: 22 U/L (ref 0–37)
Albumin: 4.3 g/dL (ref 3.5–5.2)
Alkaline Phosphatase: 87 U/L (ref 39–117)
Bilirubin, Direct: 0.1 mg/dL (ref 0.0–0.3)
Total Bilirubin: 0.4 mg/dL (ref 0.2–1.2)
Total Protein: 7.3 g/dL (ref 6.0–8.3)

## 2023-09-24 LAB — VITAMIN B12: Vitamin B-12: 581 pg/mL (ref 211–911)

## 2023-09-24 LAB — TSH: TSH: 2.37 u[IU]/mL (ref 0.35–5.50)

## 2023-09-24 NOTE — Progress Notes (Unsigned)
 Subjective:    Patient ID: Stacey Snyder, female    DOB: 1932/12/09, 89 y.o.   MRN: [id number]  Patient here for  Chief Complaint  Patient presents with   Annual Exam    HPI Here for a physical exam.  Admitted 02/09/23 - 02/10/23 - with lumbar stenosis with neurogenic claudication. Is status post L2-4 lumbar decompression. Seeing Dr Lydia Sams for RA. Receiving remicade  infusion. Reports some increased fatigue. Eating. No nausea or vomiting. Keeping bowels moving - taking miralax , 1/2 senna, metamucil and if needed MOM. No chest pain or sob reported.    Past Medical History:  Diagnosis Date   Anemia    Diverticulosis    Fibrocystic breast disease    GERD (gastroesophageal reflux disease)    Glaucoma 12/04/2020   History of kidney stones    Hypercholesterolemia    Hypertension    Hypertension    IBS (irritable bowel syndrome)    Inflammatory arthritis    positive anti-CCP abs, s/p prednisone, MTX, Remicade    Meningioma (HCC)    Nephrolithiasis    Osteopenia    GI upset with Fosamax   Pancreatitis 2007   s/p ERCP   Pancreatitis    PONV (postoperative nausea and vomiting)    Renal insufficiency    Spinal stenosis of lumbosacral region    Past Surgical History:  Procedure Laterality Date   ABDOMINAL HYSTERECTOMY  1993   ovaries not removed   BREAST BIOPSY Left 1984   negative   BREAST CYST EXCISION Left 2010   negative   COLONOSCOPY N/A 12/05/2020   Procedure: COLONOSCOPY;  Surgeon: Shane Darling, MD;  Location: [facility];  Service: Endoscopy;  Laterality: N/A;   EYE SURGERY     cataract bilat   LITHOTRIPSY     LUMBAR LAMINECTOMY  1983   LUMBAR LAMINECTOMY/DECOMPRESSION MICRODISCECTOMY N/A 08/18/2016   Procedure: LUMBAR LAMINECTOMY/DECOMPRESSION MICRODISCECTOMY 1 LEVEL;  Surgeon: Corena Devon, MD;  Location: [facility];  Service: Neurosurgery;  Laterality: N/A;  L4-5 Laminectomy, MIS, L5 foraminotomy   LUMBAR LAMINECTOMY/DECOMPRESSION MICRODISCECTOMY  Left 02/09/2023   Procedure: L2-4 POSTERIOR SPINAL DECOMPRESSION;  Surgeon: Jodeen Munch, MD;  Location: [facility];  Service: Neurosurgery;  Laterality: Left;   TRIGGER FINGER RELEASE     right ring finger   Family History  Problem Relation Age of Onset   Ovarian cancer Mother    Renal Disease Mother    Heart disease Mother    Diabetes Mother    Hypertension Mother    Heart disease Father    Breast cancer Neg Hx    Social History   Socioeconomic History   Marital status: Widowed    Spouse name: Not on file   Number of children: 4   Years of education: Not on file   Highest education level: Not on file  Occupational History   Not on file  Tobacco Use   Smoking status: Never   Smokeless tobacco: Never  Vaping Use   Vaping status: Never Used  Substance and Sexual Activity   Alcohol use: No    Alcohol/week: 0.0 standard drinks of alcohol   Drug use: No   Sexual activity: Not on file  Other Topics Concern   Not on file  Social History Narrative   Suella Emmer grandson is living with her   Social Drivers of Health   Financial Resource Strain: Not on file  Food Insecurity: No Food Insecurity (02/09/2023)   Hunger Vital Sign    Worried About Running Out of  Food in the Last Year: Never true    Ran Out of Food in the Last Year: Never true  Transportation Needs: No Transportation Needs (02/09/2023)   PRAPARE - Administrator, Civil Service (Medical): No    Lack of Transportation (Non-Medical): No  Physical Activity: Not on file  Stress: Not on file  Social Connections: Not on file     Review of Systems  Constitutional:  Positive for fatigue. Negative for appetite change.  HENT:  Negative for congestion, sinus pressure and sore throat.   Eyes:  Negative for pain and visual disturbance.  Respiratory:  Negative for cough, chest tightness and shortness of breath.   Cardiovascular:  Negative for chest pain, palpitations and leg swelling.  Gastrointestinal:   Negative for abdominal pain, diarrhea, nausea and vomiting.  Genitourinary:  Negative for difficulty urinating and dysuria.  Musculoskeletal:  Negative for joint swelling and myalgias.  Skin:  Negative for color change and rash.  Neurological:  Negative for dizziness and headaches.  Hematological:  Negative for adenopathy. Does not bruise/bleed easily.  Psychiatric/Behavioral:  Negative for agitation and dysphoric mood.        Objective:     BP 122/70   Pulse 61   Temp 97.9 F (36.6 C)   Resp 16   Ht 5\' 1"  (1.549 m)   Wt 96 lb (43.5 kg)   SpO2 98%   BMI 18.14 kg/m  Wt Readings from Last 3 Encounters:  09/24/23 96 lb (43.5 kg)  05/25/23 97 lb (44 kg)  04/22/23 92 lb (41.7 kg)    Physical Exam Vitals reviewed.  Constitutional:      General: She is not in acute distress.    Appearance: Normal appearance.  HENT:     Head: Normocephalic and atraumatic.     Right Ear: External ear normal.     Left Ear: External ear normal.     Mouth/Throat:     Pharynx: No oropharyngeal exudate or posterior oropharyngeal erythema.  Eyes:     General: No scleral icterus.       Right eye: No discharge.        Left eye: No discharge.     Conjunctiva/sclera: Conjunctivae normal.  Neck:     Thyroid : No thyromegaly.  Cardiovascular:     Rate and Rhythm: Normal rate and regular rhythm.  Pulmonary:     Effort: No respiratory distress.     Breath sounds: Normal breath sounds. No wheezing.  Abdominal:     General: Bowel sounds are normal.     Palpations: Abdomen is soft.     Tenderness: There is no abdominal tenderness.  Musculoskeletal:        General: No swelling or tenderness.     Cervical back: Neck supple. No tenderness.  Lymphadenopathy:     Cervical: No cervical adenopathy.  Skin:    Findings: No erythema or rash.  Neurological:     Mental Status: She is alert.  Psychiatric:        Mood and Affect: Mood normal.        Behavior: Behavior normal.         Outpatient  Encounter Medications as of 09/24/2023  Medication Sig   acetaminophen  (TYLENOL ) 500 MG tablet Take 1,000 mg by mouth in the morning and at bedtime.   Cholecalciferol  (VITAMIN D ) 2000 UNITS tablet Take 2,000 Units by mouth daily.   diltiazem  (DILACOR XR ) 240 MG 24 hr capsule TAKE 1 CAPSULE DAILY   dorzolamide -timolol  (COSOPT )  22.3-6.8 MG/ML ophthalmic solution INSTILL 1 DROP INTO EACH EYE TWICE DAILY   doxycycline  (VIBRA -TABS) 100 MG tablet Take 1 tablet (100 mg total) by mouth 2 (two) times daily.   fluticasone  (FLONASE ) 50 MCG/ACT nasal spray Place 2 sprays into both nostrils daily.   gabapentin  (NEURONTIN ) 300 MG capsule TAKE 1 CAPSULE AT BEDTIME   InFLIXimab  (REMICADE  IV) Inject into the vein every 6 (six) weeks. Per Dr Kernodle   latanoprost  (XALATAN ) 0.005 % ophthalmic solution Place 1 drop into both eyes at bedtime.   lovastatin  (MEVACOR ) 40 MG tablet TAKE 1 TABLET DAILY   methotrexate (RHEUMATREX) 2.5 MG tablet Take 6 tablets by mouth once a week.   omeprazole  (PRILOSEC) 20 MG capsule TAKE 1 CAPSULE DAILY   phenazopyridine  (PYRIDIUM ) 200 MG tablet Take 1 tablet (200 mg total) by mouth 3 (three) times daily.   polyethylene glycol (MIRALAX  / GLYCOLAX ) 17 g packet Take 17 g by mouth daily.   prednisoLONE  acetate (PRED FORTE ) 1 % ophthalmic suspension Place 1 drop into both eyes daily. am   Probiotic Product (ALIGN) 4 MG CAPS Take one capsule daily   psyllium (METAMUCIL) 58.6 % packet Take 1 packet by mouth daily.   valsartan  (DIOVAN ) 160 MG tablet TAKE 1 TABLET DAILY   [DISCONTINUED] nitrofurantoin , macrocrystal-monohydrate, (MACROBID ) 100 MG capsule Take 1 capsule (100 mg total) by mouth 2 (two) times daily.   No facility-administered encounter medications on file as of 09/24/2023.     Lab Results  Component Value Date   WBC 5.4 09/24/2023   HGB 11.8 (L) 09/24/2023   HCT 35.7 (L) 09/24/2023   PLT 234.0 09/24/2023   GLUCOSE 94 09/24/2023   CHOL 171 09/24/2023   TRIG 87.0  09/24/2023   HDL 75.90 09/24/2023   LDLCALC 77 09/24/2023   ALT 14 09/24/2023   AST 22 09/24/2023   NA 137 09/24/2023   K 3.6 09/24/2023   CL 102 09/24/2023   CREATININE 0.89 09/24/2023   BUN 19 09/24/2023   CO2 30 09/24/2023   TSH 2.37 09/24/2023   INR 1.0 12/03/2020       Assessment & Plan:  Aortic atherosclerosis (HCC) Assessment & Plan: Continue lovastatin .    Hypercholesterolemia Assessment & Plan: Continue lovastatin .  Follow lipid panel.   Orders: -     Lipid panel -     Hepatic function panel  Essential hypertension Assessment & Plan: On diovan  and diltiazem .   Blood pressures as outlined. Continue current medication regimen. Follow pressures. Follow metabolic panel.   Orders: -     Basic metabolic panel with GFR -     TSH  Anemia, unspecified type Assessment & Plan: Check cbc.   Orders: -     CBC with Differential/Platelet -     IBC + Ferritin -     Vitamin B12  Gastroesophageal reflux disease without esophagitis Assessment & Plan: No upper symptoms reported. Continue prilosec.    Meningioma [facility]) Assessment & Plan: MRI Brain without Contrast 04/23/2021: 1.3 cm dural-based mass along the anterior falx, likely reflecting a meningioma. No significant mass effect upon the adjacent frontal lobes. No adjacent parenchymal edema. Minimal chronic small-vessel ischemic changes within the cerebral white matter. Mild generalized parenchymal atrophy. Mild paranasal sinus disease, as described.  Saw neurology (Dr Mason Sole) -  felt was an incidental finding, no signs and symptoms suggestive of pressure, vasogenic edema. did not feel she needed surveillance scans.      Rheumatoid arthritis, involving unspecified site, unspecified whether rheumatoid factor present (  HCC) Assessment & Plan: Followed by rheumatology.  Has been receiving Remicade  and MTX. Stable.    Other fatigue Assessment & Plan: Reports increased fatigue. Discussed further w/up. Agreeable to  labs. Check cbc, iron studies, B12, metabolic panel and tsh.       Dellar Fenton, MD

## 2023-09-28 NOTE — Assessment & Plan Note (Signed)
 Continue lovastatin .  Follow lipid panel.

## 2023-09-28 NOTE — Assessment & Plan Note (Signed)
 On diovan  and diltiazem .   Blood pressures as outlined. Continue current medication regimen. Follow pressures. Follow metabolic panel.

## 2023-09-28 NOTE — Assessment & Plan Note (Signed)
 Followed by rheumatology.  Has been receiving Remicade  and MTX. Stable.

## 2023-09-28 NOTE — Assessment & Plan Note (Signed)
 Check cbc

## 2023-09-28 NOTE — Assessment & Plan Note (Signed)
No upper symptoms reported.  Continue prilosec.  

## 2023-09-28 NOTE — Assessment & Plan Note (Signed)
 Continue lovastatin

## 2023-09-28 NOTE — Assessment & Plan Note (Signed)
 MRI Brain without Contrast 04/23/2021: 1.3 cm dural-based mass along the anterior falx, likely reflecting a meningioma. No significant mass effect upon the adjacent frontal lobes. No adjacent parenchymal edema. Minimal chronic small-vessel ischemic changes within the cerebral white matter. Mild generalized parenchymal atrophy. Mild paranasal sinus disease, as described.  Saw neurology (Dr Sherryll Burger) -  felt was an incidental finding, no signs and symptoms suggestive of pressure, vasogenic edema. did not feel she needed surveillance scans.

## 2023-09-28 NOTE — Assessment & Plan Note (Signed)
 Reports increased fatigue. Discussed further w/up. Agreeable to labs. Check cbc, iron studies, B12, metabolic panel and tsh.

## 2023-10-01 ENCOUNTER — Ambulatory Visit: Payer: Self-pay | Admitting: Internal Medicine

## 2023-10-01 DIAGNOSIS — N289 Disorder of kidney and ureter, unspecified: Secondary | ICD-10-CM

## 2023-10-07 DIAGNOSIS — H43813 Vitreous degeneration, bilateral: Secondary | ICD-10-CM | POA: Diagnosis not present

## 2023-10-07 DIAGNOSIS — H353131 Nonexudative age-related macular degeneration, bilateral, early dry stage: Secondary | ICD-10-CM | POA: Diagnosis not present

## 2023-10-07 DIAGNOSIS — Z961 Presence of intraocular lens: Secondary | ICD-10-CM | POA: Diagnosis not present

## 2023-10-07 DIAGNOSIS — H4043X4 Glaucoma secondary to eye inflammation, bilateral, indeterminate stage: Secondary | ICD-10-CM | POA: Diagnosis not present

## 2023-10-27 ENCOUNTER — Other Ambulatory Visit: Payer: Self-pay | Admitting: Internal Medicine

## 2023-11-02 ENCOUNTER — Other Ambulatory Visit (INDEPENDENT_AMBULATORY_CARE_PROVIDER_SITE_OTHER)

## 2023-11-02 DIAGNOSIS — N289 Disorder of kidney and ureter, unspecified: Secondary | ICD-10-CM | POA: Diagnosis not present

## 2023-11-02 DIAGNOSIS — M0579 Rheumatoid arthritis with rheumatoid factor of multiple sites without organ or systems involvement: Secondary | ICD-10-CM | POA: Diagnosis not present

## 2023-11-02 LAB — BASIC METABOLIC PANEL WITH GFR
BUN: 20 mg/dL (ref 6–23)
CO2: 29 meq/L (ref 19–32)
Calcium: 9.5 mg/dL (ref 8.4–10.5)
Chloride: 99 meq/L (ref 96–112)
Creatinine, Ser: 1.01 mg/dL (ref 0.40–1.20)
GFR: 49.68 mL/min — ABNORMAL LOW (ref >60.00)
Glucose, Bld: 83 mg/dL (ref 70–99)
Potassium: 3.6 meq/L (ref 3.5–5.1)
Sodium: 136 meq/L (ref 135–145)

## 2023-11-04 ENCOUNTER — Ambulatory Visit: Payer: Self-pay | Admitting: Internal Medicine

## 2023-11-04 DIAGNOSIS — I10 Essential (primary) hypertension: Secondary | ICD-10-CM

## 2023-11-04 DIAGNOSIS — N289 Disorder of kidney and ureter, unspecified: Secondary | ICD-10-CM

## 2023-11-16 ENCOUNTER — Other Ambulatory Visit (INDEPENDENT_AMBULATORY_CARE_PROVIDER_SITE_OTHER)

## 2023-11-16 DIAGNOSIS — I10 Essential (primary) hypertension: Secondary | ICD-10-CM | POA: Diagnosis not present

## 2023-11-16 DIAGNOSIS — N289 Disorder of kidney and ureter, unspecified: Secondary | ICD-10-CM | POA: Diagnosis not present

## 2023-11-16 LAB — URINALYSIS, ROUTINE W REFLEX MICROSCOPIC
Bilirubin Urine: NEGATIVE
Hgb urine dipstick: NEGATIVE
Ketones, ur: NEGATIVE
Leukocytes,Ua: NEGATIVE
Nitrite: NEGATIVE
Specific Gravity, Urine: 1.01 (ref 1.000–1.030)
Urine Glucose: NEGATIVE
Urobilinogen, UA: 0.2 (ref 0.0–1.0)
pH: 6.5 (ref 5.0–8.0)

## 2023-11-16 LAB — BASIC METABOLIC PANEL WITH GFR
BUN: 21 mg/dL (ref 6–23)
CO2: 28 meq/L (ref 19–32)
Calcium: 9.7 mg/dL (ref 8.4–10.5)
Chloride: 101 meq/L (ref 96–112)
Creatinine, Ser: 1.06 mg/dL (ref 0.40–1.20)
GFR: 46.87 mL/min — ABNORMAL LOW (ref >60.00)
Glucose, Bld: 112 mg/dL — ABNORMAL HIGH (ref 70–99)
Potassium: 4 meq/L (ref 3.5–5.1)
Sodium: 137 meq/L (ref 135–145)

## 2023-11-17 ENCOUNTER — Ambulatory Visit: Payer: Self-pay | Admitting: Internal Medicine

## 2023-11-18 ENCOUNTER — Other Ambulatory Visit: Payer: Self-pay | Admitting: Internal Medicine

## 2023-11-18 DIAGNOSIS — R319 Hematuria, unspecified: Secondary | ICD-10-CM

## 2023-11-18 NOTE — Progress Notes (Signed)
 Order placed for urology referral.

## 2023-12-11 DIAGNOSIS — M0579 Rheumatoid arthritis with rheumatoid factor of multiple sites without organ or systems involvement: Secondary | ICD-10-CM | POA: Diagnosis not present

## 2023-12-11 DIAGNOSIS — Z7960 Long term (current) use of unspecified immunomodulators and immunosuppressants: Secondary | ICD-10-CM | POA: Diagnosis not present

## 2023-12-11 DIAGNOSIS — M170 Bilateral primary osteoarthritis of knee: Secondary | ICD-10-CM | POA: Diagnosis not present

## 2023-12-18 ENCOUNTER — Encounter: Payer: Self-pay | Admitting: Emergency Medicine

## 2023-12-18 ENCOUNTER — Ambulatory Visit
Admission: EM | Admit: 2023-12-18 | Discharge: 2023-12-18 | Disposition: A | Discharge status: Home / Self Care | Attending: Physician Assistant | Admitting: Physician Assistant

## 2023-12-18 DIAGNOSIS — I10 Essential (primary) hypertension: Secondary | ICD-10-CM | POA: Diagnosis not present

## 2023-12-18 DIAGNOSIS — N289 Disorder of kidney and ureter, unspecified: Secondary | ICD-10-CM | POA: Insufficient documentation

## 2023-12-18 DIAGNOSIS — N3000 Acute cystitis without hematuria: Secondary | ICD-10-CM | POA: Insufficient documentation

## 2023-12-18 LAB — URINALYSIS, W/ REFLEX TO CULTURE (INFECTION SUSPECTED)
Bilirubin Urine: NEGATIVE
Glucose, UA: NEGATIVE mg/dL
Ketones, ur: NEGATIVE mg/dL
Leukocytes,Ua: NEGATIVE
Nitrite: NEGATIVE
Protein, ur: NEGATIVE mg/dL
Specific Gravity, Urine: 1.015 (ref 1.005–1.030)
pH: 7 (ref 5.0–8.0)

## 2023-12-18 MED ORDER — CEFDINIR 300 MG PO CAPS: 300.0000 mg | ORAL_CAPSULE | Freq: Every day | ORAL | 0 refills | Status: AC

## 2023-12-18 NOTE — Discharge Instructions (Addendum)
 UTI: Based on either symptoms or urinalysis, you may have a urinary tract infection. We will send the urine for culture and call with results in a few days. Begin antibiotics at this time. Your symptoms should be much improved over the next 2-3 days. Increase rest and fluid intake. If for some reason symptoms are worsening or not improving after a couple of days or the urine culture determines the antibiotics you are taking will not treat the infection, the antibiotics may be changed. Return or go to ER for fever, back pain, worsening urinary pain, discharge, increased blood in urine. May take Tylenol or Motrin OTC for pain relief or consider AZO if no contraindications   Keep follow up with urologist.  BP is high. Keep taking meds. If consistent readings >140/90 please follow up with PCP/cardiology. If severe headaches, light headedness, vomiting, numbness/tingling, chest pain, shortness of  breath, racing heart call 911 or have someone take you to the ER

## 2023-12-18 NOTE — ED Triage Notes (Signed)
 Patient c/o urinary frequency and dysuria that started this morning.  Patient sees a Insurance underwriter for her kidney failure and has an appointment next Monday. Patient denies fevers.

## 2023-12-18 NOTE — ED Provider Notes (Signed)
 [facility]    CSN: [id number] Arrival date & time: 12/18/23  1813      History   Chief Complaint Chief Complaint  Patient presents with   Urinary Frequency   Dysuria    HPI Stacey Snyder is a 89 y.o. female presenting for dysuria, urinary frequency/urgency that began this morning. She reports pain shooting through her whole body when she urinates. Denies fever, fatigue, abdominal pain, flank pain, gross hematuria, vaginal discharge. She is confident she has a urinary tract infection and has been to this urgent care many times for UTIs. Each time her urine culture has been positive. She has an appointment in 3 days with her urologist.  Of note, patient has a history of hypertension and BP is high today at 200/63. She takes Diltiazem  and Valsartan . She believes her BP is so high today because she is in pain and worked up about not feeling well. No headaches, dizziness, vomiting, numbness/tingling, weakness, confusion, chest pain, palpitations or shortness of breath.  HPI  Past Medical History:  Diagnosis Date   Anemia    Diverticulosis    Fibrocystic breast disease    GERD (gastroesophageal reflux disease)    Glaucoma 12/04/2020   History of kidney stones    Hypercholesterolemia    Hypertension    Hypertension    IBS (irritable bowel syndrome)    Inflammatory arthritis    positive anti-CCP abs, s/p prednisone, MTX, Remicade    Meningioma (HCC)    Nephrolithiasis    Osteopenia    GI upset with Fosamax   Pancreatitis 2007   s/p ERCP   Pancreatitis    PONV (postoperative nausea and vomiting)    Renal insufficiency    Spinal stenosis of lumbosacral region     Patient Active Problem List   Diagnosis Date Noted   Boil 05/31/2023   Nasal drainage 05/31/2023   Constipation 02/20/2023   Hypermagnesemia 02/20/2023   Lumbar stenosis with neurogenic claudication 02/09/2023   Pre-op evaluation 01/25/2023   Low back pain 12/26/2022   Hand numbness  10/11/2022   Hoarseness 05/27/2022   Right knee pain 08/11/2021   Meningioma (HCC) 08/11/2021   Arm lesion 06/22/2021   Headache 04/12/2021   Glaucoma 12/04/2020   Lower GI bleed 12/03/2020   Hypokalemia 12/03/2020   Pancreas cyst 09/12/2020   Aortic atherosclerosis (HCC) 07/30/2020   Diarrhea 07/10/2020   Right shoulder pain 02/19/2019   Rash 02/19/2019   MGUS (monoclonal gammopathy of unknown significance) 01/19/2018   Cramps, extremity 12/02/2017   Abdominal bruit 01/16/2017   Lumbar stenosis 08/18/2016   Abdominal pain 12/03/2015   Abnormal liver function tests 10/18/2015   Anemia 10/18/2015   Hyponatremia 10/07/2015   Lip lesion 04/23/2015   Scalp lesion 04/23/2015   Loss of weight 04/23/2015   Stress 04/23/2015   Left shoulder pain 12/20/2014   UTI (urinary tract infection) 08/20/2014   Health care maintenance 08/20/2014   Skin lesion 04/17/2014   Fatigue 04/13/2014   Shoulder pain, right 12/17/2013   Trigger finger 08/21/2013   Left hip pain 04/17/2013   Acid reflux 04/03/2012   Rheumatoid arthritis (HCC) 03/30/2012   Diverticulosis 03/30/2012   Osteopenia 03/30/2012   Essential hypertension 03/30/2012   Hypercholesterolemia 03/30/2012    Past Surgical History:  Procedure Laterality Date   ABDOMINAL HYSTERECTOMY  1993   ovaries not removed   BREAST BIOPSY Left 1984   negative   BREAST CYST EXCISION Left 2010   negative   COLONOSCOPY N/A 12/05/2020  Procedure: COLONOSCOPY;  Surgeon: Maryruth Ole DASEN, MD;  Location: [facility];  Service: Endoscopy;  Laterality: N/A;   EYE SURGERY     cataract bilat   LITHOTRIPSY     LUMBAR LAMINECTOMY  1983   LUMBAR LAMINECTOMY/DECOMPRESSION MICRODISCECTOMY N/A 08/18/2016   Procedure: LUMBAR LAMINECTOMY/DECOMPRESSION MICRODISCECTOMY 1 LEVEL;  Surgeon: Norleen JAYSON Shine, MD;  Location: [facility] ORS;  Service: Neurosurgery;  Laterality: N/A;  L4-5 Laminectomy, MIS, L5 foraminotomy   LUMBAR LAMINECTOMY/DECOMPRESSION  MICRODISCECTOMY Left 02/09/2023   Procedure: L2-4 POSTERIOR SPINAL DECOMPRESSION;  Surgeon: Clois Fret, MD;  Location: [facility] ORS;  Service: Neurosurgery;  Laterality: Left;   TRIGGER FINGER RELEASE     right ring finger    OB History   No obstetric history on file.      Home Medications    Prior to Admission medications   Medication Sig Start Date End Date Taking? Authorizing Provider  cefdinir  (OMNICEF ) 300 MG capsule Take 1 capsule (300 mg total) by mouth daily for 7 days. 12/18/23 12/25/23 Yes Arvis Jolan NOVAK, PA-C  acetaminophen  (TYLENOL ) 500 MG tablet Take 1,000 mg by mouth in the morning and at bedtime.    [provider]  Cholecalciferol  (VITAMIN D ) 2000 UNITS tablet Take 2,000 Units by mouth daily.    [provider]  diltiazem  (DILACOR XR ) 240 MG 24 hr capsule TAKE 1 CAPSULE DAILY 08/03/23   Glendia Shad, MD  dorzolamide -timolol  (COSOPT ) 22.3-6.8 MG/ML ophthalmic solution INSTILL 1 DROP INTO EACH EYE TWICE DAILY 12/17/17   [provider]  doxycycline  (VIBRA -TABS) 100 MG tablet Take 1 tablet (100 mg total) by mouth 2 (two) times daily. 06/03/23   Glendia Shad, MD  fluticasone  (FLONASE ) 50 MCG/ACT nasal spray Place 2 sprays into both nostrils daily. 05/25/23   Glendia Shad, MD  gabapentin  (NEURONTIN ) 300 MG capsule TAKE 1 CAPSULE AT BEDTIME 10/27/23   Glendia Shad, MD  InFLIXimab  (REMICADE  IV) Inject into the vein every 6 (six) weeks. Per Dr Maryl    [provider]  latanoprost  (XALATAN ) 0.005 % ophthalmic solution Place 1 drop into both eyes at bedtime. 01/18/18   [provider]  lovastatin  (MEVACOR ) 40 MG tablet TAKE 1 TABLET DAILY 08/03/23   Glendia Shad, MD  methotrexate (RHEUMATREX) 2.5 MG tablet Take 6 tablets by mouth once a week. 04/08/23   [provider]  omeprazole  (PRILOSEC) 20 MG capsule TAKE 1 CAPSULE DAILY 06/24/23   Glendia Shad, MD  phenazopyridine  (PYRIDIUM ) 200 MG tablet Take 1 tablet (200  mg total) by mouth 3 (three) times daily. 07/20/23   Brimage, Vondra, DO  polyethylene glycol (MIRALAX  / GLYCOLAX ) 17 g packet Take 17 g by mouth daily.    [provider]  prednisoLONE  acetate (PRED FORTE ) 1 % ophthalmic suspension Place 1 drop into both eyes daily. am    [provider]  Probiotic Product (ALIGN) 4 MG CAPS Take one capsule daily 03/11/19   Glendia Shad, MD  psyllium (METAMUCIL) 58.6 % packet Take 1 packet by mouth daily.    [provider]  valsartan  (DIOVAN ) 160 MG tablet TAKE 1 TABLET DAILY 06/29/23   Glendia Shad, MD    Family History Family History  Problem Relation Age of Onset   Ovarian cancer Mother    Renal Disease Mother    Heart disease Mother    Diabetes Mother    Hypertension Mother    Heart disease Father    Breast cancer Neg Hx     Social History Social History  Tobacco Use   Smoking status: Never   Smokeless tobacco: Never  Vaping Use   Vaping status: Never Used  Substance Use Topics   Alcohol use: No    Alcohol/week: 0.0 standard drinks of alcohol   Drug use: No     Allergies   Carisoprodol , Metronidazole , Codeine, Nsaids, Skelaxin [metaxalone], Tenormin [atenolol], and Tramadol    Review of Systems Review of Systems  Constitutional:  Negative for chills, fatigue and fever.  Gastrointestinal:  Negative for abdominal pain, diarrhea, nausea and vomiting.  Genitourinary:  Positive for dysuria, frequency and urgency. Negative for decreased urine volume, flank pain, hematuria, pelvic pain, vaginal bleeding, vaginal discharge and vaginal pain.  Musculoskeletal:  Negative for back pain.  Skin:  Negative for rash.     Physical Exam Triage Vital Signs ED Triage Vitals  Encounter Vitals Group     BP      Girls Systolic BP Percentile      Girls Diastolic BP Percentile      Boys Systolic BP Percentile      Boys Diastolic BP Percentile      Pulse      Resp      Temp      Temp src      SpO2      Weight       Height      Head Circumference      Peak Flow      Pain Score      Pain Loc      Pain Education      Exclude from Growth Chart    No data found.  Updated Vital Signs BP (!) 191/50 (BP Location: Left Arm)   Pulse 62   Temp 98 F (36.7 C) (Oral)   Resp 14   Ht 5' 1 (1.549 m)   Wt 95 lb 14.4 oz (43.5 kg)   SpO2 97%   BMI 18.12 kg/m   BP Readings from Last 3 Encounters:  12/18/23 (!) 191/50  09/24/23 122/70  07/20/23 (!) 184/83       Physical Exam Vitals and nursing note reviewed.  Constitutional:      General: She is not in acute distress.    Appearance: Normal appearance. She is not ill-appearing or toxic-appearing.  HENT:     Head: Normocephalic and atraumatic.     Nose: Nose normal.     Mouth/Throat:     Mouth: Mucous membranes are moist.     Pharynx: Oropharynx is clear.  Eyes:     General: No scleral icterus.       Right eye: No discharge.        Left eye: No discharge.     Conjunctiva/sclera: Conjunctivae normal.  Cardiovascular:     Rate and Rhythm: Normal rate and regular rhythm.     Heart sounds: Normal heart sounds.  Pulmonary:     Effort: Pulmonary effort is normal. No respiratory distress.     Breath sounds: Normal breath sounds.  Musculoskeletal:     Cervical back: Neck supple.  Skin:    General: Skin is dry.  Neurological:     General: No focal deficit present.     Mental Status: She is alert and oriented to person, place, and time. Mental status is at baseline.     Cranial Nerves: No cranial nerve deficit (grossly intact).     Motor: No weakness.     Gait: Gait normal.  Psychiatric:        Mood and  Affect: Mood normal.        Behavior: Behavior normal.      UC Treatments / Results  Labs (all labs ordered are listed, but only abnormal results are displayed) Labs Reviewed  URINALYSIS, W/ REFLEX TO CULTURE (INFECTION SUSPECTED) - Abnormal; Notable for the following components:      Result Value   Hgb urine dipstick TRACE (*)     Bacteria, UA FEW (*)    All other components within normal limits  URINE CULTURE  Basic Metabolic Panel (BMET) Order: [id number]            Component Ref Range & Units (hover) 1 mo ago (11/16/23) 1 mo ago (11/02/23) 2 mo ago (09/24/23) 6 mo ago (05/25/23) 7 mo ago (05/18/23) 9 mo ago (03/23/23) 10 mo ago (02/20/23)  Sodium 137 136 137  136 138 133 Low   Potassium 4.0 3.6 3.6 3.8 3.3 Low  3.6 4.0  Chloride 101 99 102  101 101 98  CO2 28 29 30  29 26 26   Glucose, Bld 112 High  83 94  107 High  110 High  96  BUN 21 20 19  16 18 20   Creatinine, Ser 1.06 1.01 0.89  0.83 0.75 0.84  GFR 46.87 Low  49.68 Low  CM 57.87 Low  CM  63.08 CM 71.32 CM 62.29 CM  Comment: Calculated using the CKD-EPI Creatinine Equation (2021)  Calcium 9.7 9.5 9.4  9.0 9.1 9.5  Resulting Agency [place] [facility] [place] [facility] [place] [facility] [place] [facility] [place] [facility] [place] [facility] [place] [facility]        Specimen Collected: 11/16/23 10:35 Last Resulted: 11/16/23 14:43   25 mL/min Creatinine clearance, original Cockcroft-Gault   EKG   Radiology No results found.  Procedures Procedures (including critical care time)  Medications Ordered in UC Medications - No data to display  Initial Impression / Assessment and Plan / UC Course  I have reviewed the triage vital signs and the nursing notes.  Pertinent labs & imaging results that were available during my care of the patient were reviewed by me and considered in my medical decision making (see chart for details).    89 y/o female with CKD presents for UTI symptoms today. History of recurrent UTIs.  UA obtained. Trace hemoglobin and few bacteria. Urine to be sent for culture.   Discussed results with patient. Advised her that she does not have a clear UTI. However, I did review previous urine samples/cultures and each time she has been to the urgent care, she has had a positive urine culture. Since she is symptomatic and taking her history into  account, will treat and amend treatment based on culture.  Dosing for Cefdinir . Patient's creatinine clearance is 25 mL/min. Per up today, dosing for Cefdinir --CrCl <30 mL/minute: 300 mg once daily.  BP high at 200/63. Repeat 191/50. Patient denies red flags. Exam benign. Continue meds. Follow up with PCP. ED precautions reviewed.   Final Clinical Impressions(s) / UC Diagnoses   Final diagnoses:  Acute cystitis without hematuria  Essential hypertension  Renal insufficiency     Discharge Instructions      UTI: Based on either symptoms or urinalysis, you may have a urinary tract infection. We will send the urine for culture and call with results in a few days. Begin antibiotics at this time. Your symptoms should be much improved over the next 2-3 days. Increase rest and fluid intake. If for some reason symptoms are worsening or not improving after a  couple of days or the urine culture determines the antibiotics you are taking will not treat the infection, the antibiotics may be changed. Return or go to ER for fever, back pain, worsening urinary pain, discharge, increased blood in urine. May take Tylenol or Motrin OTC for pain relief or consider AZO if no contraindications   Keep follow up with urologist.  BP is high. Keep taking meds. If consistent readings >140/90 please follow up with PCP/cardiology. If severe headaches, light headedness, vomiting, numbness/tingling, chest pain, shortness of  breath, racing heart call 911 or have someone take you to the ER     ED Prescriptions     Medication Sig Dispense Auth. Provider   cefdinir (OMNICEF) 300 MG capsule Take 1 capsule (300 mg total) by mouth daily for 7 days. 7 capsule Arvis Jolan NOVAK, PA-C      PDMP not reviewed this encounter.   Arvis Jolan NOVAK, PA-C 12/18/23 [phone]

## 2023-12-20 LAB — URINE CULTURE: Culture: 30000 — AB

## 2023-12-21 ENCOUNTER — Ambulatory Visit (INDEPENDENT_AMBULATORY_CARE_PROVIDER_SITE_OTHER): Admitting: Urology

## 2023-12-21 ENCOUNTER — Ambulatory Visit (HOSPITAL_COMMUNITY): Payer: Self-pay

## 2023-12-21 ENCOUNTER — Encounter: Payer: Self-pay | Admitting: Urology

## 2023-12-21 VITALS — BP 177/63 | HR 76 | Ht 62.0 in | Wt 92.5 lb

## 2023-12-21 DIAGNOSIS — N390 Urinary tract infection, site not specified: Secondary | ICD-10-CM | POA: Diagnosis not present

## 2023-12-21 DIAGNOSIS — R3129 Other microscopic hematuria: Secondary | ICD-10-CM | POA: Diagnosis not present

## 2023-12-21 NOTE — Progress Notes (Addendum)
 12/21/2023 1:13 PM   Stacey Snyder 04-Dec-1932 [id number]  Referring provider: Glendia Shad, MD [address] [place],  [place] [address]  Chief Complaint  Patient presents with   Hematuria    HPI: Stacey Snyder is a 89 y.o. female referred for evaluation and management of hematuria  Urinalysis: 11/16/2023 with 3-6 RBCs Gross hematuria: Negative Associated lower urinary tract symptoms: Recently seen in urgent care for dysuria, frequency and urgency and urine culture positive for E. coli.  Presently on antibiotics History recurrent UTI Pain: Denied flank, abdominal or pelvic pain Prior urologic history: Stone disease Blood thinners/antiplatelet meds: Negative Tobacco history: Negative   PMH: Past Medical History:  Diagnosis Date   Anemia    Diverticulosis    Fibrocystic breast disease    GERD (gastroesophageal reflux disease)    Glaucoma 12/04/2020   History of kidney stones    Hypercholesterolemia    Hypertension    Hypertension    IBS (irritable bowel syndrome)    Inflammatory arthritis    positive anti-CCP abs, s/p prednisone, MTX, Remicade    Meningioma (HCC)    Nephrolithiasis    Osteopenia    GI upset with Fosamax   Pancreatitis 2007   s/p ERCP   Pancreatitis    PONV (postoperative nausea and vomiting)    Renal insufficiency    Spinal stenosis of lumbosacral region     Surgical History: Past Surgical History:  Procedure Laterality Date   ABDOMINAL HYSTERECTOMY  1993   ovaries not removed   BREAST BIOPSY Left 1984   negative   BREAST CYST EXCISION Left 2010   negative   COLONOSCOPY N/A 12/05/2020   Procedure: COLONOSCOPY;  Surgeon: Maryruth Ole DASEN, MD;  Location: [facility]OSCOPY;  Service: Endoscopy;  Laterality: N/A;   EYE SURGERY     cataract bilat   LITHOTRIPSY     LUMBAR LAMINECTOMY  1983   LUMBAR LAMINECTOMY/DECOMPRESSION MICRODISCECTOMY N/A 08/18/2016   Procedure: LUMBAR  LAMINECTOMY/DECOMPRESSION MICRODISCECTOMY 1 LEVEL;  Surgeon: Norleen JAYSON Shine, MD;  Location: [facility] ORS;  Service: Neurosurgery;  Laterality: N/A;  L4-5 Laminectomy, MIS, L5 foraminotomy   LUMBAR LAMINECTOMY/DECOMPRESSION MICRODISCECTOMY Left 02/09/2023   Procedure: L2-4 POSTERIOR SPINAL DECOMPRESSION;  Surgeon: Clois Fret, MD;  Location: [facility] OR[facility];  Service: Neurosurgery;  Laterality: Left;   TRIGGER FINGER RELEASE     right ring finger    Home Medications:  Allergies as of 12/21/2023       Reactions   Carisoprodol  Hives   Whelps all over body and itchy eyes Whelps all over body and itchy eyes   Metronidazole  Swelling, Other (See Comments), Rash   Caused tongue irritation and swelling Caused tongue irritation and swelling   Codeine Nausea Only   Nsaids    Patient to avoid based on kidney function   Skelaxin [metaxalone] Swelling   Tenormin [atenolol] Other (See Comments)   Questionable swelling   Tramadol  Nausea And Vomiting   Pt states 07/11/16 she is okay as long as she eats when taking it        Medication List        Accurate as of December 21, 2023  1:13 PM. If you have any questions, ask your nurse or doctor.          acetaminophen  500 MG tablet Commonly known as: TYLENOL  Take 1,000 mg by mouth in the morning and at bedtime.   Align 4 MG Caps Take one capsule daily   cefdinir  300 MG capsule Commonly known as:  OMNICEF  Take 1 capsule (300 mg total) by mouth daily for 7 days.   diltiazem  240 MG 24 hr capsule Commonly known as: DILACOR XR  TAKE 1 CAPSULE DAILY   dorzolamide -timolol  2-0.5 % ophthalmic solution Commonly known as: COSOPT  INSTILL 1 DROP INTO EACH EYE TWICE DAILY   doxycycline  100 MG tablet Commonly known as: VIBRA -TABS Take 1 tablet (100 mg total) by mouth 2 (two) times daily.   fluticasone  50 MCG/ACT nasal spray Commonly known as: FLONASE  Place 2 sprays into both nostrils daily.   gabapentin  300 MG capsule Commonly known as:  NEURONTIN  TAKE 1 CAPSULE AT BEDTIME   latanoprost  0.005 % ophthalmic solution Commonly known as: XALATAN  Place 1 drop into both eyes at bedtime.   lovastatin  40 MG tablet Commonly known as: MEVACOR  TAKE 1 TABLET DAILY   methotrexate 2.5 MG tablet Commonly known as: RHEUMATREX Take 6 tablets by mouth once a week.   omeprazole  20 MG capsule Commonly known as: PRILOSEC TAKE 1 CAPSULE DAILY   phenazopyridine  200 MG tablet Commonly known as: PYRIDIUM  Take 1 tablet (200 mg total) by mouth 3 (three) times daily.   polyethylene glycol 17 g packet Commonly known as: MIRALAX  / GLYCOLAX  Take 17 g by mouth daily.   prednisoLONE  acetate 1 % ophthalmic suspension Commonly known as: PRED FORTE  Place 1 drop into both eyes daily. am   psyllium 58.6 % packet Commonly known as: METAMUCIL Take 1 packet by mouth daily.   REMICADE  IV Inject into the vein every 6 (six) weeks. Per Dr Maryl   valsartan  160 MG tablet Commonly known as: DIOVAN  TAKE 1 TABLET DAILY   Vitamin D  50 MCG (2000 UT) tablet Take 2,000 Units by mouth daily.        Allergies:  Allergies  Allergen Reactions   Carisoprodol  Hives    Whelps all over body and itchy eyes Whelps all over body and itchy eyes   Metronidazole  Swelling, Other (See Comments) and Rash    Caused tongue irritation and swelling Caused tongue irritation and swelling   Codeine Nausea Only   Nsaids     Patient to avoid based on kidney function   Skelaxin [Metaxalone] Swelling   Tenormin [Atenolol] Other (See Comments)    Questionable swelling   Tramadol  Nausea And Vomiting    Pt states 07/11/16 she is okay as long as she eats when taking it    Family History: Family History  Problem Relation Age of Onset   Ovarian cancer Mother    Renal Disease Mother    Heart disease Mother    Diabetes Mother    Hypertension Mother    Heart disease Father    Breast cancer Neg Hx     Social History:  reports that she has never smoked. She has  never used smokeless tobacco. She reports that she does not drink alcohol and does not use drugs.   Physical Exam: BP (!) 177/63 (BP Location: Left Arm, Patient Position: Sitting, Cuff Size: Normal)   Pulse 76   Ht 5' 2 (1.575 m)   Wt 92 lb 8 oz (42 kg)   BMI 16.92 kg/m   Constitutional:  Alert and oriented, No acute distress. HEENT: [place] AT. Respiratory: Normal respiratory effort, no increased work of breathing. GU: No CVA tenderness Psychiatric: Normal mood and affect.  Laboratory Data:  Urinalysis Unable to to give a specimen today   Pertinent Imaging: CT abdomen/pelvis with contrast 02/14/2023 showed no upper tract abnormalities  Assessment & Plan:    1.  Microhematuria AUA risk stratification: High We discussed the recommended evaluation of high risk hematuria which consist of CT urogram and cystoscopy.   Recent CT abdomen pelvis with contrast negative and will hold off on CT urogram at this time Cystoscopy was scheduled All questions were answered   Glendia JAYSON Barba, MD  [facility] [address], [address] [place], [place] [address] [phone]

## 2023-12-21 NOTE — Progress Notes (Signed)
 Please bring back urine sample for your next scheduled appointment, or you can provide one at time of visit. Thank you.

## 2023-12-28 DIAGNOSIS — M0579 Rheumatoid arthritis with rheumatoid factor of multiple sites without organ or systems involvement: Secondary | ICD-10-CM | POA: Diagnosis not present

## 2024-02-22 DIAGNOSIS — M0579 Rheumatoid arthritis with rheumatoid factor of multiple sites without organ or systems involvement: Secondary | ICD-10-CM | POA: Diagnosis not present

## 2024-03-18 ENCOUNTER — Ambulatory Visit: Admitting: Internal Medicine

## 2024-03-18 ENCOUNTER — Encounter: Payer: Self-pay | Admitting: Internal Medicine

## 2024-03-18 VITALS — BP 144/60 | HR 55 | Temp 98.3°F | Ht 62.0 in | Wt 92.2 lb

## 2024-03-18 DIAGNOSIS — D329 Benign neoplasm of meninges, unspecified: Secondary | ICD-10-CM

## 2024-03-18 DIAGNOSIS — D649 Anemia, unspecified: Secondary | ICD-10-CM | POA: Diagnosis not present

## 2024-03-18 DIAGNOSIS — M069 Rheumatoid arthritis, unspecified: Secondary | ICD-10-CM | POA: Diagnosis not present

## 2024-03-18 DIAGNOSIS — N289 Disorder of kidney and ureter, unspecified: Secondary | ICD-10-CM | POA: Diagnosis not present

## 2024-03-18 DIAGNOSIS — D472 Monoclonal gammopathy: Secondary | ICD-10-CM | POA: Diagnosis not present

## 2024-03-18 DIAGNOSIS — I10 Essential (primary) hypertension: Secondary | ICD-10-CM

## 2024-03-18 DIAGNOSIS — Z23 Encounter for immunization: Secondary | ICD-10-CM | POA: Diagnosis not present

## 2024-03-18 DIAGNOSIS — E7800 Pure hypercholesterolemia, unspecified: Secondary | ICD-10-CM

## 2024-03-18 LAB — CBC WITH DIFFERENTIAL/PLATELET
Basophils Absolute: 0.1 K/uL (ref 0.0–0.1)
Basophils Relative: 1.3 % (ref 0.0–3.0)
Eosinophils Absolute: 0.1 K/uL (ref 0.0–0.7)
Eosinophils Relative: 2 % (ref 0.0–5.0)
HCT: 33.6 % — ABNORMAL LOW (ref 36.0–46.0)
Hemoglobin: 11.3 g/dL — ABNORMAL LOW (ref 12.0–15.0)
Lymphocytes Relative: 23.4 % (ref 12.0–46.0)
Lymphs Abs: 1.3 K/uL (ref 0.7–4.0)
MCHC: 33.7 g/dL (ref 30.0–36.0)
MCV: 94 fl (ref 78.0–100.0)
Monocytes Absolute: 0.4 K/uL (ref 0.1–1.0)
Monocytes Relative: 6.7 % (ref 3.0–12.0)
Neutro Abs: 3.6 K/uL (ref 1.4–7.7)
Neutrophils Relative %: 66.6 % (ref 43.0–77.0)
Platelets: 257 K/uL (ref 150.0–400.0)
RBC: 3.57 Mil/uL — ABNORMAL LOW (ref 3.87–5.11)
RDW: 14.7 % (ref 11.5–15.5)
WBC: 5.4 K/uL (ref 4.0–10.5)

## 2024-03-18 LAB — HEPATIC FUNCTION PANEL
ALT: 23 U/L (ref 0–35)
AST: 34 U/L (ref 0–37)
Albumin: 4.1 g/dL (ref 3.5–5.2)
Alkaline Phosphatase: 98 U/L (ref 39–117)
Bilirubin, Direct: 0.1 mg/dL (ref 0.0–0.3)
Total Bilirubin: 0.6 mg/dL (ref 0.2–1.2)
Total Protein: 6.8 g/dL (ref 6.0–8.3)

## 2024-03-18 LAB — BASIC METABOLIC PANEL WITH GFR
BUN: 21 mg/dL (ref 6–23)
CO2: 30 meq/L (ref 19–32)
Calcium: 9.2 mg/dL (ref 8.4–10.5)
Chloride: 98 meq/L (ref 96–112)
Creatinine, Ser: 1.03 mg/dL (ref 0.40–1.20)
GFR: 48.4 mL/min — ABNORMAL LOW (ref >60.00)
Glucose, Bld: 92 mg/dL (ref 70–99)
Potassium: 4.2 meq/L (ref 3.5–5.1)
Sodium: 136 meq/L (ref 135–145)

## 2024-03-18 LAB — LIPID PANEL
Cholesterol: 160 mg/dL (ref 0–200)
HDL: 54.6 mg/dL (ref >39.00)
LDL Cholesterol: 80 mg/dL (ref 0–99)
NonHDL: 105.05
Total CHOL/HDL Ratio: 3
Triglycerides: 125 mg/dL (ref 0.0–149.0)
VLDL: 25 mg/dL (ref 0.0–40.0)

## 2024-03-18 MED ORDER — GABAPENTIN 100 MG PO CAPS
ORAL_CAPSULE | ORAL | 3 refills | Status: AC
Start: 2024-03-18 — End: ?

## 2024-03-18 NOTE — Progress Notes (Signed)
 Subjective:    Patient ID: Stacey Snyder, female    DOB: [birth year], 89 y.o.   MRN: [id number]  Patient here for  Chief Complaint  Patient presents with   Medical Management of Chronic Issues    HPI Here for a scheduled follow up.  Admitted 02/09/23 - 02/10/23 - with lumbar stenosis with neurogenic claudication. Is status post L2-4 lumbar decompression. Seeing Dr Tobie for RA. Receiving remicade  infusion.  continues on methotrexate. continues on diovan  and diltiazem . F/u OA right knee - voltaren gel.  Saw Dr Twylla - 12/21/23 - gross hematuria. Recommended CT urogram and cystoscopy. Recent CT abd/pelvis - so decided to hold on CT urogram. Is planning cystoscopy.  Breathing stable. Taking miralax  and metamucil to keep bowels regular/stable. Occasionally will have some GI issues, but tries to control with diet adjustment. Overall feels  - stable.    Past Medical History:  Diagnosis Date   Anemia    Diverticulosis    Fibrocystic breast disease    GERD (gastroesophageal reflux disease)    Glaucoma 12/04/2020   History of kidney stones    Hypercholesterolemia    Hypertension    Hypertension    IBS (irritable bowel syndrome)    Inflammatory arthritis    positive anti-CCP abs, s/p prednisone, MTX, Remicade    Meningioma (HCC)    Nephrolithiasis    Osteopenia    GI upset with Fosamax   Pancreatitis 2007   s/p ERCP   Pancreatitis    PONV (postoperative nausea and vomiting)    Renal insufficiency    Spinal stenosis of lumbosacral region    Past Surgical History:  Procedure Laterality Date   ABDOMINAL HYSTERECTOMY  1993   ovaries not removed   BREAST BIOPSY Left 1984   negative   BREAST CYST EXCISION Left 2010   negative   COLONOSCOPY N/A 12/05/2020   Procedure: COLONOSCOPY;  Surgeon: Maryruth Ole DASEN, MD;  Location: [facility];  Service: Endoscopy;  Laterality: N/A;   EYE SURGERY     cataract bilat   LITHOTRIPSY     LUMBAR LAMINECTOMY  1983   LUMBAR  LAMINECTOMY/DECOMPRESSION MICRODISCECTOMY N/A 08/18/2016   Procedure: LUMBAR LAMINECTOMY/DECOMPRESSION MICRODISCECTOMY 1 LEVEL;  Surgeon: Norleen JAYSON Shine, MD;  Location: [facility];  Service: Neurosurgery;  Laterality: N/A;  L4-5 Laminectomy, MIS, L5 foraminotomy   LUMBAR LAMINECTOMY/DECOMPRESSION MICRODISCECTOMY Left 02/09/2023   Procedure: L2-4 POSTERIOR SPINAL DECOMPRESSION;  Surgeon: Clois Fret, MD;  Location: [facility] OR[facility];  Service: Neurosurgery;  Laterality: Left;   TRIGGER FINGER RELEASE     right ring finger   Family History  Problem Relation Age of Onset   Ovarian cancer Mother    Renal Disease Mother    Heart disease Mother    Diabetes Mother    Hypertension Mother    Heart disease Father    Breast cancer Neg Hx    Social History   Socioeconomic History   Marital status: Widowed    Spouse name: Not on file   Number of children: 4   Years of education: Not on file   Highest education level: Not on file  Occupational History   Not on file  Tobacco Use   Smoking status: Never   Smokeless tobacco: Never  Vaping Use   Vaping status: Never Used  Substance and Sexual Activity   Alcohol use: No    Alcohol/week: 0.0 standard drinks of alcohol   Drug use: No   Sexual activity: Not on file  Other Topics Concern  Not on file  Social History Narrative   [place] grandson is living with her   Social Drivers of Health   Financial Resource Strain: Not on file  Food Insecurity: No Food Insecurity (02/09/2023)   Hunger Vital Sign    Worried About Running Out of Food in the Last Year: Never true    Ran Out of Food in the Last Year: Never true  Transportation Needs: No Transportation Needs (02/09/2023)   PRAPARE - Administrator, Civil Service (Medical): No    Lack of Transportation (Non-Medical): No  Physical Activity: Not on file  Stress: Not on file  Social Connections: Not on file     Review of Systems  Constitutional:  Negative for appetite change and  unexpected weight change.  HENT:  Negative for congestion and sinus pressure.   Respiratory:  Negative for cough, chest tightness and shortness of breath.   Cardiovascular:  Negative for chest pain, palpitations and leg swelling.  Gastrointestinal:  Negative for vomiting.       Keeps bowels regular with metamucil / miralax .  No increased abdominal pain.   Genitourinary:  Negative for difficulty urinating and dysuria.  Musculoskeletal:  Negative for myalgias.       Seeing rheumatology - joints stable.   Skin:  Negative for color change and rash.  Neurological:  Negative for dizziness and headaches.  Psychiatric/Behavioral:  Negative for agitation and dysphoric mood.        Objective:     BP (!) 144/60   Pulse (!) 55   Temp 98.3 F (36.8 C) (Oral)   Ht 5' 2 (1.575 m)   Wt 92 lb 3.2 oz (41.8 kg)   SpO2 97%   BMI 16.86 kg/m  Wt Readings from Last 3 Encounters:  03/18/24 92 lb 3.2 oz (41.8 kg)  12/21/23 92 lb 8 oz (42 kg)  12/18/23 95 lb 14.4 oz (43.5 kg)    Physical Exam Vitals reviewed.  Constitutional:      General: She is not in acute distress.    Appearance: Normal appearance.  HENT:     Head: Normocephalic and atraumatic.     Right Ear: External ear normal.     Left Ear: External ear normal.     Mouth/Throat:     Pharynx: No oropharyngeal exudate or posterior oropharyngeal erythema.  Eyes:     General: No scleral icterus.       Right eye: No discharge.        Left eye: No discharge.     Conjunctiva/sclera: Conjunctivae normal.  Neck:     Thyroid : No thyromegaly.  Cardiovascular:     Rate and Rhythm: Normal rate and regular rhythm.  Pulmonary:     Effort: No respiratory distress.     Breath sounds: Normal breath sounds. No wheezing.  Abdominal:     General: Bowel sounds are normal.     Palpations: Abdomen is soft.     Tenderness: There is no abdominal tenderness.  Musculoskeletal:        General: No swelling or tenderness.     Cervical back: Neck supple.  No tenderness.  Lymphadenopathy:     Cervical: No cervical adenopathy.  Skin:    Findings: No erythema or rash.  Neurological:     Mental Status: She is alert.  Psychiatric:        Mood and Affect: Mood normal.        Behavior: Behavior normal.  Outpatient Encounter Medications as of 03/18/2024  Medication Sig   acetaminophen  (TYLENOL ) 500 MG tablet Take 1,000 mg by mouth in the morning and at bedtime.   Cholecalciferol  (VITAMIN D ) 2000 UNITS tablet Take 2,000 Units by mouth daily.   diltiazem  (DILACOR XR ) 240 MG 24 hr capsule TAKE 1 CAPSULE DAILY   dorzolamide -timolol  (COSOPT ) 22.3-6.8 MG/ML ophthalmic solution INSTILL 1 DROP INTO EACH EYE TWICE DAILY   doxycycline  (VIBRA -TABS) 100 MG tablet Take 1 tablet (100 mg total) by mouth 2 (two) times daily.   fluticasone  (FLONASE ) 50 MCG/ACT nasal spray Place 2 sprays into both nostrils daily.   folic acid  (FOLVITE ) 1 MG tablet Take 1 mg by mouth.   gabapentin  (NEURONTIN ) 100 MG capsule Take one to two capsules before bed. Cancel the prescription for the 300mg  capsules.   InFLIXimab  (REMICADE  IV) Inject into the vein every 6 (six) weeks. Per Dr Kernodle   latanoprost  (XALATAN ) 0.005 % ophthalmic solution Place 1 drop into both eyes at bedtime.   lovastatin  (MEVACOR ) 40 MG tablet TAKE 1 TABLET DAILY   methotrexate (RHEUMATREX) 2.5 MG tablet Take 6 tablets by mouth once a week.   omeprazole  (PRILOSEC) 20 MG capsule TAKE 1 CAPSULE DAILY   phenazopyridine  (PYRIDIUM ) 200 MG tablet Take 1 tablet (200 mg total) by mouth 3 (three) times daily.   polyethylene glycol (MIRALAX  / GLYCOLAX ) 17 g packet Take 17 g by mouth daily.   prednisoLONE  acetate (PRED FORTE ) 1 % ophthalmic suspension Place 1 drop into both eyes daily. am   Probiotic Product (ALIGN) 4 MG CAPS Take one capsule daily   psyllium (METAMUCIL) 58.6 % packet Take 1 packet by mouth daily.   valsartan  (DIOVAN ) 160 MG tablet TAKE 1 TABLET DAILY   [DISCONTINUED] gabapentin   (NEURONTIN ) 300 MG capsule TAKE 1 CAPSULE AT BEDTIME   No facility-administered encounter medications on file as of 03/18/2024.     Lab Results  Component Value Date   WBC 5.4 03/18/2024   HGB 11.3 (L) 03/18/2024   HCT 33.6 (L) 03/18/2024   PLT 257.0 03/18/2024   GLUCOSE 92 03/18/2024   CHOL 160 03/18/2024   TRIG 125.0 03/18/2024   HDL 54.60 03/18/2024   LDLCALC 80 03/18/2024   ALT 23 03/18/2024   AST 34 03/18/2024   NA 136 03/18/2024   K 4.2 03/18/2024   CL 98 03/18/2024   CREATININE 1.03 03/18/2024   BUN 21 03/18/2024   CO2 30 03/18/2024   TSH 2.37 09/24/2023   INR 1.0 12/03/2020       Assessment & Plan:  Need for influenza vaccination -     Flu vaccine trivalent PF, 6mos and older(Flulaval,Afluria,Fluarix,Fluzone)  Function kidney decreased -     Basic metabolic panel with GFR; Future  Anemia, unspecified type Assessment & Plan: Follow cbc.   Orders: -     CBC with Differential/Platelet; Future  Hypercholesterolemia Assessment & Plan: Continues on lovastatin . Follow lipid panel.   Orders: -     Lipid panel; Future -     Hepatic function panel; Future  Rheumatoid arthritis, involving unspecified site, unspecified whether rheumatoid factor present [facility]) Assessment & Plan: Followed by rheumatology.  Has been receiving Remicade  and MTX. Stable.    Meningioma [facility]) Assessment & Plan: MRI Brain without Contrast 04/23/2021: 1.3 cm dural-based mass along the anterior falx, likely reflecting a meningioma. No significant mass effect upon the adjacent frontal lobes. No adjacent parenchymal edema. Minimal chronic small-vessel ischemic changes within the cerebral white matter. Mild generalized parenchymal atrophy.  Mild paranasal sinus disease, as described.  Saw neurology (Dr Maree) -  felt was an incidental finding, no signs and symptoms suggestive of pressure, vasogenic edema. did not feel she needed surveillance scans.      Essential hypertension Assessment &  Plan: On diovan  and diltiazem .   Blood pressures as outlined. Continue current medication regimen.  Have her spot check pressures. If persistent elevation will need to adjust. Follow metabolic panel.    MGUS (monoclonal gammopathy of unknown significance) Assessment & Plan: Has declined f/u with hematology.  Myeloma panel (03/2021) - negative M spike.      Other orders -     Gabapentin ; Take one to two capsules before bed. Cancel the prescription for the 300mg  capsules.  Dispense: 60 capsule; Refill: 3     Allena Hamilton, MD

## 2024-03-18 NOTE — Patient Instructions (Signed)
 Stop the 300mg  gabapentin . Start 100mg  capsules per discussion

## 2024-03-21 ENCOUNTER — Other Ambulatory Visit: Payer: Self-pay | Admitting: Internal Medicine

## 2024-03-21 ENCOUNTER — Ambulatory Visit: Payer: Self-pay | Admitting: Internal Medicine

## 2024-03-21 ENCOUNTER — Encounter: Payer: Self-pay | Admitting: Internal Medicine

## 2024-03-21 DIAGNOSIS — D649 Anemia, unspecified: Secondary | ICD-10-CM

## 2024-03-21 NOTE — Assessment & Plan Note (Signed)
 Has declined f/u with hematology.  Myeloma panel (03/2021) - negative M spike.

## 2024-03-21 NOTE — Assessment & Plan Note (Signed)
 MRI Brain without Contrast 04/23/2021: 1.3 cm dural-based mass along the anterior falx, likely reflecting a meningioma. No significant mass effect upon the adjacent frontal lobes. No adjacent parenchymal edema. Minimal chronic small-vessel ischemic changes within the cerebral white matter. Mild generalized parenchymal atrophy. Mild paranasal sinus disease, as described.  Saw neurology (Dr Sherryll Burger) -  felt was an incidental finding, no signs and symptoms suggestive of pressure, vasogenic edema. did not feel she needed surveillance scans.

## 2024-03-21 NOTE — Assessment & Plan Note (Signed)
 Followed by rheumatology.  Has been receiving Remicade  and MTX. Stable.

## 2024-03-21 NOTE — Assessment & Plan Note (Signed)
 On diovan  and diltiazem .   Blood pressures as outlined. Continue current medication regimen.  Have her spot check pressures. If persistent elevation will need to adjust. Follow metabolic panel.

## 2024-03-21 NOTE — Assessment & Plan Note (Signed)
 Follow cbc.

## 2024-03-21 NOTE — Progress Notes (Signed)
Orders placed for f/u labs.  

## 2024-03-21 NOTE — Assessment & Plan Note (Signed)
 Continues on lovastatin . Follow lipid panel.

## 2024-04-05 ENCOUNTER — Other Ambulatory Visit (INDEPENDENT_AMBULATORY_CARE_PROVIDER_SITE_OTHER)

## 2024-04-05 ENCOUNTER — Ambulatory Visit: Admitting: Urology

## 2024-04-05 ENCOUNTER — Encounter: Payer: Self-pay | Admitting: Urology

## 2024-04-05 VITALS — BP 120/80 | HR 74 | Ht 60.0 in | Wt 92.0 lb

## 2024-04-05 DIAGNOSIS — D649 Anemia, unspecified: Secondary | ICD-10-CM

## 2024-04-05 DIAGNOSIS — N390 Urinary tract infection, site not specified: Secondary | ICD-10-CM | POA: Diagnosis not present

## 2024-04-05 DIAGNOSIS — R3129 Other microscopic hematuria: Secondary | ICD-10-CM | POA: Diagnosis not present

## 2024-04-05 LAB — MICROSCOPIC EXAMINATION: WBC, UA: NONE SEEN /HPF (ref 0–5)

## 2024-04-05 LAB — URINALYSIS, COMPLETE
Bilirubin, UA: NEGATIVE
Glucose, UA: NEGATIVE
Ketones, UA: NEGATIVE
Leukocytes,UA: NEGATIVE
Nitrite, UA: NEGATIVE
Protein,UA: NEGATIVE
RBC, UA: NEGATIVE
Specific Gravity, UA: <1.005 — ABNORMAL LOW (ref 1.005–1.030)
Urobilinogen, Ur: 0.2 mg/dL (ref 0.2–1.0)
pH, UA: 6.5 (ref 5.0–7.5)

## 2024-04-05 LAB — CBC WITH DIFFERENTIAL/PLATELET
Basophils Absolute: 0.1 K/uL (ref 0.0–0.1)
Basophils Relative: 1.1 % (ref 0.0–3.0)
Eosinophils Absolute: 0.1 K/uL (ref 0.0–0.7)
Eosinophils Relative: 2.3 % (ref 0.0–5.0)
HCT: 33.2 % — ABNORMAL LOW (ref 36.0–46.0)
Hemoglobin: 11 g/dL — ABNORMAL LOW (ref 12.0–15.0)
Lymphocytes Relative: 21.6 % (ref 12.0–46.0)
Lymphs Abs: 1.3 K/uL (ref 0.7–4.0)
MCHC: 33.2 g/dL (ref 30.0–36.0)
MCV: 93.8 fl (ref 78.0–100.0)
Monocytes Absolute: 1.1 K/uL — ABNORMAL HIGH (ref 0.1–1.0)
Monocytes Relative: 17.5 % — ABNORMAL HIGH (ref 3.0–12.0)
Neutro Abs: 3.6 K/uL (ref 1.4–7.7)
Neutrophils Relative %: 57.5 % (ref 43.0–77.0)
Platelets: 337 K/uL (ref 150.0–400.0)
RBC: 3.54 Mil/uL — ABNORMAL LOW (ref 3.87–5.11)
RDW: 15.6 % — ABNORMAL HIGH (ref 11.5–15.5)
WBC: 6.2 K/uL (ref 4.0–10.5)

## 2024-04-05 LAB — IBC + FERRITIN
Ferritin: 260.9 ng/mL (ref 10.0–291.0)
Iron: 87 ug/dL (ref 42–145)
Saturation Ratios: 38.4 % (ref 20.0–50.0)
TIBC: 226.8 ug/dL — ABNORMAL LOW (ref 250.0–450.0)
Transferrin: 162 mg/dL — ABNORMAL LOW (ref 212.0–360.0)

## 2024-04-05 NOTE — Progress Notes (Signed)
   04/05/24  CC:  Chief Complaint  Patient presents with   Cysto    HPI: Refer to my prior office note 12/21/2023.  Denies gross hematuria.  UA today negative RBCs   Blood pressure 120/80, pulse 74, height 5' (1.524 m), weight 92 lb (41.7 kg). Normal external genitalia with patent urethral meatus; atrophic vaginal mucosa  Cystoscopy Procedure Note  Patient identification was confirmed, informed consent was obtained, and patient was prepped using Betadine solution.  Lidocaine  jelly was administered per urethral meatus.    Procedure: - Flexible cystoscope introduced, without any difficulty.   - Thorough search of the bladder revealed:    normal urethral meatus    normal urothelium    no stones    no ulcers     no tumors    no urethral polyps    no trabeculation  - Ureteral orifices were normal in position and appearance.  Post-Procedure: - Patient tolerated the procedure well  Assessment/ Plan: No bladder mucosal lesions Prior CT negative UA today clear Follow-up prn  Glendia JAYSON Barba, MD

## 2024-04-07 ENCOUNTER — Ambulatory Visit: Payer: Self-pay | Admitting: Internal Medicine

## 2024-04-07 DIAGNOSIS — D649 Anemia, unspecified: Secondary | ICD-10-CM

## 2024-04-19 ENCOUNTER — Other Ambulatory Visit: Payer: Self-pay | Admitting: Internal Medicine

## 2024-07-06 ENCOUNTER — Ambulatory Visit: Admitting: Internal Medicine
